# Patient Record
Sex: Female | Born: 1965 | Race: Black or African American | Hispanic: No | Marital: Single | Smoking: Current every day smoker
Health system: Southern US, Community
[De-identification: ages and names within clinical notes are randomized; demographics above are authoritative.]

## PROBLEM LIST (undated history)

## (undated) DIAGNOSIS — R32 Unspecified urinary incontinence: Secondary | ICD-10-CM

## (undated) DIAGNOSIS — N3000 Acute cystitis without hematuria: Secondary | ICD-10-CM

## (undated) DIAGNOSIS — F32A Depression, unspecified: Secondary | ICD-10-CM

## (undated) DIAGNOSIS — M1990 Unspecified osteoarthritis, unspecified site: Secondary | ICD-10-CM

## (undated) DIAGNOSIS — F329 Major depressive disorder, single episode, unspecified: Secondary | ICD-10-CM

## (undated) DIAGNOSIS — I639 Cerebral infarction, unspecified: Secondary | ICD-10-CM

## (undated) DIAGNOSIS — R159 Full incontinence of feces: Secondary | ICD-10-CM

## (undated) DIAGNOSIS — E119 Type 2 diabetes mellitus without complications: Secondary | ICD-10-CM

## (undated) DIAGNOSIS — R0600 Dyspnea, unspecified: Secondary | ICD-10-CM

## (undated) DIAGNOSIS — I10 Essential (primary) hypertension: Secondary | ICD-10-CM

## (undated) DIAGNOSIS — F419 Anxiety disorder, unspecified: Secondary | ICD-10-CM

## (undated) HISTORY — PX: NO PAST SURGERIES: SHX2092

---

## 2002-02-20 DIAGNOSIS — I639 Cerebral infarction, unspecified: Secondary | ICD-10-CM

## 2002-02-20 HISTORY — DX: Cerebral infarction, unspecified: I63.9

## 2005-12-05 ENCOUNTER — Observation Stay: Payer: Self-pay

## 2006-01-12 ENCOUNTER — Observation Stay: Payer: Self-pay | Admitting: Obstetrics and Gynecology

## 2006-02-02 ENCOUNTER — Inpatient Hospital Stay: Payer: Self-pay | Admitting: Obstetrics and Gynecology

## 2010-12-12 ENCOUNTER — Emergency Department: Payer: Self-pay | Admitting: Emergency Medicine

## 2012-01-12 ENCOUNTER — Emergency Department: Payer: Self-pay | Admitting: Emergency Medicine

## 2012-01-16 ENCOUNTER — Emergency Department: Payer: Self-pay | Admitting: Emergency Medicine

## 2013-11-16 ENCOUNTER — Emergency Department: Payer: Self-pay | Admitting: Emergency Medicine

## 2015-03-11 ENCOUNTER — Emergency Department
Admission: EM | Admit: 2015-03-11 | Discharge: 2015-03-11 | Disposition: A | Discharge status: Home / Self Care | Payer: Medicaid Other | Attending: Emergency Medicine | Admitting: Emergency Medicine

## 2015-03-11 ENCOUNTER — Encounter: Payer: Self-pay | Admitting: Emergency Medicine

## 2015-03-11 DIAGNOSIS — I10 Essential (primary) hypertension: Secondary | ICD-10-CM | POA: Insufficient documentation

## 2015-03-11 DIAGNOSIS — E119 Type 2 diabetes mellitus without complications: Secondary | ICD-10-CM | POA: Insufficient documentation

## 2015-03-11 DIAGNOSIS — M170 Bilateral primary osteoarthritis of knee: Secondary | ICD-10-CM

## 2015-03-11 DIAGNOSIS — M25561 Pain in right knee: Secondary | ICD-10-CM | POA: Diagnosis present

## 2015-03-11 DIAGNOSIS — F17200 Nicotine dependence, unspecified, uncomplicated: Secondary | ICD-10-CM | POA: Insufficient documentation

## 2015-03-11 HISTORY — DX: Essential (primary) hypertension: I10

## 2015-03-11 HISTORY — DX: Type 2 diabetes mellitus without complications: E11.9

## 2015-03-11 MED ORDER — MELOXICAM 15 MG PO TABS: 15.0000 mg | ORAL_TABLET | Freq: Every day | ORAL | Status: DC

## 2015-03-11 NOTE — ED Notes (Signed)
States she felt her right knee today .I have been unable to reach this patient by phone.  A letter is being sent. To bear wt

## 2015-03-11 NOTE — ED Notes (Signed)
Pt reports pain in her right knee x 2 weeks. States "it has turned" and pain has been constant. States "I have no cartilage in it." Her pcp sent her to [facility] and she had an injection in the summer that did nothing but make leg, face, knee numb, so she has not been back. Pt has appt with pcp on 1/30 but pain is too much to handle right now.

## 2015-03-11 NOTE — Discharge Instructions (Signed)
Osteoarthritis °Osteoarthritis is a disease that causes soreness and inflammation of a joint. It occurs when the cartilage at the affected joint wears down. Cartilage acts as a cushion, covering the ends of bones where they meet to form a joint. Osteoarthritis is the most common form of arthritis. It often occurs in older people. The joints affected most often by this condition include those in the: °· Ends of the fingers. °· Thumbs. °· Neck. °· Lower back. °· Knees. °· Hips. °CAUSES  °Over time, the cartilage that covers the ends of bones begins to wear away. This causes bone to rub on bone, producing pain and stiffness in the affected joints.  °RISK FACTORS °Certain factors can increase your chances of having osteoarthritis, including: °· Older age. °· Excessive body weight. °· Overuse of joints. °· Previous joint injury. °SIGNS AND SYMPTOMS  °· Pain, swelling, and stiffness in the joint. °· Over time, the joint may lose its normal shape. °· Small deposits of bone (osteophytes) may grow on the edges of the joint. °· Bits of bone or cartilage can break off and float inside the joint space. This may cause more pain and damage. °DIAGNOSIS  °Your health care provider will do a physical exam and ask about your symptoms. Various tests may be ordered, such as: °· X-rays of the affected joint. °· Blood tests to rule out other types of arthritis. °Additional tests may be used to diagnose your condition. °TREATMENT  °Goals of treatment are to control pain and improve joint function. Treatment plans may include: °· A prescribed exercise program that allows for rest and joint relief. °· A weight control plan. °· Pain relief techniques, such as: °· Properly applied heat and cold. °· Electric pulses delivered to nerve endings under the skin (transcutaneous electrical nerve stimulation [TENS]). °· Massage. °· Certain nutritional supplements. °· Medicines to control pain, such as: °· Acetaminophen. °· Nonsteroidal  anti-inflammatory drugs (NSAIDs), such as naproxen. °· Narcotic or central-acting agents, such as tramadol. °· Corticosteroids. These can be given orally or as an injection. °· Surgery to reposition the bones and relieve pain (osteotomy) or to remove loose pieces of bone and cartilage. Joint replacement may be needed in advanced states of osteoarthritis. °HOME CARE INSTRUCTIONS  °· Take medicines only as directed by your health care provider. °· Maintain a healthy weight. Follow your health care provider's instructions for weight control. This may include dietary instructions. °· Exercise as directed. Your health care provider can recommend specific types of exercise. These may include: °¨ Strengthening exercises. These are done to strengthen the muscles that support joints affected by arthritis. They can be performed with weights or with exercise bands to add resistance. °¨ Aerobic activities. These are exercises, such as brisk walking or low-impact aerobics, that get your heart pumping. °¨ Range-of-motion activities. These keep your joints limber. °¨ Balance and agility exercises. These help you maintain daily living skills. °· Rest your affected joints as directed by your health care provider. °· Keep all follow-up visits as directed by your health care provider. °SEEK MEDICAL CARE IF:  °· Your skin turns red. °· You develop a rash in addition to your joint pain. °· You have worsening joint pain. °· You have a fever along with joint or muscle aches. °SEEK IMMEDIATE MEDICAL CARE IF: °· You have a significant loss of weight or appetite. °· You have night sweats. °FOR MORE INFORMATION  °· National Institute of Arthritis and Musculoskeletal and Skin Diseases: www.niams.nih.gov °· National Institute on   Aging: www.nia.nih.gov °· American College of Rheumatology: www.rheumatology.org °  °This information is not intended to replace advice given to you by your health care provider. Make sure you discuss any questions you  have with your health care provider. °  °Document Released: 02/06/2005 Document Revised: 02/27/2014 Document Reviewed: 10/14/2012 °Elsevier Interactive Patient Education ©2016 Elsevier Inc. °Knee Pain °Knee pain is a very common symptom and can have many causes. Knee pain often goes away when you follow your health care provider's instructions for relieving pain and discomfort at home. However, knee pain can develop into a condition that needs treatment. Some conditions may include: °· Arthritis caused by wear and tear (osteoarthritis). °· Arthritis caused by swelling and irritation (rheumatoid arthritis or gout). °· A cyst or growth in your knee. °· An infection in your knee joint. °· An injury that will not heal. °· Damage, swelling, or irritation of the tissues that support your knee (torn ligaments or tendinitis). °If your knee pain continues, additional tests may be ordered to diagnose your condition. Tests may include X-rays or other imaging studies of your knee. You may also need to have fluid removed from your knee. Treatment for ongoing knee pain depends on the cause, but treatment may include: °· Medicines to relieve pain or swelling. °· Steroid injections in your knee. °· Physical therapy. °· Surgery. °HOME CARE INSTRUCTIONS °· Take medicines only as directed by your health care provider. °· Rest your knee and keep it raised (elevated) while you are resting. °· Do not do things that cause or worsen pain. °· Avoid high-impact activities or exercises, such as running, jumping rope, or doing jumping jacks. °· Apply ice to the knee area: °¨ Put ice in a plastic bag. °¨ Place a towel between your skin and the bag. °¨ Leave the ice on for 20 minutes, 2-3 times a day. °· Ask your health care provider if you should wear an elastic knee support. °· Keep a pillow under your knee when you sleep. °· Lose weight if you are overweight. Extra weight can put pressure on your knee. °· Do not use any tobacco products,  including cigarettes, chewing tobacco, or electronic cigarettes. If you need help quitting, ask your health care provider. Smoking may slow the healing of any bone and joint problems that you may have. °SEEK MEDICAL CARE IF: °· Your knee pain continues, changes, or gets worse. °· You have a fever along with knee pain. °· Your knee buckles or locks up. °· Your knee becomes more swollen. °SEEK IMMEDIATE MEDICAL CARE IF:  °· Your knee joint feels hot to the touch. °· You have chest pain or trouble breathing. °  °This information is not intended to replace advice given to you by your health care provider. Make sure you discuss any questions you have with your health care provider. °  °Document Released: 12/04/2006 Document Revised: 02/27/2014 Document Reviewed: 09/22/2013 °Elsevier Interactive Patient Education ©2016 Elsevier Inc. ° °

## 2015-03-11 NOTE — ED Provider Notes (Signed)
[facility] Emergency Department Provider Note  ____________________________________________  Time seen: Approximately 1:35 PM  I have reviewed the triage vital signs and the nursing notes.   HISTORY  Chief Complaint Knee Pain    HPI [name] [name] is a 50 y.o. female who presents emergency Department with right knee pain. 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 She was supposed to be scheduled for a knee replacement surgery but has not followed through with same. She states that she has had multiple imaging studies that reveal that she has little to no cartilage between both of her knees. She states that her right knee is now hurting. She denies any injury precipitating this increase in pain. She states that she takes ibuprofen occasionally for pain but states that it is not helping at this time. She has a primary care provider appointment in 10 days states that she could not wait that long.   Past Medical History  Diagnosis Date  . Hypertension   . Diabetes mellitus without complication (HCC)     There are no active problems to display for this patient.   History reviewed. No pertinent past surgical history.  Current Outpatient Rx  Name  Route  Sig  Dispense  Refill  . meloxicam (MOBIC) 15 MG tablet   Oral   Take 1 tablet (15 mg total) by mouth daily.   30 tablet   0     Allergies Review of patient's allergies indicates no known allergies.  No family history on file.  Social History Social History  Substance Use Topics  . Smoking status: Current Every Day Smoker  . Smokeless tobacco: None  . Alcohol Use: No     Review of Systems  Constitutional: No fever/chills Eyes: No visual changes. No discharge ENT: No sore throat. Cardiovascular: no chest pain. Respiratory: no cough. No SOB. Gastrointestinal: No abdominal pain.  No nausea, no vomiting.  No diarrhea.  No constipation. Genitourinary: Negative for  dysuria. No hematuria Musculoskeletal: Negative for back pain. Positive for right knee pain. Skin: Negative for rash. Neurological: Negative for headaches, focal weakness or numbness. 10-point ROS otherwise negative.  ____________________________________________   PHYSICAL EXAM:  VITAL SIGNS: ED Triage Vitals  Enc Vitals Group     BP 03/11/15 1209 171/97 mmHg     Pulse Rate 03/11/15 1209 84     Resp 03/11/15 1209 16     Temp 03/11/15 1209 98.1 F (36.7 C)     Temp Source 03/11/15 1209 Oral     SpO2 03/11/15 1209 97 %     Weight 03/11/15 1209 240 lb (108.863 kg)     Height 03/11/15 1209  (1.727 m)     Head Cir --      Peak Flow --      Pain Score 03/11/15 1205 8     Pain Loc --      Pain Edu? --      Excl. in GC? --      Constitutional: Alert and oriented. Well appearing and in no acute distress. Eyes: Conjunctivae are normal. PERRL. EOMI. Head: Atraumatic. ENT:      Ears:       Nose: No congestion/rhinnorhea.      Mouth/Throat: Mucous membranes are moist.  Neck: No stridor.   Hematological/Lymphatic/Immunilogical: No cervical lymphadenopathy. Cardiovascular: Normal rate, regular rhythm. Normal S1 and S2.  Good peripheral circulation. Respiratory: Normal respiratory effort without tachypnea or retractions. Lungs CTAB. Gastrointestinal: Soft and nontender.  No distention. No CVA tenderness. Musculoskeletal: No lower extremity tenderness nor edema.  No joint effusions. No visible deformity to inspection bilateral knees. Patient is tender to palpation bilateral joint line. No ballottement. Negative varus, valgus, Lachman's. Neurologic:  Normal speech and language. No gross focal neurologic deficits are appreciated.  Skin:  Skin is warm, dry and intact. No rash noted. Psychiatric: Mood and affect are normal. Speech and behavior are normal. Patient exhibits appropriate insight and judgement.   ____________________________________________   LABS (all labs ordered are  listed, but only abnormal results are displayed)  Labs Reviewed - No data to display ____________________________________________  EKG   ____________________________________________  RADIOLOGY   No results found.  ____________________________________________    PROCEDURES  Procedure(s) performed:       Medications - No data to display   ____________________________________________   INITIAL IMPRESSION / ASSESSMENT AND PLAN / ED COURSE  Pertinent labs & imaging results that were available during my care of the patient were reviewed by me and considered in my medical decision making (see chart for details).  Patient's diagnosis is consistent with osteoarthritis with increased pain. Patient will be discharged home with prescriptions for anti-inflammatories. Patient is to follow up with orthopedics if symptoms persist past this treatment course. Patient is given ED precautions to return to the ED for any worsening or new symptoms.     ____________________________________________  FINAL CLINICAL IMPRESSION(S) / ED DIAGNOSES  Final diagnoses:  Osteoarthritis of both knees, unspecified osteoarthritis type  Knee pain, acute, right      NEW MEDICATIONS STARTED DURING THIS VISIT:  Discharge Medication List as of 03/11/2015  1:37 PM    START taking these medications   Details  meloxicam (MOBIC) 15 MG tablet Take 1 tablet (15 mg total) by mouth daily., Starting 03/11/2015, Until Discontinued, Print            Racheal Patches, PA-C 03/11/15 1500  Rockne Menghini, MD 03/11/15 1527

## 2015-03-17 ENCOUNTER — Other Ambulatory Visit: Payer: Self-pay | Admitting: Orthopedic Surgery

## 2015-03-17 DIAGNOSIS — M1711 Unilateral primary osteoarthritis, right knee: Secondary | ICD-10-CM

## 2015-03-29 ENCOUNTER — Ambulatory Visit
Admission: RE | Admit: 2015-03-29 | Discharge: 2015-03-29 | Disposition: A | Discharge status: Home / Self Care | Payer: Medicaid Other | Source: Ambulatory Visit | Attending: Orthopedic Surgery | Admitting: Orthopedic Surgery

## 2015-03-29 DIAGNOSIS — Z01818 Encounter for other preprocedural examination: Secondary | ICD-10-CM | POA: Insufficient documentation

## 2015-03-29 DIAGNOSIS — M1711 Unilateral primary osteoarthritis, right knee: Secondary | ICD-10-CM | POA: Insufficient documentation

## 2015-04-14 ENCOUNTER — Other Ambulatory Visit: Payer: Medicaid Other

## 2015-04-21 ENCOUNTER — Inpatient Hospital Stay: Admission: RE | Admit: 2015-04-21 | Payer: Medicaid Other | Source: Ambulatory Visit

## 2015-04-28 ENCOUNTER — Encounter
Admission: RE | Admit: 2015-04-28 | Discharge: 2015-04-28 | Disposition: A | Discharge status: Home / Self Care | Payer: Medicaid Other | Source: Ambulatory Visit | Attending: Orthopedic Surgery | Admitting: Orthopedic Surgery

## 2015-04-28 DIAGNOSIS — Z01812 Encounter for preprocedural laboratory examination: Secondary | ICD-10-CM | POA: Diagnosis not present

## 2015-04-28 DIAGNOSIS — Z01810 Encounter for preprocedural cardiovascular examination: Secondary | ICD-10-CM | POA: Insufficient documentation

## 2015-04-28 HISTORY — DX: Anxiety disorder, unspecified: F41.9

## 2015-04-28 HISTORY — DX: Cerebral infarction, unspecified: I63.9

## 2015-04-28 HISTORY — DX: Unspecified osteoarthritis, unspecified site: M19.90

## 2015-04-28 LAB — BASIC METABOLIC PANEL
ANION GAP: 9 (ref 5–15)
BUN: 16 mg/dL (ref 6–20)
CHLORIDE: 105 mmol/L (ref 101–111)
CO2: 25 mmol/L (ref 22–32)
Calcium: 9.1 mg/dL (ref 8.9–10.3)
Creatinine, Ser: 0.84 mg/dL (ref 0.44–1.00)
GFR calc Af Amer: >60 mL/min (ref >60)
GFR calc non Af Amer: >60 mL/min (ref >60)
GLUCOSE: 148 mg/dL — AB (ref 65–99)
POTASSIUM: 3.6 mmol/L (ref 3.5–5.1)
Sodium: 139 mmol/L (ref 135–145)

## 2015-04-28 LAB — TYPE AND SCREEN
ABO/RH(D): A POS
ANTIBODY SCREEN: NEGATIVE

## 2015-04-28 LAB — ABO/RH: ABO/RH(D): A POS

## 2015-04-28 LAB — CBC
HEMATOCRIT: 41.4 % (ref 35.0–47.0)
HEMOGLOBIN: 13.8 g/dL (ref 12.0–16.0)
MCH: 31.1 pg (ref 26.0–34.0)
MCHC: 33.4 g/dL (ref 32.0–36.0)
MCV: 93.2 fL (ref 80.0–100.0)
Platelets: 253 10*3/uL (ref 150–440)
RBC: 4.44 MIL/uL (ref 3.80–5.20)
RDW: 14.1 % (ref 11.5–14.5)
WBC: 6 10*3/uL (ref 3.6–11.0)

## 2015-04-28 LAB — URINALYSIS COMPLETE WITH MICROSCOPIC (ARMC ONLY)
Bilirubin Urine: NEGATIVE
Glucose, UA: NEGATIVE mg/dL
Hgb urine dipstick: NEGATIVE
Ketones, ur: NEGATIVE mg/dL
Leukocytes, UA: NEGATIVE
Nitrite: NEGATIVE
PH: 5 (ref 5.0–8.0)
PROTEIN: 30 mg/dL — AB
SPECIFIC GRAVITY, URINE: 1.024 (ref 1.005–1.030)

## 2015-04-28 LAB — APTT: APTT: 34 s (ref 24–36)

## 2015-04-28 LAB — PROTIME-INR
INR: 1.13
Prothrombin Time: 14.7 seconds (ref 11.4–15.0)

## 2015-04-28 LAB — SURGICAL PCR SCREEN
MRSA, PCR: NEGATIVE
STAPHYLOCOCCUS AUREUS: NEGATIVE

## 2015-04-28 LAB — SEDIMENTATION RATE: Sed Rate: 29 mm/hr — ABNORMAL HIGH (ref 0–20)

## 2015-04-28 NOTE — Pre-Procedure Instructions (Signed)
AS INSTRUCTED BY DR P CARROLL, [facility]QUEST FOR CLEARANCE/EKG CALLED AND FAXEDTO DR OLMEDO. SPOKE WITH ALICE. [facility] SPOKE WITH LINDA

## 2015-04-28 NOTE — Patient Instructions (Addendum)
  Your procedure is scheduled on: Tuesday May 11, 2015. Report to Same Day Surgery. To find out your arrival time please call [phone] between 1PM - 3PM on Monday May 10, 2015.  Remember: Instructions that are not followed completely may result in serious medical risk, up to and including death, or upon the discretion of your surgeon and anesthesiologist your surgery may need to be rescheduled.    _x___ 1. Do not eat food or drink liquids after midnight. No gum chewing or hard candies.     ____ 2. No Alcohol for 24 hours before or after surgery.   ____ 3. Bring all medications with you on the day of surgery if instructed.    __x__ 4. Notify your doctor if there is any change in your medical condition     (cold, fever, infections).     Do not wear jewelry, make-up, hairpins, clips or nail polish.  Do not wear lotions, powders, or perfumes. You may wear deodorant.  Do not shave 48 hours prior to surgery. Men may shave face and neck.  Do not bring valuables to the hospital.    [facility] not [facility] for any belongings or valuables.               Contacts, dentures or bridgework may not be worn into surgery.  Leave your suitcase in the car. After surgery it may be brought to your room.  For patients admitted to the hospital, discharge time is determined by your treatment team.   Patients discharged the day of surgery will not be allowed to drive home.    Please read over the following fact sheets that you were given:   [facility]  __x__ Take these medicines the morning of surgery with A SIP OF WATER:    1. lisinopril (PRINIVIL,ZESTRIL)    ____ Fleet Enema (as directed)   __x__ Use CHG Soap as directed  ____ Use inhalers on the day of surgery  __x_ Stop metformin 2 days prior to surgery    __x_ Take 1/2 of usual insulin dose the night before surgery and none on the morning of surgery.   _x__ Call Dr. Zada Finderslmedo and ask if and when to stop  aspirin prior to knee replacement surgery and prescription for blood sugar machine.   _x__ Stop Anti-inflammatories meloxicam (MOBIC)  on 05/04/2015.  OK to take Tylenol for pain.   ____ Stop supplements until after surgery.    ____ Bring C-Pap to the hospital.

## 2015-04-29 LAB — URINE CULTURE
Culture: NO GROWTH
Special Requests: NORMAL

## 2015-05-02 LAB — LATEX, IGE: Latex: <0.1 kU/L

## 2015-05-06 NOTE — Pre-Procedure Instructions (Addendum)
SPOKE WITH DARLENE RE STATUS OF CLEARANCE BY DR OLMEDO. PATIENT SEEN BY HIM 05/05/15 SEEN BY DR CALLWOOD TODAY 05/06/15

## 2015-05-10 NOTE — Pre-Procedure Instructions (Signed)
CANCELLED FOR 05/11/15. FOR STRESS/ECHO WITH DR Juliann ParesALLWOOD 06/09/15

## 2015-05-11 ENCOUNTER — Inpatient Hospital Stay: Admission: RE | Admit: 2015-05-11 | Payer: Medicaid Other | Source: Ambulatory Visit | Admitting: Orthopedic Surgery

## 2015-05-11 ENCOUNTER — Encounter: Admission: RE | Payer: Self-pay | Source: Ambulatory Visit

## 2015-05-11 SURGERY — ARTHROPLASTY, KNEE, TOTAL
Anesthesia: Choice | Laterality: Right

## 2015-08-04 ENCOUNTER — Emergency Department: Payer: Medicaid Other

## 2015-08-04 ENCOUNTER — Emergency Department
Admission: EM | Admit: 2015-08-04 | Discharge: 2015-08-04 | Disposition: A | Discharge status: Home / Self Care | Payer: Medicaid Other | Attending: Emergency Medicine | Admitting: Emergency Medicine

## 2015-08-04 ENCOUNTER — Encounter: Payer: Self-pay | Admitting: Emergency Medicine

## 2015-08-04 DIAGNOSIS — E119 Type 2 diabetes mellitus without complications: Secondary | ICD-10-CM | POA: Diagnosis not present

## 2015-08-04 DIAGNOSIS — M1990 Unspecified osteoarthritis, unspecified site: Secondary | ICD-10-CM | POA: Diagnosis not present

## 2015-08-04 DIAGNOSIS — Y92810 Car as the place of occurrence of the external cause: Secondary | ICD-10-CM | POA: Diagnosis not present

## 2015-08-04 DIAGNOSIS — S8991XA Unspecified injury of right lower leg, initial encounter: Secondary | ICD-10-CM | POA: Diagnosis present

## 2015-08-04 DIAGNOSIS — S80211A Abrasion, right knee, initial encounter: Secondary | ICD-10-CM

## 2015-08-04 DIAGNOSIS — Y939 Activity, unspecified: Secondary | ICD-10-CM | POA: Diagnosis not present

## 2015-08-04 DIAGNOSIS — Z8673 Personal history of transient ischemic attack (TIA), and cerebral infarction without residual deficits: Secondary | ICD-10-CM | POA: Diagnosis not present

## 2015-08-04 DIAGNOSIS — Z7984 Long term (current) use of oral hypoglycemic drugs: Secondary | ICD-10-CM | POA: Insufficient documentation

## 2015-08-04 DIAGNOSIS — Z7982 Long term (current) use of aspirin: Secondary | ICD-10-CM | POA: Insufficient documentation

## 2015-08-04 DIAGNOSIS — W1839XA Other fall on same level, initial encounter: Secondary | ICD-10-CM | POA: Diagnosis not present

## 2015-08-04 DIAGNOSIS — I10 Essential (primary) hypertension: Secondary | ICD-10-CM | POA: Insufficient documentation

## 2015-08-04 DIAGNOSIS — F17210 Nicotine dependence, cigarettes, uncomplicated: Secondary | ICD-10-CM | POA: Diagnosis not present

## 2015-08-04 DIAGNOSIS — S8001XA Contusion of right knee, initial encounter: Secondary | ICD-10-CM | POA: Insufficient documentation

## 2015-08-04 DIAGNOSIS — Y999 Unspecified external cause status: Secondary | ICD-10-CM | POA: Insufficient documentation

## 2015-08-04 DIAGNOSIS — Z91040 Latex allergy status: Secondary | ICD-10-CM | POA: Insufficient documentation

## 2015-08-04 DIAGNOSIS — Z794 Long term (current) use of insulin: Secondary | ICD-10-CM | POA: Diagnosis not present

## 2015-08-04 MED ORDER — BACITRACIN-NEOMYCIN-POLYMYXIN 400-5-5000 EX OINT: TOPICAL_OINTMENT | Freq: Once | CUTANEOUS | Status: DC

## 2015-08-04 MED ORDER — NAPROXEN 500 MG PO TABS
500.0000 mg | ORAL_TABLET | Freq: Two times a day (BID) | ORAL | Status: DC
Start: 2015-08-04 — End: 2016-02-02

## 2015-08-04 NOTE — Discharge Instructions (Signed)
Abrasion An abrasion is a cut or scrape on the surface of your skin. An abrasion does not go through all of the layers of your skin. It is important to take good care of your abrasion to prevent infection. HOME CARE Medicines  Take or apply medicines only as told by your doctor.  If you were prescribed an antibiotic ointment, finish all of it even if you start to feel better. Wound Care  Clean the wound with mild soap and water 2-3 times per day or as told by your doctor. Pat your wound dry with a clean towel. Do not rub it.  There are many ways to close and cover a wound. Follow instructions from your doctor about:  How to take care of your wound.  When and how you should change your bandage (dressing).  When and how you should take off your dressing.  Check your wound every day for signs of infection. Watch for:  Redness, swelling, or pain.  Fluid, blood, or pus. General Instructions  Keep the dressing dry as told by your doctor. Do not take baths, swim, use a hot tub, or do anything that would put your wound underwater until your doctor says it is okay.  If there is swelling, raise (elevate) the injured area above the level of your heart while you are sitting or lying down.  Keep all follow-up visits as told by your doctor. This is important. GET HELP IF:  You were given a tetanus shot and you have any of these where the needle went in:  Swelling.  Very bad pain.  Redness.  Bleeding.  Medicine does not help your pain.  You have any of these at the site of the wound:  More redness.  More swelling.  More pain. GET HELP RIGHT AWAY IF:  You have a red streak going away from your wound.  You have a fever.  You have fluid, blood, or pus coming from your wound.  There is a bad smell coming from your wound.   This information is not intended to replace advice given to you by your health care provider. Make sure you discuss any questions you have with your  health care provider.   Document Released: 07/26/2007 Document Revised: 06/23/2014 Document Reviewed: 02/04/2014 Elsevier Interactive Patient Education 2016 Elsevier Inc.  Contusion A contusion is a deep bruise. Contusions happen when an injury causes bleeding under the skin. Symptoms of bruising include pain, swelling, and discolored skin. The skin may turn blue, purple, or yellow. HOME CARE   Rest the injured area.  If told, put ice on the injured area.  Put ice in a plastic bag.  Place a towel between your skin and the bag.  Leave the ice on for 20 minutes, 2-3 times per day.  If told, put light pressure (compression) on the injured area using an elastic bandage. Make sure the bandage is not too tight. Remove it and put it back on as told by your doctor.  If possible, raise (elevate) the injured area above the level of your heart while you are sitting or lying down.  Take over-the-counter and prescription medicines only as told by your doctor. GET HELP IF:  Your symptoms do not get better after several days of treatment.  Your symptoms get worse.  You have trouble moving the injured area. GET HELP RIGHT AWAY IF:   You have very bad pain.  You have a loss of feeling (numbness) in a hand or foot.  Your hand or foot turns pale or cold.   This information is not intended to replace advice given to you by your health care provider. Make sure you discuss any questions you have with your health care provider.   Document Released: 07/26/2007 Document Revised: 10/28/2014 Document Reviewed: 06/24/2014 Elsevier Interactive Patient Education 2016 Elsevier Inc.  Cryotherapy Cryotherapy is when you put ice on your injury. Ice helps lessen pain and puffiness (swelling) after an injury. Ice works the best when you start using it in the first 24 to 48 hours after an injury. HOME CARE  Put a dry or damp towel between the ice pack and your skin.  You may press gently on the ice  pack.  Leave the ice on for no more than 10 to 20 minutes at a time.  Check your skin after 5 minutes to make sure your skin is okay.  Rest at least 20 minutes between ice pack uses.  Stop using ice when your skin loses feeling (numbness).  Do not use ice on someone who cannot tell you when it hurts. This includes small children and people with memory problems (dementia). GET HELP RIGHT AWAY IF:  You have white spots on your skin.  Your skin turns blue or pale.  Your skin feels waxy or hard.  Your puffiness gets worse. MAKE SURE YOU:   Understand these instructions.  Will watch your condition.  Will get help right away if you are not doing well or get worse.   This information is not intended to replace advice given to you by your health care provider. Make sure you discuss any questions you have with your health care provider.   Document Released: 07/26/2007 Document Revised: 05/01/2011 Document Reviewed: 09/29/2010 Elsevier Interactive Patient Education [employer] Elsevier Inc.

## 2015-08-04 NOTE — ED Notes (Signed)
Pt presents to ED with right knee pain. Pt states fell getting into the car last night. Pt denies hitting head, denies any LOC. Pt states has not taken blood pressure medication today.

## 2015-08-04 NOTE — ED Notes (Signed)
States she tripped last pm  Landed on right knee. Having some swelling   No deformity noted  Unable to bear wt d/t increased pain

## 2015-08-04 NOTE — ED Provider Notes (Signed)
[facility] Emergency Department Provider Note  ____________________________________________  Time seen: Approximately 2:46 PM  I have reviewed the triage vital signs and the nursing notes.   HISTORY  Chief Complaint Fall and Knee Pain    HPI [name] [name] is a 50 y.o. female , NAD, presents to the emergency department with one-day history of right knee pain and swelling. States she tripped last night and landed on her right knee. Has an abrasion to the area in which she is covered with a bandage. Has had pain and swelling about the knee since the incident last night but is not taking anything for her pain. States she has chronic history of arthritis in which she needs to have a total knee replacement but has not scheduled that surgery at this time. Is seen in orthopedics by Dr. Rosita Kea as well as [place]ighbor for her knee arthritis. Denies any numbness, weakness, tingling. Has not had any chest pain, shortness of breath, visual changes, headache. Does note that she has not taken her antihypertensives today.   Past Medical History  Diagnosis Date  . Hypertension   . Diabetes mellitus without complication (HCC)   . Anxiety   . Arthritis   . Stroke [facility]) 2004    x 2    There are no active problems to display for this patient.   Past Surgical History  Procedure Laterality Date  . No past surgeries      Current Outpatient Rx  Name  Route  Sig  Dispense  Refill  . aspirin 81 MG tablet   Oral   Take 81 mg by mouth every morning.         . glyBURIDE (DIABETA) 5 MG tablet   Oral   Take 10 mg by mouth 2 (two) times daily with a meal.         . insulin glargine (LANTUS) 100 UNIT/ML injection   Subcutaneous   Inject 65 Units into the skin at bedtime.         Marland Kitchen lisinopril (PRINIVIL,ZESTRIL) 20 MG tablet   Oral   Take 20 mg by mouth every morning.         . metFORMIN (GLUCOPHAGE) 500 MG tablet   Oral   Take 1,000 mg by mouth daily after  supper. 2, 000 mg at supper.         . naproxen (NAPROSYN) 500 MG tablet   Oral   Take 1 tablet (500 mg total) by mouth 2 (two) times daily with a meal.   14 tablet   0     Allergies Latex  No family history on file.  Social History Social History  Substance Use Topics  . Smoking status: Current Every Day Smoker -- 1.00 packs/day    Types: Cigarettes  . Smokeless tobacco: None  . Alcohol Use: No     Review of Systems  Constitutional: No fever/chills, Fatigue Eyes: No visual changes.  Cardiovascular: No chest pain. Respiratory: No shortness of breath. No wheezing.  Gastrointestinal: No abdominal pain.  No nausea, vomiting.   Musculoskeletal: Positive right knee pain. Negative for neck, back pain Skin: Positive abrasion right knee. Negative for rash. Neurological: Negative for headaches, focal weakness or numbness. No tingling. No saddle paresthesias. 10-point ROS otherwise negative.  ____________________________________________   PHYSICAL EXAM:  VITAL SIGNS: ED Triage Vitals  Enc Vitals Group     BP 08/04/15 1332 167/102 mmHg     Pulse Rate 08/04/15 1332 87  Resp 08/04/15 1332 18     Temp 08/04/15 1332 98.1 F (36.7 C)     Temp Source 08/04/15 1332 Oral     SpO2 08/04/15 1332 98 %     Weight 08/04/15 1332 245 lb (111.131 kg)     Height 08/04/15 1332 5\' 8"  (1.727 m)     Head Cir --      Peak Flow --      Pain Score 08/04/15 1333 9     Pain Loc --      Pain Edu? --      Excl. in GC? --      Constitutional: Alert and oriented. Well appearing and in no acute distress. Eyes: Conjunctivae are normal. PERRL.  Head: Atraumatic. Cardiovascular: Normal rate, regular rhythm. Normal S1 and S2.  Good peripheral circulationWith 2+ pulses in the right lower extremity. Respiratory: Normal respiratory effort without tachypnea or retractions. Lungs CTABWith breath sounds noted in all lung fields. Musculoskeletal: Mild tenderness to palpation about the lateral,  anterior portion of the knee with mild effusion appreciated. No laxity with anterior posterior drawer of the right knee. No laxity with varus or by the stress of the right knee. No lower extremity tenderness nor edema.   Neurologic:  Normal speech and language. No gross focal neurologic deficits are appreciated.  Skin:  Superficial 0.5cmabrasion noted to the anterior portion of the right knee.  Skin is warm, dry. No rash, redness, warmth noted. Psychiatric: Mood and affect are normal. Speech and behavior are normal. Patient exhibits appropriate insight and judgement.   ____________________________________________   LABS  None ____________________________________________  EKG  None ____________________________________________  RADIOLOGY I have personally viewed and evaluated these images (plain radiographs) as part of my medical decision making, as well as reviewing the written report by the radiologist.  Dg Knee Complete 4 Views Right  08/04/2015  CLINICAL DATA:  Tripped last p.m., landed on right knee, right knee swelling EXAM: RIGHT KNEE - COMPLETE 4+ VIEW COMPARISON:  CT of the right knee 03/29/2015 FINDINGS: Four views of the right knee submitted. No acute fracture or subluxation. Again noted significant narrowing of lateral joint compartment. There is mild sclerosis of lateral tibial plateau. Moderate spurring lateral femoral condyle and lateral tibial plateau. Mild spurring of medial tibial plateau. Significant narrowing of patellofemoral joint space. Moderate joint effusion. Spurring of patella. IMPRESSION: No acute fracture or subluxation. Extensive osteoarthritic changes as described above. Moderate joint effusion. Electronically Signed   By: Natasha Mead M.D.   On: 08/04/2015 14:47    ____________________________________________    PROCEDURES  Procedure(s) performed: None   Medications  neomycin-bacitracin-polymyxin (NEOSPORIN) ointment (not administered)      ____________________________________________   INITIAL IMPRESSION / ASSESSMENT AND PLAN / ED COURSE  Pertinent imaging results that were available during my care of the patient were reviewed by me and considered in my medical decision making (see chart for details).  Patient's diagnosis is consistent withContusion of right knee, abrasion right kneeatient will be discharged home with prescriptions forNaproxen to take as directed. Patient's abrasion was covered with Neosporin and a bandage placed. Ace wrap was applied to the left knee for comfort care. Patient is to apply ice to the affected area 20 minutes 3-4 times daily as needed. Should also keep the knee elevated when not ambulating to decrease any swelling.  Patient is to follow up with Dr. Rosita Kea if symptoms persist past this treatment course. Patient is given ED precautions to return to the ED for any  worsening or new symptoms.    ____________________________________________  FINAL CLINICAL IMPRESSION(S) / ED DIAGNOSES  Final diagnoses:  Contusion of right knee, initial encounter  Abrasion of right knee, initial encounter      NEW MEDICATIONS STARTED DURING THIS VISIT:  Discharge Medication List as of 08/04/2015  3:02 PM    START taking these medications   Details  naproxen (NAPROSYN) 500 MG tablet Take 1 tablet (500 mg total) by mouth 2 (two) times daily with a meal., Starting 08/04/2015, Until Discontinued, Print             Hope PigeonJami L Quetzaly Ebner, PA-C 08/04/15 1710  Governor Rooksebecca Lord, MD 08/05/15 1109

## 2015-12-18 ENCOUNTER — Emergency Department
Admission: EM | Admit: 2015-12-18 | Discharge: 2015-12-18 | Disposition: A | Discharge status: Home / Self Care | Payer: Medicaid Other | Attending: Emergency Medicine | Admitting: Emergency Medicine

## 2015-12-18 DIAGNOSIS — I10 Essential (primary) hypertension: Secondary | ICD-10-CM | POA: Diagnosis not present

## 2015-12-18 DIAGNOSIS — Z91040 Latex allergy status: Secondary | ICD-10-CM | POA: Diagnosis not present

## 2015-12-18 DIAGNOSIS — Z794 Long term (current) use of insulin: Secondary | ICD-10-CM | POA: Insufficient documentation

## 2015-12-18 DIAGNOSIS — F17210 Nicotine dependence, cigarettes, uncomplicated: Secondary | ICD-10-CM | POA: Diagnosis not present

## 2015-12-18 DIAGNOSIS — Z79899 Other long term (current) drug therapy: Secondary | ICD-10-CM | POA: Diagnosis not present

## 2015-12-18 DIAGNOSIS — Z7982 Long term (current) use of aspirin: Secondary | ICD-10-CM | POA: Insufficient documentation

## 2015-12-18 DIAGNOSIS — R350 Frequency of micturition: Secondary | ICD-10-CM

## 2015-12-18 DIAGNOSIS — E1165 Type 2 diabetes mellitus with hyperglycemia: Secondary | ICD-10-CM | POA: Diagnosis not present

## 2015-12-18 DIAGNOSIS — Z7984 Long term (current) use of oral hypoglycemic drugs: Secondary | ICD-10-CM | POA: Diagnosis not present

## 2015-12-18 DIAGNOSIS — R32 Unspecified urinary incontinence: Secondary | ICD-10-CM | POA: Diagnosis present

## 2015-12-18 DIAGNOSIS — R739 Hyperglycemia, unspecified: Secondary | ICD-10-CM

## 2015-12-18 LAB — URINALYSIS COMPLETE WITH MICROSCOPIC (ARMC ONLY)
BILIRUBIN URINE: NEGATIVE
Ketones, ur: NEGATIVE mg/dL
LEUKOCYTES UA: NEGATIVE
NITRITE: NEGATIVE
Protein, ur: 30 mg/dL — AB
Specific Gravity, Urine: 1.03 (ref 1.005–1.030)
pH: 6 (ref 5.0–8.0)

## 2015-12-18 LAB — COMPREHENSIVE METABOLIC PANEL
ALBUMIN: 3.4 g/dL — AB (ref 3.5–5.0)
ALK PHOS: 79 U/L (ref 38–126)
ALT: 16 U/L (ref 14–54)
AST: 19 U/L (ref 15–41)
Anion gap: 8 (ref 5–15)
BILIRUBIN TOTAL: 0.3 mg/dL (ref 0.3–1.2)
BUN: 14 mg/dL (ref 6–20)
CALCIUM: 8.7 mg/dL — AB (ref 8.9–10.3)
CO2: 26 mmol/L (ref 22–32)
Chloride: 102 mmol/L (ref 101–111)
Creatinine, Ser: 0.59 mg/dL (ref 0.44–1.00)
GFR calc Af Amer: >60 mL/min (ref >60)
GFR calc non Af Amer: >60 mL/min (ref >60)
GLUCOSE: 311 mg/dL — AB (ref 65–99)
Potassium: 3.6 mmol/L (ref 3.5–5.1)
SODIUM: 136 mmol/L (ref 135–145)
TOTAL PROTEIN: 7.6 g/dL (ref 6.5–8.1)

## 2015-12-18 LAB — CBC WITH DIFFERENTIAL/PLATELET
BASOS ABS: 0 10*3/uL (ref 0–0.1)
BASOS PCT: 1 %
EOS PCT: 3 %
Eosinophils Absolute: 0.2 10*3/uL (ref 0–0.7)
HEMATOCRIT: 42.4 % (ref 35.0–47.0)
Hemoglobin: 14.7 g/dL (ref 12.0–16.0)
Lymphocytes Relative: 36 %
Lymphs Abs: 2.3 10*3/uL (ref 1.0–3.6)
MCH: 31.9 pg (ref 26.0–34.0)
MCHC: 34.6 g/dL (ref 32.0–36.0)
MCV: 92.2 fL (ref 80.0–100.0)
MONO ABS: 0.6 10*3/uL (ref 0.2–0.9)
MONOS PCT: 9 %
NEUTROS ABS: 3.3 10*3/uL (ref 1.4–6.5)
Neutrophils Relative %: 51 %
PLATELETS: 265 10*3/uL (ref 150–440)
RBC: 4.6 MIL/uL (ref 3.80–5.20)
RDW: 13.5 % (ref 11.5–14.5)
WBC: 6.4 10*3/uL (ref 3.6–11.0)

## 2015-12-18 MED ORDER — METFORMIN HCL 500 MG PO TABS: 1000.0000 mg | ORAL_TABLET | Freq: Two times a day (BID) | ORAL | 0 refills | Status: DC

## 2015-12-18 MED ORDER — INSULIN GLARGINE 100 UNIT/ML ~~LOC~~ SOLN: 65.0000 [IU] | Freq: Every day | SUBCUTANEOUS | 0 refills | Status: DC

## 2015-12-18 MED ORDER — GLYBURIDE 5 MG PO TABS: 10.0000 mg | ORAL_TABLET | Freq: Two times a day (BID) | ORAL | 0 refills | Status: DC

## 2015-12-18 NOTE — ED Triage Notes (Signed)
Pt came to ED via EMS c/o uncontrolled bladder for the past month. Pt not compliant with meds. CBG 340. BP 180/110. Pt reports ran out of her meds.

## 2015-12-18 NOTE — ED Notes (Signed)
Patient states that the only reason she came to the ED was because "ive been using the bathroom on myself for 3 months and im living with my cousin and hes tired of it"  No other complaints or concerns.

## 2015-12-18 NOTE — ED Provider Notes (Signed)
[facility] Emergency Department Provider Note  ____________________________________________  Time seen: Approximately 4:33 PM  I have reviewed the triage vital signs and the nursing notes.   HISTORY  Chief Complaint uncontrolled bladder    HPI [name] [name] is a 50 y.o. female complains of urinary incontinence. She states that she's been urinating on herself for the last 3 months because she doesn't have enough time to get to the bathroom when she feels like she needs to P. She is an insulin-dependent diabetic and states that she's run out of her medicine and her blood sugars up and running high as well. He reports it is been in the 300s lately were normal limits about 150. Denies any abdominal pain nausea vomiting diarrhea chest pain or shortness breath. No fevers or chills.     Past Medical History:  Diagnosis Date  . Anxiety   . Arthritis   . Diabetes mellitus without complication (HCC)   . Hypertension   . Stroke [facility](HCC) 2004   x 2     There are no active problems to display for this patient.    Past Surgical History:  Procedure Laterality Date  . NO PAST SURGERIES       Prior to Admission medications   Medication Sig Start Date End Date Taking? Authorizing Provider  aspirin 81 MG tablet Take 81 mg by mouth every morning.    Historical Provider, MD  glyBURIDE (DIABETA) 5 MG tablet Take 2 tablets (10 mg total) by mouth 2 (two) times daily with a meal. 12/18/15   Sharman CheekPhillip Gavyn Zoss, MD  insulin glargine (LANTUS) 100 UNIT/ML injection Inject 0.65 mLs (65 Units total) into the skin at bedtime. 12/18/15   Sharman CheekPhillip Leya Paige, MD  lisinopril (PRINIVIL,ZESTRIL) 20 MG tablet Take 20 mg by mouth every morning.    Historical Provider, MD  metFORMIN (GLUCOPHAGE) 500 MG tablet Take 2 tablets (1,000 mg total) by mouth 2 (two) times daily with a meal. 2, 000 mg at supper. 12/18/15   Sharman CheekPhillip Vesna Kable, MD  naproxen (NAPROSYN) 500 MG tablet Take 1 tablet (500  mg total) by mouth 2 (two) times daily with a meal. 08/04/15   Jami L Hagler, PA-C     Allergies Latex   No family history on file.  Social History Social History  Substance Use Topics  . Smoking status: Current Every Day Smoker    Packs/day: 1.00    Types: Cigarettes  . Smokeless tobacco: Not on file  . Alcohol use No    Review of Systems  Constitutional:   No fever or chills.  Cardiovascular:   No chest pain. Respiratory:   No dyspnea or cough. Gastrointestinal:   Negative for abdominal pain, vomiting and diarrhea.  Genitourinary:   Positive urinary incontinence..  10-point ROS otherwise negative.  ____________________________________________   PHYSICAL EXAM:  VITAL SIGNS: ED Triage Vitals [12/18/15 1612]  Enc Vitals Group     BP (!) 174/92     Pulse Rate 68     Resp      Temp 98.6 F (37 C)     Temp Source Oral     SpO2 98 %     Weight      Height      Head Circumference      Peak Flow      Pain Score      Pain Loc      Pain Edu?      Excl. in GC?     Vital signs reviewed,  nursing assessments reviewed.   Constitutional:   Alert and oriented. Well appearing and in no distress. Eyes:   No scleral icterus. No conjunctival pallor. PERRL. EOMI.  No nystagmus. ENT   Head:   Normocephalic and atraumatic.   Nose:   No congestion/rhinnorhea. No septal hematoma   Mouth/Throat:   Dry mucous membranes, no pharyngeal erythema. No peritonsillar mass.    Neck:   No stridor. No SubQ emphysema. No meningismus. Hematological/Lymphatic/Immunilogical:   No cervical lymphadenopathy. Cardiovascular:   RRR. Symmetric bilateral radial and DP pulses.  No murmurs.  Respiratory:   Normal respiratory effort without tachypnea nor retractions. Breath sounds are clear and equal bilaterally. No wheezes/rales/rhonchi. Gastrointestinal:   Soft and nontender. Non distended. There is no CVA tenderness.  No rebound, rigidity, or guarding. Genitourinary:    deferred Musculoskeletal:   Nontender with normal range of motion in all extremities. No joint effusions.  No lower extremity tenderness.  No edema. Neurologic:   Normal speech and language.  CN 2-10 normal. Motor grossly intact. No gross focal neurologic deficits are appreciated.  Skin:    Skin is warm, dry and intact. No rash noted.  No petechiae, purpura, or bullae.  ____________________________________________    LABS (pertinent positives/negatives) (all labs ordered are listed, but only abnormal results are displayed) Labs Reviewed  COMPREHENSIVE METABOLIC PANEL - Abnormal; Notable for the following:       Result Value   Glucose, Bld 311 (*)    Calcium 8.7 (*)    Albumin 3.4 (*)    All other components within normal limits  URINALYSIS COMPLETEWITH MICROSCOPIC ([facility] ONLY) - Abnormal; Notable for the following:    Color, Urine YELLOW (*)    APPearance HAZY (*)    Glucose, UA >500 (*)    Hgb urine dipstick 3+ (*)    Protein, ur 30 (*)    Bacteria, UA RARE (*)    Squamous Epithelial / LPF 0-5 (*)    All other components within normal limits  URINE CULTURE  CBC WITH DIFFERENTIAL/PLATELET   ____________________________________________   EKG    ____________________________________________    RADIOLOGY    ____________________________________________   PROCEDURES Procedures  ____________________________________________   INITIAL IMPRESSION / ASSESSMENT AND PLAN / ED COURSE  Pertinent labs & imaging results that were available during my care of the patient were reviewed by me and considered in my medical decision making (see chart for details).  Patient well appearing not in distress, vital signs unremarkable. Hyperglycemia with uncontrolled diabetes. We'll check urinalysis and labs. Patient wants to drink and feels thirsty so we'll let her hydrate herself orally.     Clinical Course    ----------------------------------------- 5:39 PM on  12/18/2015 -----------------------------------------  Labs all unremarkable. Patient ambulatory, uncomfortable. Vital signs unremarkable. I'll refill the patient's medications for her diabetes which is glyburide and metformin and Lantus. Follow-up with primary care. She confirms that she sees Dr. Zada Finderslmedo can call for an appointment on Monday. No evidence of acute inflammatory reaction, infection or sepsis, acidosis, or severe dehydration. ____________________________________________   FINAL CLINICAL IMPRESSION(S) / ED DIAGNOSES  Final diagnoses:  Hyperglycemia  Urinary frequency       Portions of this note were generated with dragon dictation software. Dictation errors may occur despite best attempts at proofreading.    Sharman CheekPhillip Savian Mazon, MD 12/18/15 1740

## 2015-12-20 LAB — URINE CULTURE

## 2016-01-18 ENCOUNTER — Encounter: Payer: Self-pay | Admitting: Emergency Medicine

## 2016-01-18 ENCOUNTER — Emergency Department: Payer: Medicaid Other

## 2016-01-18 ENCOUNTER — Emergency Department
Admission: EM | Admit: 2016-01-18 | Discharge: 2016-01-18 | Disposition: A | Discharge status: Home / Self Care | Payer: Medicaid Other | Attending: Emergency Medicine | Admitting: Emergency Medicine

## 2016-01-18 DIAGNOSIS — R32 Unspecified urinary incontinence: Secondary | ICD-10-CM | POA: Diagnosis not present

## 2016-01-18 DIAGNOSIS — R159 Full incontinence of feces: Secondary | ICD-10-CM | POA: Insufficient documentation

## 2016-01-18 DIAGNOSIS — Z91040 Latex allergy status: Secondary | ICD-10-CM | POA: Insufficient documentation

## 2016-01-18 DIAGNOSIS — Z7982 Long term (current) use of aspirin: Secondary | ICD-10-CM | POA: Diagnosis not present

## 2016-01-18 DIAGNOSIS — R739 Hyperglycemia, unspecified: Secondary | ICD-10-CM

## 2016-01-18 DIAGNOSIS — I10 Essential (primary) hypertension: Secondary | ICD-10-CM | POA: Insufficient documentation

## 2016-01-18 DIAGNOSIS — Z794 Long term (current) use of insulin: Secondary | ICD-10-CM | POA: Insufficient documentation

## 2016-01-18 DIAGNOSIS — E1165 Type 2 diabetes mellitus with hyperglycemia: Secondary | ICD-10-CM | POA: Insufficient documentation

## 2016-01-18 DIAGNOSIS — R809 Proteinuria, unspecified: Secondary | ICD-10-CM | POA: Diagnosis not present

## 2016-01-18 DIAGNOSIS — F17210 Nicotine dependence, cigarettes, uncomplicated: Secondary | ICD-10-CM | POA: Insufficient documentation

## 2016-01-18 LAB — COMPREHENSIVE METABOLIC PANEL
ALBUMIN: 3.3 g/dL — AB (ref 3.5–5.0)
ALK PHOS: 67 U/L (ref 38–126)
ALT: 10 U/L — AB (ref 14–54)
ANION GAP: 9 (ref 5–15)
AST: 15 U/L (ref 15–41)
BUN: 14 mg/dL (ref 6–20)
CALCIUM: 9 mg/dL (ref 8.9–10.3)
CO2: 28 mmol/L (ref 22–32)
Chloride: 101 mmol/L (ref 101–111)
Creatinine, Ser: 0.71 mg/dL (ref 0.44–1.00)
GFR calc Af Amer: >60 mL/min (ref >60)
GFR calc non Af Amer: >60 mL/min (ref >60)
GLUCOSE: 203 mg/dL — AB (ref 65–99)
Potassium: 3.4 mmol/L — ABNORMAL LOW (ref 3.5–5.1)
SODIUM: 138 mmol/L (ref 135–145)
Total Bilirubin: 0.4 mg/dL (ref 0.3–1.2)
Total Protein: 7.8 g/dL (ref 6.5–8.1)

## 2016-01-18 LAB — URINALYSIS COMPLETE WITH MICROSCOPIC (ARMC ONLY)
BACTERIA UA: NONE SEEN
Bilirubin Urine: NEGATIVE
HGB URINE DIPSTICK: NEGATIVE
Ketones, ur: NEGATIVE mg/dL
LEUKOCYTES UA: NEGATIVE
Nitrite: NEGATIVE
PH: 6 (ref 5.0–8.0)
Protein, ur: 30 mg/dL — AB
Specific Gravity, Urine: 1.023 (ref 1.005–1.030)

## 2016-01-18 LAB — CBC
HEMATOCRIT: 41.5 % (ref 35.0–47.0)
HEMOGLOBIN: 14.2 g/dL (ref 12.0–16.0)
MCH: 31.6 pg (ref 26.0–34.0)
MCHC: 34.3 g/dL (ref 32.0–36.0)
MCV: 92.3 fL (ref 80.0–100.0)
Platelets: 369 10*3/uL (ref 150–440)
RBC: 4.5 MIL/uL (ref 3.80–5.20)
RDW: 12.7 % (ref 11.5–14.5)
WBC: 7.4 10*3/uL (ref 3.6–11.0)

## 2016-01-18 MED ORDER — NYSTATIN 100000 UNIT/GM EX POWD
Freq: Once | CUTANEOUS | Status: AC
  Administered 2016-01-18: 17:00:00 via TOPICAL
  Filled 2016-01-18: qty 15

## 2016-01-18 MED ORDER — SODIUM CHLORIDE 0.9 % IV BOLUS (SEPSIS)
1000.0000 mL | Freq: Once | INTRAVENOUS | Status: AC
  Administered 2016-01-18: 1000 mL via INTRAVENOUS

## 2016-01-18 MED ORDER — LISINOPRIL 10 MG PO TABS
10.0000 mg | ORAL_TABLET | Freq: Once | ORAL | Status: AC
  Administered 2016-01-18: 10 mg via ORAL
  Filled 2016-01-18: qty 1

## 2016-01-18 NOTE — ED Triage Notes (Signed)
Pt to ed with c/o incontinence of urine, elevated blood sugar and HTN.  Pt states she does not take her meds everyday.

## 2016-01-18 NOTE — ED Provider Notes (Signed)
[facility] Emergency Department Provider Note  ____________________________________________  Time seen: Approximately 4:52 PM  I have reviewed the triage vital signs and the nursing notes.   HISTORY  Chief Complaint Hyperglycemia    HPI [name] [name] is a 50 y.o. female with a history of HTN, DM, CVA, and education noncompliance presenting for urinary fecal incontinence. Patient reports that for the past 3 months, she has been having both urinary and fecal incontinence. "I don't feel it coming in and make a mess." "My cousin made me come because I was making messes all over the house." The patient denies any back pain, intravenous drug abuse, fever, saddle anesthesia, or numbness or tingling. She reports difficulty walking with a cane use that is due to knee pain and is chronic and unchanged. She has not recently been on any antibiotics, and has no known sick contacts. She works as a Biomedical engineerhome aide but does not have known ED if exposure.The patient was seen in the ED 10/28 for urinary incontinence in the setting of hyperglycemia from medication noncompliance.   Past Medical History:  Diagnosis Date  . Anxiety   . Arthritis   . Diabetes mellitus without complication (HCC)   . Hypertension   . Stroke [facility](HCC) 2004   x 2    There are no active problems to display for this patient.   Past Surgical History:  Procedure Laterality Date  . NO PAST SURGERIES      Current Outpatient Rx  . Order #: [id number] Class: Historical Med  . Order #: [id number] Class: Print  . Order #: [id number] Class: Print  . Order #: [id number] Class: Historical Med  . Order #: [id number] Class: Print  . Order #: [id number] Class: Print    Allergies Latex  History reviewed. No pertinent family history.  Social History Social History  Substance Use Topics  . Smoking status: Current Every Day Smoker    Packs/day: 1.00    Types: Cigarettes  . Smokeless tobacco: Never Used  . Alcohol  use No    Review of Systems Constitutional: No fever/chills.No generalized malaise. No trauma. Eyes: No visual changes. ENT: No sore throat. No congestion or rhinorrhea. Cardiovascular: Denies chest pain. Denies palpitations. Respiratory: Denies shortness of breath.  No cough. Gastrointestinal: No abdominal pain.  No nausea, no vomiting.  No diarrhea.  No constipation. Genitourinary: Negative for dysuria. Positive urinary and fecal incontinence. Positive urinary frequency. Musculoskeletal: Negative for back pain. Skin: Negative for rash. Neurological: Negative for headaches. No focal numbness, tingling or weakness. Mild difficulty walking due to knee pain which is unchanged.  10-point ROS otherwise negative.  ____________________________________________   PHYSICAL EXAM:  VITAL SIGNS: ED Triage Vitals  Enc Vitals Group     BP 01/18/16 1557 (!) 150/108     Pulse Rate 01/18/16 1557 90     Resp 01/18/16 1557 20     Temp 01/18/16 1557 98.4 F (36.9 C)     Temp Source 01/18/16 1557 Oral     SpO2 01/18/16 1557 100 %     Weight 01/18/16 1557 260 lb (117.9 kg)     Height --      Head Circumference --      Peak Flow --      Pain Score 01/18/16 1558 5     Pain Loc --      Pain Edu? --      Excl. in GC? --     Constitutional: Alert and oriented. Chronically ill appearing but in no acute distress. Answers  questions appropriately. Eyes: Right conjunctival erythema. Disconjugate gaze EOMI. No scleral icterus. Head: Atraumatic. Nose: No congestion/rhinnorhea. Mouth/Throat: Mucous membranes are moist.  Neck: No stridor.  Supple.  Full range of motion without pain. No midline C-spine tenderness to palpation, step-offs or deformities. Cardiovascular: Normal rate, regular rhythm. No murmurs, rubs or gallops.  Respiratory: Normal respiratory effort.  No accessory muscle use or retractions. Lungs CTAB.  No wheezes, rales or ronchi. Gastrointestinal: Obese. Soft, nontender and  nondistended.  No guarding or rebound.  No peritoneal signs. Genitourinary: No external or palpable internal hemorrhoids. Good rectal tone. No saddle anesthesia. Musculoskeletal: No LE edema.  Neurologic:  A&Ox3.  Speech is clear.  Face and smile are symmetric.  EOMI.  5 out of 5 hip flexion, dorsiflexion and plantarflexion. Normal sensation to light touch throughout the lower extremities bilaterally. Skin:  Skin is warm. Large erythematous rash with white patches on the folds of the right pannus. Psychiatric: The patient answers some questions as if she has cognitive impairment. She is alert and oriented, however, but does not appear to have good insight.  ____________________________________________   LABS (all labs ordered are listed, but only abnormal results are displayed)  Labs Reviewed  COMPREHENSIVE METABOLIC PANEL - Abnormal; Notable for the following:       Result Value   Potassium 3.4 (*)    Glucose, Bld 203 (*)    Albumin 3.3 (*)    ALT 10 (*)    All other components within normal limits  URINALYSIS COMPLETEWITH MICROSCOPIC ([facility] ONLY) - Abnormal; Notable for the following:    Color, Urine YELLOW (*)    APPearance HAZY (*)    Glucose, UA >500 (*)    Protein, ur 30 (*)    Squamous Epithelial / LPF 0-5 (*)    All other components within normal limits  GASTROINTESTINAL PANEL BY PCR, STOOL (REPLACES STOOL CULTURE)  C DIFFICILE QUICK SCREEN W PCR REFLEX  CBC   ____________________________________________  EKG  Not indicated ____________________________________________  RADIOLOGY  Mr Lumbar Spine Wo Contrast  Result Date: 01/18/2016 CLINICAL DATA:  50 y/o F; urinary incontinence, elevated blood sugar, and hypertension. EXAM: MRI LUMBAR SPINE WITHOUT CONTRAST TECHNIQUE: Multiplanar, multisequence MR imaging of the lumbar spine was performed. No intravenous contrast was administered. COMPARISON:  None. FINDINGS: Segmentation:  Standard. Alignment: Normal lumbar  lordosis. Grade 1 L4-5 degenerative anterolisthesis. Vertebrae: There is mild degenerative endplate edema at the superior L5 and opposing L5-S1 endplates. There is articulation of spinous processes with pseudoarthrosis and edema at multiple levels greatest at L5-S1 compatible with Baastrup's disease (series 5, image 9). There are degenerative facet effusions from the L3 through S1 levels. Suspected laterally directed synovial cyst of the right L4-5 facet measuring 8 mm (series 5 image 4) into paraspinal muscles. No evidence for fracture, diskitis, or suspicious osseous lesion. Conus medullaris: Extends to the L1-2 level and appears normal. Paraspinal and other soft tissues: Negative. Disc levels: L1-2: No significant disc displacement, foraminal narrowing, or canal stenosis. L2-3: Small disc bulge eccentric to the right foraminal zone and right subarticular annular fissure. Mild right foraminal and lateral recess narrowing. No significant canal stenosis. L3-4: Moderate disc bulge eccentric to the right and ligamentum flavum/ facet hypertrophy. Moderate right and mild left foraminal narrowing, bilateral recess narrowing with contact upon descending L4 nerve roots, and moderate canal stenosis. L4-5: Anterolisthesis with uncovered disc, small disc bulge, and left-greater-than-right moderate facet and ligamentum flavum hypertrophy. There is moderate left and mild-to-moderate right foraminal and lateral recess  narrowing. Contact upon left descending L5 nerve root in the lateral recess. Mild canal stenosis. L5-S1: Moderate disc bulge and posterior marginal osteophytes with bilateral facet and ligamentum flavum hypertrophy greater on the left. Moderate right and moderate to severe left foraminal narrowing. No significant canal stenosis. IMPRESSION: 1. Multilevel lumbar spondylosis with prominent discogenic and facet degenerative changes as well as lower lumbar Baastrup's disease. 2. Canal stenosis is greatest at L3-4 where  it is moderate and L4-5 where it is mild. 3. Multiple levels of foraminal narrowing moderate at the right L3-4, left L4-5, right L5-S1 levels and moderate to severe at the left L5-S1 level. Electronically Signed   By: Mitzi HansenLance  Furusawa-Stratton M.D.   On: 01/18/2016 19:31    ____________________________________________   PROCEDURES  Procedure(s) performed: None  Procedures  Critical Care performed: No ____________________________________________   INITIAL IMPRESSION / ASSESSMENT AND PLAN / ED COURSE  Pertinent labs & imaging results that were available during my care of the patient were reviewed by me and considered in my medical decision making (see chart for details).  50 y.o. female with a history of DM, HTN, CVA, and medication noncompliance presenting with urinary and fecal incontinence for 3 months. The most obvious cause of urinary incontinence that is new over the last 3 months, and not associated with a postpartum state, would be overflow from polyuria from hyperglycemia or UTI so we will check both of those. However, this would not explain her fecal incontinence. She does not have any abnormalities on her back exam and nor has she had any other neurologic symptoms concerning for cauda equina, epidural abscess, or spinal cord compression, but I will get an MRI because her symptoms are worrisome. Clinically, there are no findings and her rectal tone is normal. In addition, I will check her for C. difficile and other diarrheal illnesses. Plan reevaluation for final disposition.  The patient's MRI did show some mild to moderate stenosis. I spoke with Dr. Samuella CotaPrice, the neurosurgeon on call for [facility], who reviewed the patient's scans it did not feel there were any evidence of cauda equina syndrome or spinal cord stenosis. He did not feel that her incontinence was related to the spinal cord. The patient is otherwise stable for discharge and was given instructions to follow up with her  primary care physician for her incontinence. She was not able to give stool sample while in the emergency department. She understands return precautions as well as follow-up instructions.  ____________________________________________  FINAL CLINICAL IMPRESSION(S) / ED DIAGNOSES  Final diagnoses:  Incontinence  Urinary incontinence, unspecified type  Incontinence of feces, unspecified fecal incontinence type  Hyperglycemia  Proteinuria, unspecified type    Clinical Course       NEW MEDICATIONS STARTED DURING THIS VISIT:  Discharge Medication List as of 01/18/2016  8:40 PM        Rockne MenghiniAnne-Caroline Suhana Wilner, MD 01/18/16 2247

## 2016-01-18 NOTE — Discharge Instructions (Signed)
Please make sure that you take all your medications as prescribed. Please make a follow-up appointment with your primary care physician.  Return to the emergency department if you develop fever, vomiting, or any other symptoms concerning to you.

## 2016-01-18 NOTE — ED Notes (Signed)
Patient transported to MRI 

## 2016-01-19 NOTE — ED Notes (Signed)
01/19/2016 [id number] [name] [name] is located in the ED Waiting Room, where she has reportedly been since discharge last PM.  Unable to find a place to go.  Taxi voucher acquired but have been unable to find a place for her to go.  ED Social Worker notified at [phone] - reports she will come to talk with [name] [name].   [name] [name] has been given a [place] sandwich box and a soda.  Given a depends, paper pants, and a packet of wipes to clean self in bathroom.

## 2016-01-25 ENCOUNTER — Emergency Department
Admission: EM | Admit: 2016-01-25 | Discharge: 2016-02-02 | Disposition: A | Discharge status: Home / Self Care | Payer: Medicaid Other | Attending: Emergency Medicine | Admitting: Emergency Medicine

## 2016-01-25 ENCOUNTER — Encounter: Payer: Self-pay | Admitting: *Deleted

## 2016-01-25 DIAGNOSIS — E119 Type 2 diabetes mellitus without complications: Secondary | ICD-10-CM | POA: Insufficient documentation

## 2016-01-25 DIAGNOSIS — F17210 Nicotine dependence, cigarettes, uncomplicated: Secondary | ICD-10-CM | POA: Diagnosis not present

## 2016-01-25 DIAGNOSIS — I10 Essential (primary) hypertension: Secondary | ICD-10-CM | POA: Insufficient documentation

## 2016-01-25 DIAGNOSIS — Z79899 Other long term (current) drug therapy: Secondary | ICD-10-CM | POA: Insufficient documentation

## 2016-01-25 DIAGNOSIS — Z91040 Latex allergy status: Secondary | ICD-10-CM | POA: Diagnosis not present

## 2016-01-25 DIAGNOSIS — N3000 Acute cystitis without hematuria: Secondary | ICD-10-CM | POA: Diagnosis not present

## 2016-01-25 DIAGNOSIS — R627 Adult failure to thrive: Secondary | ICD-10-CM

## 2016-01-25 DIAGNOSIS — Z794 Long term (current) use of insulin: Secondary | ICD-10-CM | POA: Insufficient documentation

## 2016-01-25 DIAGNOSIS — R32 Unspecified urinary incontinence: Secondary | ICD-10-CM

## 2016-01-25 DIAGNOSIS — R159 Full incontinence of feces: Secondary | ICD-10-CM

## 2016-01-25 DIAGNOSIS — F341 Dysthymic disorder: Secondary | ICD-10-CM

## 2016-01-25 LAB — COMPREHENSIVE METABOLIC PANEL
ALK PHOS: 85 U/L (ref 38–126)
ALT: 13 U/L — AB (ref 14–54)
ANION GAP: 8 (ref 5–15)
AST: 19 U/L (ref 15–41)
Albumin: 3.8 g/dL (ref 3.5–5.0)
BILIRUBIN TOTAL: 0.5 mg/dL (ref 0.3–1.2)
BUN: 10 mg/dL (ref 6–20)
CALCIUM: 9.4 mg/dL (ref 8.9–10.3)
CO2: 29 mmol/L (ref 22–32)
CREATININE: 0.72 mg/dL (ref 0.44–1.00)
Chloride: 104 mmol/L (ref 101–111)
Glucose, Bld: 100 mg/dL — ABNORMAL HIGH (ref 65–99)
Potassium: 3.4 mmol/L — ABNORMAL LOW (ref 3.5–5.1)
Sodium: 141 mmol/L (ref 135–145)
TOTAL PROTEIN: 8.7 g/dL — AB (ref 6.5–8.1)

## 2016-01-25 LAB — CBC WITH DIFFERENTIAL/PLATELET
BASOS PCT: 0 %
Basophils Absolute: 0 10*3/uL (ref 0–0.1)
EOS ABS: 0.2 10*3/uL (ref 0–0.7)
EOS PCT: 2 %
HCT: 46.5 % (ref 35.0–47.0)
HEMOGLOBIN: 15.4 g/dL (ref 12.0–16.0)
Lymphocytes Relative: 24 %
Lymphs Abs: 2.7 10*3/uL (ref 1.0–3.6)
MCH: 30.5 pg (ref 26.0–34.0)
MCHC: 33.1 g/dL (ref 32.0–36.0)
MCV: 92.4 fL (ref 80.0–100.0)
MONOS PCT: 7 %
Monocytes Absolute: 0.8 10*3/uL (ref 0.2–0.9)
NEUTROS PCT: 67 %
Neutro Abs: 7.5 10*3/uL — ABNORMAL HIGH (ref 1.4–6.5)
PLATELETS: 367 10*3/uL (ref 150–440)
RBC: 5.04 MIL/uL (ref 3.80–5.20)
RDW: 13 % (ref 11.5–14.5)
WBC: 11.3 10*3/uL — AB (ref 3.6–11.0)

## 2016-01-25 LAB — SALICYLATE LEVEL

## 2016-01-25 LAB — ACETAMINOPHEN LEVEL

## 2016-01-25 LAB — ETHANOL: Alcohol, Ethyl (B): <5 mg/dL (ref <5)

## 2016-01-25 MED ORDER — LISINOPRIL 20 MG PO TABS: 20.0000 mg | ORAL_TABLET | ORAL | Status: DC

## 2016-01-25 MED ORDER — GLYBURIDE 5 MG PO TABS
10.0000 mg | ORAL_TABLET | Freq: Two times a day (BID) | ORAL | Status: DC
  Administered 2016-01-26 – 2016-02-02 (×12): 10 mg via ORAL
  Filled 2016-01-25 (×12): qty 2

## 2016-01-25 MED ORDER — ASPIRIN 81 MG PO TABS: 81.0000 mg | ORAL_TABLET | ORAL | Status: DC

## 2016-01-25 MED ORDER — ASPIRIN EC 81 MG PO TBEC
81.0000 mg | DELAYED_RELEASE_TABLET | ORAL | Status: DC
  Administered 2016-01-26 – 2016-02-02 (×8): 81 mg via ORAL
  Filled 2016-01-25 (×8): qty 1

## 2016-01-25 MED ORDER — LISINOPRIL 20 MG PO TABS
20.0000 mg | ORAL_TABLET | ORAL | Status: DC
  Administered 2016-01-26 – 2016-02-02 (×8): 20 mg via ORAL
  Filled 2016-01-25 (×8): qty 1

## 2016-01-25 NOTE — ED Notes (Signed)
Clothing, purse, cane and gold colored ring with clear stone placed in patient belongings bag

## 2016-01-25 NOTE — ED Triage Notes (Signed)
Pt arrives under IVC with BPD, pt has been staying at cousions house but cousin states pt is unable to care for herself, states she urinates and deficates on herself, when asked why pt states "I dont know", pt denies any medical complaints at present, pt taken to decon shower for bath upon arrival

## 2016-01-25 NOTE — BH Assessment (Signed)
Assessment Note  [name] [name] is an 50 y.o. female. Pt brought to [facility] under IVC taken out by family members.  Dr Toni Amendlapacs completed his evaluation prior to TTS being completed and informed TTS that he was releasing pt from IVC.  Pt denies SI/HI/AV.  Pt denies substance use.  Pt denies psych history.  Pt reports that she has been living with "cousins" and they do not want her to live with them anymore, possibly due to pt having daily incontinance issues.    Diagnosis: deferred  Past Medical History:  Past Medical History:  Diagnosis Date  . Anxiety   . Arthritis   . Diabetes mellitus without complication (HCC)   . Hypertension   . Stroke [facility](HCC) 2004   x 2    Past Surgical History:  Procedure Laterality Date  . NO PAST SURGERIES      Family History: History reviewed. No pertinent family history.  Social History:  reports that she has been smoking Cigarettes.  She has been smoking about 1.00 pack per day. She has never used smokeless tobacco. She reports that she does not drink alcohol or use drugs.  Additional Social History:  Alcohol / Drug Use Pain Medications: pt denies any alcohol or drug use.  No UDS obtained.  BAC <5. History of alcohol / drug use?: No history of alcohol / drug abuse  CIWA: CIWA-Ar BP: (!) 166/83 Pulse Rate: 77 COWS:    Allergies:  Allergies  Allergen Reactions  . Latex Rash    Severe itching.    Home Medications:  (Not in a hospital admission)  OB/GYN Status:  No LMP recorded. Patient is not currently having periods (Reason: Perimenopausal).  General Assessment Data [facility] ED TTS Assessment: In system Is this a Tele or Face-to-Face Assessment?: Face-to-Face Is this an Initial Assessment or a Re-assessment for this encounter?: Initial Assessment Is patient pregnant?: No Pregnancy Status: No Living Arrangements: Other (Comment) (Pt reports she is homeless) Can pt return to current living arrangement?: No Admission  Status: Involuntary Is patient capable of signing voluntary admission?: Yes Referral Source: Self/Family/Friend Insurance type: medicaid     Crisis Care Plan Living Arrangements: Other (Comment) (Pt reports she is homeless) Name of Psychiatrist: none reported Name of Therapist: none reported     Risk to self with the past 6 months Suicidal Ideation: No Has patient been a risk to self within the past 6 months prior to admission? : No Suicidal Intent: No Has patient had any suicidal intent within the past 6 months prior to admission? : No Is patient at risk for suicide?: No Suicidal Plan?: No Has patient had any suicidal plan within the past 6 months prior to admission? : No Access to Means: No What has been your use of drugs/alcohol within the last 12 months?: pt denies use Previous Attempts/Gestures: No Intentional Self Injurious Behavior: None Family Suicide History: No Recent stressful life event(s): Other (Comment) (homelessness) Persecutory voices/beliefs?: No Depression: Yes Depression Symptoms:  (pt reported no symptoms) Substance abuse history and/or treatment for substance abuse?: No  Risk to Others within the past 6 months Homicidal Ideation: No Does patient have any lifetime risk of violence toward others beyond the six months prior to admission? : No Thoughts of Harm to Others: No Current Homicidal Intent: No Current Homicidal Plan: No Access to Homicidal Means: No History of harm to others?: No Assessment of Violence: None Noted Does patient have access to weapons?: No Criminal Charges Pending?: Yes Describe  Pending Criminal Charges: traffic related Does patient have a court date:  (pt does not know date) Is patient on probation?: No  Psychosis Hallucinations: None noted Delusions: None noted  Mental Status Report Appearance/Hygiene: Disheveled Eye Contact: Fair Motor Activity: Unremarkable Speech: Logical/coherent Level of Consciousness:  Alert Mood: Pleasant Affect: Appropriate to circumstance Anxiety Level: Minimal Thought Processes: Coherent, Relevant Judgement: Unimpaired Orientation: Person, Place, Time, Situation Obsessive Compulsive Thoughts/Behaviors: None  Cognitive Functioning Concentration: Unable to Assess Memory: Recent Intact, Remote Intact IQ: Average Insight: Fair Impulse Control: Good Appetite: Good Weight Loss: 0 Weight Gain: 0 Sleep: No Change Total Hours of Sleep: 10 Vegetative Symptoms: None  ADLScreening [facility] Assessment Services) Patient's cognitive ability adequate to safely complete daily activities?: Yes Patient able to express need for assistance with ADLs?: Yes Independently performs ADLs?: Yes (appropriate for developmental age)  Prior Inpatient Therapy Prior Inpatient Therapy: No  Prior Outpatient Therapy Prior Outpatient Therapy: No Does patient have an ACCT team?: No Does patient have Intensive In-House Services?  : No Does patient have [facility] services? : No Does patient have P4CC services?: No  ADL Screening (condition at time of admission) Patient's cognitive ability adequate to safely complete daily activities?: Yes Patient able to express need for assistance with ADLs?: Yes Independently performs ADLs?: Yes (appropriate for developmental age)       Abuse/Neglect Assessment (Assessment to be complete while patient is alone) Physical Abuse: Denies Verbal Abuse: Denies Sexual Abuse: Denies Exploitation of patient/patient's resources: Denies Self-Neglect: Denies          Additional Information 1:1 In Past 12 Months?: No CIRT Risk: No Elopement Risk: No Does patient have medical clearance?: Yes     Disposition: [facility] Disposition Initial Assessment Completed for this Encounter: Yes Disposition of Patient: Other dispositions Other disposition(s): Other (Comment) (No psych recommendations by Dr Toni Amendlapacs)  On Site  Evaluation by:   Reviewed with Physician:    Lorri FrederickWierda, Tahjir Silveria Jon 01/25/2016 6:10 PM

## 2016-01-25 NOTE — ED Notes (Signed)
Pt woke to get vitals.  Very sleepy.  Pt is dry at this time

## 2016-01-25 NOTE — ED Notes (Signed)
CSW is familiar with pt from a prior encounter. CSW previously provided pt with resources to local shelters and free meals in the area from the ED lobby. Pt presents to the ED again today with concerns of incontinence of bladder and stool. CSW called DSS at [phone]. CSW made a report with APS intake social worker regarding lack of self care. CSW also made an additional report with CPS intake social worker to report pt's lack of care to her minor child.  [name] [name], MSW, Theresia MajorsLCSWA [phone]

## 2016-01-25 NOTE — Consult Note (Signed)
[facility]ry Consult   Reason for Consult:  Consult for 50 year old woman who is brought to the hospital under involuntary petition filed by her family Referring Physician:  Jimmye Norman Patient Identification: [name] [name] MRN:  [id number] Principal Diagnosis: Dysthymia Diagnosis:   Patient Active Problem List   Diagnosis Date Noted  . Urinary incontinence [R32] 01/25/2016  . Fecal incontinence [R15.9] 01/25/2016  . Dysthymia [F34.1] 01/25/2016    Total Time spent with patient: 1 hour  Subjective:   [name] [name] is a 50 y.o. female patient admitted with "I'm peeing and I don't know why".  HPI:  Patient interviewed. Chart reviewed. I have reviewed multiple notes from her chart at our hospital as well as notes from her primary care doctor going back a few years. Labs reviewed. Commitment paperwork reviewed. This patient was brought to the hospital under papers the claims that she had schizophrenia and noted that she has been having fecal and urinary incontinence. On interview today the patient states that her mood is chronically anxious and depressed in a vague way. It's been a little bit worse the last several months. She still sleeps adequately and eats adequately. Patient denies feeling hopeless. Denies any suicidal thoughts at all. Denies any suicidal or dangerous acts. Denies homicidal ideation. She denies having auditory or visual hallucinations and does not show signs of paranoia or delusional thinking. She is not currently receiving any outpatient mental health treatment. Which she is complaining of his several months of urinary and fecal incontinence that of been happening on a pretty much daily basis. There is evidence in the chart that she has gone to see doctors both at our emergency room and through her primary care doctor with this complaint more than once over the last few months. At no time is there any indication that this was thought of his being a psychiatric  symptom. Patient denies that she is doing any of this behavior intentionally. She states that she spends most of her time sitting around doing nothing. She is living with some cousins of hers but says that she has been aware that they were not satisfied with her being there because of her incontinence. Not currently working.  Social history: Not married. Has 3 children but all are adolescent 2 young adult and do not live with her. Patient had been living with a partner until a couple months ago but now has been living with some "cousins". Doesn't get disability. Struggles financially. 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  Medical history: Multiple medical problems including diabetes. Evidence that is not under very good control. Last couple time she's been in our emergency room sugars of been very high and the sugar in her urine has been very high. Patient has had a little bit of work up for this incontinence problem but no specific cause has been determined.  Substance abuse history: Denies alcohol or drug abuse. Denies any past substance abuse. No evidence in the old chart of it being a concern previously  Past Psychiatric History: Patient denies that she has ever been diagnosed with schizophrenia. I don't see anything in any of the old chart to suggest the presence of psychotic symptoms or a schizophrenia diagnosis. There is mention of anxiety and depression but not as a primary focus of treatment. She denies ever being admitted to a psychiatric hospital and denies any history of suicide attempts or self injury. Denies any history of  violence. Says she is not currently seeing anyone for therapy or treatment. I saw one old notes that mentioned bipolar disorder as a diagnosis but there was not any detail to support it that I could identify.  Risk to Self:  none identified Risk to Others:  none identified Prior Inpatient Therapy:  denies any prior  inpatient Prior Outpatient Therapy:  distant past history of therapeutic and treatment of anxiety  Past Medical History:  Past Medical History:  Diagnosis Date  . Anxiety   . Arthritis   . Diabetes mellitus without complication ([place])   . Hypertension   . Stroke [facility]) 2004   x 2    Past Surgical History:  Procedure Laterality Date  . NO PAST SURGERIES     Family History: History reviewed. No pertinent family history. Family Psychiatric  History: Patient denies knowledge of any family history of mental illness Social History:  History  Alcohol Use No     History  Drug Use No    Social History   Social History  . Marital status: Single    Spouse name: N/A  . Number of children: N/A  . Years of education: N/A   Social History Main Topics  . Smoking status: Current Every Day Smoker    Packs/day: 1.00    Types: Cigarettes  . Smokeless tobacco: Never Used  . Alcohol use No  . Drug use: No  . Sexual activity: Not Asked   Other Topics Concern  . None   Social History Narrative  . None   Additional Social History:    Allergies:   Allergies  Allergen Reactions  . Latex Rash    Severe itching.    Labs:  Results for orders placed or performed during the hospital encounter of 01/25/16 (from the past 48 hour(s))  Comprehensive metabolic panel     Status: Abnormal   Collection Time: 01/25/16  3:29 PM  Result Value Ref Range   Sodium 141 135 - 145 mmol/L   Potassium 3.4 (L) 3.5 - 5.1 mmol/L   Chloride 104 101 - 111 mmol/L   CO2 29 22 - 32 mmol/L   Glucose, Bld 100 (H) 65 - 99 mg/dL   BUN 10 6 - 20 mg/dL   Creatinine, Ser 0.72 0.44 - 1.00 mg/dL   Calcium 9.4 8.9 - 10.3 mg/dL   Total Protein 8.7 (H) 6.5 - 8.1 g/dL   Albumin 3.8 3.5 - 5.0 g/dL   AST 19 15 - 41 U/L   ALT 13 (L) 14 - 54 U/L   Alkaline Phosphatase 85 38 - 126 U/L   Total Bilirubin 0.5 0.3 - 1.2 mg/dL   GFR calc non Af Amer >60 >60 mL/min   GFR calc Af Amer >60 >60 mL/min    Comment:  (NOTE) The eGFR has been calculated using the CKD EPI equation. This calculation has not been validated in all clinical situations. eGFR's persistently <60 mL/min signify possible Chronic Kidney Disease.    Anion gap 8 5 - 15  Ethanol     Status: None   Collection Time: 01/25/16  3:29 PM  Result Value Ref Range   Alcohol, Ethyl (B) <5 <5 mg/dL    Comment:        LOWEST DETECTABLE LIMIT FOR SERUM ALCOHOL IS 5 mg/dL FOR MEDICAL PURPOSES ONLY   CBC WITH DIFFERENTIAL     Status: Abnormal   Collection Time: 01/25/16  3:29 PM  Result Value Ref Range   WBC 11.3 (H) 3.6 -  11.0 K/uL   RBC 5.04 3.80 - 5.20 MIL/uL   Hemoglobin 15.4 12.0 - 16.0 g/dL   HCT 46.5 35.0 - 47.0 %   MCV 92.4 80.0 - 100.0 fL   MCH 30.5 26.0 - 34.0 pg   MCHC 33.1 32.0 - 36.0 g/dL   RDW 13.0 11.5 - 14.5 %   Platelets 367 150 - 440 K/uL   Neutrophils Relative % 67 %   Neutro Abs 7.5 (H) 1.4 - 6.5 K/uL   Lymphocytes Relative 24 %   Lymphs Abs 2.7 1.0 - 3.6 K/uL   Monocytes Relative 7 %   Monocytes Absolute 0.8 0.2 - 0.9 K/uL   Eosinophils Relative 2 %   Eosinophils Absolute 0.2 0 - 0.7 K/uL   Basophils Relative 0 %   Basophils Absolute 0.0 0 - 0.1 K/uL  Salicylate level     Status: None   Collection Time: 01/25/16  3:29 PM  Result Value Ref Range   Salicylate Lvl <6.4 2.8 - 30.0 mg/dL  Acetaminophen level     Status: Abnormal   Collection Time: 01/25/16  3:29 PM  Result Value Ref Range   Acetaminophen (Tylenol), Serum <10 (L) 10 - 30 ug/mL    Comment:        THERAPEUTIC CONCENTRATIONS VARY SIGNIFICANTLY. A RANGE OF 10-30 ug/mL MAY BE AN EFFECTIVE CONCENTRATION FOR MANY PATIENTS. HOWEVER, SOME ARE BEST TREATED AT CONCENTRATIONS OUTSIDE THIS RANGE. ACETAMINOPHEN CONCENTRATIONS >150 ug/mL AT 4 HOURS AFTER INGESTION AND >50 ug/mL AT 12 HOURS AFTER INGESTION ARE OFTEN ASSOCIATED WITH TOXIC REACTIONS.     No current facility-administered medications for this encounter.    Current Outpatient  Prescriptions  Medication Sig Dispense Refill  . aspirin 81 MG tablet Take 81 mg by mouth every morning.    . glyBURIDE (DIABETA) 5 MG tablet Take 2 tablets (10 mg total) by mouth 2 (two) times daily with a meal. 60 tablet 0  . insulin glargine (LANTUS) 100 UNIT/ML injection Inject 0.65 mLs (65 Units total) into the skin at bedtime. 20 mL 0  . lisinopril (PRINIVIL,ZESTRIL) 20 MG tablet Take 20 mg by mouth every morning.    . metFORMIN (GLUCOPHAGE) 500 MG tablet Take 2 tablets (1,000 mg total) by mouth 2 (two) times daily with a meal. 2, 000 mg at supper. 120 tablet 0  . naproxen (NAPROSYN) 500 MG tablet Take 1 tablet (500 mg total) by mouth 2 (two) times daily with a meal. 14 tablet 0    Musculoskeletal: Strength & Muscle Tone: decreased Gait & Station: normal Patient leans: N/A  Psychiatric Specialty Exam: Physical Exam  Nursing note and vitals reviewed. Constitutional: She appears well-developed and well-nourished.  HENT:  Head: Normocephalic and atraumatic.  Eyes: Conjunctivae are normal. Pupils are equal, round, and reactive to light.  Neck: Normal range of motion.  Cardiovascular: Normal heart sounds.   Respiratory: Effort normal. No respiratory distress.  GI: Soft.  Musculoskeletal: Normal range of motion.  Neurological: She is alert.  Skin: Skin is warm and dry.  Psychiatric: Judgment normal. Her affect is blunt. Her speech is delayed. She is slowed. Thought content is not paranoid. Cognition and memory are normal. She expresses no homicidal and no suicidal ideation.    Review of Systems  Constitutional: Negative.   HENT: Negative.   Eyes: Negative.   Respiratory: Negative.   Cardiovascular: Negative.   Gastrointestinal: Negative.   Genitourinary: Positive for frequency.       Patient complains of chronic urinary and fecal incontinence  which has been present for several months  Musculoskeletal: Negative.   Skin: Negative.   Neurological: Negative.    Psychiatric/Behavioral: Positive for depression. Negative for hallucinations, memory loss, substance abuse and suicidal ideas. The patient is nervous/anxious. The patient does not have insomnia.     Blood pressure (!) 166/83, pulse 77, temperature 98.4 F (36.9 C), temperature source Oral, resp. rate 15, height _0  (1.727 m), weight 117.9 kg (260 lb), SpO2 100 %.Body mass index is 39.53 kg/m.  General Appearance: Casual  Eye Contact:  Fair  Speech:  Slow  Volume:  Decreased  Mood:  Depressed  Affect:  Congruent  Thought Process:  Goal Directed  Orientation:  Full (Time, Place, and Person)  Thought Content:  Logical  Suicidal Thoughts:  No  Homicidal Thoughts:  No  Memory:  Immediate;   Good Recent;   Fair Remote;   Fair  Judgement:  Fair  Insight:  Fair  Psychomotor Activity:  Normal  Concentration:  Concentration: Fair  Recall:  [employer] of Knowledge:  Fair  Language:  Fair  Akathisia:  No  Handed:  Right  AIMS (if indicated):     Assets:  Communication Skills Desire for Improvement Resilience  ADL's:  Intact  Cognition:  WNL  Sleep:        Treatment Plan Summary: Plan This is a 50 year old woman who was petitioned to the hospital with an allegation of a history of schizophrenia. I can't find any evidence either in the record or the clinical exam of schizophrenia or anything like it. Patient has a mild degree of anxiety and depression but no psychosis. No evidence of dangerousness. Primary problem is medical and social especially if the people she was staying with will not let her come back home. I have discontinue the involuntary commitment. Case reviewed with TTS. I will speak with the emergency room doctor. Psychiatrically she does not need any kind of specific care at this point. She can be discharged from the emergency room from a psychiatry standpoint and does not need specific follow-up as she ordered he has a primary care doctor she gets her treatment  from.  Disposition: Patient does not meet criteria for psychiatric inpatient admission. Supportive therapy provided about ongoing stressors.  Alethia Berthold, MD 01/25/2016 4:57 PM

## 2016-01-25 NOTE — ED Notes (Addendum)
Pt refused snack 

## 2016-01-25 NOTE — ED Provider Notes (Signed)
[facility] Emergency Department Provider Note  ____________________________________________   First MD Initiated Contact with Patient 01/25/16 1433     (approximate)  I have reviewed the triage vital signs and the nursing notes.   HISTORY  Chief Complaint failure to care for self    HPI [name] [name] is a 50 y.o. female who may or may not have a history of schizophrenia presents with [place] [employer] under IVC due to inability to care for herself.  Her cousin called the police because the patient has not been working for at least a week and has reportedly been urinating and defecating on herself for at least a month.  She was seen in this ED about a week ago and had an MRI which showed some stenosis, but neurosurgery was consulted and did not feel that her imaging explained her symptoms.    She currently seems to be unable to care for herself, but denies any acute medical complaints or concerns at this time as documented in the ROS below.  Symptoms sound severe based on the description and the fact that she had to be taken to the decon room and thoroughly cleaned prior to coming to the exam room, but they also are apparently gradual in onset.   Past Medical History:  Diagnosis Date  . Anxiety   . Arthritis   . Diabetes mellitus without complication ([facility])   . Hypertension   . Stroke [facility]) 2004   x 2    There are no active problems to display for this patient.   Past Surgical History:  Procedure Laterality Date  . NO PAST SURGERIES      Prior to Admission medications   Medication Sig Start Date End Date Taking? Authorizing Provider  aspirin 81 MG tablet Take 81 mg by mouth every morning.    Historical Provider, MD  glyBURIDE (DIABETA) 5 MG tablet Take 2 tablets (10 mg total) by mouth 2 (two) times daily with a meal. 12/18/15   Sharman CheekPhillip Stafford, MD  insulin glargine (LANTUS) 100 UNIT/ML injection Inject 0.65 mLs (65 Units total) into the skin at  bedtime. 12/18/15   Sharman CheekPhillip Stafford, MD  lisinopril (PRINIVIL,ZESTRIL) 20 MG tablet Take 20 mg by mouth every morning.    Historical Provider, MD  metFORMIN (GLUCOPHAGE) 500 MG tablet Take 2 tablets (1,000 mg total) by mouth 2 (two) times daily with a meal. 2, 000 mg at supper. 12/18/15   Sharman CheekPhillip Stafford, MD  naproxen (NAPROSYN) 500 MG tablet Take 1 tablet (500 mg total) by mouth 2 (two) times daily with a meal. 08/04/15   Jami L Hagler, PA-C    Allergies Latex  History reviewed. No pertinent family history.  Social History Social History  Substance Use Topics  . Smoking status: Current Every Day Smoker    Packs/day: 1.00    Types: Cigarettes  . Smokeless tobacco: Never Used  . Alcohol use No    Review of Systems Constitutional: No fever/chills Eyes: No visual changes. ENT: No sore throat. Cardiovascular: Denies chest pain. Respiratory: Denies shortness of breath. Gastrointestinal: No abdominal pain.  No nausea, no vomiting.  No diarrhea.  No constipation. Genitourinary: Negative for dysuria.  Reports incontinence of urine and bowel. Musculoskeletal: Negative for back pain. Skin: Negative for rash. Neurological: Negative for headaches, focal weakness or numbness.  10-point ROS otherwise negative.  ____________________________________________   PHYSICAL EXAM:  VITAL SIGNS: ED Triage Vitals  Enc Vitals Group     BP --  Pulse Rate 01/25/16 1351 91     Resp 01/25/16 1351 18     Temp 01/25/16 1351 98.4 F (36.9 C)     Temp Source 01/25/16 1351 Oral     SpO2 01/25/16 1351 99 %     Weight 01/25/16 1337 260 lb (117.9 kg)     Height 01/25/16 1337 5\' 8"  (1.727 m)     Head Circumference --      Peak Flow --      Pain Score 01/25/16 1337 0     Pain Loc --      Pain Edu? --      Excl. in GC? --     Constitutional: Alert and oriented. Well appearing and in no acute distress. Eyes: Conjunctivae are normal. PERRL. EOMI. Head: Atraumatic. Nose: No  congestion/rhinnorhea. Mouth/Throat: Mucous membranes are moist.  Oropharynx non-erythematous. Neck: No stridor.  No meningeal signs.   Cardiovascular: Normal rate, regular rhythm. Good peripheral circulation. Grossly normal heart sounds. Respiratory: Normal respiratory effort.  No retractions. Lungs CTAB. Gastrointestinal: Soft and nontender. No distention.  Musculoskeletal: No lower extremity tenderness nor edema. No gross deformities of extremities. Neurologic:  Normal speech and language. No gross focal neurologic deficits are appreciated.  Skin:  Skin is warm, dry and intact. No rash noted. Psychiatric: Mood and affect are normal. Speech and behavior are normal.  Calm and cooperative.  Seems to have a lack of insight and judgment into her condition.  Uncertain capacity. ____________________________________________   LABS (all labs ordered are listed, but only abnormal results are displayed)  Labs Reviewed  COMPREHENSIVE METABOLIC PANEL  ETHANOL  CBC WITH DIFFERENTIAL/PLATELET  URINE DRUG SCREEN, QUALITATIVE ([facility] ONLY)  URINALYSIS, ROUTINE W REFLEX MICROSCOPIC  SALICYLATE LEVEL  ACETAMINOPHEN LEVEL   ____________________________________________  EKG  None - EKG not ordered by ED physician ____________________________________________  RADIOLOGY   No results found.  ____________________________________________   PROCEDURES  Procedure(s) performed:   Procedures   Critical Care performed: No ____________________________________________   INITIAL IMPRESSION / ASSESSMENT AND PLAN / ED COURSE  Pertinent labs & imaging results that were available during my care of the patient were reviewed by me and considered in my medical decision making (see chart for details).  The patient does not appear to have an acute or emergent medical condition at this time, but I am uncertain as to what role mental illness may play into her condition.  She was recently evaluated with an  MRI and neurosurgery felt that it did not explain her symptoms.  I have ordered social work, psychiatry, and TTS consults as well as the usual psychiatric/"med clearance" lab work.  ____________________________________________  FINAL CLINICAL IMPRESSION(S) / ED DIAGNOSES  Final diagnoses:  None     MEDICATIONS GIVEN DURING THIS VISIT:  Medications - No data to display   NEW OUTPATIENT MEDICATIONS STARTED DURING THIS VISIT:  New Prescriptions   No medications on file    Modified Medications   No medications on file    Discontinued Medications   No medications on file     Note:  This document was prepared using Dragon voice recognition software and may include unintentional dictation errors.    Loleta Roseory Elian Gloster, MD 01/25/16 1450

## 2016-01-25 NOTE — ED Notes (Signed)
Pt. Noted in room sleeping;. No complaints or concerns voiced. No distress or abnormal behavior noted. Will continue to monitor with security cameras. Q 15 minute rounds continue. 

## 2016-01-25 NOTE — Care Management Note (Signed)
Case Management Note  Patient Details  Name: [name] [name] MRN: [id number] Date of Birth: 02/11/1966  Subjective/Objective:        Attempted to see if there are resources for this lady since she does have Medicaid, and ended up calling [facility] at [phone] [facility]. Her vm is full but I will continue to try to reach her.            Action/Plan:   Expected Discharge Date:                  Expected Discharge Plan:     In-House Referral:     Discharge planning Services     Post Acute Care Choice:    Choice offered to:     DME Arranged:    DME Agency:     HH Arranged:    HH Agency:     Status of Service:     If discussed at [employer] Length of Stay Meetings, dates discussed:    Additional Comments:  Berna BueCheryl Daelyn Pettaway, RN 01/25/2016, 2:20 PM

## 2016-01-26 LAB — URINALYSIS, ROUTINE W REFLEX MICROSCOPIC
Bilirubin Urine: NEGATIVE
GLUCOSE, UA: 150 mg/dL — AB
KETONES UR: NEGATIVE mg/dL
NITRITE: POSITIVE — AB
PH: 5 (ref 5.0–8.0)
Protein, ur: 30 mg/dL — AB
Specific Gravity, Urine: 1.023 (ref 1.005–1.030)

## 2016-01-26 LAB — URINE DRUG SCREEN, QUALITATIVE (ARMC ONLY)
AMPHETAMINES, UR SCREEN: NOT DETECTED
Barbiturates, Ur Screen: NOT DETECTED
Benzodiazepine, Ur Scrn: NOT DETECTED
CANNABINOID 50 NG, UR ~~LOC~~: POSITIVE — AB
Cocaine Metabolite,Ur ~~LOC~~: NOT DETECTED
MDMA (ECSTASY) UR SCREEN: NOT DETECTED
METHADONE SCREEN, URINE: NOT DETECTED
Opiate, Ur Screen: NOT DETECTED
Phencyclidine (PCP) Ur S: NOT DETECTED
TRICYCLIC, UR SCREEN: NOT DETECTED

## 2016-01-26 LAB — GLUCOSE, CAPILLARY: GLUCOSE-CAPILLARY: 405 mg/dL — AB (ref 65–99)

## 2016-01-26 NOTE — ED Provider Notes (Signed)
-----------------------------------------   8:18 AM on 01/26/2016 -----------------------------------------   Blood pressure (!) 160/100, pulse 89, temperature 97.8 F (36.6 C), temperature source Oral, resp. rate 18, height 5\' 8"  (1.727 m), weight 260 lb (117.9 kg), SpO2 100 %.  The patient had no acute events since last update.  Calm and cooperative at this time.  Disposition is pending Psychiatry/Behavioral Medicine team recommendations.     [name] PlantJames A McShane, MD 01/26/16 [phone]

## 2016-01-26 NOTE — ED Notes (Signed)
Pt eating dinner

## 2016-01-26 NOTE — NC FL2 (Signed)
  [place] MEDICAID FL2 LEVEL OF CARE SCREENING TOOL     IDENTIFICATION  Patient Name: [name] [name] Birthdate: 08-01-1965 Sex: female Admission Date (Current Location): 01/25/2016  [place]  [place] [id number] [facility] Facility and Address:  [facility], [address], [place], [place] [address]      Provider Number: [id number]  Attending Physician Name and Address:  No att. providers found  Relative Name and Phone Number:       Current Level of Care: Hospital Recommended Level of Care: Assisted Living Facility Prior Approval Number:    Date Approved/Denied:   PASRR Number:    Discharge Plan: Other (Comment) (ALF)    Current Diagnoses: Patient Active Problem List   Diagnosis Date Noted  . Urinary incontinence 01/25/2016  . Fecal incontinence 01/25/2016  . Dysthymia 01/25/2016    Orientation RESPIRATION BLADDER Height & Weight     Self, Time, Situation, Place  Normal Incontinent Weight: 260 lb (117.9 kg) Height:  5\' 8"  (172.7 cm)  BEHAVIORAL SYMPTOMS/MOOD NEUROLOGICAL BOWEL NUTRITION STATUS     (None) Incontinent Diet (Regular)  AMBULATORY STATUS COMMUNICATION OF NEEDS Skin   Independent Non-Verbally Normal                       Personal Care Assistance Level of Assistance  Bathing, Feeding, Dressing Bathing Assistance: Limited assistance Feeding assistance: Independent Dressing Assistance: Independent     Functional Limitations Info  Sight, Hearing, Speech Sight Info: Adequate Hearing Info: Adequate Speech Info: Adequate    SPECIAL CARE FACTORS FREQUENCY                       Contractures Contractures Info: Not present    Additional Factors Info  Code Status, Allergies Code Status Info: Not on file Allergies Info: Latex           Current Medications (01/26/2016):  This is the current hospital active medication list Current Facility-Administered Medications  Medication Dose Route  Frequency Provider Last Rate Last Dose  . aspirin EC tablet 81 mg  81 mg Oral BH-q7a Emily FilbertJonathan E Williams, MD   81 mg at 01/26/16 [id number]  . glyBURIDE (DIABETA) tablet 10 mg  10 mg Oral BID WC Emily FilbertJonathan E Williams, MD      . lisinopril (PRINIVIL,ZESTRIL) tablet 20 mg  20 mg Oral BH-q7a Emily FilbertJonathan E Williams, MD   20 mg at 01/26/16 [id number]   Current Outpatient Prescriptions  Medication Sig Dispense Refill  . aspirin 81 MG tablet Take 81 mg by mouth every morning.    . glyBURIDE (DIABETA) 5 MG tablet Take 2 tablets (10 mg total) by mouth 2 (two) times daily with a meal. 60 tablet 0  . insulin glargine (LANTUS) 100 UNIT/ML injection Inject 0.65 mLs (65 Units total) into the skin at bedtime. (Patient taking differently: Inject 50 Units into the skin at bedtime. ) 20 mL 0  . lisinopril (PRINIVIL,ZESTRIL) 20 MG tablet Take 20 mg by mouth every morning.    . naproxen (NAPROSYN) 500 MG tablet Take 1 tablet (500 mg total) by mouth 2 (two) times daily with a meal. (Patient not taking: Reported on 01/25/2016) 14 tablet 0     Discharge Medications: Please see discharge summary for a list of discharge medications.  Relevant Imaging Results:  Relevant Lab Results:   Additional Information SSN: [id number]  [name] [name], LCSWA

## 2016-01-26 NOTE — ED Notes (Signed)
Pts BP elevated last couple of times. All Meds reconciled to start to day except lantus per Dr. Mayford KnifeWilliams Last night.

## 2016-01-26 NOTE — ED Notes (Signed)
Pt. Noted in room awake. Asked if dry pt states yes. Attempted to get urine sample. Pt couldn't go. Checked brief wet. Changed brief. Pt will need tolieting scheduled during the day. No complaints or concerns voiced. No distress or abnormal behavior noted. Will continue to monitor with security cameras. Q 15 minute rounds continue.

## 2016-01-26 NOTE — Consult Note (Signed)
[facility] Consult   Reason for Consult:  Consult for 50 year old woman who is brought to the hospital under involuntary petition filed by her family Referring Physician:  Jimmye Norman Patient Identification: [name] [name] MRN:  [id number] Principal Diagnosis: Dysthymia Diagnosis:   Patient Active Problem List   Diagnosis Date Noted  . Urinary incontinence [R32] 01/25/2016  . Fecal incontinence [R15.9] 01/25/2016  . Dysthymia [F34.1] 01/25/2016    Total Time spent with patient: 20 minutes  Subjective:   [name] [name] is a 50 y.o. female patient admitted with "I'm peeing and I don't know why".  Follow-up note for Wednesday the sixth. Patient interviewed. Spoke with Dr. Burlene Arnt in the emergency room as well. Patient continues to deny any symptoms of depression or psychosis. At the same time I think her behavior and interaction does remain rather odd. She is not very forthcoming with information and has a somewhat immature tone about how she behaves and is also very passive. She says that she just wants social work to do whatever they can do to get her a place to stay and doesn't express any preference about it.  Patient tells me she has continued to have some urinary incontinence today but has not had any fecal incontinence.  HPI:  Patient interviewed. Chart reviewed. I have reviewed multiple notes from her chart at our hospital as well as notes from her primary care doctor going back a few years. Labs reviewed. Commitment paperwork reviewed. This patient was brought to the hospital under papers the claims that she had schizophrenia and noted that she has been having fecal and urinary incontinence. On interview today the patient states that her mood is chronically anxious and depressed in a vague way. It's been a little bit worse the last several months. She still sleeps adequately and eats adequately. Patient denies feeling hopeless. Denies any suicidal thoughts at all.  Denies any suicidal or dangerous acts. Denies homicidal ideation. She denies having auditory or visual hallucinations and does not show signs of paranoia or delusional thinking. She is not currently receiving any outpatient mental health treatment. Which she is complaining of his several months of urinary and fecal incontinence that of been happening on a pretty much daily basis. There is evidence in the chart that she has gone to see doctors both at our emergency room and through her primary care doctor with this complaint more than once over the last few months. At no time is there any indication that this was thought of his being a psychiatric symptom. Patient denies that she is doing any of this behavior intentionally. She states that she spends most of her time sitting around doing nothing. She is living with some cousins of hers but says that she has been aware that they were not satisfied with her being there because of her incontinence. Not currently working.  Social history: Not married. Has 3 children but all are adolescent 2 young adult and do not live with her. Patient had been living with a partner until a couple months ago but now has been living with some "cousins". Doesn't get disability. Struggles financially. 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  Medical history: Multiple medical problems including diabetes. Evidence that is not under very good control. Last couple time she's been in our emergency room sugars of been very high and the sugar in her urine has been very high. Patient has had a little bit  of work up for this incontinence problem but no specific cause has been determined.  Substance abuse history: Denies alcohol or drug abuse. Denies any past substance abuse. No evidence in the old chart of it being a concern previously  Past Psychiatric History: Patient denies that she has ever been diagnosed with schizophrenia. I don't see  anything in any of the old chart to suggest the presence of psychotic symptoms or a schizophrenia diagnosis. There is mention of anxiety and depression but not as a primary focus of treatment. She denies ever being admitted to a psychiatric hospital and denies any history of suicide attempts or self injury. Denies any history of violence. Says she is not currently seeing anyone for therapy or treatment. I saw one old notes that mentioned bipolar disorder as a diagnosis but there was not any detail to support it that I could identify.  Risk to Self: Suicidal Ideation: No Suicidal Intent: No Is patient at risk for suicide?: No Suicidal Plan?: No Access to Means: No What has been your use of drugs/alcohol within the last 12 months?: pt denies use Intentional Self Injurious Behavior: Nonenone identified Risk to Others: Homicidal Ideation: No Thoughts of Harm to Others: No Current Homicidal Intent: No Current Homicidal Plan: No Access to Homicidal Means: No History of harm to others?: No Assessment of Violence: None Noted Does patient have access to weapons?: No Criminal Charges Pending?: Yes Describe Pending Criminal Charges: traffic related Does patient have a court date:  (pt does not know date)none identified Prior Inpatient Therapy: Prior Inpatient Therapy: Nodenies any prior inpatient Prior Outpatient Therapy: Prior Outpatient Therapy: No Does patient have an ACCT team?: No Does patient have Intensive In-House Services?  : No Does patient have [facility] services? : No Does patient have P4CC services?: Nodistant past history of therapeutic and treatment of anxiety  Past Medical History:  Past Medical History:  Diagnosis Date  . Anxiety   . Arthritis   . Diabetes mellitus without complication ([place])   . Hypertension   . Stroke [facility]) 2004   x 2    Past Surgical History:  Procedure Laterality Date  . NO PAST SURGERIES     Family History: History reviewed. No pertinent family  history. Family Psychiatric  History: Patient denies knowledge of any family history of mental illness Social History:  History  Alcohol Use No     History  Drug Use No    Social History   Social History  . Marital status: Single    Spouse name: N/A  . Number of children: N/A  . Years of education: N/A   Social History Main Topics  . Smoking status: Current Every Day Smoker    Packs/day: 1.00    Types: Cigarettes  . Smokeless tobacco: Never Used  . Alcohol use No  . Drug use: No  . Sexual activity: Not Asked   Other Topics Concern  . None   Social History Narrative  . None   Additional Social History:    Allergies:   Allergies  Allergen Reactions  . Latex Rash    Severe itching.    Labs:  Results for orders placed or performed during the hospital encounter of 01/25/16 (from the past 48 hour(s))  Comprehensive metabolic panel     Status: Abnormal   Collection Time: 01/25/16  3:29 PM  Result Value Ref Range   Sodium 141 135 - 145 mmol/L   Potassium 3.4 (L) 3.5 - 5.1 mmol/L   Chloride 104 101 -  111 mmol/L   CO2 29 22 - 32 mmol/L   Glucose, Bld 100 (H) 65 - 99 mg/dL   BUN 10 6 - 20 mg/dL   Creatinine, Ser 0.72 0.44 - 1.00 mg/dL   Calcium 9.4 8.9 - 10.3 mg/dL   Total Protein 8.7 (H) 6.5 - 8.1 g/dL   Albumin 3.8 3.5 - 5.0 g/dL   AST 19 15 - 41 U/L   ALT 13 (L) 14 - 54 U/L   Alkaline Phosphatase 85 38 - 126 U/L   Total Bilirubin 0.5 0.3 - 1.2 mg/dL   GFR calc non Af Amer >60 >60 mL/min   GFR calc Af Amer >60 >60 mL/min    Comment: (NOTE) The eGFR has been calculated using the CKD EPI equation. This calculation has not been validated in all clinical situations. eGFR's persistently <60 mL/min signify possible Chronic Kidney Disease.    Anion gap 8 5 - 15  Ethanol     Status: None   Collection Time: 01/25/16  3:29 PM  Result Value Ref Range   Alcohol, Ethyl (B) <5 <5 mg/dL    Comment:        LOWEST DETECTABLE LIMIT FOR SERUM ALCOHOL IS 5 mg/dL FOR  MEDICAL PURPOSES ONLY   CBC WITH DIFFERENTIAL     Status: Abnormal   Collection Time: 01/25/16  3:29 PM  Result Value Ref Range   WBC 11.3 (H) 3.6 - 11.0 K/uL   RBC 5.04 3.80 - 5.20 MIL/uL   Hemoglobin 15.4 12.0 - 16.0 g/dL   HCT 46.5 35.0 - 47.0 %   MCV 92.4 80.0 - 100.0 fL   MCH 30.5 26.0 - 34.0 pg   MCHC 33.1 32.0 - 36.0 g/dL   RDW 13.0 11.5 - 14.5 %   Platelets 367 150 - 440 K/uL   Neutrophils Relative % 67 %   Neutro Abs 7.5 (H) 1.4 - 6.5 K/uL   Lymphocytes Relative 24 %   Lymphs Abs 2.7 1.0 - 3.6 K/uL   Monocytes Relative 7 %   Monocytes Absolute 0.8 0.2 - 0.9 K/uL   Eosinophils Relative 2 %   Eosinophils Absolute 0.2 0 - 0.7 K/uL   Basophils Relative 0 %   Basophils Absolute 0.0 0 - 0.1 K/uL  Salicylate level     Status: None   Collection Time: 01/25/16  3:29 PM  Result Value Ref Range   Salicylate Lvl <1.6 2.8 - 30.0 mg/dL  Acetaminophen level     Status: Abnormal   Collection Time: 01/25/16  3:29 PM  Result Value Ref Range   Acetaminophen (Tylenol), Serum <10 (L) 10 - 30 ug/mL    Comment:        THERAPEUTIC CONCENTRATIONS VARY SIGNIFICANTLY. A RANGE OF 10-30 ug/mL MAY BE AN EFFECTIVE CONCENTRATION FOR MANY PATIENTS. HOWEVER, SOME ARE BEST TREATED AT CONCENTRATIONS OUTSIDE THIS RANGE. ACETAMINOPHEN CONCENTRATIONS >150 ug/mL AT 4 HOURS AFTER INGESTION AND >50 ug/mL AT 12 HOURS AFTER INGESTION ARE OFTEN ASSOCIATED WITH TOXIC REACTIONS.     Current Facility-Administered Medications  Medication Dose Route Frequency Provider Last Rate Last Dose  . aspirin EC tablet 81 mg  81 mg Oral BH-q7a Earleen Newport, MD   81 mg at 01/26/16 0109  . glyBURIDE (DIABETA) tablet 10 mg  10 mg Oral BID WC Earleen Newport, MD      . lisinopril (PRINIVIL,ZESTRIL) tablet 20 mg  20 mg Oral BH-q7a Earleen Newport, MD   20 mg at 01/26/16 3235   Current  Outpatient Prescriptions  Medication Sig Dispense Refill  . aspirin 81 MG tablet Take 81 mg by mouth every morning.     . glyBURIDE (DIABETA) 5 MG tablet Take 2 tablets (10 mg total) by mouth 2 (two) times daily with a meal. 60 tablet 0  . insulin glargine (LANTUS) 100 UNIT/ML injection Inject 0.65 mLs (65 Units total) into the skin at bedtime. (Patient taking differently: Inject 50 Units into the skin at bedtime. ) 20 mL 0  . lisinopril (PRINIVIL,ZESTRIL) 20 MG tablet Take 20 mg by mouth every morning.    . naproxen (NAPROSYN) 500 MG tablet Take 1 tablet (500 mg total) by mouth 2 (two) times daily with a meal. (Patient not taking: Reported on 01/25/2016) 14 tablet 0    Musculoskeletal: Strength & Muscle Tone: decreased Gait & Station: normal Patient leans: N/A  Psychiatric Specialty Exam: Physical Exam  Nursing note and vitals reviewed. Constitutional: She appears well-developed and well-nourished.  HENT:  Head: Normocephalic and atraumatic.  Eyes: Conjunctivae are normal. Pupils are equal, round, and reactive to light.  Neck: Normal range of motion.  Cardiovascular: Normal heart sounds.   Respiratory: Effort normal. No respiratory distress.  GI: Soft.  Musculoskeletal: Normal range of motion.  Neurological: She is alert.  Skin: Skin is warm and dry.  Psychiatric: Judgment normal. Her affect is blunt. Her speech is delayed. She is slowed. Thought content is not paranoid. Cognition and memory are normal. She expresses no homicidal and no suicidal ideation.    Review of Systems  Constitutional: Negative.   HENT: Negative.   Eyes: Negative.   Respiratory: Negative.   Cardiovascular: Negative.   Gastrointestinal: Negative.   Genitourinary: Positive for frequency.       Patient complains of chronic urinary and fecal incontinence which has been present for several months  Musculoskeletal: Negative.   Skin: Negative.   Neurological: Negative.   Psychiatric/Behavioral: Positive for depression. Negative for hallucinations, memory loss, substance abuse and suicidal ideas. The patient is nervous/anxious.  The patient does not have insomnia.     Blood pressure (!) 160/100, pulse 89, temperature 97.8 F (36.6 C), temperature source Oral, resp. rate 18, height 5' 8"  (1.727 m), weight 117.9 kg (260 lb), SpO2 100 %.Body mass index is 39.53 kg/m.  General Appearance: Casual  Eye Contact:  Fair  Speech:  Slow  Volume:  Decreased  Mood:  Depressed  Affect:  Congruent  Thought Process:  Goal Directed  Orientation:  Full (Time, Place, and Person)  Thought Content:  Logical  Suicidal Thoughts:  No  Homicidal Thoughts:  No  Memory:  Immediate;   Good Recent;   Fair Remote;   Fair  Judgement:  Fair  Insight:  Fair  Psychomotor Activity:  Normal  Concentration:  Concentration: Fair  Recall:  [employer] of Knowledge:  Fair  Language:  Fair  Akathisia:  No  Handed:  Right  AIMS (if indicated):     Assets:  Communication Skills Desire for Improvement Resilience  ADL's:  Intact  Cognition:  WNL  Sleep:        Treatment Plan Summary: Plan Spoke as I said with Dr. Burlene Arnt and also with the patient and with TTS and social work. I do agree that there is probably at least some psychosomatic component to her fecal incontinence especially. As the hospital emergency room doctor points out she has had a pretty clear workup without there being any identified medical reason why she should be suffering from fecal incontinence.  It's possible that her personality and behavior may have contributed to some of this. Sometimes this is an expression of extreme passive hostility. The fact that she is not having any of it since being here in the emergency room would support this somewhat. Nevertheless patient is not having any acute treatable psychiatric symptoms. Doesn't require psychiatric hospitalization or meet commitment criteria. She still mainly needs some kind of discharge placement. Spoke with the patient for a while. She did not seem to be very interested in having a deeper conversation. No indication for  psychiatric medicine. I will continue to follow if needed.  Disposition: Patient does not meet criteria for psychiatric inpatient admission. Supportive therapy provided about ongoing stressors.  Alethia Berthold, MD 01/26/2016 2:14 PM

## 2016-01-26 NOTE — Care Management Note (Signed)
Case Management Note  Patient Details  Name: [name] [name] MRN: [id number] Date of Birth: 1965/08/13  Subjective/Objective:   Follow up call made to Burnell BlanksKimberly Clark at [phone]. No answer still.     Left new VM.            Action/Plan:   Expected Discharge Date:                  Expected Discharge Plan:     In-House Referral:     Discharge planning Services     Post Acute Care Choice:    Choice offered to:     DME Arranged:    DME Agency:     HH Arranged:    HH Agency:     Status of Service:     If discussed at [employer] Length of Stay Meetings, dates discussed:    Additional Comments:  Berna BueCheryl Dalesha Stanback, RN 01/26/2016, 2:07 PM

## 2016-01-26 NOTE — ED Notes (Signed)
Patient in shower 

## 2016-01-26 NOTE — ED Notes (Signed)
Resumed care from Verizonjennifer rn.  Pt resting with eyes  Closed.  Easily aroused   Pt calm.

## 2016-01-26 NOTE — Care Management Note (Signed)
Case Management Note  Patient Details  Name: [name] [name] MRN: [id number] Date of Birth: 1965/06/23  Subjective/Objective:     Was able o leave a vm this am for [name] [name], at [phone].               Action/Plan:   Expected Discharge Date:                  Expected Discharge Plan:     In-House Referral:     Discharge planning Services     Post Acute Care Choice:    Choice offered to:     DME Arranged:    DME Agency:     HH Arranged:    HH Agency:     Status of Service:     If discussed at [employer] Length of Stay Meetings, dates discussed:    Additional Comments:  [name] BueCheryl Jacari Kirsten, RN 01/26/2016, 9:02 AM

## 2016-01-26 NOTE — ED Notes (Signed)
CSW sent the following referrals to Assisted Living facilities for pt;  [facility]; [place]: [phone] F: [phone]  [facility]; [phone] F: [phone]  [facility]; [phone] F: [phone]  [facility]; [phone] F:[phone]  [facility]; [place]:[phone] F: [phone]  [facility]; [phone] F: [phone]  [facility]; P: [phone] F: [phone]  [facility]; [phone] F: [phone]  [facility]; P: [phone] F: [phone]  [facility]; P: [phone] F: [phone]  Jonathon JordanLynn B Braeleigh Pyper, MSW, LCSWA [phone]

## 2016-01-26 NOTE — ED Notes (Signed)
Breakfast was given to patient. 

## 2016-01-27 NOTE — ED Notes (Signed)
CSW received call from [place]isa from [facility] [phone]). [name] DanaFaciltiy is currently reviewing pt and would like additional information. CSW agreed and sent additional information via fax number [phone].   [name] JordanLynn B Matheson [name], MSW, Theresia MajorsLCSWA [phone]

## 2016-01-27 NOTE — ED Notes (Signed)
Pt's friend Fayrene Fearing(James) stopped by to visit pt. CSW was present during the visit. Fayrene FearingJames expressed concern about pt's living situation and discharge plan. Pt also became tearful when discussing discharge plans. CSW explained that pt has been referred out to local facilities and being considered. James asked to be kept updated about pt's status. CSW agreed.   Juliann PulseJames Mitchell phone number: [phone]  Jonathon JordanLynn B Shonya Sumida, MSW, Theresia MajorsLCSWA [phone]

## 2016-01-27 NOTE — Progress Notes (Addendum)
Inpatient Diabetes Program Recommendations  AACE/ADA: New Consensus Statement on Inpatient Glycemic Control (2015)  Target Ranges:  Prepandial:   less than 140 mg/dL      Peak postprandial:   less than 180 mg/dL (1-2 hours)      Critically ill patients:  140 - 180 mg/dL   Lab Results  Component Value Date   GLUCAP 405 (H) 01/26/2016    Review of Glycemic Control  Diabetes history: Type 2 Outpatient Diabetes medications: Glyburide 10mg  bid Current orders for Inpatient glycemic control: Glyburide 10mg  bid  Inpatient Diabetes Program Recommendations:  Patient has a history of diabetes.  Finger stick blood sugar 405mg /dl last night although lab glucose this am 100mg /dl.  Elevated CBG in October and November, 2017.  Please consider ordering finger stick CBG tid and hs.   Please change diet to carb modified/heart healthy.   Check A1C if appropriate-  Per ADA recommendations "consider performing an A1C on all patients with diabetes or hyperglycemia admitted to the hospital if not performed in the prior 3 months".   [name] RacerJulie Alex Leahy, RN, BA, MHA, CDE Diabetes Coordinator Inpatient Diabetes Program  [phone] (Team Pager) [phone] [facility] Office) 01/27/2016 2:07 PM

## 2016-01-27 NOTE — Care Management Note (Signed)
Case Management Note  Patient Details  Name: Jerilee HohJennifer R Creasman MRN: [id number] Date of Birth: June 17, 1965  Subjective/Objective:      Spoke to Burnell Bl[facility]Kimberly Clark at [facility] about the [facility] and her situation. She is going to try to reach out to [employer] and see if there is anything they could do, and she will touch base with us tomorrow. We have agreed to keep in touch until there is a resolution for the patient.              Action/Plan:   Expected Discharge Date:                  Expected Discharge Plan:     In-House Referral:     Discharge planning Services     Post Acute Care Choice:    Choice offered to:     DME Arranged:    DME Agency:     HH Arranged:    HH Agency:     Status of Service:     If discussed at [employer] Length of Stay Meetings, dates discussed:    Additional Comments:  Berna BueCheryl Maddon Horton, RN 01/27/2016, 2:27 PM

## 2016-01-27 NOTE — ED Notes (Signed)
Pt assisted back to room by tech. Pt repositioned in bed.

## 2016-01-27 NOTE — ED Notes (Signed)
IVC/Pending placement 

## 2016-01-27 NOTE — ED Notes (Signed)
Dinner given

## 2016-01-27 NOTE — ED Notes (Signed)
Pt currently lying in bed watching tv/ I introduced self to patient, pt was reminded that we would do a tolieting schedule every other hour. Pt agreed to this. Pt calm and cooperative at this time. Environment is safe and nothing is needed from staff at this time

## 2016-01-27 NOTE — ED Notes (Signed)
Pt ambulated to bathroom with assistance from [place], [place].

## 2016-01-27 NOTE — ED Notes (Signed)
Pt assisted to the bathroom. Pt's brief and linen changed. Pt given warm blanket.

## 2016-01-27 NOTE — ED Notes (Signed)
Lunch provided.

## 2016-01-27 NOTE — ED Notes (Signed)
Pt clothing and bedding changed at this time. 

## 2016-01-27 NOTE — ED Notes (Signed)
Patient in shower 

## 2016-01-27 NOTE — ED Notes (Signed)
Patient took morning medications without difficulty.

## 2016-01-27 NOTE — ED Notes (Signed)
Pt was given a new pair of pants and brief to change with. Pt able to change her own clothing. Bed was soaked with urine. This tech cleaned the bed and re-applied new linens for pt. Nothing needed from patient at this time. Pt showing no signs of distress.

## 2016-01-27 NOTE — ED Provider Notes (Signed)
-----------------------------------------   7:03 AM on 01/27/2016 -----------------------------------------   Blood pressure (!) 163/108, pulse 83, temperature 98.6 F (37 C), temperature source Oral, resp. rate 18, height 5\' 8"  (1.727 m), weight 260 lb (117.9 kg), SpO2 100 %.  The patient had no acute events since last update.  Calm and cooperative at this time.  Disposition is pending Psychiatry/Behavioral Medicine team recommendations.     Willy EddyPatrick Jaqwan Wieber, MD 01/27/16 [phone]

## 2016-01-28 LAB — GLUCOSE, CAPILLARY: GLUCOSE-CAPILLARY: 215 mg/dL — AB (ref 65–99)

## 2016-01-28 NOTE — ED Notes (Signed)
Attempted to call Diabetes Counselor back [phone]  but got no answer   FSBS this am 219  MD informed of note placed in chart  Orders to follow

## 2016-01-28 NOTE — ED Notes (Signed)
Pt with a phone call from her son at this time   BEHAVIORAL HEALTH ROUNDING Patient sleeping: No. Patient alert and oriented: yes Behavior appropriate: Yes.  ; If no, describe:  Nutrition and fluids offered: yes Toileting and hygiene offered: Yes  Sitter present: q15 minute observations and security camera monitoring Law enforcement present: Yes  ODS

## 2016-01-28 NOTE — Progress Notes (Signed)
Inpatient Diabetes Program Recommendations  AACE/ADA: New Consensus Statement on Inpatient Glycemic Control (2015)  Target Ranges:  Prepandial:   less than 140 mg/dL      Peak postprandial:   less than 180 mg/dL (1-2 hours)      Critically ill patients:  140 - 180 mg/dL   Results for [name] [name], [name] [name] (MRN [id number]) as of 01/28/2016 08:03  Ref. Range 01/26/2016 20:22  Glucose-Capillary Latest Ref Range: 65 - 99 mg/dL 147405 (H)  Results for [name] [name], [name] [name] (MRN [id number]) as of 01/28/2016 08:03  Ref. Range 01/25/2016 15:29  Glucose Latest Ref Range: 65 - 99 mg/dL 865100 (H)   Review of Glycemic Control  Diabetes history: DM2 Outpatient Diabetes medications: Lantus 50 units QHS, Glyburide 10 mg BID Current orders for Inpatient glycemic control: Glyburide 10 mg BID  Inpatient Diabetes Program Recommendations: Insulin-Basal: If glucose is >200 mg/dl when checked this morning, please order at least Lantus 25 units Q24H starting now. Correction (SSI): Please use Glycemic Control order set to order CBGs with Novolog 0-20 units ACHS correction scale. HgbA1C: Please order an A1C to evaluate glycemic control over the past 2-3 months.  Thanks, Orlando PennerMarie Nathanyl Andujo, RN, MSN, CDE Diabetes Coordinator Inpatient Diabetes Program [phone] (Team Pager from 8am to 5pm)

## 2016-01-28 NOTE — ED Notes (Signed)
Patient woke up soaked in urine. After sanitizing her bed and changing all bed linens, assisted patient in using CHG bath wipes and changing her scrubs.

## 2016-01-28 NOTE — ED Notes (Signed)
BEHAVIORAL HEALTH ROUNDING Patient sleeping: No. Patient alert and oriented: yes Behavior appropriate: Yes.  ; If no, describe:  Nutrition and fluids offered: yes Toileting and hygiene offered: Yes  Sitter present: q15 minute observations and security  monitoring Law enforcement present: Yes  ODS  

## 2016-01-28 NOTE — ED Notes (Signed)
Pt clothing and bedding changed at this time.

## 2016-01-28 NOTE — Progress Notes (Signed)
LCSW called [facility] and left message for admissions. No one returned call

## 2016-01-28 NOTE — ED Notes (Signed)
Pt given meal tray and juice. 

## 2016-01-28 NOTE — ED Notes (Signed)
Patient is voluntary and is pending placement. 

## 2016-01-28 NOTE — ED Notes (Signed)
ED  Is the patient under IVC or is there intent for IVC:   NO Is the patient medically cleared: Yes.   Is there vacancy in the ED BHU:  Is the population mix appropriate for patient:  Is the patient awaiting placement in inpatient or outpatient setting: Yes.   Has the patient had a psychiatric consult: Yes.  Pt cleared by psychiatrist - attempts are being made to place pt in assisted living   Survey of unit performed for contraband, proper placement and condition of furniture, tampering with fixtures in bathroom, shower, and each patient room: Yes.  ; Findings:  APPEARANCE/BEHAVIOR Calm and cooperative NEURO ASSESSMENT Orientation: oriented x3  Denies pain Hallucinations: No.None noted (Hallucinations) Speech: Normal Gait: normal RESPIRATORY ASSESSMENT Even  Unlabored respirations  CARDIOVASCULAR ASSESSMENT Pulses equal   regular rate  Skin warm and dry   GASTROINTESTINAL ASSESSMENT no GI complaint EXTREMITIES Full ROM  PLAN OF CARE Provide calm/safe environment. Vital signs assessed twice daily. ED BHU Assessment once each 12-hour shift. Collaborate with daily or as condition indicates. Assure the ED provider has rounded once each shift. Provide and encourage hygiene. Provide redirection as needed. Assess for escalating behavior; address immediately and inform ED provider.  Assess family dynamic and appropriateness for visitation as needed: Yes.  ; If necessary, describe findings:  Educate the patient/family about BHU procedures/visitation: Yes.  ; If necessary, describe findings:

## 2016-01-28 NOTE — ED Notes (Signed)

## 2016-01-28 NOTE — ED Notes (Signed)
Danelle EarthlyNoel RN made aware of pt bp.

## 2016-01-28 NOTE — ED Notes (Signed)
Spoke with MD again about diabetes note in chart - EDP states to monitor CBG daily due to pt now being back on her PO meds  - Lantus 50 daily was not ordered at this time nor was a sliding scale  Continue to monitor

## 2016-01-29 LAB — GLUCOSE, CAPILLARY: GLUCOSE-CAPILLARY: 295 mg/dL — AB (ref 65–99)

## 2016-01-29 MED ORDER — CEPHALEXIN 500 MG PO CAPS
500.0000 mg | ORAL_CAPSULE | Freq: Two times a day (BID) | ORAL | Status: AC
  Administered 2016-01-29 – 2016-01-31 (×6): 500 mg via ORAL
  Filled 2016-01-29 (×6): qty 1

## 2016-01-29 NOTE — ED Provider Notes (Signed)
-----------------------------------------   7:16 AM on 01/29/2016 -----------------------------------------   Blood pressure (!) 183/110, pulse 94, temperature 98.1 F (36.7 C), temperature source Oral, resp. rate 20, height 5\' 8"  (1.727 m), weight 260 lb (117.9 kg), SpO2 100 %.  The patient had no acute events since last update.  Calm and cooperative at this time.    Urinalysis consistent with UTI. Urine culture sent. Patient started on 3 day course of Keflex.  Disposition is pending Psychiatry/Behavioral Medicine team recommendations.     Sharman CheekPhillip Alexus Galka, MD 01/29/16 [phone]

## 2016-01-29 NOTE — ED Notes (Signed)
Pt. Requested to use bathroom and change of scrubs.  Pt. Given new scrubs.  Pt. Was able to ambulate to bathroom and change herself with no issues.

## 2016-01-29 NOTE — Progress Notes (Signed)
LCSW called [facility] and patient was turned down as patient does not meet the age criteria of [age].  LCSW and EDP consulted and it was explained she was turned away from shelter due to incontinence and they felt she needed higher level of care.  LCSW called [facility] to get further clarification if they have assigned a worker. Called weekend  [facility] [phone]  and awaiting a call back.   Delta Air LinesClaudine Aeden Matranga LCSW [phone]

## 2016-01-30 LAB — GLUCOSE, CAPILLARY
GLUCOSE-CAPILLARY: 106 mg/dL — AB (ref 65–99)
Glucose-Capillary: 281 mg/dL — ABNORMAL HIGH (ref 65–99)

## 2016-01-30 NOTE — Progress Notes (Signed)
LCSW met with patient and she is under no terms allowed to return to her cousin [facility].   LCSW will call [facility], [facility] and [facility] to see if they have beds.   [employer] LCSW [phone]

## 2016-01-30 NOTE — ED Notes (Signed)
Pt eating dinner at this time

## 2016-01-30 NOTE — Progress Notes (Signed)
[facility]- has no beds ( people may come in from the cold and will have access to a chair and table top.  [facility]  [address] [place] ( NO beds unless pt has addiction issues)  [facility] [phone] ext [phone] and left detailed message  Delta Air LinesClaudine Allex Madia LCSW [phone]

## 2016-01-30 NOTE — ED Notes (Signed)
Patient is voluntary and is pending placement. 

## 2016-01-30 NOTE — ED Provider Notes (Signed)
-----------------------------------------   6:57 AM on 01/30/2016 -----------------------------------------   Blood pressure 124/81, pulse 98, temperature 98.2 F (36.8 C), temperature source Oral, resp. rate 16, height 5\' 8"  (1.727 m), weight 260 lb (117.9 kg), SpO2 97 %.  The patient had no acute events since last update.  Calm and cooperative at this time.  Disposition is pending Psychiatry/Behavioral Medicine team recommendations.     Rebecka ApleyAllison P Shannell Mikkelsen, MD 01/30/16 [phone]

## 2016-01-30 NOTE — Progress Notes (Signed)
LCSW called several [facility] to see if she could be placed with them and there were no  Bed offers. LCSW called her friend Fayrene FearingJames [phone] and he reported that she might be able to return to her [facility] that was were she was staying before. He is not willing to take her in ( she is his ex girlfriend).  LCSW will call women shelters in [place]   [name] Deyna Carbon LCSW [phone]

## 2016-01-30 NOTE — ED Notes (Signed)
Pt sitting up eating breakfast at this time.

## 2016-01-30 NOTE — ED Notes (Signed)
Pt given beverage per request 

## 2016-01-31 LAB — GLUCOSE, CAPILLARY
GLUCOSE-CAPILLARY: 292 mg/dL — AB (ref 65–99)
GLUCOSE-CAPILLARY: 233 mg/dL — AB (ref 65–99)
Glucose-Capillary: 201 mg/dL — ABNORMAL HIGH (ref 65–99)
Glucose-Capillary: 314 mg/dL — ABNORMAL HIGH (ref 65–99)

## 2016-01-31 MED ORDER — LISINOPRIL 10 MG PO TABS
10.0000 mg | ORAL_TABLET | Freq: Once | ORAL | Status: AC
  Administered 2016-01-31: 10 mg via ORAL

## 2016-01-31 MED ORDER — LISINOPRIL 10 MG PO TABS
ORAL_TABLET | ORAL | Status: AC
  Filled 2016-01-31: qty 1

## 2016-01-31 NOTE — ED Provider Notes (Signed)
-----------------------------------------   6:57 AM on 01/31/2016 -----------------------------------------   Blood pressure (!) 144/93, pulse 85, temperature 98.7 F (37.1 C), temperature source Oral, resp. rate 18, height 5\' 8"  (1.727 m), weight 260 lb (117.9 kg), SpO2 99 %.  The patient had no acute events since last update.  Calm and cooperative at this time.  Disposition is pending Psychiatry/Behavioral Medicine team recommendations.     Rebecka ApleyAllison P Webster, MD 01/31/16 [phone]

## 2016-01-31 NOTE — ED Notes (Signed)
MD notified of Pt's BP

## 2016-01-31 NOTE — ED Notes (Signed)
BEHAVIORAL HEALTH ROUNDING Patient sleeping: No. Patient alert and oriented: yes Behavior appropriate: Yes.  ; If no, describe:  Nutrition and fluids offered: Yes  Toileting and hygiene offered: Yes  Sitter present: q 15 min checks Law enforcement present: Yes  

## 2016-01-31 NOTE — ED Notes (Signed)

## 2016-02-01 LAB — GLUCOSE, CAPILLARY
Glucose-Capillary: 152 mg/dL — ABNORMAL HIGH (ref 65–99)
Glucose-Capillary: 253 mg/dL — ABNORMAL HIGH (ref 65–99)

## 2016-02-01 MED ORDER — CLONIDINE HCL 0.1 MG PO TABS
0.1000 mg | ORAL_TABLET | Freq: Once | ORAL | Status: AC
Start: 2016-02-01 — End: 2016-02-01
  Administered 2016-02-01: 0.1 mg via ORAL

## 2016-02-01 MED ORDER — CLONIDINE HCL 0.1 MG PO TABS
0.2000 mg | ORAL_TABLET | Freq: Once | ORAL | Status: DC
  Filled 2016-02-01: qty 2

## 2016-02-01 MED ORDER — AMLODIPINE BESYLATE 5 MG PO TABS
5.0000 mg | ORAL_TABLET | Freq: Every day | ORAL | Status: DC
  Administered 2016-02-02 (×2): 5 mg via ORAL
  Filled 2016-02-01 (×2): qty 1

## 2016-02-01 NOTE — ED Notes (Signed)
BEHAVIORAL HEALTH ROUNDING Patient sleeping: No. Patient alert and oriented: yes Behavior appropriate: Yes.  ; If no, describe:  Nutrition and fluids offered: yes Toileting and hygiene offered: Yes  Sitter present: q15 minute observations and security  monitoring Law enforcement present: Yes  ODS  

## 2016-02-01 NOTE — ED Provider Notes (Signed)
-----------------------------------------   3:26 AM on 02/01/2016 -----------------------------------------   Blood pressure (!) 202/115, pulse 77, temperature 98.9 F (37.2 C), temperature source Oral, resp. rate 16, height 5\' 8"  (1.727 m), weight 260 lb (117.9 kg), SpO2 100 %.  The patient had no acute events since last update.  Calm and cooperative at this time.  Clinical social workers currently assisting with patient placement. No definitive placement identified as of yet.     Minna AntisKevin Lydiah Pong, MD 02/01/16 [phone]

## 2016-02-01 NOTE — ED Notes (Signed)
Pt clothing changed as well as linen by this tech

## 2016-02-01 NOTE — ED Notes (Signed)
Spoke with Dr Scotty CourtStafford in regards to patient's BP being chronically high during visit, changed order to 0.1mg  PO clonidine for now and recheck BP in 1 hour.

## 2016-02-01 NOTE — ED Notes (Signed)
Pt repositioned in bed with assistance of this tech

## 2016-02-01 NOTE — ED Notes (Signed)

## 2016-02-01 NOTE — ED Notes (Signed)
MD made aware of diabetes centers referral note in the computer - this is my second attempt of informing the doctors of notes in the computer concerning her diabetes management

## 2016-02-01 NOTE — Progress Notes (Signed)
Inpatient Diabetes Program Recommendations  AACE/ADA: New Consensus Statement on Inpatient Glycemic Control (2015)  Target Ranges:  Prepandial:   less than 140 mg/dL      Peak postprandial:   less than 180 mg/dL (1-2 hours)      Critically ill patients:  140 - 180 mg/dL   Results for [name] [name], Jalise R (MRN [id number]) as of 02/01/2016 11:41  Ref. Range 01/31/2016 08:17 01/31/2016 12:51 01/31/2016 17:57 01/31/2016 21:57  Glucose-Capillary Latest Ref Range: 65 - 99 mg/dL 119201 (H) 147314 (H) 829292 (H) 233 (H)    Review of Glycemic Control  Diabetes history: DM2 Outpatient Diabetes medications: Lantus 50 units QHS, Glyburide 10 mg BID Current orders for Inpatient glycemic control: Glyburide 10 mg BID  Inpatient Diabetes Program Recommendations: Insulin-Basal: Please consider ordering at least Lantus 10 units Q24H starting now. Correction (SSI): Please use Glycemic Control order set to order CBGs with Novolog 0-20 units TID with meals and Novolog 0-5 units QHS for correction scale. HgbA1C: Please order an A1C to evaluate glycemic control over the past 2-3 months.  Called ER and spoke with Amy, RN and asked that she discuss recommendations with MD.  Thanks, Orlando PennerMarie Micah Barnier, RN, MSN, CDE Diabetes Coordinator Inpatient Diabetes Program [phone] (Team Pager from 8am to 5pm)

## 2016-02-01 NOTE — ED Notes (Signed)
EDP notified of pt's blood pressure, VO received for clonidine 0.2mg  PO.

## 2016-02-01 NOTE — ED Notes (Signed)
Pt observed with no unusual behavior  Appropriate to stimulation  No verbalized needs or concerns at this time  NAD assessed  Continue to monitor 

## 2016-02-01 NOTE — [place] FL2 (Signed)
  [place] MEDICAID FL2 LEVEL OF CARE SCREENING TOOL     IDENTIFICATION  Patient Name: [name] HohJennifer R Licausi Birthdate: 10-Jul-1965 Sex: female Admission Date (Current Location): 01/25/2016  [place]  [place] [id number] [facility]  [facility], [address], [place], [place] [address]      Provider Number: [id number]  Attending Physician Name and Address:  No att. providers found  Relative Name and Phone Number:       Current Level of Care: Hospital Recommended Level of Care: Family Care Home Prior Approval Number:    Date Approved/Denied:   PASRR Number:    Discharge Plan: Domiciliary (Rest home)    Current Diagnoses: Patient Active Problem List   Diagnosis Date Noted  . Urinary incontinence 01/25/2016  . Fecal incontinence 01/25/2016  . Dysthymia 01/25/2016    Orientation RESPIRATION BLADDER Height & Weight     Self, Time, Situation, Place  Normal Incontinent Weight: 260 lb (117.9 kg) Height:  5\' 8"  (172.7 cm)  BEHAVIORAL SYMPTOMS/MOOD NEUROLOGICAL BOWEL NUTRITION STATUS     (None) Continent Diet (Regular)  AMBULATORY STATUS COMMUNICATION OF NEEDS Skin   Independent Verbally Normal                       Personal Care Assistance Level of Assistance  Bathing, Feeding, Dressing Bathing Assistance: Independent Feeding assistance: Independent Dressing Assistance: Independent     Functional Limitations Info  Sight, Hearing, Speech Sight Info: Adequate Hearing Info: Adequate Speech Info: Adequate    SPECIAL CARE FACTORS FREQUENCY                       Contractures Contractures Info: Not present    Additional Factors Info  Code Status, Allergies Code Status Info: Not on file Allergies Info: Latex           Current Medications (02/01/2016):  This is the current hospital active medication list Current Facility-Administered Medications  Medication Dose Route Frequency Provider Last  Rate Last Dose  . aspirin EC tablet 81 mg  81 mg Oral BH-q7a Emily FilbertJonathan E Williams, MD   81 mg at 02/01/16 1058  . glyBURIDE (DIABETA) tablet 10 mg  10 mg Oral BID WC Emily FilbertJonathan E Williams, MD   10 mg at 02/01/16 1059  . lisinopril (PRINIVIL,ZESTRIL) tablet 20 mg  20 mg Oral BH-q7a Emily FilbertJonathan E Williams, MD   20 mg at 02/01/16 1059   Current Outpatient Prescriptions  Medication Sig Dispense Refill  . aspirin 81 MG tablet Take 81 mg by mouth every morning.    . glyBURIDE (DIABETA) 5 MG tablet Take 2 tablets (10 mg total) by mouth 2 (two) times daily with a meal. 60 tablet 0  . insulin glargine (LANTUS) 100 UNIT/ML injection Inject 0.65 mLs (65 Units total) into the skin at bedtime. (Patient taking differently: Inject 50 Units into the skin at bedtime. ) 20 mL 0  . lisinopril (PRINIVIL,ZESTRIL) 20 MG tablet Take 20 mg by mouth every morning.    . naproxen (NAPROSYN) 500 MG tablet Take 1 tablet (500 mg total) by mouth 2 (two) times daily with a meal. (Patient not taking: Reported on 01/25/2016) 14 tablet 0     Discharge Medications: Please see discharge summary for a list of discharge medications.  Relevant Imaging Results:  Relevant Lab Results:   Additional Information SSN: [id number]  Jonathon JordanLynn B Rikki Trosper, LCSWA

## 2016-02-01 NOTE — ED Notes (Signed)
Helped pt to bathroom. Changed linens on bed and helped change pt clothes.

## 2016-02-01 NOTE — ED Notes (Signed)
Pt has not received any bed offers from Assisted Living facilities due to lack of Medicaid bed availability and due to age requirements at some facilities.   CSW has completed a new FL2 and [facility];   [facility] [place] [phone] Rowan BlaseLawanda is reviewing pt's case and may be interested in coming to visit pt in the ED today. CSW will call back later to confirm.  [facility], P: [phone] Per Misty StanleyLisa no beds available.   [facility]; P: [phone] No answer, left a message for [facility]. CSW will try again later.  [facility], P: [phone] Jasmine DecemberSharon is currently reviewing pt's case.  Jonathon JordanLynn B Ary Lavine, MSW, Theresia MajorsLCSWA [phone]

## 2016-02-01 NOTE — ED Notes (Signed)

## 2016-02-01 NOTE — ED Notes (Signed)
Pt ate breakfast and is showering

## 2016-02-01 NOTE — ED Notes (Signed)
ED BHU PLACEMENT JUSTIFICATION Is the patient under IVC or is there intent for IVC: Yes.   Is the patient medically cleared: Yes.   Is there vacancy in the ED BHU: Yes.   Is the population mix appropriate for patient: Yes.   Is the patient awaiting placement in inpatient or outpatient setting: Yes.  Assisted living placement  Has the patient had a psychiatric consult: Yes.   Survey of unit performed for contraband, proper placement and condition of furniture, tampering with fixtures in bathroom, shower, and each patient room: Yes.  ; Findings:  APPEARANCE/BEHAVIOR Calm and cooperative NEURO ASSESSMENT Orientation: oriented x4  Denies pain Hallucinations: No.None noted (Hallucinations) Speech: Normal Gait: normal RESPIRATORY ASSESSMENT Even  Unlabored respirations  CARDIOVASCULAR ASSESSMENT Pulses equal   regular rate  Skin warm and dry   GASTROINTESTINAL ASSESSMENT no GI complaint EXTREMITIES Full ROM  PLAN OF CARE Provide calm/safe environment. Vital signs assessed twice daily. ED BHU Assessment once each 12-hour shift. Collaborate with TTS daily or as condition indicates. Assure the ED provider has rounded once each shift. Provide and encourage hygiene. Provide redirection as needed. Assess for escalating behavior; address immediately and inform ED provider.  Assess family dynamic and appropriateness for visitation as needed: Yes.  ; If necessary, describe findings:  Educate the patient/family about BHU procedures/visitation: Yes.  ; If necessary, describe findings:

## 2016-02-01 NOTE — ED Notes (Signed)
Pt refused this am

## 2016-02-01 NOTE — ED Notes (Signed)
Pt refused to allow her cbg to be taken and then she refused her glyburide  Continue to monitor and encourage her to care for herself and take her meds

## 2016-02-02 LAB — GLUCOSE, CAPILLARY
Glucose-Capillary: 220 mg/dL — ABNORMAL HIGH (ref 65–99)
Glucose-Capillary: 235 mg/dL — ABNORMAL HIGH (ref 65–99)

## 2016-02-02 MED ORDER — LISINOPRIL 20 MG PO TABS: 20.0000 mg | ORAL_TABLET | ORAL | 1 refills | Status: DC

## 2016-02-02 MED ORDER — NAPROXEN 500 MG PO TABS: 500.0000 mg | ORAL_TABLET | Freq: Two times a day (BID) | ORAL | 0 refills | Status: AC | PRN

## 2016-02-02 MED ORDER — AMLODIPINE BESYLATE 5 MG PO TABS: 5.0000 mg | ORAL_TABLET | Freq: Every day | ORAL | 1 refills | Status: DC

## 2016-02-02 MED ORDER — GLYBURIDE 5 MG PO TABS: 10.0000 mg | ORAL_TABLET | Freq: Every day | ORAL | 1 refills | Status: DC

## 2016-02-02 MED ORDER — INSULIN ASPART 100 UNIT/ML ~~LOC~~ SOLN: SUBCUTANEOUS | 3 refills | Status: DC

## 2016-02-02 MED ORDER — ASPIRIN EC 81 MG PO TBEC: 81.0000 mg | DELAYED_RELEASE_TABLET | Freq: Every day | ORAL | 1 refills | Status: AC

## 2016-02-02 MED ORDER — BLOOD GLUCOSE MONITOR KIT: PACK | 0 refills | Status: DC

## 2016-02-02 MED ORDER — TUBERCULIN PPD 5 UNIT/0.1ML ID SOLN
5.0000 [IU] | Freq: Once | INTRADERMAL | Status: DC
  Administered 2016-02-02: 5 [IU] via INTRADERMAL
  Filled 2016-02-02: qty 0.1

## 2016-02-02 NOTE — ED Provider Notes (Signed)
-----------------------------------------   3:43 PM on 02/02/2016 -----------------------------------------  Group home is here speaking with the patient, and they will be accepting the patient to their facility. I have written for the patient's home medications including NovoLog until the patient was seen by her primary care doctor and started on a long-acting insulin. Group home provider is comfortable with this plan of care as well as checking blood sugars and administering insulin.   Minna AntisKevin Tyniya Kuyper, MD 02/02/16 [phone]

## 2016-02-02 NOTE — ED Notes (Signed)
Pt has been [facility] [place], [place]. [name] Ray's coworker Maren ReamerWanda Phillips [phone]) came to visit the pt and have found pt to be more fitting for [facility] property instead of their [facility] property.   Signed FL2 has been provided to [place] along with other discharge paperwork. RN has ordered PPD. Pt will discharge today. EDP and psychiatrist are aware and are in support of the plan. Pt has also stated that she is agreeable to the discharge plan.  CSW will continue to follow pt and assist with d/c.  Jonathon JordanLynn B Tabrina Esty, MSW, Theresia MajorsLCSWA [phone]

## 2016-02-02 NOTE — Progress Notes (Signed)
Inpatient Diabetes Program Recommendations  AACE/ADA: New Consensus Statement on Inpatient Glycemic Control (2015)  Target Ranges:  Prepandial:   less than 140 mg/dL      Peak postprandial:   less than 180 mg/dL (1-2 hours)      Critically ill patients:  140 - 180 mg/dL   Results for [name] [name], [name] [name] (MRN [id number]) as of 02/02/2016 12:29  Ref. Range 01/31/2016 17:57 01/31/2016 21:57 02/01/2016 12:47 02/01/2016 21:39 02/02/2016 08:12  Glucose-Capillary Latest Ref Range: 65 - 99 mg/dL 409292 (H) 811233 (H) 914152 (H) 253 (H) 220 (H)    Review of Glycemic Control  Diabetes history:DM2 Outpatient Diabetes medications: Lantus 50 units QHS, Glyburide 10 mg BID Current orders for Inpatient glycemic control: Glyburide 10 mg BID  Inpatient Diabetes Program Recommendations: Insulin-Basal: Please consider ordering at least Lantus 10 units Q24H starting now. Correction (SSI):Please use Glycemic Control order set to order CBGs with Novolog 0-20 units TID with meals and Novolog 0-5 units QHS for correction scale. HgbA1C:Please order an A1C to evaluate glycemic control over the past 2-3 months.  Thanks, Orlando PennerMarie Shaniya Tashiro, RN, MSN, CDE Diabetes Coordinator Inpatient Diabetes Program [phone] (Team Pager from 8am to 5pm)

## 2016-02-02 NOTE — ED Notes (Signed)
CSW met with pt's cousin, Chrissie Noa, and addressed concerns he has about the pt. Chrissie Noa states that he is not willing to take pt into his care at this time. However, he is willing to help in any other way that he can. CSW was then present for the meeting with Chrissie Noa and the pt. Pt became tearful during the interaction and thanked her cousin for coming.  After pt's cousin left, CSW met with pt privately. Pt states that she would like to be discharged because she plans to apply for an apartment complex near the hospital that she can afford. Pt states she receives money each month that would be enough to cover the rent at the apartment complex.   CSW called [name] ([facility] [phone]) and [name] is interested in visiting pt in the ED today around 2pm. CSW agreed to this.  Pt is also still under review with Ivin Booty ([place] [phone]). CSW called for an update on pt's referral. There was no answer and Sharon's voicemail is currently full. CSW will try again later.  If pt is declined by [facility], CSW will discuss with Dr. Weber Cooks the possibility of pt discharging to pursue independent living at the apartment complex.  CSW will continue to follow pt and assist as needed.  Georga Kaufmann, MSW, [place]

## 2016-02-02 NOTE — ED Notes (Signed)
BEHAVIORAL HEALTH ROUNDING Patient sleeping: Yes.   Patient alert and oriented: eyes closed  Appears to be asleep Behavior appropriate: Yes.  ; If no, describe:  Nutrition and fluids offered:  sleeping Toileting and hygiene offered: sleeping Sitter present: q 15 minute observations and security camera monitoring Law enforcement present: yes  ODS

## 2016-02-02 NOTE — ED Notes (Signed)
BEHAVIORAL HEALTH ROUNDING Patient sleeping: No. Patient alert and oriented: yes Behavior appropriate: Yes.  ; If no, describe:  Nutrition and fluids offered: yes Toileting and hygiene offered: Yes  Sitter present: q15 minute observations and security  monitoring Law enforcement present: Yes  ODS  

## 2016-02-02 NOTE — ED Provider Notes (Signed)
-----------------------------------------   7:00 AM on 02/02/2016 -----------------------------------------   Blood pressure (!) 158/94, pulse 88, temperature 98.4 F (36.9 C), temperature source Oral, resp. rate 18, height 5\' 8"  (1.727 m), weight 260 lb (117.9 kg), SpO2 100 %.  The patient had no acute events since last update.  Calm and cooperative at this time.  Disposition is pending Psychiatry/Behavioral Medicine team recommendations.     Jennye MoccasinBrian S Quigley, MD 02/02/16 0700

## 2016-02-02 NOTE — ED Notes (Signed)
Pt transported safely to the lobby and has been discharged to the care of Maren ReamerWanda Phillips from [facility] [phone] PPD was placed and will be read at the care home after 48 hrs. Pt had no complaints upon discharge.   Jonathon JordanLynn B Delmas Faucett, MSW, Theresia MajorsLCSWA [phone]

## 2016-02-02 NOTE — ED Notes (Signed)
Pt observed sitting up on the side of the bed  I introduced myself to her again this am   NAD observed  She denies pain toileting offered No verbalized needs or concerns at this time

## 2016-02-02 NOTE — ED Notes (Signed)
ED BHU PLACEMENT JUSTIFICATION Is the patient under IVC or is there intent for IVC: Yes.   Is the patient medically cleared: Yes.   Is there vacancy in the ED BHU: Yes.   Is the population mix appropriate for patient: Yes.   Is the patient awaiting placement in inpatient or outpatient setting: Yes.  Assisted living placement  Has the patient had a psychiatric consult: Yes.   Survey of unit performed for contraband, proper placement and condition of furniture, tampering with fixtures in bathroom, shower, and each patient room: Yes.  ; Findings:  APPEARANCE/BEHAVIOR Calm and cooperative NEURO ASSESSMENT Orientation: oriented x3  Not time  Denies pain Hallucinations: No.None noted (Hallucinations) Speech: Normal Gait: normal RESPIRATORY ASSESSMENT Even  Unlabored respirations  CARDIOVASCULAR ASSESSMENT Pulses equal   regular rate  Skin warm and dry   GASTROINTESTINAL ASSESSMENT no GI complaint EXTREMITIES Full ROM  PLAN OF CARE Provide calm/safe environment. Vital signs assessed twice daily. ED BHU Assessment once each 12-hour shift. Collaborate with TTS daily or as condition indicates. Assure the ED provider has rounded once each shift. Provide and encourage hygiene. Provide redirection as needed. Assess for escalating behavior; address immediately and inform ED provider.  Assess family dynamic and appropriateness for visitation as needed: Yes.  ; If necessary, describe findings:  Educate the patient/family about BHU procedures/visitation: Yes.  ; If necessary, describe findings:

## 2016-02-02 NOTE — ED Notes (Signed)

## 2016-02-23 NOTE — ED Provider Notes (Signed)
Delayed EKG read; I read this EKG on the day it was performed.  Here is the interpretation:  ED ECG REPORT I, Rockne MenghiniNorman, Anne-Caroline, the attending physician, personally viewed and interpreted this ECG.   Date: 02/23/2016  EKG Time: 1706  Rate: 77  Rhythm: normal sinus rhythm  Axis: normal  Intervals:none  ST&T Change: No STEMI; poor baseline tracing.    Rockne MenghiniAnne-Caroline Trevan Messman, MD 02/23/16 2330

## 2017-01-21 IMAGING — MR MR LUMBAR SPINE W/O CM
4 of 6 series · 31 of 48 positions shown · non-contrast
Comparison: None.

CLINICAL DATA: 49 y/o F; urinary incontinence, elevated blood
sugar, and hypertension.

EXAM:
MRI LUMBAR SPINE WITHOUT CONTRAST
TECHNIQUE: Multiplanar, multisequence MR imaging of the lumbar spine was
performed. No intravenous contrast was administered.

[Series 3: T2 · sagittal · 4.0mm · 0.81mm/px · 5 of 17 slices shown (1 of 2)]
[im 1/17]
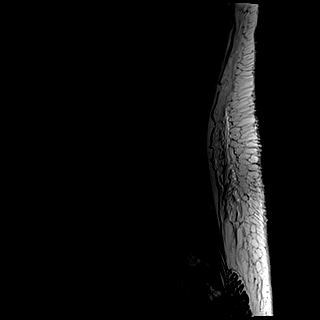
[im 5/17]
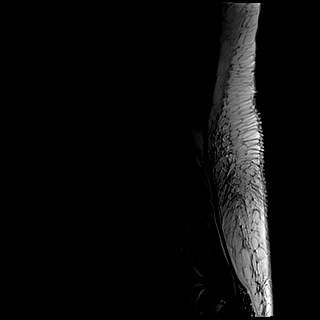
[im 9/17]
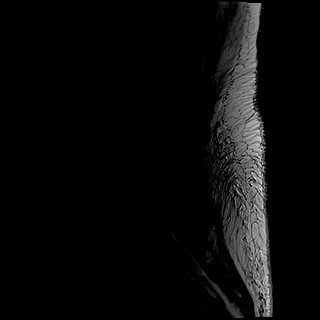
[im 13/17]
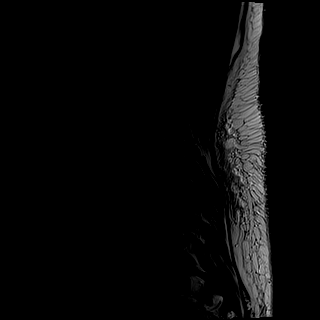
[im 17/17]
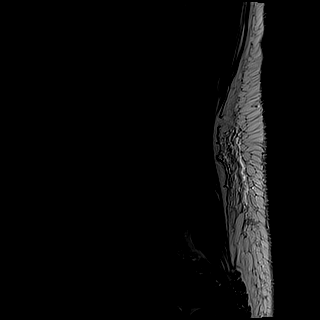

[Series 4: T1 · sagittal · 4.0mm · 0.81mm/px · 5 of 17 slices shown (1 of 2)]
[im 1/17]
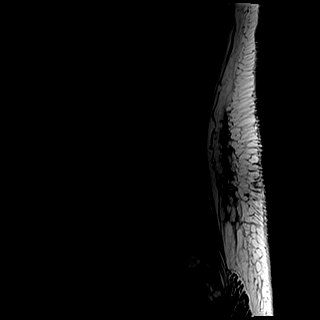
[im 5/17]
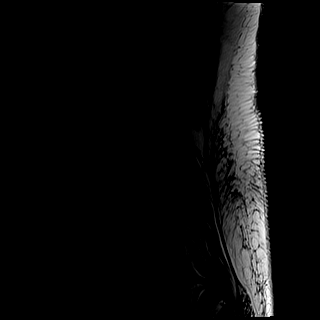
[im 9/17]
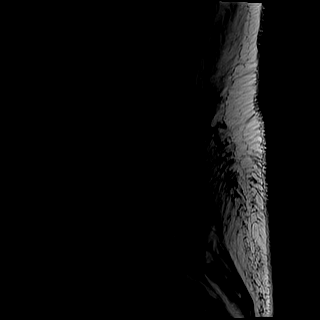
[im 13/17]
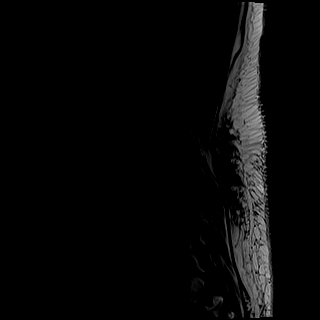
[im 17/17]
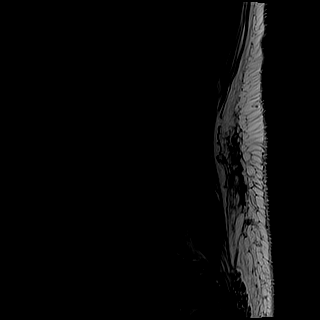

[Series 6: T2 · axial · 4.0mm · 0.78mm/px · z∈[-40,+187]mm · 11 of 41 slices shown (2 of 2)]
[im 1/41]
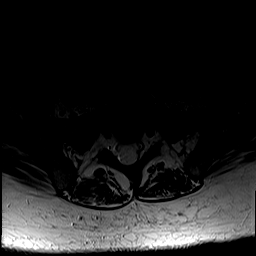
[im 5/41]
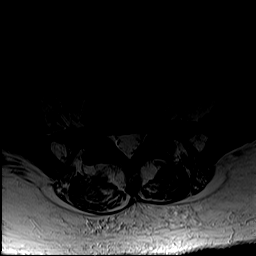
[im 9/41]
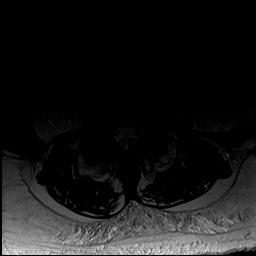
[im 13/41]
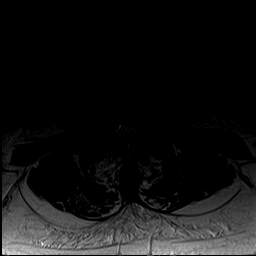
[im 17/41]
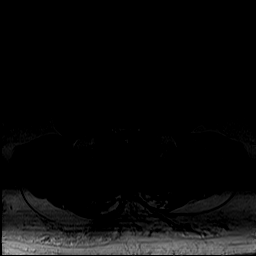
[im 21/41]
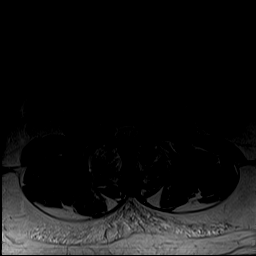
[im 25/41]
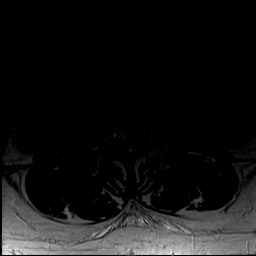
[im 29/41]
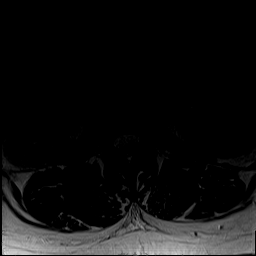
[im 33/41]
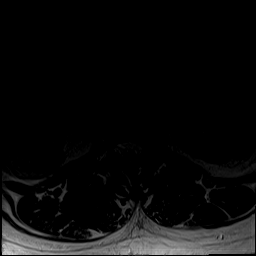
[im 37/41]
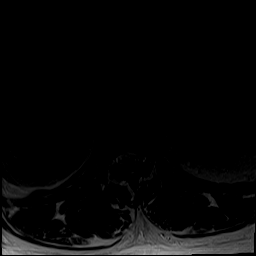
[im 41/41]
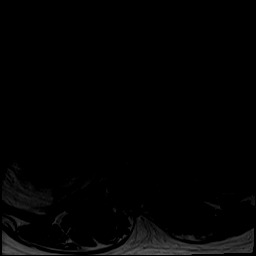

[Series 7: T1 · axial · 4.0mm · 0.39mm/px · z∈[-40,+167]mm · 10 of 41 slices shown (2 of 2)]
[im 1/41]
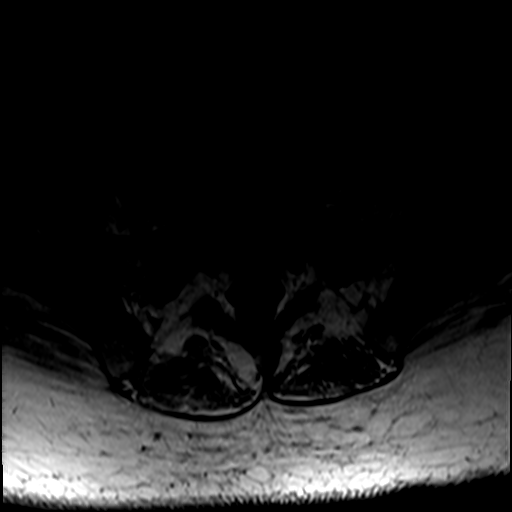
[im 5/41]
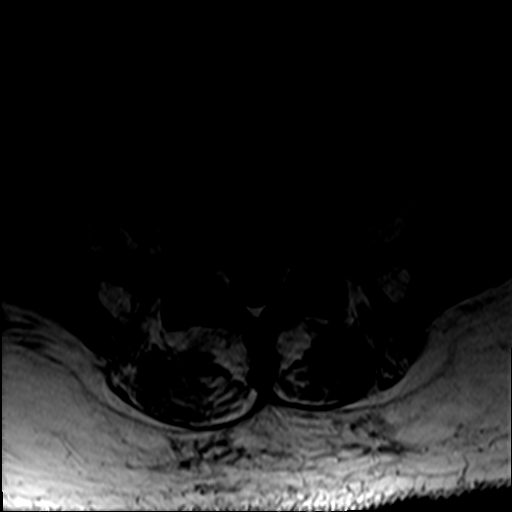
[im 9/41]
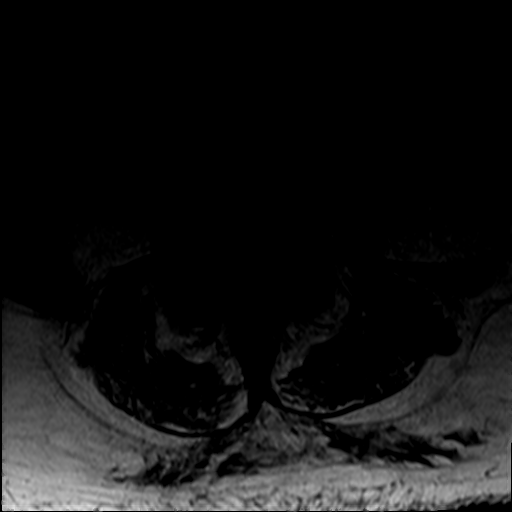
[im 13/41]
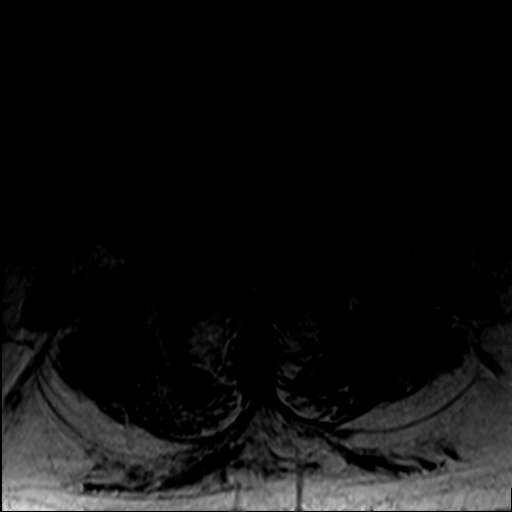
[im 17/41]
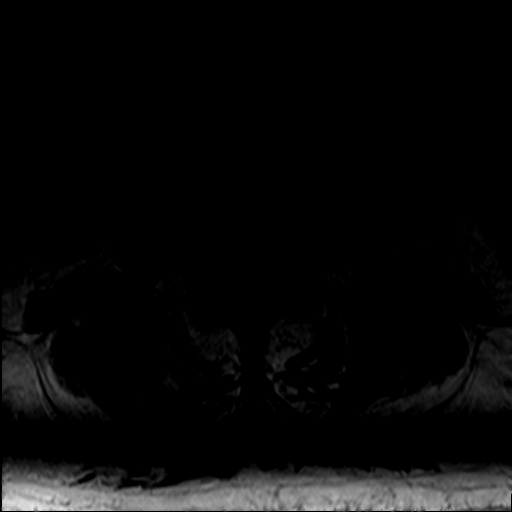
[im 21/41]
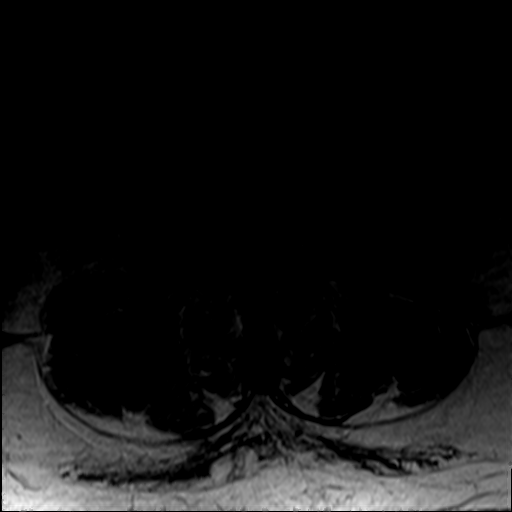
[im 25/41]
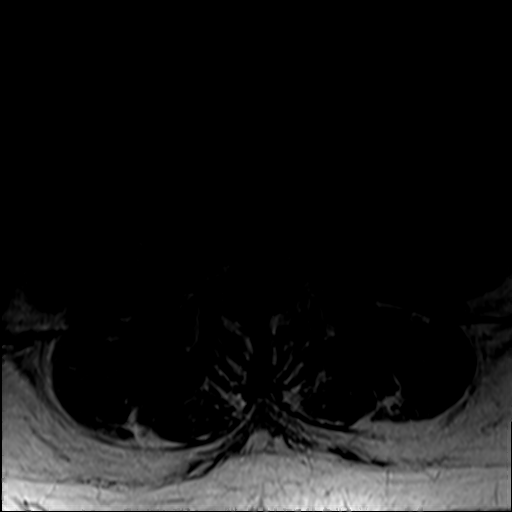
[im 29/41]
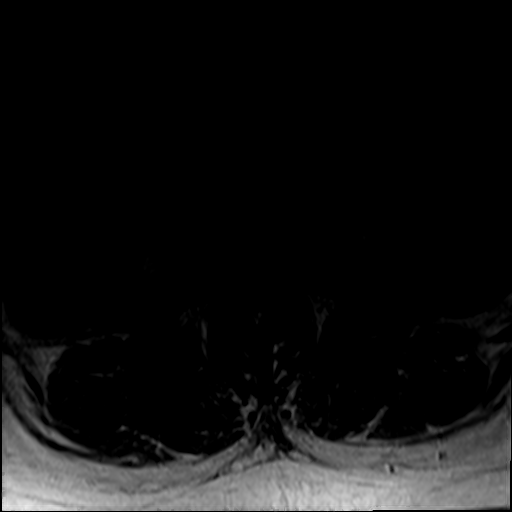
[im 33/41]
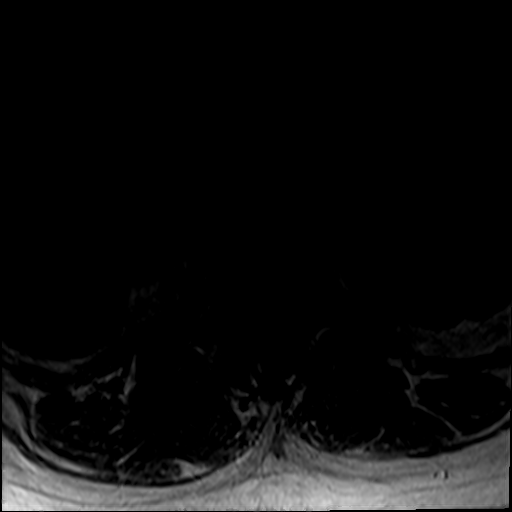
[im 37/41]
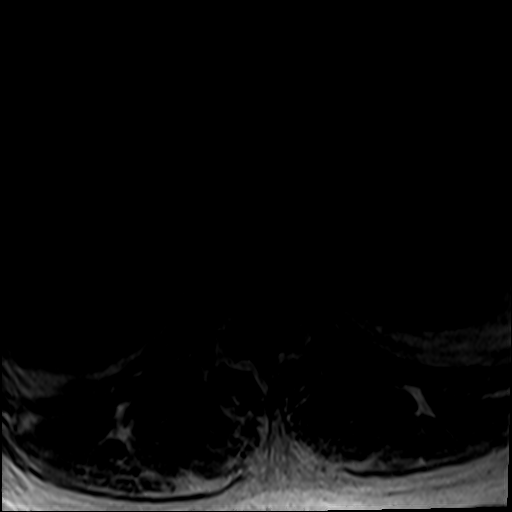

[31 of 48 positions shown; findings below may reference images not displayed]

FINDINGS: Segmentation:  Standard.

Alignment: Normal lumbar lordosis. Grade 1 L4-5 degenerative
anterolisthesis.

Vertebrae: There is mild degenerative endplate edema at the superior
L5 and opposing L5-S1 endplates. There is articulation of spinous
processes with pseudoarthrosis and edema at multiple levels greatest
at L5-S1 compatible with Baastrup's disease (series 5, image 9).
There are degenerative facet effusions from the L3 through S1
levels. Suspected laterally directed synovial cyst of the right L4-5
facet measuring 8 mm (series 5 image 4) into paraspinal muscles.

No evidence for fracture, diskitis, or suspicious osseous lesion.

Conus medullaris: Extends to the L1-2 level and appears normal.

Paraspinal and other soft tissues: Negative.

Disc levels:

L1-2: No significant disc displacement, foraminal narrowing, or
canal stenosis.

L2-3: Small disc bulge eccentric to the right foraminal zone and
right subarticular annular fissure. Mild right foraminal and lateral
recess narrowing. No significant canal stenosis.

L3-4: Moderate disc bulge eccentric to the right and ligamentum
flavum/ facet hypertrophy. Moderate right and mild left foraminal
narrowing, bilateral recess narrowing with contact upon descending
L4 nerve roots, and moderate canal stenosis.

L4-5: Anterolisthesis with uncovered disc, small disc bulge, and
left-greater-than-right moderate facet and ligamentum flavum
hypertrophy. There is moderate left and mild-to-moderate right
foraminal and lateral recess narrowing. Contact upon left descending
L5 nerve root in the lateral recess. Mild canal stenosis.

L5-S1: Moderate disc bulge and posterior marginal osteophytes with
bilateral facet and ligamentum flavum hypertrophy greater on the
left. Moderate right and moderate to severe left foraminal
narrowing. No significant canal stenosis.
IMPRESSION: 1. Multilevel lumbar spondylosis with prominent discogenic and facet
degenerative changes as well as lower lumbar Baastrup's disease.
2. Canal stenosis is greatest at L3-4 where it is moderate and L4-5
where it is mild.
3. Multiple levels of foraminal narrowing moderate at the right
L3-4, left L4-5, right L5-S1 levels and moderate to severe at the
left L5-S1 level.

By: Surekha Bakker M.D.

## 2017-02-17 ENCOUNTER — Emergency Department
Admission: EM | Admit: 2017-02-17 | Discharge: 2017-02-20 | Disposition: A | Discharge status: Skilled Nursing Facility | Payer: Medicaid Other | Attending: Student in an Organized Health Care Education/Training Program | Admitting: Student in an Organized Health Care Education/Training Program

## 2017-02-17 ENCOUNTER — Other Ambulatory Visit: Payer: Self-pay

## 2017-02-17 ENCOUNTER — Encounter: Payer: Self-pay | Admitting: Emergency Medicine

## 2017-02-17 DIAGNOSIS — Z91040 Latex allergy status: Secondary | ICD-10-CM | POA: Insufficient documentation

## 2017-02-17 DIAGNOSIS — Z593 Problems related to living in residential institution: Secondary | ICD-10-CM | POA: Diagnosis not present

## 2017-02-17 DIAGNOSIS — Z590 Homelessness: Secondary | ICD-10-CM | POA: Diagnosis not present

## 2017-02-17 DIAGNOSIS — Z8673 Personal history of transient ischemic attack (TIA), and cerebral infarction without residual deficits: Secondary | ICD-10-CM | POA: Insufficient documentation

## 2017-02-17 DIAGNOSIS — Z794 Long term (current) use of insulin: Secondary | ICD-10-CM | POA: Insufficient documentation

## 2017-02-17 DIAGNOSIS — F17210 Nicotine dependence, cigarettes, uncomplicated: Secondary | ICD-10-CM | POA: Diagnosis not present

## 2017-02-17 DIAGNOSIS — Z79899 Other long term (current) drug therapy: Secondary | ICD-10-CM | POA: Insufficient documentation

## 2017-02-17 DIAGNOSIS — Z046 Encounter for general psychiatric examination, requested by authority: Secondary | ICD-10-CM | POA: Diagnosis present

## 2017-02-17 DIAGNOSIS — F209 Schizophrenia, unspecified: Secondary | ICD-10-CM | POA: Diagnosis not present

## 2017-02-17 DIAGNOSIS — Z9114 Patient's other noncompliance with medication regimen: Secondary | ICD-10-CM | POA: Insufficient documentation

## 2017-02-17 DIAGNOSIS — Z602 Problems related to living alone: Secondary | ICD-10-CM | POA: Insufficient documentation

## 2017-02-17 DIAGNOSIS — R45851 Suicidal ideations: Secondary | ICD-10-CM | POA: Insufficient documentation

## 2017-02-17 DIAGNOSIS — I10 Essential (primary) hypertension: Secondary | ICD-10-CM | POA: Insufficient documentation

## 2017-02-17 DIAGNOSIS — E119 Type 2 diabetes mellitus without complications: Secondary | ICD-10-CM | POA: Insufficient documentation

## 2017-02-17 LAB — COMPREHENSIVE METABOLIC PANEL
ALK PHOS: 76 U/L (ref 38–126)
ALT: 13 U/L — AB (ref 14–54)
AST: 17 U/L (ref 15–41)
Albumin: 3.7 g/dL (ref 3.5–5.0)
Anion gap: 6 (ref 5–15)
BUN: 23 mg/dL — AB (ref 6–20)
CALCIUM: 8.9 mg/dL (ref 8.9–10.3)
CHLORIDE: 108 mmol/L (ref 101–111)
CO2: 27 mmol/L (ref 22–32)
CREATININE: 1.02 mg/dL — AB (ref 0.44–1.00)
Glucose, Bld: 133 mg/dL — ABNORMAL HIGH (ref 65–99)
Potassium: 3.5 mmol/L (ref 3.5–5.1)
Sodium: 141 mmol/L (ref 135–145)
Total Bilirubin: 0.8 mg/dL (ref 0.3–1.2)
Total Protein: 7.8 g/dL (ref 6.5–8.1)

## 2017-02-17 LAB — ACETAMINOPHEN LEVEL: Acetaminophen (Tylenol), Serum: <10 ug/mL — ABNORMAL LOW (ref 10–30)

## 2017-02-17 LAB — CBC
HCT: 37.6 % (ref 35.0–47.0)
HEMOGLOBIN: 12.7 g/dL (ref 12.0–16.0)
MCH: 31.9 pg (ref 26.0–34.0)
MCHC: 33.8 g/dL (ref 32.0–36.0)
MCV: 94.4 fL (ref 80.0–100.0)
PLATELETS: 314 10*3/uL (ref 150–440)
RBC: 3.98 MIL/uL (ref 3.80–5.20)
RDW: 12.7 % (ref 11.5–14.5)
WBC: 6.3 10*3/uL (ref 3.6–11.0)

## 2017-02-17 LAB — ETHANOL

## 2017-02-17 LAB — SALICYLATE LEVEL: Salicylate Lvl: <7 mg/dL (ref 2.8–30.0)

## 2017-02-17 NOTE — ED Notes (Signed)
Pt provided 2 packs of graham crackers and 2 packs of peanut butter as well as water by this tech. Pt has no needs at this time.
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
## 2017-02-17 NOTE — ED Notes (Addendum)
SOC called in Dr Jonelle SidleBreuer briefed  Dwana MelenaMoses Long, cousin that IVC'd, called for heads up regarding SOC call, no answer - start ti voicemail

## 2017-02-17 NOTE — ED Notes (Signed)
Per SOC resind IVC and dc pt to group home, POC discussed with Dr Manson PasseyBrown, consult to social work placed

## 2017-02-17 NOTE — ED Notes (Signed)
TTS with pt att 
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
## 2017-02-17 NOTE — ED Notes (Signed)
This EDT bathed pt and re-dressed. RN notified. Pt was given a warm blanket and is now lying in bed in no distress watching TV. Nothing needed from staff at this time

## 2017-02-18 ENCOUNTER — Other Ambulatory Visit: Payer: Self-pay

## 2017-02-18 LAB — GLUCOSE, CAPILLARY
GLUCOSE-CAPILLARY: 139 mg/dL — AB (ref 65–99)
GLUCOSE-CAPILLARY: 148 mg/dL — AB (ref 65–99)
GLUCOSE-CAPILLARY: 145 mg/dL — AB (ref 65–99)
Glucose-Capillary: 89 mg/dL (ref 65–99)
Glucose-Capillary: 57 mg/dL — ABNORMAL LOW (ref 65–99)
Glucose-Capillary: 78 mg/dL (ref 65–99)
Glucose-Capillary: 88 mg/dL (ref 65–99)
Glucose-Capillary: 131 mg/dL — ABNORMAL HIGH (ref 65–99)

## 2017-02-18 LAB — URINALYSIS, COMPLETE (UACMP) WITH MICROSCOPIC
Bilirubin Urine: NEGATIVE
Glucose, UA: NEGATIVE mg/dL
HGB URINE DIPSTICK: NEGATIVE
KETONES UR: NEGATIVE mg/dL
NITRITE: POSITIVE — AB
PROTEIN: NEGATIVE mg/dL
Specific Gravity, Urine: 1.018 (ref 1.005–1.030)
pH: 6 (ref 5.0–8.0)

## 2017-02-18 LAB — URINE DRUG SCREEN, QUALITATIVE (ARMC ONLY)
Amphetamines, Ur Screen: NOT DETECTED
Barbiturates, Ur Screen: NOT DETECTED
Benzodiazepine, Ur Scrn: NOT DETECTED
CANNABINOID 50 NG, UR ~~LOC~~: NOT DETECTED
COCAINE METABOLITE, UR ~~LOC~~: NOT DETECTED
MDMA (ECSTASY) UR SCREEN: NOT DETECTED
Methadone Scn, Ur: NOT DETECTED
Opiate, Ur Screen: NOT DETECTED
PHENCYCLIDINE (PCP) UR S: NOT DETECTED
Tricyclic, Ur Screen: NOT DETECTED

## 2017-02-18 MED ORDER — INSULIN ASPART 100 UNIT/ML ~~LOC~~ SOLN
0.0000 [IU] | Freq: Three times a day (TID) | SUBCUTANEOUS | Status: DC
  Administered 2017-02-18 – 2017-02-19 (×2): 2 [IU] via SUBCUTANEOUS
  Filled 2017-02-18 (×2): qty 1

## 2017-02-18 MED ORDER — GLYBURIDE 5 MG PO TABS
10.0000 mg | ORAL_TABLET | Freq: Every day | ORAL | Status: DC
Start: 2017-02-19 — End: 2017-02-20
  Administered 2017-02-18 – 2017-02-20 (×3): 10 mg via ORAL
  Filled 2017-02-18 (×3): qty 2

## 2017-02-18 MED ORDER — FOSFOMYCIN TROMETHAMINE 3 G PO PACK
3.0000 g | PACK | Freq: Once | ORAL | Status: AC
  Administered 2017-02-18: 3 g via ORAL
  Filled 2017-02-18: qty 3

## 2017-02-18 MED ORDER — LISINOPRIL 10 MG PO TABS
20.0000 mg | ORAL_TABLET | ORAL | Status: DC
  Administered 2017-02-18 – 2017-02-20 (×3): 20 mg via ORAL
  Filled 2017-02-18 (×3): qty 2

## 2017-02-18 MED ORDER — AMLODIPINE BESYLATE 5 MG PO TABS
5.0000 mg | ORAL_TABLET | Freq: Every day | ORAL | Status: DC
  Administered 2017-02-18 – 2017-02-20 (×3): 5 mg via ORAL
  Filled 2017-02-18 (×3): qty 1

## 2017-02-18 NOTE — ED Provider Notes (Signed)
-----------------------------------------   8:32 AM on 02/18/2017 -----------------------------------------   Blood pressure (!) 151/99, pulse 71, temperature 98.1 F (36.7 C), temperature source Oral, resp. rate 18, height 5\' 8"  (1.727 m), weight 81.6 kg (180 lb), SpO2 100 %.  The patient had no acute events since last update.  Calm and cooperative at this time.  Psychiatry has seen and cleared the patient from a psychiatric standpoint.  Social worker is currently working with the patient for placement.  IVC has been rescinded.   Minna AntisPaduchowski, Rusell Meneely, MD 02/18/17 [phone]

## 2017-02-18 NOTE — ED Notes (Signed)
Pt awake, assisted up to commode by staff for bowel movement. Pt was incontinent of bowel movement in brief as well. Clothing changed, brief changed after pt cleansed of incontinence. Pt assisted back to bed with walker by staff.

## 2017-02-18 NOTE — ED Notes (Signed)
Talked to social worker Caludine about concerns about pts ability to take care of herself and possibility of placement at assisted living. Md also notified and will order pt consult.

## 2017-02-18 NOTE — ED Notes (Signed)
cbg 88 

## 2017-02-18 NOTE — ED Notes (Signed)
Pt incontinent of urine. Cleaned pt up, new brief applied. Sheets changed. Warm blankets given to pt.

## 2017-02-18 NOTE — ED Notes (Signed)
Pt cbg is 57. Pt given juice and has meal tray. Will recheck and continue to monitor pts sugar. MD notified.

## 2017-02-18 NOTE — ED Notes (Signed)
Report received from felicia, rn.  

## 2017-02-18 NOTE — ED Notes (Signed)
Pt cried out d/t having leg stuck between "rail" and mattress, TV and lights off, pt tucked in with blankets  Reports uses a walker to get around
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
## 2017-02-18 NOTE — ED Notes (Signed)
[name] and this RN ambulated pt to toilet with walker, pt cleaned and pants and diaper changed  Linen changed

## 2017-02-18 NOTE — ED Notes (Signed)
Pt eating meal tray 

## 2017-02-18 NOTE — ED Notes (Signed)
Hs snack provided with additional po fluids. Pt denies further needs.

## 2017-02-18 NOTE — ED Notes (Signed)
Pt reports needs to use bathroom. Bed side commode brought to pts bedside. Assisted pt to commode. Pts brief was wet. Changed brief, cleaned pt up and new clothes.

## 2017-02-18 NOTE — NC FL2 (Signed)
  [place] LEVEL OF CARE SCREENING TOOL     IDENTIFICATION  Patient Name: [name] [name] Birthdate: 12-Dec-1965 Sex: female Admission Date (Current Location): 02/17/2017  [facility] and [place] Number:  Engineering geologist and Address:  [facility], [address] [place], [place] [address]      Provider Number: [id number]  Attending Physician Name and Address:  No att. providers found  Relative Name and Phone Number:       Current Level of Care: Hospital Recommended Level of Care: [place] Prior Approval Number:    Date Approved/Denied:   PASRR Number:    Discharge Plan: Domiciliary (Rest home)    Current Diagnoses: Patient Active Problem List   Diagnosis Date Noted  . Urinary incontinence 01/25/2016  . Fecal incontinence 01/25/2016  . Dysthymia 01/25/2016    Orientation RESPIRATION BLADDER Height & Weight     Self, Time, Place  Normal Incontinent Weight: 180 lb (81.6 kg) Height:  _0  (172.7 cm)  BEHAVIORAL SYMPTOMS/MOOD NEUROLOGICAL BOWEL NUTRITION STATUS      Continent Diet(Diabetic)  AMBULATORY STATUS COMMUNICATION OF NEEDS Skin   Supervision Verbally Normal                       Personal Care Assistance Level of Assistance  Bathing, Feeding, Dressing, Total care Bathing Assistance: Limited assistance Feeding assistance: Limited assistance Dressing Assistance: Limited assistance Total Care Assistance: Limited assistance   Functional Limitations Info  Sight, Hearing, Speech Sight Info: Adequate Hearing Info: Adequate Speech Info: Adequate(Marbled)    SPECIAL CARE FACTORS FREQUENCY                       Contractures Contractures Info: Not present    Additional Factors Info  Allergies   Allergies Info: Latex           Current Medications (02/18/2017):  This is the current hospital active medication list No current facility-administered medications for this encounter.     Current Outpatient Medications  Medication Sig Dispense Refill  . amLODipine (NORVASC) 5 MG tablet Take 1 tablet (5 mg total) by mouth daily. 30 tablet 1  . blood glucose meter kit and supplies KIT Dispense based on patient and insurance preference. Use up to four times daily as directed. (FOR ICD-9 250.00, 250.01). 1 each 0  . glyBURIDE (DIABETA) 5 MG tablet Take 2 tablets (10 mg total) by mouth daily with breakfast. 60 tablet 1  . insulin aspart (NOVOLOG) 100 UNIT/ML injection Please check blood sugar twice daily (morning and afternoon) Please administer insulin subcutaneously as follows for blood glucose reading: BG 200-250: 2 units SQ BG 251-300: 4 units SQ BG 301-350: 6 units SQ BG >350: 8 units SQ 10 mL 3  . insulin glargine (LANTUS) 100 UNIT/ML injection Inject 0.65 mLs (65 Units total) into the skin at bedtime. (Patient taking differently: Inject 50 Units into the skin at bedtime. ) 20 mL 0  . lisinopril (PRINIVIL,ZESTRIL) 20 MG tablet Take 1 tablet (20 mg total) by mouth every morning. 30 tablet 1     Discharge Medications: Please see discharge summary for a list of discharge medications.  Relevant Imaging Results:  Relevant Lab Results:   Additional Information SSN: [id number]  [name], [place]

## 2017-02-18 NOTE — Clinical Social Work Note (Addendum)
Clinical Social Work Assessment  Patient Details  Name: [name] HohJennifer R [name] MRN: [id number] Date of Birth: 1965-05-20  Date of referral:  02/18/17               Reason for consult:  Housing Concerns/Homelessness                Permission sought to share information with:  Family Supports Permission granted to share information::  Yes, Verbal Permission Granted  Name::      Agency::     Relationship::     Contact Information:     Housing/Transportation Living arrangements for the past 2 months:  Single Family Home Source of Information:  Patient Patient Interpreter Needed:  None Criminal Activity/Legal Involvement Pertinent to Current Situation/Hospitalization:  No - Comment as needed Significant Relationships:  Other(Comment)(Aunt) Lives with:   no one Do you feel safe going back to the place where you live?   no Need for family participation in patient care:   no  Care giving concerns: Unable to reach family   Social Worker assessment / plan: LCSW introduced myself to patient and she was not able to respond in appropriate sentences.[name] [name] is an 51 y.o. female. Pt presents to the ED under IVC no rescinded taken out by pt's cousin. ICV states that pt is currently noncompliant with her medication for schizophrenia and is expressing SI. Pt is currently living in the home with the cousin but LCSW unable to reach any family members Pt denies having any SI and/or HI. When asked pt denied any A/V hallucinations or delusions.   She reports she was with her cousin and now wants to live in a group home. Called her previous group home and they reported she is diabetic,  Uses a walker, she is incontinent and needs bathing and dressing. She is able to feed herself. Patient was taken from last group home and went with family and dropped off at ED LCSW unable to reach any family members. Called  Earl LitesGregory "BJ" Ermalinda MemosBradshaw (son [phone]) and left a detailed message. Called [phone]  Maryjean KaDavid Long patients cousin unable to leave message voice mail box full.       Employment status:  Disabled (Comment on whether or not currently receiving Disability)(SMI) Insurance information:  [place] PT Recommendations:  Not assessed at this time Information / Referral to community resources:     Patient/Family's Response to care:  TBD  Patient/Family's Understanding of and Emotional Response to Diagnosis, Current Treatment, and Prognosis:  TBD  Emotional Assessment Appearance:   looks her age Attitude/Demeanor/Rapport:  Calm Affect (typically observed): Polite Orientation:  Oriented to Self, Oriented to Place Alcohol / Substance use: None   Psych involvement (Current and /or in the community):  Yes (Comment)(TBD)  Discharge Needs  Concerns to be addressed:   Homeless Readmission within the last 30 days:  No Current discharge risk:  Cognitively Impaired, Psychiatric Illness Barriers to Discharge:      Cheron SchaumannBandi, Adell Panek M, LCSW 02/18/2017, 9:04 AM

## 2017-02-18 NOTE — ED Notes (Addendum)
Cbg recheck 78. Will continue to monitor.

## 2017-02-18 NOTE — ED Notes (Signed)
Pt sitting up in bed, denies need for urination or other toileting. Pt has po fluids at bedside. Pt is alert to self, place, situation, but is unsure of month, but is aware of day of week, year and recent holiday. Pt with unlabored resps, skin normal color warm and dry. Vss. Pt denies pain. Moist oral mucus membranes present.

## 2017-02-19 LAB — GLUCOSE, CAPILLARY
GLUCOSE-CAPILLARY: 122 mg/dL — AB (ref 65–99)
Glucose-Capillary: 105 mg/dL — ABNORMAL HIGH (ref 65–99)
Glucose-Capillary: 83 mg/dL (ref 65–99)

## 2017-02-19 LAB — PREGNANCY, URINE: Preg Test, Ur: NEGATIVE

## 2017-02-19 MED ORDER — LOPERAMIDE HCL 2 MG PO CAPS
2.0000 mg | ORAL_CAPSULE | Freq: Once | ORAL | Status: AC
  Administered 2017-02-19: 2 mg via ORAL
  Filled 2017-02-19: qty 1

## 2017-02-19 NOTE — Progress Notes (Signed)
Clinical Child psychotherapistocial Worker (CSW) received a call from Darl PikesSusan [phone] group home owner for [facility] stating that she has reviewed group home referral and will come assess patient today.   [employer]Bailey Daiel Strohecker, LCSW [phone]

## 2017-02-19 NOTE — ED Notes (Signed)
Pt sitting on the side of the bed eating breakfast.

## 2017-02-19 NOTE — ED Notes (Signed)
md notified of edema. Pt has not had any resp distress during shift nor complaints of shob.

## 2017-02-19 NOTE — ED Notes (Signed)
Pt awoken for vital signs. Pt denies need to toilet, brief is currently dry. Pt readjusted in bed for comfort. Pt has 2+ edema noted to bilateral feet.

## 2017-02-19 NOTE — ED Provider Notes (Signed)
-----------------------------------------   7:20 AM on 02/19/2017 -----------------------------------------   Blood pressure (!) 155/94, pulse 71, temperature (!) 97.4 F (36.3 C), temperature source Oral, resp. rate 16, height 5\' 8"  (1.727 m), weight 81.6 kg (180 lb), SpO2 100 %.  The patient had no acute events since last update.  Calm and cooperative at this time.  Disposition is pending Psychiatry/Behavioral Medicine team recommendations.     Willy Eddyobinson, Parks Czajkowski, MD 02/19/17 [phone]

## 2017-02-19 NOTE — ED Notes (Signed)
2 assist using wheelchair to bathroom for shower.  Pt seated on bedside commode to shower. Pt had incontinent stool and urine in diaper.

## 2017-02-19 NOTE — Evaluation (Signed)
Physical Therapy Evaluation Patient Details Name: [name] [name] MRN: [id number] DOB: 1966-01-28 Today's Date: 02/19/2017   History of Present Illness  Pt admitted for expressing SI and being noncomplaint with meds. IVC now rescended and pt reports she hasn't taken meds because her family hasn't filled Rx. Pt currently living with cousin, however wishes to return to group home.  Clinical Impression  Pt is a pleasant 51 year old female who was admitted for IVC for SI. Pt performs bed mobility independently, transfers with min assist, and ambulation with cga and SW. Pt demonstrates deficits with endurance/pain/strength. Does need assist to stand from low surfaces. Pt is mainly limited by B knee pain, however does not need physical assist to ambulate. Endurance is limited and only short distances able to navigate with SW. As pain improves, expect mobility to improve. Pt reports she has most pain in mornings and has been having pain x 2 years. She is unable to fully stand upright and has slight B knee contractures. Would benefit from skilled PT to address above deficits and promote optimal return to PLOF. Recommend transition to HHPT upon discharge from acute hospitalization. Recommend going to group home vs ALF with continued therapy in home setting.       Follow Up Recommendations Home health PT    Equipment Recommendations  None recommended by PT    Recommendations for Other Services       Precautions / Restrictions Precautions Precautions: Fall Restrictions Weight Bearing Restrictions: No      Mobility  Bed Mobility Overal bed mobility: Independent             General bed mobility comments: safe technique performed  Transfers Overall transfer level: Needs assistance Equipment used: Standard walker Transfers: Sit to/from Stand Sit to Stand: Min assist         General transfer comment: from very low surface. Bed controls unable to be located to raise up bed. Once  standing, flexed posture observed. Pt unable to fully extend trunk or B knees.  Ambulation/Gait Ambulation/Gait assistance: Min guard Ambulation Distance (Feet): 20 Feet Assistive device: Standard walker Gait Pattern/deviations: Step-to pattern;Antalgic     General Gait Details: ambulated around room, however due to B knee pain, distance limited. Safe technique performed and correct use of RW noted.  Stairs            Wheelchair Mobility    Modified Rankin (Stroke Patients Only)       Balance Overall balance assessment: Needs assistance Sitting-balance support: Feet supported Sitting balance-Leahy Scale: Good     Standing balance support: Bilateral upper extremity supported Standing balance-Leahy Scale: Fair                               Pertinent Vitals/Pain Pain Assessment: Faces Faces Pain Scale: Hurts little more Pain Location: B knees; R more than L Pain Descriptors / Indicators: Aching Pain Intervention(s): Limited activity within patient's tolerance    Home Living Family/patient expects to be discharged to:: Group home Living Arrangements: (cousin)               Additional Comments: Pt reports she ambulates with RW at baseline    Prior Function Level of Independence: Independent with assistive device(s)         Comments: ambulated with RW at baseline; short distances due to B knee pain.     Hand Dominance        Extremity/Trunk Assessment  Upper Extremity Assessment Upper Extremity Assessment: Overall WFL for tasks assessed    Lower Extremity Assessment Lower Extremity Assessment: Generalized weakness(B LE grossly 3+/5)       Communication   Communication: No difficulties  Cognition Arousal/Alertness: Awake/alert Behavior During Therapy: WFL for tasks assessed/performed Overall Cognitive Status: Within Functional Limits for tasks assessed                                        General Comments       Exercises Other Exercises Other Exercises: Seated ther-ex performed on B LE x 10 reps including ankle pumps, alt. marching, and hip abd/add. ALl ther-ex performed with supervision and cues for frequency and duration   Assessment/Plan    PT Assessment Patient needs continued PT services  PT Problem List Decreased strength;Decreased activity tolerance;Decreased balance;Pain       PT Treatment Interventions Gait training;Therapeutic exercise    PT Goals (Current goals can be found in the Care Plan section)  Acute Rehab PT Goals Patient Stated Goal: to have less pain PT Goal Formulation: With patient Time For Goal Achievement: 03/05/17 Potential to Achieve Goals: Good    Frequency Min 2X/week   Barriers to discharge        Co-evaluation               AM-PAC PT "6 Clicks" Daily Activity  Outcome Measure Difficulty turning over in bed (including adjusting bedclothes, sheets and blankets)?: None Difficulty moving from lying on back to sitting on the side of the bed? : None Difficulty sitting down on and standing up from a chair with arms (e.g., wheelchair, bedside commode, etc,.)?: Unable Help needed moving to and from a bed to chair (including a wheelchair)?: A Little Help needed walking in hospital room?: A Little Help needed climbing 3-5 steps with a railing? : A Little 6 Click Score: 18    End of Session Equipment Utilized During Treatment: Gait belt Activity Tolerance: Patient tolerated treatment well Patient left: in bed Nurse Communication: Mobility status PT Visit Diagnosis: Muscle weakness (generalized) (M62.81);Pain Pain - Right/Left: Right Pain - part of body: Knee    Time: 0925-0942 PT Time Calculation (min) (ACUTE ONLY): 17 min   Charges:   PT Evaluation $PT Eval Low Complexity: 1 Low PT Treatments $Therapeutic Exercise: 8-22 mins   PT G Codes:   PT G-Codes **NOT FOR INPATIENT CLASS** Functional Assessment Tool Used: AM-PAC 6 Clicks Basic  Mobility Functional Limitation: Mobility: Walking and moving around Mobility: Walking and Moving Around Current Status ([id number](G8978): At least 40 percent but less than 60 percent impaired, limited or restricted Mobility: Walking and Moving Around Goal Status [phone]([id number]): At least 20 percent but less than 40 percent impaired, limited or restricted    Elizabeth PalauStephanie Naijah Lacek, PT, DPT [phone]   Charbel Los 02/19/2017, 11:26 AM

## 2017-02-19 NOTE — ED Notes (Signed)
Physical therapy in room to evaluate patient mobility

## 2017-02-19 NOTE — ED Notes (Addendum)
Brooks SailorsSusan Stewrt [phone]) in ED to evaluate pt. She verbalized to this RN that [facility] will accept pt contingent on ED providing :       FL2       (-) TB test - either a new test or the old results from previous Group Home       ([facility] [place]       [place]  Also requesting notes on pt which this RN does not have.  No social work on call at this time. RN updated Darl PikesSusan that a note would be filled and social work would follow up as soon as possible.

## 2017-02-19 NOTE — ED Notes (Signed)
Pt had 3 bouts of diarrhea since the start of the 7am shift. Informed Dr. Roxan Hockeyobinson, new order for imodium given.

## 2017-02-19 NOTE — ED Notes (Signed)
Pt has phone call from family and is currently talking to family. Pt in NAD and does not appear to be upset when talking to family.

## 2017-02-19 NOTE — ED Notes (Signed)
PT  VOL/  PENDING  PLACEMENT 

## 2017-02-19 NOTE — ED Notes (Signed)
Report to ashley, rn

## 2017-02-20 ENCOUNTER — Emergency Department: Payer: Medicaid Other

## 2017-02-20 LAB — GLUCOSE, CAPILLARY: GLUCOSE-CAPILLARY: 148 mg/dL — AB (ref 65–99)

## 2017-02-20 MED ORDER — ACETAMINOPHEN 325 MG PO TABS
650.0000 mg | ORAL_TABLET | Freq: Once | ORAL | Status: AC
  Administered 2017-02-20: 650 mg via ORAL
  Filled 2017-02-20: qty 2

## 2017-02-20 NOTE — Progress Notes (Signed)
Per MD patient's chest X-ray is negative so [facility]. Per Suezanne JacquetSusan [facility] she will come pick patient up today at 4:30 pm. Clinical Social Worker (CSW) prepared D/C packet and put in on chart. RN and MD aware of above. CSW attempted to contact patient's aunt Orma FlamingRuth Long however it was not a working number.   [employer] Hughes IncorporatedBailey Brita Jurgensen, LCSW [phone]

## 2017-02-20 NOTE — ED Notes (Signed)
Patient observed lying in bed with eyes closed  Even, unlabored respirations observed   NAD pt appears to be sleeping  I will continue to monitor along with every 15 minute visual observations and ongoing security monitoring    

## 2017-02-20 NOTE — ED Notes (Signed)
Pt spilled her drink on bed, [name] [name], ED Tech, changed pt's linen, scrubs, pt denies any other need at present, provided pt with some warm blankets

## 2017-02-20 NOTE — ED Notes (Signed)
Pt reports she is having arthritis pain reports she usually takes Tylenol at home, Dr. Manson PasseyBrown made aware received orders to administer 650 mg of Tylenol PO Rn acknowledged orders

## 2017-02-20 NOTE — Progress Notes (Signed)
Per Suezanne JacquetSusan [facility] she can accept patient today if chest x-ray is negative for TB. MD and RN aware of above and chest x-ray has been ordered. CSW submitted for family care home PASARR in the RSVP system and got reference number [id number]. FL2 complete, [facility] and [facility].   [employer]orporatedBailey Jeryl Umholtz, LCSW [phone]

## 2017-02-20 NOTE — ED Notes (Signed)
Pt placed on bedside commode, given bed bath and linen changed.

## 2017-02-20 NOTE — ED Notes (Signed)
BEHAVIORAL HEALTH ROUNDING Patient sleeping: Yes.   Patient alert and oriented: eyes closed  Appears to be asleep Behavior appropriate: Yes.  ; If no, describe:  Nutrition and fluids offered: Yes  Toileting and hygiene offered: sleeping Sitter present: q 15 minute observations and security monitoring Law enforcement present: yes  ODS 

## 2017-02-20 NOTE — ED Notes (Signed)
ED  Is the patient under IVC or is there intent for IVC: NO   Is the patient medically cleared: Yes.   Is there vacancy in the ED BHU: Yes.   Is the population mix appropriate for patient: Yes.   Is the patient awaiting placement in inpatient or outpatient setting:  Placement  Has the patient had a psychiatric consult: Yes.   Survey of unit performed for contraband, proper placement and condition of furniture, tampering with fixtures in bathroom, shower, and each patient room: Yes.  ; Findings:  APPEARANCE/BEHAVIOR Calm and cooperative NEURO ASSESSMENT Orientation: oriented to self and place  Denies pain Hallucinations: No.None noted (Hallucinations) denies  Speech: Normal Gait:  Walker at bedside  unsteady gait - one person assistance  RESPIRATORY ASSESSMENT Even  Unlabored respirations  CARDIOVASCULAR ASSESSMENT Pulses equal   regular rate  Skin warm and dry   GASTROINTESTINAL ASSESSMENT no GI complaint EXTREMITIES Full ROM  PLAN OF CARE Provide calm/safe environment. Vital signs assessed twice daily. ED BHU Assessment once each 12-hour shift. Collaborate with TTS daily or as condition indicates. Assure the ED provider has rounded once each shift. Provide and encourage hygiene. Provide redirection as needed. Assess for escalating behavior; address immediately and inform ED provider.  Assess family dynamic and appropriateness for visitation as needed: Yes.  ; If necessary, describe findings:  Educate the patient/family about BHU procedures/visitation: Yes.  ; If necessary, describe findings:

## 2017-02-20 NOTE — ED Notes (Signed)
Pt is sleeping, pt has feet on floor and upper body on bed no distress noted

## 2017-02-20 NOTE — NC FL2 (Signed)
[place] LEVEL OF CARE SCREENING TOOL     IDENTIFICATION  Patient Name: [name] [name] Birthdate: 05/28/1965 Sex: female Admission Date (Current Location): 02/17/2017  [facility] and [place] Number:  Engineering geologist and Address:  [facility], [address], [place], [place] [address]      Provider Number: [id number]  Attending Physician Name and Address:  No att. providers found  Relative Name and Phone Number:       Current Level of Care: Hospital Recommended Level of Care: [place] Prior Approval Number:    Date Approved/Denied:   PASRR Number:  [id number]   Discharge Plan: Domiciliary (Rest home)    Current Diagnoses: Patient Active Problem List   Diagnosis Date Noted  . Urinary incontinence 01/25/2016  . Fecal incontinence 01/25/2016  . Dysthymia 01/25/2016   schizophrenia     Orientation RESPIRATION BLADDER Height & Weight     Self, Time, Place  Normal Incontinent Weight: 180 lb (81.6 kg) Height:  _0  (172.7 cm)  BEHAVIORAL SYMPTOMS/MOOD NEUROLOGICAL BOWEL NUTRITION STATUS      Continent Diet(Diabetic)  AMBULATORY STATUS COMMUNICATION OF NEEDS Skin   Supervision Verbally Normal                       Personal Care Assistance Level of Assistance  Bathing, Feeding, Dressing, Total care Bathing Assistance: Limited assistance Feeding assistance: Limited assistance Dressing Assistance: Limited assistance Total Care Assistance: Limited assistance   Functional Limitations Info  Sight, Hearing, Speech Sight Info: Adequate Hearing Info: Adequate Speech Info: Adequate(Marbled)    SPECIAL CARE FACTORS FREQUENCY                       Contractures Contractures Info: Not present    Additional Factors Info  Allergies   Allergies Info: Latex           Current Medications (02/20/2017):  This is the current hospital active medication list Current Facility-Administered Medications   Medication Dose Route Frequency Provider Last Rate Last Dose  . amLODipine (NORVASC) tablet 5 mg  5 mg Oral Daily Harvest Dark, MD   5 mg at 02/19/17 1037  . glyBURIDE (DIABETA) tablet 10 mg  10 mg Oral Q breakfast Harvest Dark, MD   10 mg at 02/19/17 1037  . insulin aspart (novoLOG) injection 0-15 Units  0-15 Units Subcutaneous TID WC Harvest Dark, MD   2 Units at 02/19/17 1850  . lisinopril (PRINIVIL,ZESTRIL) tablet 20 mg  20 mg Oral Merrily Pew, MD   20 mg at 02/19/17 1037   Current Outpatient Medications  Medication Sig Dispense Refill  . amLODipine (NORVASC) 5 MG tablet Take 1 tablet (5 mg total) by mouth daily. (Patient not taking: Reported on 02/19/2017) 30 tablet 1  . blood glucose meter kit and supplies KIT Dispense based on patient and insurance preference. Use up to four times daily as directed. (FOR ICD-9 250.00, 250.01). 1 each 0  . glyBURIDE (DIABETA) 5 MG tablet Take 2 tablets (10 mg total) by mouth daily with breakfast. (Patient not taking: Reported on 02/19/2017) 60 tablet 1  . insulin aspart (NOVOLOG) 100 UNIT/ML injection Please check blood sugar twice daily (morning and afternoon) Please administer insulin subcutaneously as follows for blood glucose reading: BG 200-250: 2 units SQ BG 251-300: 4 units SQ BG 301-350: 6 units SQ BG >350: 8 units SQ (Patient not taking: Reported on 02/19/2017) 10 mL 3  . insulin  glargine (LANTUS) 100 UNIT/ML injection Inject 0.65 mLs (65 Units total) into the skin at bedtime. (Patient not taking: Reported on 02/19/2017) 20 mL 0  . lisinopril (PRINIVIL,ZESTRIL) 20 MG tablet Take 1 tablet (20 mg total) by mouth every morning. (Patient not taking: Reported on 02/19/2017) 30 tablet 1     Discharge Medications: Please see discharge summary for a list of discharge medications.  Relevant Imaging Results:  Relevant Lab Results:   Additional Information SSN: [id number]  Dennard Vezina, Veronia Beets, LCSW

## 2017-02-20 NOTE — ED Notes (Addendum)
Identification verified from Tamala BariSusan Stewart from [facility].

## 2017-02-20 NOTE — ED Notes (Signed)
Pt to xray with radiology staff and the [employer] [facility]acey  [facility]

## 2017-02-20 NOTE — ED Notes (Signed)
BEHAVIORAL HEALTH ROUNDING Patient sleeping: No. Patient alert and oriented: yes Behavior appropriate: Yes.  ; If no, describe:  Nutrition and fluids offered: yes Toileting and hygiene offered: Yes  Sitter present: q15 minute observations and security  monitoring Law enforcement present: Yes  ODS  

## 2017-02-20 NOTE — ED Provider Notes (Addendum)
-----------------------------------------   8:28 AM on 02/20/2017 -----------------------------------------   Blood pressure (!) 156/108, pulse 80, temperature 98.4 F (36.9 C), temperature source Oral, resp. rate 18, height 5\' 8"  (1.727 m), weight 81.6 kg (180 lb), SpO2 100 %.  The patient had no acute events since last update.  Calm and cooperative at this time.  Disposition is pending Psychiatry/Behavioral Medicine team recommendations.  ----------------------------------------- 2:59 PM on 02/20/2017 -----------------------------------------  No evidence of tb on x ray and she is not complaining of tb   Jeanmarie PlantMcShane, James A, MD 02/20/17 [id number]    Jeanmarie PlantMcShane, James A, MD 02/20/17 1459

## 2017-02-20 NOTE — ED Notes (Signed)
Pt given meal tray.

## 2017-02-20 NOTE — ED Notes (Signed)
She is sleeping soundly - CBG not performed   BEHAVIORAL HEALTH ROUNDING Patient sleeping: Yes.   Patient alert and oriented: eyes closed  Appears to be asleep Behavior appropriate: Yes.  ; If no, describe:  Nutrition and fluids offered: Yes  Toileting and hygiene offered: sleeping Sitter present: q 15 minute observations and security monitoring Law enforcement present: yes  ODS  ENVIRONMENTAL ASSESSMENT Potentially harmful objects out of patient reach: Yes.   Personal belongings secured: Yes.   Patient dressed in hospital provided attire only: Yes.   Plastic bags out of patient reach: Yes.   Patient care equipment (cords, cables, call bells, lines, and drains) shortened, removed, or accounted for: Yes.   Equipment and supplies removed from bottom of stretcher: Yes.   Potentially toxic materials out of patient reach: Yes.   Sharps container removed or out of patient reach: Yes.

## 2017-03-05 ENCOUNTER — Other Ambulatory Visit: Payer: Self-pay

## 2017-03-05 ENCOUNTER — Emergency Department
Admission: EM | Admit: 2017-03-05 | Discharge: 2017-03-05 | Disposition: A | Discharge status: Home / Self Care | Payer: Medicaid Other | Attending: Emergency Medicine | Admitting: Emergency Medicine

## 2017-03-05 DIAGNOSIS — Z79899 Other long term (current) drug therapy: Secondary | ICD-10-CM | POA: Diagnosis not present

## 2017-03-05 DIAGNOSIS — I10 Essential (primary) hypertension: Secondary | ICD-10-CM | POA: Diagnosis not present

## 2017-03-05 DIAGNOSIS — Z794 Long term (current) use of insulin: Secondary | ICD-10-CM | POA: Diagnosis not present

## 2017-03-05 DIAGNOSIS — N390 Urinary tract infection, site not specified: Secondary | ICD-10-CM | POA: Insufficient documentation

## 2017-03-05 DIAGNOSIS — E119 Type 2 diabetes mellitus without complications: Secondary | ICD-10-CM | POA: Diagnosis not present

## 2017-03-05 DIAGNOSIS — R82998 Other abnormal findings in urine: Secondary | ICD-10-CM | POA: Diagnosis present

## 2017-03-05 DIAGNOSIS — F17210 Nicotine dependence, cigarettes, uncomplicated: Secondary | ICD-10-CM | POA: Diagnosis not present

## 2017-03-05 LAB — URINALYSIS, COMPLETE (UACMP) WITH MICROSCOPIC
BILIRUBIN URINE: NEGATIVE
Glucose, UA: NEGATIVE mg/dL
Hgb urine dipstick: NEGATIVE
KETONES UR: NEGATIVE mg/dL
LEUKOCYTES UA: NEGATIVE
Nitrite: POSITIVE — AB
PH: 6 (ref 5.0–8.0)
PROTEIN: NEGATIVE mg/dL
Specific Gravity, Urine: 1.005 (ref 1.005–1.030)

## 2017-03-05 MED ORDER — CEPHALEXIN 500 MG PO CAPS
500.0000 mg | ORAL_CAPSULE | Freq: Once | ORAL | Status: AC
  Administered 2017-03-05: 500 mg via ORAL
  Filled 2017-03-05: qty 1

## 2017-03-05 MED ORDER — CEPHALEXIN 500 MG PO CAPS: 500.0000 mg | ORAL_CAPSULE | Freq: Two times a day (BID) | ORAL | 0 refills | Status: AC

## 2017-03-05 NOTE — ED Notes (Signed)
Pt noted in hall sitting on rolling stool, pt very unsteady and rolling down hall stating she was going home - pt, with 2 person assist, placed back in bed - pt given diet sprite and graham crackers and peanut butter - pt alarm placed on pt

## 2017-03-05 NOTE — ED Provider Notes (Signed)
 [facility] Emergency Department Provider Note  ____________________________________________   First MD Initiated Contact with Patient 03/05/17 1204     (approximate)  I have reviewed the triage vital signs and the nursing notes.   HISTORY  Chief Complaint Urinary Tract Infection   HPI [name] [name] is a 52 y.o. female a history of diabetes, hypertension and stroke from an assisted living who is presenting to the emergency department today with foul-smelling urine.  The patient has no complaints.  She is denying any burning with urination, abdominal pain, nausea or vomiting.  She denies any vaginal discharge or bleeding.  Her assisted living sent her here to the emergency department for evaluation of possible UTI but did not report any altered mental status or fever.  EMS reported a glucose level of 156.  Past Medical History:  Diagnosis Date  . Anxiety   . Arthritis   . Diabetes mellitus without complication (HCC)   . Hypertension   . Stroke (HCC) 2004   x 2    Patient Active Problem List   Diagnosis Date Noted  . Urinary incontinence 01/25/2016  . Fecal incontinence 01/25/2016  . Dysthymia 01/25/2016    Past Surgical History:  Procedure Laterality Date  . NO PAST SURGERIES      Prior to Admission medications   Medication Sig Start Date End Date Taking? Authorizing Provider  amLODipine (NORVASC) 5 MG tablet Take 1 tablet (5 mg total) by mouth daily. Patient not taking: Reported on 02/19/2017 02/02/16 02/01/17  Paduchowski, Kevin, MD  blood glucose meter kit and supplies KIT Dispense based on patient and insurance preference. Use up to four times daily as directed. (FOR ICD-9 250.00, 250.01). 02/02/16   Paduchowski, Kevin, MD  glyBURIDE (DIABETA) 5 MG tablet Take 2 tablets (10 mg total) by mouth daily with breakfast. Patient not taking: Reported on 02/19/2017 02/02/16   Paduchowski, Kevin, MD  insulin aspart (NOVOLOG) 100 UNIT/ML injection  Please check blood sugar twice daily (morning and afternoon) Please administer insulin subcutaneously as follows for blood glucose reading: BG 200-250: 2 units SQ BG 251-300: 4 units SQ BG 301-350: 6 units SQ BG >350: 8 units SQ Patient not taking: Reported on 02/19/2017 02/02/16 02/01/17  Paduchowski, Kevin, MD  insulin glargine (LANTUS) 100 UNIT/ML injection Inject 0.65 mLs (65 Units total) into the skin at bedtime. Patient not taking: Reported on 02/19/2017 12/18/15   Stafford, Phillip, MD  lisinopril (PRINIVIL,ZESTRIL) 20 MG tablet Take 1 tablet (20 mg total) by mouth every morning. Patient not taking: Reported on 02/19/2017 02/02/16   Paduchowski, Kevin, MD    Allergies Latex  No family history on file.  Social History Social History   Tobacco Use  . Smoking status: Current Every Day Smoker    Packs/day: 1.00    Types: Cigarettes  . Smokeless tobacco: Never Used  Substance Use Topics  . Alcohol use: No  . Drug use: No    Review of Systems  Constitutional: No fever/chills Eyes: No visual changes. ENT: No sore throat. Cardiovascular: Denies chest pain. Respiratory: Denies shortness of breath. Gastrointestinal: No abdominal pain.  No nausea, no vomiting.  No diarrhea.  No constipation. Genitourinary: Negative for dysuria. Musculoskeletal: Negative for back pain. Skin: Negative for rash. Neurological: Negative for headaches, focal weakness or numbness.   ____________________________________________   PHYSICAL EXAM:  VITAL SIGNS: ED Triage Vitals  Enc Vitals Group     BP 03/05/17 1208 (!) 147/86     Pulse Rate 03/05/17 1208 88       Resp 03/05/17 1208 15     Temp 03/05/17 1208 98.7 F (37.1 C)     Temp Source 03/05/17 1208 Oral     SpO2 03/05/17 1207 99 %     Weight 03/05/17 1208 180 lb (81.6 kg)     Height 03/05/17 1208 5' 7" (1.702 m)     Head Circumference --      Peak Flow --      Pain Score 03/05/17 1207 0     Pain Loc --      Pain Edu? --       Excl. in [place]? --     Constitutional: Alert and oriented. Well appearing and in no acute distress. Eyes: Conjunctivae are normal.  Head: Atraumatic. Nose: No congestion/rhinnorhea. Mouth/Throat: Mucous membranes are moist.  Neck: No stridor.   Cardiovascular: Normal rate, regular rhythm. Grossly normal heart sounds.   Respiratory: Normal respiratory effort.  No retractions. Lungs CTAB. Gastrointestinal: Soft and nontender. No distention.  Musculoskeletal: No lower extremity tenderness nor edema.  No joint effusions. Neurologic:  Normal speech and language. No gross focal neurologic deficits are appreciated. Skin:  Skin is warm, dry and intact. No rash noted. Psychiatric: Mood and affect are normal. Speech and behavior are normal.  ____________________________________________   LABS (all labs ordered are listed, but only abnormal results are displayed)  Labs Reviewed  URINALYSIS, COMPLETE (UACMP) WITH MICROSCOPIC - Abnormal; Notable for the following components:      Result Value   Color, Urine YELLOW (*)    APPearance CLEAR (*)    Nitrite POSITIVE (*)    Bacteria, UA MANY (*)    Squamous Epithelial / LPF 0-5 (*)    All other components within normal limits  URINE CULTURE   ____________________________________________  EKG   ____________________________________________  RADIOLOGY   ____________________________________________   PROCEDURES  Procedure(s) performed:   Procedures  Critical Care performed:   ____________________________________________   INITIAL IMPRESSION / ASSESSMENT AND PLAN / ED COURSE  Pertinent labs & imaging results that were available during my care of the patient were reviewed by me and considered in my medical decision making (see chart for details).  Ddx: UTI, dehydration, diet induced foul-smelling urine, hyperbilirubinemia, hematuria As part of my medical decision making, I reviewed the following data within the electronic medical  record:  Notes from prior ED visits  ----------------------------------------- 1:14 PM on 03/05/2017 -----------------------------------------  Patient with nitrite positive urine with many bacteria.  She will be treated with Keflex.  No further complaints at this time. The diagnosis as well as the treatment plan to the patient and she is understanding and willing to comply.     ____________________________________________   FINAL CLINICAL IMPRESSION(S) / ED DIAGNOSES  UTI.    NEW MEDICATIONS STARTED DURING THIS VISIT:  New Prescriptions   No medications on file     Note:  This document was prepared using Dragon voice recognition software and may include unintentional dictation errors.     Schaevitz, David Matthew, MD 03/05/17 1315  

## 2017-03-05 NOTE — ED Triage Notes (Signed)
Pt arrived via ems from group home - they reported that pt has recurrent UTI's and they want her evaluated for another one - group home and pt both report no sxs of UTI and that urine appears normal

## 2017-03-05 NOTE — ED Notes (Addendum)
Pt climbed out the foot of bed and was sitting in chair in room - pivoted back to bed - pt reminded not to get up without assistance

## 2017-03-05 NOTE — ED Notes (Signed)
[facility] and [facility] states she will come and get the pt

## 2017-03-05 NOTE — ED Notes (Signed)
Pt was moved to hall for closer observation because she keeps taking call bell off and letting the side rail down and attempting to stand without assistance - pt is unsteady on feet and has shoes that slid on the floor

## 2017-03-05 NOTE — ED Notes (Signed)
Actual name of care home is [facility]

## 2017-03-05 NOTE — ED Notes (Signed)
Pt released to GarySusan with group care home

## 2017-03-05 NOTE — ED Notes (Signed)
Called group home and had to leave a message for Darl PikesSusan to return call for a timely way to return pt to home

## 2017-03-05 NOTE — ED Notes (Addendum)
[name] and she states she is still going to come and get the pt just to release her to the waiting room and give her the paperwork - d/t the fact that pt has hx of falls and is a pivot to and from wheelchair/toilet this nurse is not comfortable releasing this pt to the waiting room d/t falls risk - charge nurse is aware

## 2017-03-05 NOTE — ED Notes (Signed)
Pt noted to be sitting in chair in room - reminded pt that she needed to call for assistance when getting up - pt refuses to get back in bed stating that she is ready to go home - will contact group home again about transportation home

## 2017-03-07 LAB — URINE CULTURE

## 2017-03-30 ENCOUNTER — Emergency Department
Admission: EM | Admit: 2017-03-30 | Discharge: 2017-03-30 | Disposition: A | Discharge status: Home / Self Care | Payer: Medicaid Other | Attending: Emergency Medicine | Admitting: Emergency Medicine

## 2017-03-30 ENCOUNTER — Other Ambulatory Visit: Payer: Self-pay

## 2017-03-30 DIAGNOSIS — F17210 Nicotine dependence, cigarettes, uncomplicated: Secondary | ICD-10-CM | POA: Insufficient documentation

## 2017-03-30 DIAGNOSIS — Z79899 Other long term (current) drug therapy: Secondary | ICD-10-CM | POA: Insufficient documentation

## 2017-03-30 DIAGNOSIS — Z7984 Long term (current) use of oral hypoglycemic drugs: Secondary | ICD-10-CM | POA: Insufficient documentation

## 2017-03-30 DIAGNOSIS — E119 Type 2 diabetes mellitus without complications: Secondary | ICD-10-CM | POA: Insufficient documentation

## 2017-03-30 DIAGNOSIS — M1711 Unilateral primary osteoarthritis, right knee: Secondary | ICD-10-CM | POA: Insufficient documentation

## 2017-03-30 DIAGNOSIS — M25561 Pain in right knee: Secondary | ICD-10-CM | POA: Insufficient documentation

## 2017-03-30 DIAGNOSIS — G8929 Other chronic pain: Secondary | ICD-10-CM | POA: Insufficient documentation

## 2017-03-30 DIAGNOSIS — I10 Essential (primary) hypertension: Secondary | ICD-10-CM | POA: Insufficient documentation

## 2017-03-30 MED ORDER — TRAMADOL HCL 50 MG PO TABS: 50.0000 mg | ORAL_TABLET | Freq: Two times a day (BID) | ORAL | 0 refills | Status: AC

## 2017-03-30 NOTE — ED Notes (Signed)
See triage note  Presents she is having pain to right leg  Denies any recent  trauma  States her leg has been hurting for more then 1-2 weeks

## 2017-03-30 NOTE — ED Triage Notes (Signed)
Pt came to ED via EMS, c/o right leg pain. History of chronic leg pain. Pt reports fell a year ago and has been having problems with it ever since. Does have surgery scheduled.

## 2017-03-30 NOTE — Discharge Instructions (Signed)
You should take the pain medicine as needed. Follow-up with your primary provider or ortho provider for further management.

## 2017-03-30 NOTE — ED Provider Notes (Signed)
[facility] Emergency Department Provider Note ____________________________________________  Time seen: 1033  I have reviewed the triage vital signs and the nursing notes.  HISTORY  Chief Complaint  Leg Pain  HPI [name] [name] is a 52 y.o. female presents to the ED via EMS from her assisted living facility, for evaluation of chronic right knee pain.  Patient by report is scheduled to see Dr. Rudene Christians for total knee replacement at the end of the month.  She was seen on the 23rd of this month by her cardiologist for medical clearance.  It appears that her previous medication for tramadol was not refilled at that time.  Patient describes ongoing pain to the right knee without any new injury, accident, or trauma.  Past Medical History:  Diagnosis Date  . Anxiety   . Arthritis   . Diabetes mellitus without complication ([place])   . Hypertension   . Stroke [facility]) 2004   x 2    Patient Active Problem List   Diagnosis Date Noted  . Urinary incontinence 01/25/2016  . Fecal incontinence 01/25/2016  . Dysthymia 01/25/2016    Past Surgical History:  Procedure Laterality Date  . NO PAST SURGERIES      Prior to Admission medications   Medication Sig Start Date End Date Taking? Authorizing Provider  atorvastatin (LIPITOR) 40 MG tablet Take 40 mg by mouth daily.   Yes [provider]  metFORMIN (GLUMETZA) 500 MG (MOD) 24 hr tablet Take 500 mg by mouth daily with breakfast.   Yes [provider]  amLODipine (NORVASC) 5 MG tablet Take 1 tablet (5 mg total) by mouth daily. Patient not taking: Reported on 02/19/2017 02/02/16 02/01/17  Harvest Dark, MD  blood glucose meter kit and supplies KIT Dispense based on patient and insurance preference. Use up to four times daily as directed. (FOR ICD-9 250.00, 250.01). 02/02/16   Harvest Dark, MD  glyBURIDE (DIABETA) 5 MG tablet Take 2 tablets (10 mg total) by mouth daily with breakfast. Patient not  taking: Reported on 02/19/2017 02/02/16   Harvest Dark, MD  insulin aspart (NOVOLOG) 100 UNIT/ML injection Please check blood sugar twice daily (morning and afternoon) Please administer insulin subcutaneously as follows for blood glucose reading: BG 200-250: 2 units SQ BG 251-300: 4 units SQ BG 301-350: 6 units SQ BG >350: 8 units SQ Patient not taking: Reported on 02/19/2017 02/02/16 02/01/17  Harvest Dark, MD  insulin glargine (LANTUS) 100 UNIT/ML injection Inject 0.65 mLs (65 Units total) into the skin at bedtime. Patient not taking: Reported on 02/19/2017 12/18/15   Carrie Mew, MD  lisinopril (PRINIVIL,ZESTRIL) 20 MG tablet Take 1 tablet (20 mg total) by mouth every morning. Patient not taking: Reported on 02/19/2017 02/02/16   Harvest Dark, MD  traMADol (ULTRAM) 50 MG tablet Take 1 tablet (50 mg total) by mouth 2 (two) times daily for 10 days. 03/30/17 04/09/17  Candelario Steppe, Dannielle Karvonen, PA-C    Allergies Latex  No family history on file.  Social History Social History   Tobacco Use  . Smoking status: Current Every Day Smoker    Packs/day: 1.00    Types: Cigarettes  . Smokeless tobacco: Never Used  Substance Use Topics  . Alcohol use: No  . Drug use: No    Review of Systems  Constitutional: Negative for fever. Cardiovascular: Negative for chest pain. Respiratory: Negative for shortness of breath.  Musculoskeletal: Negative for back pain. Right knee pain Skin: Negative for rash. Neurological: Negative for headaches, focal weakness  or numbness. ____________________________________________  PHYSICAL EXAM:  VITAL SIGNS: ED Triage Vitals  Enc Vitals Group     BP 03/30/17 0924 (!) 139/94     Pulse Rate 03/30/17 0924 76     Resp 03/30/17 0924 20     Temp 03/30/17 0924 98.5 F (36.9 C)     Temp Source 03/30/17 0924 Oral     SpO2 03/30/17 0924 100 %     Weight 03/30/17 0924 180 lb (81.6 kg)     Height 03/30/17 0924 5' 8"  (1.727 m)     Head  Circumference --      Peak Flow --      Pain Score 03/30/17 0927 9     Pain Loc --      Pain Edu? --      Excl. in [place]? --     Constitutional: Alert and oriented. Well appearing and in no distress. Head: Normocephalic and atraumatic. Cardiovascular: Normal rate, regular rhythm. Normal distal pulses. Respiratory: Normal respiratory effort. No wheezes/rales/rhonchi. Musculoskeletal: Right knee with chronic changes consistent with DJD. Normal ROM. Nontender with normal range of motion in all extremities.  Neurologic:  Normal speech and language. No gross focal neurologic deficits are appreciated. Skin:  Skin is warm, dry and intact. No rash noted. ____________________________________________  INITIAL IMPRESSION / ASSESSMENT AND PLAN / ED COURSE  Patient with chronic right knee pain due to underlying severe DJD. She is apparently scheduled for a TKR on 2/26 with Dr. Rudene Christians. She is discharged with a prescription for Tramadol #20 to dose for moderate pain. She will continue to dose her other meds, including Meloxicam. She will follow-up with ortho or her PCP as needed.   I reviewed the patient's prescription history over the last 12 months in the multi-state controlled substances database(s) that includes [place], [place], [place], [place], [place], [place], [place], [place], [place], [place], [place], [place], [place], and [place].  Results were notable for no recent prescriptions. ____________________________________________  FINAL CLINICAL IMPRESSION(S) / ED DIAGNOSES  Final diagnoses:  Chronic pain of right knee  Primary osteoarthritis of right knee      Melvenia Needles, PA-C 03/30/17 1900    Nena Polio, MD 04/01/17 [phone]

## 2017-04-02 ENCOUNTER — Other Ambulatory Visit: Payer: Self-pay

## 2017-04-02 ENCOUNTER — Encounter: Payer: Self-pay | Admitting: Emergency Medicine

## 2017-04-02 ENCOUNTER — Emergency Department
Admission: EM | Admit: 2017-04-02 | Discharge: 2017-04-18 | Disposition: A | Discharge status: Home / Self Care | Payer: No Typology Code available for payment source | Attending: Emergency Medicine | Admitting: Emergency Medicine

## 2017-04-02 DIAGNOSIS — Z789 Other specified health status: Secondary | ICD-10-CM | POA: Insufficient documentation

## 2017-04-02 DIAGNOSIS — Z794 Long term (current) use of insulin: Secondary | ICD-10-CM | POA: Insufficient documentation

## 2017-04-02 DIAGNOSIS — I10 Essential (primary) hypertension: Secondary | ICD-10-CM | POA: Insufficient documentation

## 2017-04-02 DIAGNOSIS — Z79899 Other long term (current) drug therapy: Secondary | ICD-10-CM | POA: Insufficient documentation

## 2017-04-02 DIAGNOSIS — F341 Dysthymic disorder: Secondary | ICD-10-CM | POA: Diagnosis present

## 2017-04-02 DIAGNOSIS — F329 Major depressive disorder, single episode, unspecified: Secondary | ICD-10-CM | POA: Insufficient documentation

## 2017-04-02 DIAGNOSIS — Z8673 Personal history of transient ischemic attack (TIA), and cerebral infarction without residual deficits: Secondary | ICD-10-CM | POA: Insufficient documentation

## 2017-04-02 DIAGNOSIS — Z91040 Latex allergy status: Secondary | ICD-10-CM | POA: Insufficient documentation

## 2017-04-02 DIAGNOSIS — F419 Anxiety disorder, unspecified: Secondary | ICD-10-CM

## 2017-04-02 DIAGNOSIS — F17210 Nicotine dependence, cigarettes, uncomplicated: Secondary | ICD-10-CM | POA: Insufficient documentation

## 2017-04-02 DIAGNOSIS — E119 Type 2 diabetes mellitus without complications: Secondary | ICD-10-CM | POA: Insufficient documentation

## 2017-04-02 DIAGNOSIS — N3000 Acute cystitis without hematuria: Secondary | ICD-10-CM | POA: Insufficient documentation

## 2017-04-02 LAB — URINE DRUG SCREEN, QUALITATIVE (ARMC ONLY)
Amphetamines, Ur Screen: NOT DETECTED
Barbiturates, Ur Screen: NOT DETECTED
Benzodiazepine, Ur Scrn: NOT DETECTED
CANNABINOID 50 NG, UR ~~LOC~~: NOT DETECTED
COCAINE METABOLITE, UR ~~LOC~~: NOT DETECTED
MDMA (Ecstasy)Ur Screen: NOT DETECTED
Methadone Scn, Ur: NOT DETECTED
OPIATE, UR SCREEN: NOT DETECTED
PHENCYCLIDINE (PCP) UR S: NOT DETECTED
Tricyclic, Ur Screen: NOT DETECTED

## 2017-04-02 LAB — CBC WITH DIFFERENTIAL/PLATELET
BASOS ABS: 0 10*3/uL (ref 0–0.1)
BASOS PCT: 1 %
EOS ABS: 0.2 10*3/uL (ref 0–0.7)
Eosinophils Relative: 3 %
HEMATOCRIT: 36.5 % (ref 35.0–47.0)
HEMOGLOBIN: 12.1 g/dL (ref 12.0–16.0)
Lymphocytes Relative: 43 %
Lymphs Abs: 2.6 10*3/uL (ref 1.0–3.6)
MCH: 30.7 pg (ref 26.0–34.0)
MCHC: 33.2 g/dL (ref 32.0–36.0)
MCV: 92.3 fL (ref 80.0–100.0)
MONO ABS: 0.6 10*3/uL (ref 0.2–0.9)
MONOS PCT: 10 %
NEUTROS ABS: 2.6 10*3/uL (ref 1.4–6.5)
NEUTROS PCT: 43 %
Platelets: 323 10*3/uL (ref 150–440)
RBC: 3.96 MIL/uL (ref 3.80–5.20)
RDW: 12.9 % (ref 11.5–14.5)
WBC: 6 10*3/uL (ref 3.6–11.0)

## 2017-04-02 LAB — URINALYSIS, COMPLETE (UACMP) WITH MICROSCOPIC
BILIRUBIN URINE: NEGATIVE
GLUCOSE, UA: 50 mg/dL — AB
HGB URINE DIPSTICK: NEGATIVE
Ketones, ur: NEGATIVE mg/dL
LEUKOCYTES UA: NEGATIVE
NITRITE: NEGATIVE
Protein, ur: NEGATIVE mg/dL
SPECIFIC GRAVITY, URINE: 1.025 (ref 1.005–1.030)
pH: 6 (ref 5.0–8.0)

## 2017-04-02 LAB — COMPREHENSIVE METABOLIC PANEL
ALT: 25 U/L (ref 14–54)
ANION GAP: 10 (ref 5–15)
AST: 19 U/L (ref 15–41)
Albumin: 3.5 g/dL (ref 3.5–5.0)
Alkaline Phosphatase: 84 U/L (ref 38–126)
BUN: 23 mg/dL — ABNORMAL HIGH (ref 6–20)
CHLORIDE: 108 mmol/L (ref 101–111)
CO2: 25 mmol/L (ref 22–32)
Calcium: 9.3 mg/dL (ref 8.9–10.3)
Creatinine, Ser: 0.89 mg/dL (ref 0.44–1.00)
Glucose, Bld: 119 mg/dL — ABNORMAL HIGH (ref 65–99)
POTASSIUM: 3.6 mmol/L (ref 3.5–5.1)
SODIUM: 143 mmol/L (ref 135–145)
Total Bilirubin: 0.4 mg/dL (ref 0.3–1.2)
Total Protein: 7.7 g/dL (ref 6.5–8.1)

## 2017-04-02 LAB — ACETAMINOPHEN LEVEL

## 2017-04-02 LAB — SALICYLATE LEVEL

## 2017-04-02 LAB — ETHANOL

## 2017-04-02 MED ORDER — CEPHALEXIN 500 MG PO CAPS
500.0000 mg | ORAL_CAPSULE | Freq: Once | ORAL | Status: AC
  Administered 2017-04-02: 500 mg via ORAL
  Filled 2017-04-02: qty 1

## 2017-04-02 MED ORDER — CEPHALEXIN 500 MG PO CAPS: 500.0000 mg | ORAL_CAPSULE | Freq: Four times a day (QID) | ORAL | 0 refills | Status: AC

## 2017-04-02 NOTE — ED Triage Notes (Signed)
Patient from home via [facility] in custody of [employer]. Patient's daughter reports patient has not been taking medication as prescribed and has been having some behavioral issues including urinating in inappropriate places. Patient alert and oriented upon arrival. Patient reports she feels fine except for knee pain which is chronic. Patient states "I don't want to be here" but is calm and cooperative at this time.

## 2017-04-02 NOTE — ED Provider Notes (Signed)
[facility] Emergency Department Provider Note  ____________________________________________   First MD Initiated Contact with Patient 04/02/17 1259     (approximate)  I have reviewed the triage vital signs and the nursing notes.   HISTORY  Chief Complaint IVC  Level 5 exemption history limited by the patient's clinical condition  HPI [name] [name] is a 52 y.o. female who was brought to the emergency department via EMS on an involuntary commitment.  Apparently the patient's family called 34 today because the patient has not been behaving normally.  The IVC reads as below:  The patient's involuntary commitment from police reads: "Respond and has been committed to times in the past.  Respond and is not taking her medication.  She respond and is overeating and not bathing.  Respond and seems unaware of her surroundings.  Respond and is using the bathroom on the floors of the residents.  Respondent needs to be evaluated for her own well-being."  The patient herself is unable to provide any meaningful history.  She says she has pain in both of her knees.  She is unable to provide any further history.  Past Medical History:  Diagnosis Date  . Anxiety   . Arthritis   . Diabetes mellitus without complication ([place])   . Hypertension   . Stroke [facility]) 2004   x 2    Patient Active Problem List   Diagnosis Date Noted  . Urinary incontinence 01/25/2016  . Fecal incontinence 01/25/2016  . Dysthymia 01/25/2016    Past Surgical History:  Procedure Laterality Date  . NO PAST SURGERIES      Prior to Admission medications   Medication Sig Start Date End Date Taking? Authorizing Provider  amLODipine (NORVASC) 5 MG tablet Take 1 tablet (5 mg total) by mouth daily. Patient not taking: Reported on 02/19/2017 02/02/16 02/01/17  Harvest Dark, MD  atorvastatin (LIPITOR) 40 MG tablet Take 40 mg by mouth daily.    [provider]  blood glucose meter  kit and supplies KIT Dispense based on patient and insurance preference. Use up to four times daily as directed. (FOR ICD-9 250.00, 250.01). 02/02/16   Harvest Dark, MD  cephALEXin (KEFLEX) 500 MG capsule Take 1 capsule (500 mg total) by mouth 4 (four) times daily for 5 days. 04/02/17 04/07/17  Darel Hong, MD  glyBURIDE (DIABETA) 5 MG tablet Take 2 tablets (10 mg total) by mouth daily with breakfast. Patient not taking: Reported on 02/19/2017 02/02/16   Harvest Dark, MD  insulin aspart (NOVOLOG) 100 UNIT/ML injection Please check blood sugar twice daily (morning and afternoon) Please administer insulin subcutaneously as follows for blood glucose reading: BG 200-250: 2 units SQ BG 251-300: 4 units SQ BG 301-350: 6 units SQ BG >350: 8 units SQ Patient not taking: Reported on 02/19/2017 02/02/16 02/01/17  Harvest Dark, MD  insulin glargine (LANTUS) 100 UNIT/ML injection Inject 0.65 mLs (65 Units total) into the skin at bedtime. Patient not taking: Reported on 02/19/2017 12/18/15   Carrie Mew, MD  lisinopril (PRINIVIL,ZESTRIL) 20 MG tablet Take 1 tablet (20 mg total) by mouth every morning. Patient not taking: Reported on 02/19/2017 02/02/16   Harvest Dark, MD  metFORMIN (GLUMETZA) 500 MG (MOD) 24 hr tablet Take 500 mg by mouth daily with breakfast.    [provider]  traMADol (ULTRAM) 50 MG tablet Take 1 tablet (50 mg total) by mouth 2 (two) times daily for 10 days. 03/30/17 04/09/17  Menshew, Dannielle Karvonen, PA-C  Allergies Latex  No family history on file.  Social History Social History   Tobacco Use  . Smoking status: Current Every Day Smoker    Packs/day: 1.00    Types: Cigarettes  . Smokeless tobacco: Never Used  Substance Use Topics  . Alcohol use: No  . Drug use: No    Review of Systems Level 5 exemption history limited by the patient's clinical condition ____________________________________________   PHYSICAL EXAM:  VITAL  SIGNS: ED Triage Vitals  Enc Vitals Group     BP      Pulse      Resp      Temp      Temp src      SpO2      Weight      Height      Head Circumference      Peak Flow      Pain Score      Pain Loc      Pain Edu?      Excl. in [place]?     Constitutional: Disheveled malodorous.  No acute distress.  Heavy smell of urine Eyes: PERRL EOMI. mid range and brisk Head: Atraumatic. Nose: No congestion/rhinnorhea. Mouth/Throat: No trismus Neck: No stridor.   Cardiovascular: Normal rate, regular rhythm. Grossly normal heart sounds.  Good peripheral circulation. Respiratory: Normal respiratory effort.  No retractions. Lungs CTAB and moving good air Gastrointestinal: Soft nontender Musculoskeletal: No lower extremity edema   Neurologic:  No gross focal neurologic deficits are appreciated. Skin:  Skin is warm, dry and intact. No rash noted. Psychiatric: Bizarre affect    ____________________________________________   DIFFERENTIAL includes but not limited to  Psychiatric disease, urinary tract infection, dehydration, metabolic derangement ____________________________________________   LABS (all labs ordered are listed, but only abnormal results are displayed)  Labs Reviewed  COMPREHENSIVE METABOLIC PANEL - Abnormal; Notable for the following components:      Result Value   Glucose, Bld 119 (*)    BUN 23 (*)    All other components within normal limits  ACETAMINOPHEN LEVEL - Abnormal; Notable for the following components:   Acetaminophen (Tylenol), Serum <10 (*)    All other components within normal limits  URINE CULTURE  ETHANOL  SALICYLATE LEVEL  CBC WITH DIFFERENTIAL/PLATELET  URINALYSIS, COMPLETE (UACMP) WITH MICROSCOPIC  URINE DRUG SCREEN, QUALITATIVE ([facility] ONLY)    Lab work reviewed by me consistent with possible urinary tract  infection __________________________________________  EKG   ____________________________________________  RADIOLOGY   ____________________________________________   PROCEDURES  Procedure(s) performed: no  Procedures  Critical Care performed: no  Observation: no ____________________________________________   INITIAL IMPRESSION / ASSESSMENT AND PLAN / ED COURSE  Pertinent labs & imaging results that were available during my care of the patient were reviewed by me and considered in my medical decision making (see chart for details).  The patient is disorganized and confused.  I will up hold her involuntary commitment.  Urinalysis is consistent with possible infection I will treat her with a dose of Keflex now and Keflex as an outpatient.  Disposition pending psychiatry recommendations.      ____________________________________________   FINAL CLINICAL IMPRESSION(S) / ED DIAGNOSES  Final diagnoses:  Acute cystitis without hematuria      NEW MEDICATIONS STARTED DURING THIS VISIT:  New Prescriptions   CEPHALEXIN (KEFLEX) 500 MG CAPSULE    Take 1 capsule (500 mg total) by mouth 4 (four) times daily for 5 days.     Note:  This document was prepared  using Systems analyst and may include unintentional dictation errors.     Darel Hong, MD 04/02/17 1551

## 2017-04-02 NOTE — Discharge Instructions (Addendum)
Call your regular doctor to schedule the next available appointment to follow up on today?s ED visit, or return immediately to the ED if your pain worsens, you have decreased urine production, develop fever, persistent vomiting, or other symptoms that concern you. ° °

## 2017-04-02 NOTE — BH Assessment (Signed)
Assessment Note  [name] [name] is an 52 y.o. female who presents to the ER via law enforcement due to her family having concerns about her behaviors and non-compliance with medications. According to the patient she does not know why she was brought to the ER. She states she lives with her daughter and she wanted her to "come up here, so that's what I did."   Per the report of the patient's cousin Patrcia Dolly D. [phone]), for the last several weeks the patient have been using the bathroom on herself and "aint actting right." She's not taking her medications and family are unable to safely manage her.  The cousin further states, the patient recently moved back in with her daughter Patsey Berthold), approximately two weeks ago. Prior to that, she was living in a group home and at one point she was living with the cousin. She have lived in multiple group homes and each have asked her to leave due to not taking her medications and start to decompensate.  During the interview, the patient was calm, cooperative and pleasant. She was able to answer majority of the questions. She denies the SI/HI and AV/H.   Diagnosis: Depression  Past Medical History:  Past Medical History:  Diagnosis Date  . Anxiety   . Arthritis   . Diabetes mellitus without complication (HCC)   . Hypertension   . Stroke [facility]) 2004   x 2    Past Surgical History:  Procedure Laterality Date  . NO PAST SURGERIES      Family History: No family history on file.  Social History:  reports that she has been smoking cigarettes.  She has been smoking about 1.00 pack per day. she has never used smokeless tobacco. She reports that she does not drink alcohol or use drugs.  Additional Social History:  Alcohol / Drug Use Pain Medications: See PTA Prescriptions: See PTA Over the Counter: See PTA History of alcohol / drug use?: Yes Longest period of sobriety (when/how long): 3 Years Negative Consequences of Use: (n/a) Withdrawal  Symptoms: (n/a) Substance #1 1 - Age of First Use: 13 1 - Last Use / Amount: 3 years ago Substance #2 Name of Substance 2: Marijuana 2 - Age of First Use: 13 2 - Last Use / Amount: 3 years ago  CIWA: CIWA-Ar BP: (!) 150/90 Pulse Rate: 79 COWS:    Allergies:  Allergies  Allergen Reactions  . Latex Rash    Severe itching.    Home Medications:  (Not in a hospital admission)  OB/GYN Status:  No LMP recorded. Patient is not currently having periods (Reason: Perimenopausal).  General Assessment Data Location of Assessment: [facility] ED TTS Assessment: In system Is this a Tele or Face-to-Face Assessment?: Face-to-Face Is this an Initial Assessment or a Re-assessment for this encounter?: Initial Assessment Marital status: Single Maiden name: n/a Is patient pregnant?: No Pregnancy Status: No Living Arrangements: Children, Other relatives Can pt return to current living arrangement?: Yes Admission Status: Involuntary Is patient capable of signing voluntary admission?: No(Under IVC) Referral Source: Self/Family/Friend Insurance type: None  Medical Screening Exam [facility] Walk-in ONLY) Medical Exam completed: Yes  Crisis Care Plan Living Arrangements: Children, Other relatives Legal Guardian: Other:(Self) Name of Psychiatrist: Reports of none Name of Therapist: Reports of none  Education Status Is patient currently in school?: No Current Grade: n/a Highest grade of school patient has completed: Some college Name of school: [employer] person: n/a  Risk to self with the past 6 months  Suicidal Ideation: No Has patient been a risk to self within the past 6 months prior to admission? : No Suicidal Intent: No Has patient had any suicidal intent within the past 6 months prior to admission? : No Is patient at risk for suicide?: No Suicidal Plan?: No Has patient had any suicidal plan within the past 6 months prior to admission? : No Access to Means: No What has  been your use of drugs/alcohol within the last 12 months?: Reports of no current use Previous Attempts/Gestures: No How many times?: 0 Other Self Harm Risks: Reports of none Triggers for Past Attempts: None known Intentional Self Injurious Behavior: None Family Suicide History: No Recent stressful life event(s): Other (Comment)(No longer taking medications) Persecutory voices/beliefs?: No Depression: No Depression Symptoms: Isolating, Despondent Substance abuse history and/or treatment for substance abuse?: No Suicide prevention information given to non-admitted patients: Not applicable  Risk to Others within the past 6 months Homicidal Ideation: No Does patient have any lifetime risk of violence toward others beyond the six months prior to admission? : No Thoughts of Harm to Others: No Current Homicidal Intent: No Current Homicidal Plan: No Access to Homicidal Means: No Identified Victim: Reports of none History of harm to others?: No Violent Behavior Description: Reports of none Does patient have access to weapons?: No Criminal Charges Pending?: No Does patient have a court date: No Is patient on probation?: No  Psychosis Hallucinations: None noted Delusions: Unspecified  Mental Status Report Appearance/Hygiene: Unremarkable, In scrubs Eye Contact: Fair Motor Activity: Unable to assess(Patient laying in the bed) Speech: Soft Level of Consciousness: Alert Mood: Anxious, Sad, Pleasant Affect: Appropriate to circumstance, Blunted, Sad Anxiety Level: Minimal Thought Processes: Coherent Judgement: Partial Orientation: Person, Appropriate for developmental age Obsessive Compulsive Thoughts/Behaviors: Minimal  Cognitive Functioning Concentration: Normal Memory: Remote Intact, Recent Impaired IQ: Average Insight: Fair Impulse Control: Fair Appetite: Fair Weight Loss: 0 Weight Gain: 0 Sleep: No Change Total Hours of Sleep: 7 Vegetative Symptoms: None  ADLScreening  [facility] Assessment Services) Patient's cognitive ability adequate to safely complete daily activities?: Yes Patient able to express need for assistance with ADLs?: Yes Independently performs ADLs?: Yes (appropriate for developmental age)  Prior Inpatient Therapy Prior Inpatient Therapy: No Prior Therapy Dates: n/a Prior Therapy Facilty/Provider(s): n/a Reason for Treatment: n/a  Prior Outpatient Therapy Prior Outpatient Therapy: Yes Prior Therapy Dates: 2017 Prior Therapy Facilty/Provider(s): [facility] Reason for Treatment: Psychosis Does patient have an ACCT team?: No Does patient have Intensive In-House Services?  : No Does patient have [facility] services? : No Does patient have P4CC services?: No  ADL Screening (condition at time of admission) Patient's cognitive ability adequate to safely complete daily activities?: Yes Is the patient deaf or have difficulty hearing?: No Does the patient have difficulty seeing, even when wearing glasses/contacts?: No Does the patient have difficulty concentrating, remembering, or making decisions?: No Patient able to express need for assistance with ADLs?: Yes Does the patient have difficulty dressing or bathing?: No Independently performs ADLs?: Yes (appropriate for developmental age) Does the patient have difficulty walking or climbing stairs?: No Weakness of Legs: None Weakness of Arms/Hands: None  Home Assistive Devices/Equipment Home Assistive Devices/Equipment: None  Therapy Consults (therapy consults require a physician order) PT Evaluation Needed: No OT Evalulation Needed: No SLP Evaluation Needed: No Abuse/Neglect Assessment (Assessment to be complete while patient is alone) Abuse/Neglect Assessment Can Be Completed: Yes Physical Abuse: Denies Verbal Abuse: Denies Sexual Abuse: Denies Exploitation of patient/patient's resources: Denies Self-Neglect: Denies Values /  Beliefs Cultural Requests During  Hospitalization: None Spiritual Requests During Hospitalization: None Consults Spiritual Care Consult Needed: No Social Work Consult Needed: No Merchant navy officerAdvance Directives (For Healthcare) Does Patient Have a Medical Advance Directive?: No Would patient like information on creating a medical advance directive?: No - Patient declined    Additional Information 1:1 In Past 12 Months?: No CIRT Risk: No Elopement Risk: No Does patient have medical clearance?: Yes  Child/Adolescent Assessment Running Away Risk: Denies(Patient is an adult)  Disposition:  Disposition Initial Assessment Completed for this Encounter: Yes Disposition of Patient: Pending Review with psychiatrist  On Site Evaluation by:   Reviewed with Physician:    Lilyan Gilfordalvin J. Elery Cadenhead MS, LCAS, LPC, NCC, CCSI Therapeutic Triage Specialist 04/02/2017 3:59 PM

## 2017-04-02 NOTE — ED Notes (Signed)
Sherilyn CooterHenry RN called the patient's cousin Patrcia Dolly([address] [phone]) whom the Pt lived with to come and pick up the Pt. He stated that the Pt no longer stays with him and that she now stays with her daughter but her daughter has no phone and that he knows that the Pt's daughter can not come to pick up the Pt today.    Charge nurse and ED MD notified.

## 2017-04-02 NOTE — ED Notes (Signed)
Patient attempted to obtain urine sample. States that she is not able to urinate at this time. Patient verbalized understanding to let staff know when she needs to urinate.

## 2017-04-02 NOTE — ED Notes (Signed)
Dr Clapacs rescinded IVC papers. 

## 2017-04-02 NOTE — ED Notes (Signed)
Pt to bathroom by wheelchair and this tech. Pt states she is unable to walk. Pt moved from bed to wheelchair and from chair to toilet and vice versa. Pt did not use the bathroom and stated she couldn't right now. Pt is aware that urine is needed.

## 2017-04-02 NOTE — ED Notes (Signed)
Sherilyn CooterHenry RN called the Pt's son Darrelyn HillockGregory Routt. [phone] and left a message to call back.

## 2017-04-02 NOTE — Consult Note (Signed)
[facility] Consult   Reason for Consult: Consult for 52 year old woman with an unclear past psychiatric history brought in under commitment filed by her family Referring Physician: Rip Harbour Patient Identification: [name] [name] MRN:  [id number] Principal Diagnosis: Dysthymia Diagnosis:   Patient Active Problem List   Diagnosis Date Noted  . Anxiety [F41.9] 04/02/2017  . Urinary incontinence [R32] 01/25/2016  . Fecal incontinence [R15.9] 01/25/2016  . Dysthymia [F34.1] 01/25/2016    Total Time spent with patient: 1 hour  Subjective:   [name] [name] is a 52 y.o. female patient admitted with "they are lying".  HPI: Patient interviewed.  Chart reviewed 52 year old woman with uncertain past psychiatric history brought in under commitment.  Commitment papers filed by her cousin report that she is not taking care of herself and that she has been using the toilet on the floor.  Nothing mentioned about suicidality.  Patient says that she does not know why they brought her here.  She says that she lives with her daughter and her daughter's extended family and that no one had said anything to her about coming to the hospital.  Patient denies being depressed.  Denies feeling sad.  She does admit that she does not sleep well at night.  She says that she thinks she eats fine and she watches out for her blood sugars although she admits that she has no idea what a good blood sugar really is.  Patient denies any thoughts of self-harm or suicide.  Denies any thoughts of hurting anyone else.  Denies auditory or visual hallucinations.  She says that she sees Dr. Collie Siad for outpatient treatment and takes Xanax.  Social history: Patient lives with her daughter and her daughter's family.  She does not work outside the home.  Sounds like she has very little activity during the day.  Medical history: History of diabetes currently diagnosed with a urinary tract infection as well.  Chronic knee  pain and allegedly scheduled for a knee replacement later this month.  Substance abuse history: Denies alcohol or drug abuse nothing in the old chart about it.  Past Psychiatric History: Uncertain past psychiatric history.  Patient admits she has been in psychiatric hospitals a couple times before but cannot articulate a reason why.  Denies ever trying to kill herself.  Does not know of any diagnosis.  The only old records we have of her are not encounter I had with her a couple years ago at which time she also was essentially asymptomatic and was discharged home.  Patient appears to be somewhat slow and perhaps cognitively impaired.  Risk to Self: Suicidal Ideation: No Suicidal Intent: No Is patient at risk for suicide?: No Suicidal Plan?: No Access to Means: No What has been your use of drugs/alcohol within the last 12 months?: Reports of no current use How many times?: 0 Other Self Harm Risks: Reports of none Triggers for Past Attempts: None known Intentional Self Injurious Behavior: None Risk to Others: Homicidal Ideation: No Thoughts of Harm to Others: No Current Homicidal Intent: No Current Homicidal Plan: No Access to Homicidal Means: No Identified Victim: Reports of none History of harm to others?: No Violent Behavior Description: Reports of none Does patient have access to weapons?: No Criminal Charges Pending?: No Does patient have a court date: No Prior Inpatient Therapy: Prior Inpatient Therapy: No Prior Therapy Dates: n/a Prior Therapy Facilty/Provider(s): n/a Reason for Treatment: n/a Prior Outpatient Therapy: Prior Outpatient Therapy: Yes Prior Therapy Dates: 2017 Prior Therapy  Facilty/Provider(s): [facility] Reason for Treatment: Psychosis Does patient have an ACCT team?: No Does patient have Intensive In-House Services?  : No Does patient have [facility] services? : No Does patient have P4CC services?: No  Past Medical History:  Past Medical  History:  Diagnosis Date  . Anxiety   . Arthritis   . Diabetes mellitus without complication ([place])   . Hypertension   . Stroke [facility]) 2004   x 2    Past Surgical History:  Procedure Laterality Date  . NO PAST SURGERIES     Family History: No family history on file. Family Psychiatric  History: Denies any Social History:  Social History   Substance and Sexual Activity  Alcohol Use No     Social History   Substance and Sexual Activity  Drug Use No    Social History   Socioeconomic History  . Marital status: Single    Spouse name: None  . Number of children: None  . Years of education: None  . Highest education level: None  Social Needs  . Financial resource strain: None  . Food insecurity - worry: None  . Food insecurity - inability: None  . Transportation needs - medical: None  . Transportation needs - non-medical: None  Occupational History  . None  Tobacco Use  . Smoking status: Current Every Day Smoker    Packs/day: 1.00    Types: Cigarettes  . Smokeless tobacco: Never Used  Substance and Sexual Activity  . Alcohol use: No  . Drug use: No  . Sexual activity: None  Other Topics Concern  . None  Social History Narrative  . None   Additional Social History:    Allergies:   Allergies  Allergen Reactions  . Latex Rash    Severe itching.    Labs:  Results for orders placed or performed during the hospital encounter of 04/02/17 (from the past 48 hour(s))  Comprehensive metabolic panel     Status: Abnormal   Collection Time: 04/02/17  1:15 PM  Result Value Ref Range   Sodium 143 135 - 145 mmol/L   Potassium 3.6 3.5 - 5.1 mmol/L   Chloride 108 101 - 111 mmol/L   CO2 25 22 - 32 mmol/L   Glucose, Bld 119 (H) 65 - 99 mg/dL   BUN 23 (H) 6 - 20 mg/dL   Creatinine, Ser 0.89 0.44 - 1.00 mg/dL   Calcium 9.3 8.9 - 10.3 mg/dL   Total Protein 7.7 6.5 - 8.1 g/dL   Albumin 3.5 3.5 - 5.0 g/dL   AST 19 15 - 41 U/L   ALT 25 14 - 54 U/L   Alkaline Phosphatase  84 38 - 126 U/L   Total Bilirubin 0.4 0.3 - 1.2 mg/dL   GFR calc non Af Amer >60 >60 mL/min   GFR calc Af Amer >60 >60 mL/min    Comment: (NOTE) The eGFR has been calculated using the CKD EPI equation. This calculation has not been validated in all clinical situations. eGFR's persistently <60 mL/min signify possible Chronic Kidney Disease.    Anion gap 10 5 - 15    Comment: Performed at [facility], [address] [place], [place] [address]  Ethanol     Status: None   Collection Time: 04/02/17  1:15 PM  Result Value Ref Range   Alcohol, Ethyl (B) <10 <10 mg/dL    Comment:        LOWEST DETECTABLE LIMIT FOR SERUM ALCOHOL IS 10 mg/dL FOR  MEDICAL PURPOSES ONLY Performed at [facility], [address] [place], [place] [address]   Salicylate level     Status: None   Collection Time: 04/02/17  1:15 PM  Result Value Ref Range   Salicylate Lvl <1.6 2.8 - 30.0 mg/dL    Comment: Performed at [facility], [address] [place], [place] [address]  Acetaminophen level     Status: Abnormal   Collection Time: 04/02/17  1:15 PM  Result Value Ref Range   Acetaminophen (Tylenol), Serum <10 (L) 10 - 30 ug/mL    Comment:        THERAPEUTIC CONCENTRATIONS VARY SIGNIFICANTLY. A RANGE OF 10-30 ug/mL MAY BE AN EFFECTIVE CONCENTRATION FOR MANY PATIENTS. HOWEVER, SOME ARE BEST TREATED AT CONCENTRATIONS OUTSIDE THIS RANGE. ACETAMINOPHEN CONCENTRATIONS >150 ug/mL AT 4 HOURS AFTER INGESTION AND >50 ug/mL AT 12 HOURS AFTER INGESTION ARE OFTEN ASSOCIATED WITH TOXIC REACTIONS. Performed at [facility], [address] [place], [place] [address]   CBC with Differential     Status: None   Collection Time: 04/02/17  1:15 PM  Result Value Ref Range   WBC 6.0 3.6 - 11.0 K/uL   RBC 3.96 3.80 - 5.20 MIL/uL   Hemoglobin 12.1 12.0 - 16.0 g/dL   HCT 36.5 35.0 - 47.0 %   MCV 92.3 80.0 - 100.0 fL   MCH 30.7 26.0 - 34.0 pg   MCHC 33.2 32.0 - 36.0 g/dL   RDW  12.9 11.5 - 14.5 %   Platelets 323 150 - 440 K/uL   Neutrophils Relative % 43 %   Neutro Abs 2.6 1.4 - 6.5 K/uL   Lymphocytes Relative 43 %   Lymphs Abs 2.6 1.0 - 3.6 K/uL   Monocytes Relative 10 %   Monocytes Absolute 0.6 0.2 - 0.9 K/uL   Eosinophils Relative 3 %   Eosinophils Absolute 0.2 0 - 0.7 K/uL   Basophils Relative 1 %   Basophils Absolute 0.0 0 - 0.1 K/uL    Comment: Performed at [facility], [address] [place], [place] [address]    Current Facility-Administered Medications  Medication Dose Route Frequency Provider Last Rate Last Dose  . cephALEXin (KEFLEX) capsule 500 mg  500 mg Oral Once Darel Hong, MD       Current Outpatient Medications  Medication Sig Dispense Refill  . amLODipine (NORVASC) 5 MG tablet Take 1 tablet (5 mg total) by mouth daily. (Patient not taking: Reported on 02/19/2017) 30 tablet 1  . atorvastatin (LIPITOR) 40 MG tablet Take 40 mg by mouth daily.    . blood glucose meter kit and supplies KIT Dispense based on patient and insurance preference. Use up to four times daily as directed. (FOR ICD-9 250.00, 250.01). 1 each 0  . cephALEXin (KEFLEX) 500 MG capsule Take 1 capsule (500 mg total) by mouth 4 (four) times daily for 5 days. 20 capsule 0  . glyBURIDE (DIABETA) 5 MG tablet Take 2 tablets (10 mg total) by mouth daily with breakfast. (Patient not taking: Reported on 02/19/2017) 60 tablet 1  . insulin aspart (NOVOLOG) 100 UNIT/ML injection Please check blood sugar twice daily (morning and afternoon) Please administer insulin subcutaneously as follows for blood glucose reading: BG 200-250: 2 units SQ BG 251-300: 4 units SQ BG 301-350: 6 units SQ BG >350: 8 units SQ (Patient not taking: Reported on 02/19/2017) 10 mL 3  . insulin glargine (LANTUS) 100 UNIT/ML injection Inject 0.65 mLs (65 Units total) into the skin at  bedtime. (Patient not taking: Reported on 02/19/2017) 20 mL 0  . lisinopril (PRINIVIL,ZESTRIL) 20 MG tablet Take 1  tablet (20 mg total) by mouth every morning. (Patient not taking: Reported on 02/19/2017) 30 tablet 1  . metFORMIN (GLUMETZA) 500 MG (MOD) 24 hr tablet Take 500 mg by mouth daily with breakfast.    . traMADol (ULTRAM) 50 MG tablet Take 1 tablet (50 mg total) by mouth 2 (two) times daily for 10 days. 20 tablet 0    Musculoskeletal: Strength & Muscle Tone: within normal limits Gait & Station: normal Patient leans: N/A  Psychiatric Specialty Exam: Physical Exam  Nursing note and vitals reviewed. Constitutional: She appears well-developed and well-nourished.  HENT:  Head: Normocephalic and atraumatic.  Eyes: Conjunctivae are normal. Pupils are equal, round, and reactive to light.  Neck: Normal range of motion.  Cardiovascular: Regular rhythm and normal heart sounds.  Respiratory: Effort normal. No respiratory distress.  GI: Soft.  Musculoskeletal: Normal range of motion.  Neurological: She is alert.  Skin: Skin is warm and dry.  Psychiatric: Judgment normal. Her affect is blunt. Her speech is delayed. She is slowed. Thought content is not paranoid. Cognition and memory are impaired. She expresses no homicidal and no suicidal ideation.    Review of Systems  Constitutional: Negative.   HENT: Negative.   Eyes: Negative.   Respiratory: Negative.   Cardiovascular: Negative.   Gastrointestinal: Negative.   Musculoskeletal: Negative.   Skin: Negative.   Neurological: Negative.   Psychiatric/Behavioral: Negative for depression, hallucinations, memory loss, substance abuse and suicidal ideas. The patient is not nervous/anxious and does not have insomnia.     Blood pressure (!) 150/90, pulse 79, temperature 98.2 F (36.8 C), temperature source Oral, resp. rate 18, height 5' 8"  (1.727 m), weight 84.4 kg (186 lb), SpO2 100 %.Body mass index is 28.28 kg/m.  General Appearance: Disheveled  Eye Contact:  Minimal  Speech:  Slow  Volume:  Decreased  Mood:  Dysphoric  Affect:  Constricted   Thought Process:  Goal Directed  Orientation:  Full (Time, Place, and Person)  Thought Content:  Logical  Suicidal Thoughts:  No  Homicidal Thoughts:  No  Memory:  Immediate;   Fair Recent;   Fair Remote;   Fair  Judgement:  Fair  Insight:  Lacking  Psychomotor Activity:  Decreased  Concentration:  Concentration: Fair  Recall:  [employer] of Knowledge:  Fair  Language:  Fair  Akathisia:  No  Handed:  Right  AIMS (if indicated):     Assets:  Housing Resilience Social Support  ADL's:  Impaired  Cognition:  Impaired,  Mild  Sleep:        Treatment Plan Summary: Plan 52 year old woman with uncertain past psychiatric history who presents with commitment papers that are rather vague and mostly detail some poor self-care.  Patient is denying any suicidal or homicidal thoughts.  Denies any mood symptoms of any significance.  Denies psychotic symptoms.  Does not appear to meet commitment criteria nor to require inpatient treatment.  Patient will be taken off of IVC.  Case reviewed with ER physician.  She does appear to have a urinary tract infection and prescription has been written for that.  She can be discharged out to her regular outpatient follow-up.  Disposition: No evidence of imminent risk to self or others at present.   Patient does not meet criteria for psychiatric inpatient admission. Discussed crisis plan, support from social network, calling 911, coming to the Emergency Department,  and calling Suicide Hotline.  Alethia Berthold, MD 04/02/2017 4:52 PM

## 2017-04-03 LAB — GLUCOSE, CAPILLARY: Glucose-Capillary: 266 mg/dL — ABNORMAL HIGH (ref 65–99)

## 2017-04-03 MED ORDER — TRAMADOL HCL 50 MG PO TABS
50.0000 mg | ORAL_TABLET | Freq: Two times a day (BID) | ORAL | Status: DC
  Administered 2017-04-04 – 2017-04-18 (×30): 50 mg via ORAL
  Filled 2017-04-03 (×30): qty 1

## 2017-04-03 MED ORDER — METFORMIN HCL ER 500 MG PO TB24
500.0000 mg | ORAL_TABLET | Freq: Every day | ORAL | Status: DC
  Administered 2017-04-04 – 2017-04-18 (×15): 500 mg via ORAL
  Filled 2017-04-03 (×15): qty 1

## 2017-04-03 MED ORDER — AMLODIPINE BESYLATE 5 MG PO TABS
5.0000 mg | ORAL_TABLET | Freq: Every day | ORAL | Status: DC
  Administered 2017-04-04 – 2017-04-18 (×15): 5 mg via ORAL
  Filled 2017-04-03 (×15): qty 1

## 2017-04-03 MED ORDER — TRAMADOL HCL 50 MG PO TABS
50.0000 mg | ORAL_TABLET | Freq: Once | ORAL | Status: AC
  Administered 2017-04-03: 50 mg via ORAL

## 2017-04-03 MED ORDER — LISINOPRIL 10 MG PO TABS
20.0000 mg | ORAL_TABLET | ORAL | Status: DC
  Administered 2017-04-04 – 2017-04-18 (×15): 20 mg via ORAL
  Filled 2017-04-03 (×15): qty 2

## 2017-04-03 MED ORDER — TRAMADOL HCL 50 MG PO TABS
ORAL_TABLET | ORAL | Status: AC
  Administered 2017-04-03: 50 mg via ORAL
  Filled 2017-04-03: qty 1

## 2017-04-03 NOTE — ED Notes (Signed)
BEHAVIORAL HEALTH ROUNDING Patient sleeping: No. Patient alert and oriented: yes Behavior appropriate: Yes.  ; If no, describe:  Nutrition and fluids offered: yes Toileting and hygiene offered: Yes  Sitter present: q15 minute observations and security  monitoring Law enforcement present: Yes  ODS  

## 2017-04-03 NOTE — Clinical Social Work Note (Signed)
CSW received call back from patient's young adult son, Earl LitesGregory, from [place] stating he has tried calling the family and no one is answering their phones. Earl LitesGregory stated he will continue to try. He is aware that CSW will make a DSS APS report. [place] SpanielMonica Gwen Sarvis MSW,LCSW [phone]

## 2017-04-03 NOTE — Clinical Social Work Note (Signed)
CSW spoke with patient's son, Earl LitesGregory, [phone] who lives in [place], [place] and goes to school there. CSW explained the situation that local family has deserted patient and that the police went out to the daughter's home and no one came to the door but the tv was on. CSW spoke with Earl LitesGregory about the need for him to be involved with his mother's care and that he was the only one left at this time. Earl LitesGregory stated that he did not realize how bad his mother was getting and that a while ago, she put his little brother on a train to come live with him and he didn't even know he was coming. Earl LitesGregory stated that he would make a few phone calls to see what is going on and why no family is answering phones or taking care of his mother. He stated he would call me back. CSW informed him that if no one is able to come get patient that CSW would have to make a DSS APS report for abandonment. Earl LitesGregory verbalized understanding. York SpanielMonica Aadyn Buchheit MSW,LCSW [phone]
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
## 2017-04-03 NOTE — ED Notes (Signed)
Pt escorted to bathroom by wheelchair with this tech. Pt bed linens were wet when pt stood. Pt ask to start letting us know when she has to use the bathroom and to not wait until her bed is wet. Linens changed and pt returned to bed.

## 2017-04-03 NOTE — ED Notes (Signed)
Patient observed lying in bed with eyes closed  She is in a hallway bed   Even, unlabored respirations observed   NAD  pt appears to be sleeping  I will continue to monitor along with every 15 minute visual observations and ongoing security monitoring
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
## 2017-04-03 NOTE — ED Notes (Signed)
Rexene AgentShirley Wilson [phone]

## 2017-04-03 NOTE — ED Notes (Signed)
Pt given lunch tray.

## 2017-04-03 NOTE — ED Notes (Signed)
Pt  Vol  PENDING  PLACEMENT

## 2017-04-03 NOTE — ED Notes (Signed)
BEHAVIORAL HEALTH ROUNDING Patient sleeping: Yes.   Patient alert and oriented: eyes closed  Appears to be asleep Behavior appropriate: Yes.  ; If no, describe:  Nutrition and fluids offered: Yes  Toileting and hygiene offered: sleeping Sitter present: q 15 minute observations and security monitoring Law enforcement present: yes  ODS 

## 2017-04-03 NOTE — ED Notes (Addendum)
Pt's IVC was rescinded on 04/02/2017 at 1653 by Clapacs MD  She is pending a safe way to get home and/or a safe place to discharge   CSW consult is ongoing   Pt requires stand by assistance with ambulation and her cane - cane stored at desk

## 2017-04-03 NOTE — ED Notes (Signed)
This tech transported pt to bathroom by wheelchair. Pt scrubs and linens wet. Pt was changed into clean scrubs and diaper and wheeled back to bed where linens were changed and pads placed on bed. Pt laid down and this tech covered pt with two warm blankets. Pt is resting at this time.

## 2017-04-03 NOTE — ED Notes (Signed)

## 2017-04-03 NOTE — Clinical Social Work Note (Signed)
CSW spoke with patient this morning who is still IVC'd even though psychiatry stated patient's IVC needed to be discontinued late yesterday. CSW brought this to the patient's nurse's attention, Amy T this morning. CSW was consulted due to patient's family not being able to be reached.   CSW met with patient who was very childlike in her behavior and responses but was very pleasant and attempted to answer questions as best as she could. Patient informed CSW that she has been living with her daughter, [name] [name], at [address] in [place], Patient states her daughter does not have a phone. CSW informed patient (who has been medically cleared) that the police would be called to go to the address she gave to attempt to locate her daughter.   CSW reviewed previous ED visit information and she was placed from the ED during one visit at [facility], owner is [name] [name]: [phone]. CSW contacted [name] [name] to find out what had happened and why patient left. [name] [name] stated there were no behavioral issues, but that patient's medicaid was terminated and she no longer had a payor source and so she had to discharge patient. She stated she called patient's son: [name] [name]: [phone] (a college student) and he had his mother picked up by other family members.[name] [name] also stated patient was to have a court hearing for medicaid but stated she probably never went.   CSW has left a message for patient's son. CSW has also called the police and are having them go by patient's daughter's home.    [name] [name] MSW,LcSW [phone]

## 2017-04-03 NOTE — ED Notes (Signed)
Pt given a sandwich tray and sprite.  Pt sitting on side of bed eating.

## 2017-04-03 NOTE — ED Notes (Signed)
ED  Is the patient under IVC or is there intent for IVC: Yes.   Is the patient medically cleared: Yes.   Is there vacancy in the ED BHU: Yes.   Is the population mix appropriate for patient: she ambulates with a cane  Is the patient awaiting placement in inpatient or outpatient setting: awaiting social work consult Has the patient had a psychiatric consult:   Awaiting social work consult  Survey of unit performed for contraband, proper placement and condition of furniture, tampering with fixtures in bathroom, shower, and each patient room: Yes.  ; Findings:  APPEARANCE/BEHAVIOR Calm and cooperative NEURO ASSESSMENT Orientation: oriented x3  Hallucinations: No.None noted (Hallucinations) denies  Speech: Normal Gait: normal - unsteady - weakness  RA  She uses a cane RESPIRATORY ASSESSMENT Even  Unlabored respirations  CARDIOVASCULAR ASSESSMENT Pulses equal   regular rate  Skin warm and dry   GASTROINTESTINAL ASSESSMENT no GI complaint EXTREMITIES Full ROM  PLAN OF CARE Provide calm/safe environment. Vital signs assessed twice daily. ED BHU Assessment once each 12-hour shift. Collaborate with TTS when available or as condition indicates. Assure the ED provider has rounded once each shift. Provide and encourage hygiene. Provide redirection as needed. Assess for escalating behavior; address immediately and inform ED provider.  Assess family dynamic and appropriateness for visitation as needed: Yes.  ; If necessary, describe findings:  Educate the patient/family about BHU procedures/visitation: Yes.  ; If necessary, describe findings:
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
## 2017-04-04 LAB — GLUCOSE, CAPILLARY
GLUCOSE-CAPILLARY: 236 mg/dL — AB (ref 65–99)
Glucose-Capillary: 173 mg/dL — ABNORMAL HIGH (ref 65–99)

## 2017-04-04 LAB — URINE CULTURE

## 2017-04-04 MED ORDER — INSULIN ASPART 100 UNIT/ML ~~LOC~~ SOLN
0.0000 [IU] | Freq: Every day | SUBCUTANEOUS | Status: DC
  Administered 2017-04-04: 2 [IU] via SUBCUTANEOUS
  Administered 2017-04-06: 3 [IU] via SUBCUTANEOUS
  Administered 2017-04-09: 2 [IU] via SUBCUTANEOUS
  Administered 2017-04-10: 4 [IU] via SUBCUTANEOUS
  Administered 2017-04-13: 3 [IU] via SUBCUTANEOUS
  Administered 2017-04-14 – 2017-04-16 (×3): 2 [IU] via SUBCUTANEOUS
  Administered 2017-04-17: 4 [IU] via SUBCUTANEOUS
  Filled 2017-04-04 (×8): qty 1

## 2017-04-04 MED ORDER — INSULIN ASPART 100 UNIT/ML ~~LOC~~ SOLN
0.0000 [IU] | Freq: Three times a day (TID) | SUBCUTANEOUS | Status: DC
  Administered 2017-04-05 (×2): 3 [IU] via SUBCUTANEOUS
  Administered 2017-04-05: 4 [IU] via SUBCUTANEOUS
  Administered 2017-04-06: 3 [IU] via SUBCUTANEOUS
  Administered 2017-04-06: 7 [IU] via SUBCUTANEOUS
  Administered 2017-04-07: 3 [IU] via SUBCUTANEOUS
  Administered 2017-04-07: 4 [IU] via SUBCUTANEOUS
  Administered 2017-04-07 – 2017-04-08 (×2): 3 [IU] via SUBCUTANEOUS
  Administered 2017-04-08: 7 [IU] via SUBCUTANEOUS
  Administered 2017-04-08 – 2017-04-09 (×2): 3 [IU] via SUBCUTANEOUS
  Administered 2017-04-09: 4 [IU] via SUBCUTANEOUS
  Administered 2017-04-09: 3 [IU] via SUBCUTANEOUS
  Administered 2017-04-10 – 2017-04-11 (×2): 4 [IU] via SUBCUTANEOUS
  Administered 2017-04-11: 7 [IU] via SUBCUTANEOUS
  Administered 2017-04-12: 11 [IU] via SUBCUTANEOUS
  Administered 2017-04-12: 4 [IU] via SUBCUTANEOUS
  Administered 2017-04-13: 3 [IU] via SUBCUTANEOUS
  Administered 2017-04-13 – 2017-04-14 (×3): 4 [IU] via SUBCUTANEOUS
  Administered 2017-04-14: 3 [IU] via SUBCUTANEOUS
  Administered 2017-04-15 – 2017-04-16 (×4): 4 [IU] via SUBCUTANEOUS
  Administered 2017-04-17 (×2): 3 [IU] via SUBCUTANEOUS
  Administered 2017-04-17 – 2017-04-18 (×2): 4 [IU] via SUBCUTANEOUS
  Filled 2017-04-04 (×32): qty 1

## 2017-04-04 MED ORDER — ACETAMINOPHEN 500 MG PO TABS
ORAL_TABLET | ORAL | Status: AC
  Administered 2017-04-05: 1000 mg via ORAL
  Filled 2017-04-04: qty 2

## 2017-04-04 NOTE — ED Notes (Addendum)
Pt pivots to WC to go to the BR - toleting to be offered every two hours  Clothing changed - she took a good hot shower  Pt ambulated back to bed with her cane and minimal assistance  Continue to encourage

## 2017-04-04 NOTE — ED Notes (Signed)
Patient observed lying in bed with eyes closed  Even, unlabored respirations observed   NAD pt appears to be sleeping  I will continue to monitor along with every 15 minute visual observations and ongoing security monitoring    

## 2017-04-04 NOTE — ED Notes (Signed)
BEHAVIORAL HEALTH ROUNDING Patient sleeping: No. Patient alert and oriented: yes Behavior appropriate: Yes.  ; If no, describe:  Nutrition and fluids offered: yes Toileting and hygiene offered: Yes  Sitter present: q15 minute observations and security  monitoring Law enforcement present: Yes  ODS  

## 2017-04-04 NOTE — ED Notes (Signed)
Patient is resting comfortably at this time with no signs of distress present. Equal and unlabored respirations noted. Will continue to monitor.   

## 2017-04-04 NOTE — ED Notes (Signed)
Pt assisted to bathroom to urinate using a cane on the R side and assistance from me on the L side. Pt was unable to urinate.

## 2017-04-04 NOTE — ED Notes (Signed)
She ambulated to and from the BR with her can and one person assistance  Continue to monitor

## 2017-04-04 NOTE — ED Notes (Addendum)
She did not allow her fingerstick this am or at lunch  - states  "I hurt too bad and that will hurt more"

## 2017-04-04 NOTE — ED Notes (Addendum)
Offered to assistance pt to bathroom to urinate, pt declined at this time.

## 2017-04-04 NOTE — ED Notes (Signed)
Pt given lunch tray and a juice.  

## 2017-04-04 NOTE — ED Notes (Signed)
ED  Is the patient under IVC or is there intent for IVC:  no Is the patient medically cleared: Yes.   Is there vacancy in the ED BHU: Yes.   Is the population mix appropriate for patient: pt requires assistance with ambulation  Is the patient awaiting placement in inpatient or outpatient setting: Yes.  Placement?  Social work consult is ongoing Has the patient had a psychiatric consult: Yes.   Survey of unit performed for contraband, proper placement and condition of furniture, tampering with fixtures in bathroom, shower, and each patient room: Yes.  ; Findings:  APPEARANCE/BEHAVIOR Calm and cooperative NEURO ASSESSMENT Orientation: oriented x3  Bilateral leg pain RA Hallucinations: No.None noted (Hallucinations) Speech: Normal Gait: unsteady / stiff cane provided with ambulation  RESPIRATORY ASSESSMENT Even  Unlabored respirations  CARDIOVASCULAR ASSESSMENT Pulses equal   regular rate  Skin warm and dry   GASTROINTESTINAL ASSESSMENT no GI complaint EXTREMITIES Full ROM  PLAN OF CARE Provide calm/safe environment. Vital signs assessed twice daily. ED BHU Assessment once each 12-hour shift. Collaborate with TTS when available or as condition indicates. Assure the ED provider has rounded once each shift. Provide and encourage hygiene. Provide redirection as needed. Assess for escalating behavior; address immediately and inform ED provider.  Assess family dynamic and appropriateness for visitation as needed: Yes.  ; If necessary, describe findings:  Educate the patient/family about BHU procedures/visitation: Yes.  ; If necessary, describe findings:

## 2017-04-04 NOTE — ED Provider Notes (Signed)
-----------------------------------------   7:09 AM on 04/04/2017 -----------------------------------------   Blood pressure (!) 162/100, pulse 79, temperature 98.2 F (36.8 C), temperature source Oral, resp. rate 16, height 5\' 8"  (1.727 m), weight 84.4 kg (186 lb), SpO2 100 %.  The patient had no acute events since last update.  The patient had no acute events overnight.  Dr. Toni Amendlapacs does not feel that the patient meets inpatient psychiatric criteria.  The patient will need social work evaluation.     Rebecka ApleyWebster, Hajira Verhagen P, MD 04/04/17 [phone]

## 2017-04-04 NOTE — ED Notes (Signed)
Patient cleaned up and changed into new purple hospital scrubs. Bed scrubbed with alcohol wipes, linen changed, and patient given new warm blankets by this EDT and Mayra EDT.

## 2017-04-04 NOTE — ED Notes (Signed)

## 2017-04-04 NOTE — ED Notes (Signed)
Offered to assist pt to bathroom, pt declined having to urinate at this time. Will offer again within an hour.

## 2017-04-04 NOTE — Progress Notes (Signed)
Inpatient Diabetes Program Recommendations  AACE/ADA: New Consensus Statement on Inpatient Glycemic Control (2015)  Target Ranges:  Prepandial:   less than 140 mg/dL      Peak postprandial:   less than 180 mg/dL (1-2 hours)      Critically ill patients:  140 - 180 mg/dL   Results for Jerilee HohMCNATT, Yamil R (MRN [id number]) as of 04/04/2017 11:37  Ref. Range 04/03/2017 22:40  Glucose-Capillary Latest Ref Range: 65 - 99 mg/dL 409266 (H)  Results for Jerilee HohMCNATT, Delano R (MRN [id number]) as of 04/04/2017 11:37  Ref. Range 04/02/2017 13:15  Glucose Latest Ref Range: 65 - 99 mg/dL 956119 (H)   Review of Glycemic Control  Diabetes history: DM2 Outpatient Diabetes medications: Per home med list, has Lantus 65 units QHS (not taking), Novolog 0-8 units (not taking), Glyburdie 10 mg QAM (not taking), Glumetza 500 mg QAM Current orders for Inpatient glycemic control: Metformin XR 500 mg QAM  Inpatient Diabetes Program Recommendations: Correction (SSI): While in the Emergency Room, please consider using Glycemic Control order set to order CBGs with Novolog 0-9 units TID with meals and Novolog 0-5 units QHS.  Thanks, Orlando PennerMarie Nero Sawatzky, RN, MSN, CDE Diabetes Coordinator Inpatient Diabetes Program [phone] (Team Pager from 8am to 5pm)
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
## 2017-04-04 NOTE — ED Notes (Signed)
VOL/Pending placement 
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
## 2017-04-05 LAB — GLUCOSE, CAPILLARY
GLUCOSE-CAPILLARY: 144 mg/dL — AB (ref 65–99)
GLUCOSE-CAPILLARY: 144 mg/dL — AB (ref 65–99)
GLUCOSE-CAPILLARY: 151 mg/dL — AB (ref 65–99)
Glucose-Capillary: 159 mg/dL — ABNORMAL HIGH (ref 65–99)

## 2017-04-05 MED ORDER — DIAZEPAM 5 MG PO TABS
ORAL_TABLET | ORAL | Status: AC
  Filled 2017-04-05: qty 1

## 2017-04-05 MED ORDER — DIAZEPAM 5 MG PO TABS
5.0000 mg | ORAL_TABLET | Freq: Once | ORAL | Status: AC
  Administered 2017-04-05: 5 mg via ORAL

## 2017-04-05 MED ORDER — ACETAMINOPHEN 500 MG PO TABS
1000.0000 mg | ORAL_TABLET | Freq: Once | ORAL | Status: AC
  Administered 2017-04-05: 1000 mg via ORAL

## 2017-04-05 NOTE — ED Notes (Signed)
Breakfast tray given to pt 

## 2017-04-05 NOTE — ED Notes (Signed)
Pt taken to shower in wheelchair and took shower in wheelchair. Linens changed and floor cleaned while pt in shower. Bedside toilet placed in pt room and pt aware. Urine was all under the pt bed. Recliner cleaned and sheet and chux placed on recliner. Pt is sitting in bed at this time.

## 2017-04-05 NOTE — ED Notes (Signed)
Pt given supper tray.

## 2017-04-05 NOTE — ED Provider Notes (Signed)
-----------------------------------------   6:21 AM on 04/05/2017 -----------------------------------------   Blood pressure (!) 155/105, pulse 82, temperature 98 F (36.7 C), resp. rate 18, height 5\' 8"  (1.727 m), weight 84.4 kg (186 lb), SpO2 98 %.  The patient had no acute events since last update.  Analgesics ordered for her chronic knee pain.  Calm and cooperative at this time.  Disposition is pending Psychiatry/Behavioral Medicine team recommendations.     Irean HongSung, Jade J, MD 04/05/17 [phone]

## 2017-04-05 NOTE — ED Notes (Signed)
Pt provided graham crackers, peanut butter and sprite

## 2017-04-05 NOTE — ED Notes (Signed)
Pt given lunch tray. CBG performed

## 2017-04-05 NOTE — ED Notes (Addendum)
Pt still moaning loudly d/t leg pain. Dr Dolores FrameSung to be made aware.

## 2017-04-05 NOTE — ED Notes (Signed)
This tech ask patient if she was wet. Pt stated "no, I promise". I told pt she has not been to the bathroom and I needed to check her. Pt was getting upset when ask to stand so I could check and stated that she promise she was not wet. I informed Bill, RN of what was said and pt was left alone.

## 2017-04-05 NOTE — Clinical Social Work Note (Addendum)
DSS APS caseworker: [name] EssexResha [name] called CSW this morning to get the specifics. She will be coming by to see patient today.   5:24 p.m: CSW had left [name] with DSS APS a message for an update but she did not call CSW back at this time. CSW also received a call from patient's [employer] coordinator: [name] [name] at: [phone] called and left voicemail regarding patient and seeing if she was still in the ED. CSW returned call but also had to leave a message with an update of DSS APS involvement. York SpanielMonica Hildreth [name] MSW,LCSW [phone]

## 2017-04-05 NOTE — ED Notes (Signed)
CBG performed. 159

## 2017-04-05 NOTE — ED Notes (Signed)
Pt sitting in recliner. CBG performed and RN notified.

## 2017-04-06 LAB — GLUCOSE, CAPILLARY
GLUCOSE-CAPILLARY: 202 mg/dL — AB (ref 65–99)
Glucose-Capillary: 133 mg/dL — ABNORMAL HIGH (ref 65–99)
Glucose-Capillary: 257 mg/dL — ABNORMAL HIGH (ref 65–99)

## 2017-04-06 MED ORDER — DICLOFENAC SODIUM 1 % TD GEL
2.0000 g | Freq: Four times a day (QID) | TRANSDERMAL | Status: DC
  Administered 2017-04-06 – 2017-04-18 (×47): 2 g via TOPICAL
  Filled 2017-04-06 (×3): qty 100

## 2017-04-06 NOTE — ED Notes (Signed)
Pt was placed in shower by EDT and this RN. Bed linens were changed.

## 2017-04-06 NOTE — ED Notes (Signed)
Pt placed in bed with feet elevated. Warm blankets given to pt. Lights out. TV on per request.

## 2017-04-06 NOTE — ED Notes (Addendum)
Pt moved from stretcher to recliner pt brief is dry at time; pt given 3 blankets and legs put up in recliner

## 2017-04-06 NOTE — Clinical Social Work Note (Signed)
CSW received a phone call from APS worker Caleen EssexResha Carr [phone] asking for an update on patient, CSW gave APS worker contact information for CSW covering patient today.  Ervin KnackEric R. Abia Monaco, MSW, Theresia MajorsLCSWA [phone]  04/06/2017 10:51 AM

## 2017-04-06 NOTE — ED Notes (Signed)
POC/CBG completed for EMR clean up

## 2017-04-06 NOTE — ED Notes (Signed)
Pt Voluntary pending placement. 

## 2017-04-06 NOTE — ED Notes (Signed)
Pt continues to cry out intermittently with pain.

## 2017-04-06 NOTE — ED Provider Notes (Signed)
-----------------------------------------   5:30 AM on 04/06/2017 -----------------------------------------   BP (!) 136/100 (BP Location: Right Arm)   Pulse 80   Temp 98.4 F (36.9 C) (Oral)   Resp 16   Ht 5\' 8"  (1.727 m)   Wt 84.4 kg (186 lb)   SpO2 100%   BMI 28.28 kg/m   No acute events since last update. Disposition is pending per Psychiatry/Behavioral Medicine team recommendations.     Phineas SemenGoodman, Allanna Bresee, MD 04/06/17 0530

## 2017-04-06 NOTE — ED Notes (Signed)
Gave patient turkey tray with diet sprite.AS 

## 2017-04-07 LAB — GLUCOSE, CAPILLARY
GLUCOSE-CAPILLARY: 123 mg/dL — AB (ref 65–99)
GLUCOSE-CAPILLARY: 129 mg/dL — AB (ref 65–99)
Glucose-Capillary: 154 mg/dL — ABNORMAL HIGH (ref 65–99)
Glucose-Capillary: 177 mg/dL — ABNORMAL HIGH (ref 65–99)

## 2017-04-07 MED ORDER — ACETAMINOPHEN 500 MG PO TABS
1000.0000 mg | ORAL_TABLET | Freq: Once | ORAL | Status: AC
  Administered 2017-04-07: 1000 mg via ORAL
  Filled 2017-04-07: qty 2

## 2017-04-07 NOTE — ED Notes (Signed)
Pt assisted with cleaning herself with warm bath wipes. Pt refused being changed into new scrubs and denies need to be helped to the bathroom at this time.

## 2017-04-07 NOTE — ED Notes (Signed)
voluntary 

## 2017-04-07 NOTE — ED Provider Notes (Signed)
-----------------------------------------   6:37 AM on 04/07/2017 -----------------------------------------   Blood pressure (!) 142/97, pulse 85, temperature 98.3 F (36.8 C), temperature source Oral, resp. rate 16, height 5\' 8"  (1.727 m), weight 84.4 kg (186 lb), SpO2 100 %.  The patient had no acute events since last update.  Calm and cooperative at this time.  Disposition is pending Psychiatry/Behavioral Medicine team recommendations.     Irean HongSung, Jade J, MD 04/07/17 [phone]

## 2017-04-07 NOTE — ED Notes (Signed)
Pt given lunch tray.

## 2017-04-07 NOTE — ED Notes (Signed)
Pt c/o knee pain. MD notified. Tylenol ordered

## 2017-04-08 LAB — GLUCOSE, CAPILLARY
GLUCOSE-CAPILLARY: 145 mg/dL — AB (ref 65–99)
GLUCOSE-CAPILLARY: 121 mg/dL — AB (ref 65–99)
GLUCOSE-CAPILLARY: 199 mg/dL — AB (ref 65–99)
Glucose-Capillary: 203 mg/dL — ABNORMAL HIGH (ref 65–99)

## 2017-04-08 NOTE — ED Notes (Signed)
Assisted patient to toilet using wheelchair.AS

## 2017-04-08 NOTE — ED Notes (Signed)
Patient voluntary and is pending placement. 

## 2017-04-08 NOTE — ED Notes (Signed)
NAD noted at this time. Pt sitting in chair eating dinner. Denies any further needs. Will continue to monitor for further patient needs.

## 2017-04-08 NOTE — ED Notes (Signed)
ENVIRONMENTAL ASSESSMENT  Potentially harmful objects out of patient reach: Yes.  Personal belongings secured: Yes.  Patient dressed in hospital provided attire only: Yes.  Plastic bags out of patient reach: Yes.  Patient care equipment (cords, cables, call bells, lines, and drains) shortened, removed, or accounted for: Yes.  Equipment and supplies removed from bottom of stretcher: Yes.  Potentially toxic materials out of patient reach: Yes.  Sharps container removed or out of patient reach: Yes.   BEHAVIORAL HEALTH ROUNDING  Patient sleeping: No.  Patient alert and oriented: yes  Behavior appropriate: Yes. ; If no, describe:  Nutrition and fluids offered: Yes  Toileting and hygiene offered: Yes  Sitter present: not applicable, Q 15 min safety rounds and observation.  Law enforcement present: Yes ODS  ED BHU PLACEMENT JUSTIFICATION  Is the patient under IVC or is there intent for IVC: no.  Is the patient medically cleared: Yes.  Is there vacancy in the ED BHU: Yes.  Is the population mix appropriate for patient: no.  Is the patient awaiting placement in inpatient or outpatient setting: Yes.  Has the patient had a psychiatric consult: Yes.  Survey of unit performed for contraband, proper placement and condition of furniture, tampering with fixtures in bathroom, shower, and each patient room: Yes. ; Findings: All clear  APPEARANCE/BEHAVIOR  calm, cooperative and adequate rapport can be established  NEURO ASSESSMENT  Orientation: time, place and person  Hallucinations: No.None noted (Hallucinations)  Speech: Normal  Gait: unsteady RESPIRATORY ASSESSMENT  WNL  CARDIOVASCULAR ASSESSMENT  WNL  GASTROINTESTINAL ASSESSMENT  WNL  EXTREMITIES  WNL  PLAN OF CARE  Provide calm/safe environment. Vital signs assessed TID. ED BHU Assessment once each 12-hour shift. Collaborate with TTS daily or as condition indicates. Assure the ED provider has rounded once each shift. Provide and  encourage hygiene. Provide redirection as needed. Assess for escalating behavior; address immediately and inform ED provider.  Assess family dynamic and appropriateness for visitation as needed: Yes. ; If necessary, describe findings:  Educate the patient/family about BHU procedures/visitation: Yes. ; If necessary, describe findings: Pt is calm and cooperative at this time. Pt understanding and accepting of unit procedures/rules. Will continue to monitor with Q 15 min safety rounds and observation.   Pt resting on stretcher at this time. Calm and cooperative. Denies SI/HI. Denies AVH.

## 2017-04-08 NOTE — ED Notes (Signed)
BEHAVIORAL HEALTH ROUNDING  Patient sleeping: No.  Patient alert and oriented: yes  Behavior appropriate: Yes. ; If no, describe:  Nutrition and fluids offered: Yes  Toileting and hygiene offered: Yes  Sitter present: not applicable, Q 15 min safety rounds and observation.  Law enforcement present: Yes ODS  

## 2017-04-08 NOTE — ED Notes (Signed)
Pt sitting up in bed. CBG checked prior to patient receiving breakfast tray. NAD noted. Will continue to monitor for further patient needs.

## 2017-04-08 NOTE — ED Notes (Signed)
Pt. Yelled out from room.  Pt. States knees and hips hurt.  Pt. Repositioned for better comfort.  Pt. Grateful for assistance.

## 2017-04-08 NOTE — ED Notes (Signed)
Patient made comfortable in recliner and given warm blanket by this EDT.

## 2017-04-08 NOTE — ED Notes (Signed)
Pt assisted to the shower by Ontario[place]ita, NT. Pt taken to [place] shower to use the shower wand due to limited mobility.

## 2017-04-08 NOTE — ED Notes (Signed)
Gave patient Mal[place] tray with diet sprite.AS

## 2017-04-08 NOTE — ED Provider Notes (Signed)
-----------------------------------------   8:17 AM on 04/08/2017 -----------------------------------------   Blood pressure (!) 130/103, pulse 86, temperature 98.2 F (36.8 C), temperature source Oral, resp. rate 18, height 5\' 8"  (1.727 m), weight 84.4 kg (186 lb), SpO2 99 %.  The patient had no acute events since last update.  Calm and cooperative at this time.  Disposition is pending Psychiatry/Behavioral Medicine team recommendations.  Patient still does not meet criteria for inpatient but has no place to go.  We will have social work work on placement for the patient.   Rebecka ApleyWebster, Rilley Poulter P, MD 04/08/17 [phone]

## 2017-04-08 NOTE — ED Notes (Signed)
Lunch tray placed at bedside, this RN asked patient to allow CBG to be collected prior to patient eating. Pt states understanding at this time.

## 2017-04-09 LAB — GLUCOSE, CAPILLARY
GLUCOSE-CAPILLARY: 134 mg/dL — AB (ref 65–99)
Glucose-Capillary: 139 mg/dL — ABNORMAL HIGH (ref 65–99)
Glucose-Capillary: 163 mg/dL — ABNORMAL HIGH (ref 65–99)
Glucose-Capillary: 248 mg/dL — ABNORMAL HIGH (ref 65–99)

## 2017-04-09 NOTE — ED Notes (Signed)
BEHAVIORAL HEALTH ROUNDING Patient sleeping: Yes.   Patient alert and oriented: not applicable SLEEPING Behavior appropriate: Yes.  ; If no, describe: SLEEPING Nutrition and fluids offered: No SLEEPING Toileting and hygiene offered: NoSLEEPING Sitter present: not applicable, Q 15 min safety rounds and observation. Law enforcement present: Yes ODS 

## 2017-04-09 NOTE — ED Notes (Signed)
BEHAVIORAL HEALTH ROUNDING  Patient sleeping: No.  Patient alert and oriented: yes  Behavior appropriate: Yes. ; If no, describe:  Nutrition and fluids offered: Yes  Toileting and hygiene offered: Yes  Sitter present: not applicable, Q 15 min safety rounds and observation.  Law enforcement present: Yes ODS  

## 2017-04-09 NOTE — ED Notes (Signed)
Pt was given a meal tray and a beverage. Pt was also changed by this EDT since she was saturated. Pt now lying in bed with clean linens and a clean set of scrubs.

## 2017-04-09 NOTE — ED Provider Notes (Signed)
-----------------------------------------   7:01 AM on 04/09/2017 -----------------------------------------   Blood pressure (!) 152/107, pulse 87, temperature 98.7 F (37.1 C), temperature source Oral, resp. rate 18, height 1.727 m (5\' 8" ), weight 84.4 kg (186 lb), SpO2 100 %.  The patient had no acute events since last update.  Calm and cooperative at this time.  Placement pending.   Loleta RoseForbach, Eyan Hagood, MD 04/09/17 [phone]

## 2017-04-09 NOTE — ED Notes (Signed)
VOL/Pending Placement 

## 2017-04-09 NOTE — ED Notes (Signed)
Pt's CBG checked, CBG 134. Pt given meal tray after CBG checked. Pt sitting in chair at this time. NAD noted. Will continue to monitor for further patient needs.

## 2017-04-09 NOTE — ED Notes (Signed)
ENVIRONMENTAL ASSESSMENT  Potentially harmful objects out of patient reach: Yes.  Personal belongings secured: Yes.  Patient dressed in hospital provided attire only: Yes.  Plastic bags out of patient reach: Yes.  Patient care equipment (cords, cables, call bells, lines, and drains) shortened, removed, or accounted for: Yes.  Equipment and supplies removed from bottom of stretcher: Yes.  Potentially toxic materials out of patient reach: Yes.  Sharps container removed or out of patient reach: Yes.   BEHAVIORAL HEALTH ROUNDING  Patient sleeping: No.  Patient alert and oriented: yes  Behavior appropriate: Yes. ; If no, describe:  Nutrition and fluids offered: Yes  Toileting and hygiene offered: Yes  Sitter present: not applicable, Q 15 min safety rounds and observation.  Law enforcement present: Yes ODS  ED BHU PLACEMENT JUSTIFICATION  Is the patient under IVC or is there intent for IVC: no.  Is the patient medically cleared: Yes.  Is there vacancy in the ED BHU: Yes.  Is the population mix appropriate for patient: no.  Is the patient awaiting placement in inpatient or outpatient setting: Yes.  Has the patient had a psychiatric consult: Yes.  Survey of unit performed for contraband, proper placement and condition of furniture, tampering with fixtures in bathroom, shower, and each patient room: Yes. ; Findings: All clear  APPEARANCE/BEHAVIOR  calm, cooperative and adequate rapport can be established  NEURO ASSESSMENT  Orientation: time, place and person  Hallucinations: No.None noted (Hallucinations)  Speech: Normal  Gait: unsteady  RESPIRATORY ASSESSMENT  WNL  CARDIOVASCULAR ASSESSMENT  WNL  GASTROINTESTINAL ASSESSMENT  WNL  EXTREMITIES  WNL  PLAN OF CARE  Provide calm/safe environment. Vital signs assessed TID. ED BHU Assessment once each 12-hour shift. Collaborate with TTS daily or as condition indicates. Assure the ED provider has rounded once each shift. Provide and  encourage hygiene. Provide redirection as needed. Assess for escalating behavior; address immediately and inform ED provider.  Assess family dynamic and appropriateness for visitation as needed: Yes. ; If necessary, describe findings:  Educate the patient/family about BHU procedures/visitation: Yes. ; If necessary, describe findings: Pt is calm and cooperative at this time. Pt understanding and accepting of unit procedures/rules. Will continue to monitor with Q 15 min safety rounds and observation.

## 2017-04-09 NOTE — Clinical Social Work Note (Signed)
CSW staffed case with Social Work AD Wandra MannanZack Brooks. CSW and Zack called LaPorsha McCullough-DSS APS Supervisor, who stated she will follow up with APS Social Worker Caleen Essexesha Carr and call back Zack for update. CSW continuing to follow for discharge needs.   Corlis HoveJeneya Zaelyn Barbary, Theresia MajorsLCSWA, [facility] Clinical Social Worker-ED [phone]

## 2017-04-09 NOTE — ED Notes (Signed)
Pt received dinner tray at this time.

## 2017-04-09 NOTE — ED Notes (Signed)
CBG checked, pt then given breakfast tray.

## 2017-04-10 LAB — GLUCOSE, CAPILLARY
GLUCOSE-CAPILLARY: 163 mg/dL — AB (ref 65–99)
Glucose-Capillary: 174 mg/dL — ABNORMAL HIGH (ref 65–99)
Glucose-Capillary: 129 mg/dL — ABNORMAL HIGH (ref 65–99)
Glucose-Capillary: 330 mg/dL — ABNORMAL HIGH (ref 65–99)

## 2017-04-10 NOTE — Clinical Social Work Note (Signed)
CSW spoke with [facility] Care Coordinator [phone]), who stated she just received patient's case and knows little about her. Ms.Janee Mornhompson was able to share that patient has a history of polysubstance use and had a recent diagnosis of schizophrenia-unspecified. Per Ms. Janee Mornhompson, patient was connected with [facility] in [place] in 2015 and was assigned peer support. Per Ms. Janee Mornhompson, patient had therapy services with [facility] in [place] in 2016. CSW and Ms. Janee Mornhompson discussed possible discharge options including Rehab, Assisted Living, Skilled Nursing facilities, and possible payor sources. Unsure of patient's level of ability to complete ADLs independently.   CSW spoke with APS Social Worker Caleen EssexResha Carr [phone]), who provided no additional information and suggested CSW look for placement. CSW continuing to follow for discharge needs.   Corlis HoveJeneya Dannisha Eckmann, LCSWA, LCASA [facility] Social Worker-ED [phone]

## 2017-04-10 NOTE — ED Notes (Signed)
BEHAVIORAL HEALTH ROUNDING Patient sleeping: No. Patient alert and oriented: yes Behavior appropriate: Yes.  ; If no, describe:  Nutrition and fluids offered: yes Toileting and hygiene offered: Yes  Sitter present: q15 minute observations and security  monitoring Law enforcement present: Yes  ODS  

## 2017-04-10 NOTE — ED Notes (Addendum)
Pt got up out of chair by her self to get in bed, offered assistance to bathroom and pt declined, Amy,RN aware.

## 2017-04-10 NOTE — ED Notes (Signed)
BEHAVIORAL HEALTH ROUNDING Patient sleeping: Yes.   Patient alert and oriented: not applicable SLEEPING Behavior appropriate: Yes.  ; If no, describe: SLEEPING Nutrition and fluids offered: No SLEEPING Toileting and hygiene offered: NoSLEEPING Sitter present: not applicable, Q 15 min safety rounds and observation. Law enforcement present: Yes ODS 

## 2017-04-10 NOTE — ED Notes (Signed)
BEHAVIORAL HEALTH ROUNDING Patient sleeping: Yes.   Patient alert and oriented: eyes closed  Appears to be asleep Behavior appropriate: Yes.  ; If no, describe:  Nutrition and fluids offered: Yes  Toileting and hygiene offered: sleeping Sitter present: q 15 minute observations and security monitoring Law enforcement present: yes  ODS 

## 2017-04-10 NOTE — ED Notes (Signed)
Lunch tray and juice given to pt 

## 2017-04-10 NOTE — ED Notes (Addendum)
She can be heard - moaning from her room - she is lying in bed - denies a need verbalizes  "My legs hurt - bad this morning"  Assessment completed  Am pain med to be administered  Pt verbalizes she can wait til time to take it  CBG to be completed    Prior to leaving the room  - pt verbalizes  "I want to try to get some more sleep"  Eyes closed  Continue to monitor

## 2017-04-10 NOTE — ED Notes (Signed)
Offered pt assistance to use bathroom, pt refused stating "I do not need to pee."

## 2017-04-10 NOTE — ED Notes (Signed)
BEHAVIORAL HEALTH ROUNDING  Patient sleeping: No.  Patient alert and oriented: yes  Behavior appropriate: Yes. ; If no, describe:  Nutrition and fluids offered: Yes  Toileting and hygiene offered: Yes  Sitter present: not applicable, Q 15 min safety rounds and observation.  Law enforcement present: Yes ODS  

## 2017-04-10 NOTE — ED Notes (Signed)
Patient repositioned on bed for comfort by this EDT. Legs elevated and blankets given. Patient states that she does not have to use the restroom at this time.

## 2017-04-10 NOTE — ED Notes (Signed)
Pt asleep, VS will be taken once pt is awake.

## 2017-04-10 NOTE — ED Notes (Signed)

## 2017-04-10 NOTE — ED Notes (Signed)
CBG blood glucose 174. Amy,RN aware.

## 2017-04-10 NOTE — ED Notes (Signed)
ED Is the patient under IVC or is there intent for IVC:  voluntary Is the patient medically cleared: Yes.   Is there vacancy in the ED BHU: Yes.   Is the population mix appropriate for patient:  Requires assistance with ambulation    Is the patient awaiting placement in inpatient or outpatient setting: Yes.  Placement  Has the patient had a psychiatric consult: Yes.   Survey of unit performed for contraband, proper placement and condition of furniture, tampering with fixtures in bathroom, shower, and each patient room: Yes.  ; Findings:  APPEARANCE/BEHAVIOR Calm and cooperative NEURO ASSESSMENT Orientation: oriented x3   Hallucinations: No.None noted (Hallucinations) denies Speech: Normal Gait: unsteady due to chronic leg/knee pain  Pt requires assistance with walker/cane or WC RESPIRATORY ASSESSMENT Even  Unlabored respirations  CARDIOVASCULAR ASSESSMENT Pulses equal   regular rate  Skin warm and dry   GASTROINTESTINAL ASSESSMENT no GI complaint EXTREMITIES Full ROM  PLAN OF CARE Provide calm/safe environment. Vital signs assessed twice daily. ED BHU Assessment once each 12-hour shift. Collaborate with TTS as condition indicates. Assure the ED provider has rounded once each shift. Provide and encourage hygiene. Provide redirection as needed. Assess for escalating behavior; address immediately and inform ED provider.  Assess family dynamic and appropriateness for visitation as needed: Yes.  ; If necessary, describe findings:  Educate the patient/family about BHU procedures/visitation: Yes.  ; If necessary, describe findings:

## 2017-04-10 NOTE — ED Notes (Signed)
BEHAVIORAL HEALTH ROUNDING Patient sleeping: No. Patient alert and oriented: yes Behavior appropriate: Yes.  ; If no, describe:  Nutrition and fluids offered: yes Toileting and hygiene offered: Yes  Sitter present: q15 minute observations and security monitoring Law enforcement present: Yes  ODS   Am meds administered as ordered  - she is sitting on the side of the bed - pt verbalizes  "Ionly go to the BR with the wheelchair - I am not going to walk - I don't want to try and walk - I must go in the wheelchair"  Verbally encouraged her to stand and pivot to [facility] - she is verbalizing leg discomfort  -

## 2017-04-10 NOTE — ED Notes (Signed)
Patient observed lying in bed with eyes closed  Even, unlabored respirations observed   NAD  continue to monitor  - along with every 15 minute visual observations and ongoing security monitoring    Pt is voluntary

## 2017-04-10 NOTE — ED Notes (Signed)
This EDT assisted pt with a full scrub change since they were wet. Pt able to stand with assistance. Pt now placed in clean dry clothing lying in bed. Pt was given a sandwich tray and a beverage for her snack. Nothing needed from staff at this time

## 2017-04-10 NOTE — ED Notes (Signed)
PT  VOL/  PENDING  PLACEMENT 

## 2017-04-11 LAB — GLUCOSE, CAPILLARY
GLUCOSE-CAPILLARY: 126 mg/dL — AB (ref 65–99)
Glucose-Capillary: 152 mg/dL — ABNORMAL HIGH (ref 65–99)
Glucose-Capillary: 229 mg/dL — ABNORMAL HIGH (ref 65–99)
Glucose-Capillary: 131 mg/dL — ABNORMAL HIGH (ref 65–99)

## 2017-04-11 NOTE — ED Notes (Signed)
BEHAVIORAL HEALTH ROUNDING Patient sleeping: No. Patient alert and oriented: yes Behavior appropriate: Yes.  ; If no, describe:  Nutrition and fluids offered: yes Toileting and hygiene offered: Yes  Sitter present: q15 minute observations and security  monitoring Law enforcement present: Yes  ODS  

## 2017-04-11 NOTE — ED Notes (Signed)
Pt. Alert and oriented.  Pt. Awaiting placement.  Pt. Will contact this nurse for concerns or questions.

## 2017-04-11 NOTE — ED Notes (Signed)
ED  Is the patient under IVC or is there intent for IVC: no voluntary   Is the patient medically cleared: Yes.   Is there vacancy in the ED BHU: Yes.   Is the population mix appropriate for patient: pt requires assistance with ambulation   Is the patient awaiting placement in inpatient or outpatient setting: Yes.   Has the patient had a psychiatric consult: Yes.   Survey of unit performed for contraband, proper placement and condition of furniture, tampering with fixtures in bathroom, shower, and each patient room: Yes.  ; Findings:  APPEARANCE/BEHAVIOR Calm and cooperative NEURO ASSESSMENT Orientation: oriented to self, place and situation  Hallucinations: No.None noted (Hallucinations) denies Speech: Normal Gait: unsteady - shuffled r/t bilateral leg pain  Pt uses the WC often and pivots well  RESPIRATORY ASSESSMENT Even  Unlabored respirations  CARDIOVASCULAR ASSESSMENT Pulses equal   regular rate  Skin warm and dry   GASTROINTESTINAL ASSESSMENT no GI complaint EXTREMITIES Full ROM  PLAN OF CARE Provide calm/safe environment. Vital signs assessed twice daily. ED BHU Assessment once each 12-hour shift. Collaborate with TTS daily or as condition indicates. Assure the ED provider has rounded once each shift. Provide and encourage hygiene. Provide redirection as needed. Assess for escalating behavior; address immediately and inform ED provider.  Assess family dynamic and appropriateness for visitation as needed: Yes.  ; If necessary, describe findings:  Educate the patient/family about BHU procedures/visitation: Yes.  ; If necessary, describe findings:

## 2017-04-11 NOTE — Clinical Social Work Note (Signed)
CSW spoke with APS Social Worker Caleen EssexResha Carr [phone]), who stated patient's old Medicaid worker is Juliette MangleGina Proctor [phone] and she should be able to assist with Medicaid eligibility for patient. [facility], but need payor sources. CSW continuing to follow for discharge needs.   Corlis HoveJeneya Eshal Propps, Theresia MajorsLCSWA, [facility] Clinical Social Worker-ED [phone]

## 2017-04-11 NOTE — ED Notes (Signed)
Pt given supper tray.

## 2017-04-11 NOTE — ED Notes (Signed)
She can be heard - moaning from her room  Pt reports pain in her legs- "right knee especially"  Attempting to rest but moans continue to monitor

## 2017-04-11 NOTE — ED Notes (Signed)
Patient given graham crackers, peanut butter and diet diet ginger ale

## 2017-04-11 NOTE — ED Notes (Signed)
BEHAVIORAL HEALTH ROUNDING Patient sleeping: No. Patient alert and oriented: yes oriented to self, place and situation  Behavior appropriate: Yes.  ; If no, describe:  Nutrition and fluids offered: yes Toileting and hygiene offered: Yes  Sitter present: q15 minute observations and security monitoring Law enforcement present: Yes  ODS  ENVIRONMENTAL ASSESSMENT Potentially harmful objects out of patient reach: Yes.   Personal belongings secured: Yes.   Patient dressed in hospital provided attire only: Yes.   Plastic bags out of patient reach: Yes.   Patient care equipment (cords, cables, call bells, lines, and drains) shortened, removed, or accounted for: Yes.   Equipment and supplies removed from bottom of stretcher: Yes.   Potentially toxic materials out of patient reach: Yes.   Sharps container removed or out of patient reach: Yes.

## 2017-04-11 NOTE — ED Notes (Signed)
Breakfast provided to her room - continues to moan at times r/t her leg pain  Assessment completed  Plan of care discussed  Medication administered as ordered

## 2017-04-11 NOTE — ED Notes (Signed)
Pt up to the bathroom, soiled her clothing. Pt cleaned up and given new scrubs and socks. Pt sitting in her recliner in room.

## 2017-04-11 NOTE — ED Provider Notes (Signed)
Vitals:   04/11/17 0845 04/11/17 0849  BP: (!) 160/109 (!) 160/109  Pulse:  96  Resp:  17  Temp:    SpO2:  98%    I accepted care of this morning at shift change.  No acute events reported to me overnight by nursing staff or physician report.  Patient is voluntary awaiting for disposition at this point.  I reviewed the social work note from yesterday, it looks like there is discussion between rehab, assisted living, and skilled nursing facility.   Governor RooksLord, Marcel Gary, MD 04/11/17 1259

## 2017-04-12 LAB — GLUCOSE, CAPILLARY
GLUCOSE-CAPILLARY: 112 mg/dL — AB (ref 65–99)
GLUCOSE-CAPILLARY: 297 mg/dL — AB (ref 65–99)
Glucose-Capillary: 151 mg/dL — ABNORMAL HIGH (ref 65–99)

## 2017-04-12 NOTE — Progress Notes (Addendum)
LCSW following for disposition/assistance with resources/support  Call placed to [place] who reports that APS currently is only investigating and they have no legal dictation of patient at this time. Investigation can take 30-45 days per report.   medicaid worker Barnett Applebaum has been called and message left. Messages left for both community workers to obtain more information related to guardianship and medicaid status?  LCSW was able to review more notes within [facility] since 2017 and older records dating to early 2000s as well as out of system with [facility] and [facility].  Patient has recently been at [facility] [phone] group home owner for a [place]. Call placed to [facility] to understand timeline between 02/19/17- 04/02/17.  [facility] reports she accepted her to the family care home, however patient lost her medicaid thus she had no way of paying. She reports she lost it because she did not qualify and had no assets.  Patient had minor children in the home thus most likely received medicaid due to that and now children are in college (this is an observation, unclear if this is accurate). Patient dx is Anxiety previously on Wellbutrin XL 151m after reviewing entire chart even though there is documentation of Schizophrenia, however LCSW cannot find this dx within system or out of system records. 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 Patient has history of incontinence of bowel and bladder, however after reviewing notes it is unclear as to why or if this is by choice.  Patient has documented Stroke 2004, but with no deficits.   At this time it is unclear as to why LCSW department is seeking placement outside of the known factor that patient's family will not allow her to return home and she is homeless. She is her own guardian at this time and APS is not pursing  guardianship. Will also discuss with AD of CSW who has been involved as indicated in notes to understand current plans if any.  2:55 PM Discussed with AD barriers to placement including: reason for placement outside of her family not wanting her home and lacking a payor source. AD advises that patient should call family and return home or to a shelter at this time if she is in agreement.  Will discuss with EDP and RN in ED. Met with RN to discuss recommendations and to have patient call her family to come and get her or LCSW has been authorized to send patient home in a taxi. Patient to call family.  LCSW met with patient at bedside. She [place] [address] [place] She reports she lives with her daughter JDelana Meyer LCSW informed that patient will be going back home to this address in which she agrees and voices this is what she wants. RN aware to arrange for Taxi and address has been left for RN. LCSW will call and update APS regarding disposition.   HLane Hacker MSW Clinical Social Work: SPrintmakerCoverage for :  [phone]

## 2017-04-12 NOTE — ED Notes (Signed)
Patient expressed to this EDT that she needed to use the restroom. Patient assisted to bedside commode and changed into cleaned brief. Patient also repositioned for comfort.

## 2017-04-12 NOTE — ED Notes (Signed)
Pt given 1000 meds as she was calling out in pain. Pt advised that we will get her in the shower once the medication has had a chance to be effective. Felicia, NT, will place bedside commode as ambulation is difficult for pt with her painful knees.

## 2017-04-12 NOTE — ED Notes (Addendum)
Pt clean and dry. Pt ate all of dinner tray.

## 2017-04-12 NOTE — ED Notes (Signed)
Patient given dinner tray. Pt urinated on bed and herself. Bed sheets changed, clothing given, pericare provided. Pt able to stand and pivot to bedside commode to urinate again. Encouraged that she must try to urinate before she has episodes of incontinence.

## 2017-04-12 NOTE — ED Notes (Signed)
Pt Voluntary pending placement. 

## 2017-04-12 NOTE — ED Provider Notes (Signed)
-----------------------------------------   3:51 AM on 04/12/2017 -----------------------------------------   Blood pressure 112/84, pulse 78, temperature 98.3 F (36.8 C), temperature source Oral, resp. rate 16, height 5\' 8"  (1.727 m), weight 84.4 kg (186 lb), SpO2 100 %.  The patient had no acute events since last update.  Calm and cooperative at this time.  Disposition is pending Psychiatry/Behavioral Medicine team recommendations.     Merrily Brittleifenbark, Taneeka Curtner, MD 04/12/17 [phone]

## 2017-04-12 NOTE — ED Notes (Signed)
Patient took all medication with no issues.

## 2017-04-12 NOTE — ED Notes (Signed)
Pt given meal tray.

## 2017-04-12 NOTE — ED Notes (Signed)
Dinner tray given to the patient.

## 2017-04-12 NOTE — ED Notes (Signed)
Patient assisted to bedside commode. Patient did not void at this time. Patient also given lemon-lime soda per request by this EDT.

## 2017-04-12 NOTE — ED Notes (Signed)
Report to Zanyla, RN

## 2017-04-13 LAB — GLUCOSE, CAPILLARY
GLUCOSE-CAPILLARY: 166 mg/dL — AB (ref 65–99)
GLUCOSE-CAPILLARY: 133 mg/dL — AB (ref 65–99)
Glucose-Capillary: 177 mg/dL — ABNORMAL HIGH (ref 65–99)
Glucose-Capillary: 283 mg/dL — ABNORMAL HIGH (ref 65–99)

## 2017-04-13 MED ORDER — FOSFOMYCIN TROMETHAMINE 3 G PO PACK
3.0000 g | PACK | ORAL | Status: AC
  Administered 2017-04-13: 3 g via ORAL
  Filled 2017-04-13: qty 3

## 2017-04-13 NOTE — ED Notes (Signed)
Patient up to bathroom with walker. 

## 2017-04-13 NOTE — ED Notes (Signed)
Pt eating breakfast tray at this time. Denies need to urinate.

## 2017-04-13 NOTE — ED Notes (Signed)
This RN called [name] MartyrLawanda [name] per recommendation by APS. States that she will consider coming to assess patient tomorrow. Will call ER in morning to notify. States that she is familiar with patient as she stayed there for approx 9 months.  Officer Jamelle HaringSnow with [employer] asking another [employer] officer to go by house again [address] [place].

## 2017-04-13 NOTE — ED Notes (Signed)
VOL/Pending placement 

## 2017-04-13 NOTE — Progress Notes (Signed)
LCSW following up as plan was for patient to DC home on 04/12/17 however remains within ED. Spoke with AD who continues to support patient going home and this being patient's choice as documented on 04/12/17.  [facility] [place] [facility] Discussed case with worker who reports she is familiar with name and feels they could not locate patient thus that may be the reason her medicaid was shut off. She encourages a family member or APS to reapply for medicaid which can take up to 90 days.  APS contacted and notified of findings and plans for discharge. Will speak with AD of ED to ensure patient can DC today home. Patient can go by [facility] and this has already been authorized.  Will follow up.  [name] EmoryHannah Sharilynn Cassity LCSW, MSW Clinical Social Work: Optician, dispensingystem Wide Float Coverage for :  [phone]

## 2017-04-13 NOTE — ED Notes (Addendum)
Found number listed online for a [name] JubaJasmine [name]; called number listed [phone]-no answer, no voicemail set up.   Pt has made no remarks about wishing to leave at this time. Pt understands that she is voluntary.

## 2017-04-13 NOTE — ED Notes (Signed)
Pt ambulated with walker to bathroom. Pt unsteady on feet, but is able to walk independently to and from with walker.

## 2017-04-13 NOTE — ED Notes (Signed)
Patient refused to use bedside toilet. This Clinical research associatewriter and tech walked patient to bed.

## 2017-04-13 NOTE — ED Notes (Signed)
Pt ate all of lunch tray 

## 2017-04-13 NOTE — ED Notes (Signed)
Called social worker on call for ER, left message to update on daughter's refusal for patient to return to the house.

## 2017-04-13 NOTE — Progress Notes (Addendum)
Inpatient Diabetes Program Recommendations  AACE/ADA: New Consensus Statement on Inpatient Glycemic Control (2015)  Target Ranges:  Prepandial:   less than 140 mg/dL      Peak postprandial:   less than 180 mg/dL (1-2 hours)      Critically ill patients:  140 - 180 mg/dL   Lab Results  Component Value Date   GLUCAP 166 (H) 04/13/2017    Review of Glycemic Control  Results for [name] [name], [name] [name] (MRN [id number]) as of 04/13/2017 11:41  Ref. Range 04/11/2017 21:19 04/12/2017 08:14 04/12/2017 12:38 04/12/2017 17:04 04/13/2017 07:52  Glucose-Capillary Latest Ref Range: 65 - 99 mg/dL 409131 (H) 811151 (H) 914112 (H) 297 (H) 166 (H)    Diabetes history: DM2 Outpatient Diabetes medications: Per home med list, has Lantus 65 units QHS (not taking), Novolog 0-8 units (not taking), Glyburdie 10 mg QAM (not taking), Glumetza 500 mg QAM  Current orders for Inpatient glycemic control: Metformin XR 500 mg QAM, Novolog 0-20 units tid, Novolog 0-5 units qhs  Inpatient Diabetes Program Recommendations: Post prandial blood sugars are elevated when she does not get any correction Novolog insulin.  Consider adding Novolog 2 units tid (hold if the patient eats less than 50%) - Continue Novolog correction as ordered, 0-20 units tid.   Susette RacerJulie Carie Kapuscinski, RN, BA, MHA, CDE Diabetes Coordinator Inpatient Diabetes Program  [phone] (Team Pager) [phone] [facility] Office) 04/13/2017 11:42 AM

## 2017-04-13 NOTE — ED Provider Notes (Signed)
-----------------------------------------   9:15 AM on 04/13/2017 -----------------------------------------   Blood pressure (!) 137/97, pulse 88, temperature 98.2 F (36.8 C), temperature source Oral, resp. rate 18, height 5\' 8"  (1.727 m), weight 84.4 kg (186 lb), SpO2 100 %.  The patient had no acute events since last update.  Calm and cooperative at this time.  Disposition is pending Psychiatry/Behavioral Medicine team recommendations.     Sharyn CreamerQuale, Mark, MD 04/13/17 [phone]

## 2017-04-13 NOTE — ED Notes (Signed)
c-com called by secretary to send police out to address of [address] [place] to see if anyone home. Charge RN informed.

## 2017-04-13 NOTE — ED Notes (Addendum)
Call number listed, Darl PikesSusan answered- states that she knows patient because patient lived at her facility for 24 days. States that she was living with her son, Earl LitesGregory- and gave number. Attempted to call Earl LitesGregory at [phone], no answer-left message.   Pt has made no remarks about wishing to leave at this time. Pt understands that she is voluntary.

## 2017-04-13 NOTE — ED Provider Notes (Signed)
Dr. Toni Amendlapacs, and cleared for discharge.  Stable hemodynamics and no distress.  Social work is seen her multiple times, at this point it appears her disposition will be to contact family and have her return for ongoing care via them and [facility].  Primary care follow-up is advised.  The patient is resting comfortably in no distress, received 1 dose of cephalexin for a possible UTI, I will give her a dose of fosfomycin to assure clearance of her UTI at this time prior to discharging her.  Does not appear to have any ongoing need for inpatient medical or psychiatric admission.  Nurse, Joan FloresAlley, will be contacting family for them to pick her up for discharge.  Sharyn CreamerQuale, Sosie Gato, MD 04/13/17 [phone]

## 2017-04-13 NOTE — ED Notes (Signed)
Pt ate all of breakfast tray. 

## 2017-04-13 NOTE — ED Notes (Addendum)
Charge RN notified of last updated regarding daughter.

## 2017-04-13 NOTE — ED Notes (Signed)
Ate 100% of meal

## 2017-04-13 NOTE — Clinical Social Work Note (Addendum)
CSW contacted by patient's nurse for update. CSW relayed [name] plan as documented by previous CSW, [name] RasmussenHannah Coble LCSW. Patient's nurse documented that [name] plan was not a safe plan. CSW asked by ED staff for contact info for leadership of CSW. CSW contacted  [name] Sherwin-Williamsack Brooks LCSW, and gave him [name] number to call [name] charge nurse as provided by patient's nurse. Informed by [name] [name] after he spoke with charge nurse that plan is to remain [name] same at this time. Zack stated nursing staff was going to send police out to [name] address to see if anyone was there.  [name] [name] MSW,LCSW [phone]

## 2017-04-13 NOTE — ED Notes (Signed)
[employer] officer went to address listed, Jasmine answered door and told officer that she had nothing to do with patient and that she was not to return there. Gave number of cousin, which RN called-no answer and mailbox full [phone]

## 2017-04-13 NOTE — ED Notes (Signed)
Patient laying in bed with eyes closed. Respirations even and unlabored.

## 2017-04-13 NOTE — ED Notes (Signed)
This Clinical research associatewriter and tech assisted patient to bed side toilet.

## 2017-04-13 NOTE — ED Notes (Addendum)
Attempt to call son at [phone], no answer-left message. Pt does not know daughter's number. Darl PikesSusan that is familiar with patient does not know daughter's number either.  Pt has made no remarks about wishing to leave at this time. Pt understands that she is voluntary.

## 2017-04-13 NOTE — ED Notes (Signed)
Called c-com; directed to Social Services line-left message with APS voicemail.

## 2017-04-13 NOTE — ED Notes (Signed)
Patient denies pain and is resting comfortably.  

## 2017-04-13 NOTE — ED Notes (Addendum)
Per [employer] Officer Ashworth who went to address listed [place] ER [place] [address] [place], [place]. Officer reports that no one came to door. [facility], Diplomatic Services operational officerecretary. Will contact APS directly per recommendation of Vernona RiegerLaura, Director of ER.

## 2017-04-13 NOTE — ED Notes (Signed)
[name] [name], from APS called to tell RN that she had gotten in touch with [name] [name] who owns [name], where patient has previously lived. [name] [name] has agreen to come assess patient. [phone]

## 2017-04-13 NOTE — ED Notes (Signed)
Pt given meal tray.

## 2017-04-13 NOTE — ED Notes (Signed)
Pt sitting at bed side and this tech encouraged her to lay down to avoid falling. Pt refused to lay down. This tech offered pt to use commode but refused. Pt offered to sit in recliner and is resting at this time.

## 2017-04-13 NOTE — ED Notes (Addendum)
Spoke with APS supervisor, Bertis RuddyLaporscha McCullough who states that a case has been reported on this patient. She states that she has been in contact with social worker, Maxine GlennMonica and that there are no resources available for this patient at this time. APS reports that they have attempted to call son as well with no success. States that address listed is correct. APS supervisor states that she will call one of her colleagues and return phone call.   Pt has made no remarks about wishing to leave at this time. Pt understands that she is voluntary.

## 2017-04-13 NOTE — ED Notes (Signed)
Patient changed into clean brief and repositioned on the bed for comfort.

## 2017-04-13 NOTE — ED Notes (Signed)
VOL  PENDING  PLACEMENT 

## 2017-04-13 NOTE — ED Notes (Addendum)
Contacted Juliette MangleGina Proctor at [phone], no answer left message. Contacted Caleen Essexesha Carr at [phone], no answer left message.  No further contacts listed. Attempting to contact these individuals to get phone number of daughter Illene LabradorJazmine, whom patient does not know number of.  Attempting to get patient home. Pt reports that she does not know address of anywhere that she might return. This RN feels that this patient cannot be sent to shelter alone, due being unsteady on feet, and needing to be prompted to urinate and defecate on toilet vs being incontinent. This would be an unsafe discharge.  Will continue to attempt to call family.  Pt has made no remarks about wishing to leave at this time. Pt understands that she is voluntary.

## 2017-04-13 NOTE — ED Notes (Signed)
Pt up to bedside commode, was incontinent of urine on standing, small BM done in BS commode, bed and underclothes changed

## 2017-04-13 NOTE — ED Notes (Signed)
Pt brief and linens clean and dry.

## 2017-04-13 NOTE — ED Notes (Signed)
Attempt to call Wandra MannanZack Brooks with Social work-line busy. Will attempt again.

## 2017-04-13 NOTE — ED Notes (Signed)
Discussed with ER doctor about most recent social work note. Orders for discharge received. Will attempt to call son and daugher.

## 2017-04-13 NOTE — ED Notes (Addendum)
[name] [name] called back, states that on call social workers are making phone calls to see if anyone can take this patient tonight. States that they have this RN phone number and will call with updates.  Pt has made no remarks about wishing to leave at this time. Pt understands that she is voluntary.

## 2017-04-13 NOTE — ED Notes (Addendum)
Called social worker to inquire about how to get patient out of ER.   Pt states daughter lives at [address] but daughter does not have phone.    Charge RN aware.   Pt has made no remarks about wishing to leave at this time. Pt understands that she is voluntary.

## 2017-04-14 LAB — GLUCOSE, CAPILLARY
GLUCOSE-CAPILLARY: 136 mg/dL — AB (ref 65–99)
GLUCOSE-CAPILLARY: 159 mg/dL — AB (ref 65–99)
GLUCOSE-CAPILLARY: 247 mg/dL — AB (ref 65–99)
Glucose-Capillary: 117 mg/dL — ABNORMAL HIGH (ref 65–99)

## 2017-04-14 MED ORDER — INSULIN ASPART 100 UNIT/ML ~~LOC~~ SOLN
SUBCUTANEOUS | Status: AC
  Administered 2017-04-13: 3 [IU] via SUBCUTANEOUS
  Filled 2017-04-14: qty 1

## 2017-04-14 NOTE — ED Provider Notes (Signed)
-----------------------------------------   1:48 AM on 04/14/2017 -----------------------------------------   Blood pressure (!) 148/88, pulse 81, temperature 97.9 F (36.6 C), temperature source Oral, resp. rate 18, height 5\' 8"  (1.727 m), weight 84.4 kg (186 lb), SpO2 97 %.  The patient had no acute events since last update.  Calm and cooperative at this time.  The patient has been cleared for discharge by psychiatry, however she is unable to care for herself.  Her daughter is now refusing to have her back home.  Social work is working on placement, however we will contact [facility] now as it appears the patient has been abandoned by her family.   Merrily Brittleifenbark, Corrie Brannen, MD 04/14/17 [phone]

## 2017-04-14 NOTE — ED Notes (Signed)
BEHAVIORAL HEALTH ROUNDING  Patient sleeping: No.  Patient alert and oriented: yes  Behavior appropriate: Yes. ; If no, describe:  Nutrition and fluids offered: Yes  Toileting and hygiene offered: Yes  Sitter present: not applicable, Q 15 min safety rounds and observation.  Law enforcement present: Yes ODS  

## 2017-04-14 NOTE — ED Notes (Signed)
Patient cleaned up and changed into clean brief. Patient changed into new purple scrubs and linen on bed also changed by this EDT. Patient repositioned for comfort.

## 2017-04-14 NOTE — Clinical Social Work Note (Addendum)
CSW spoke with patient's cousin [name] [name], [name] [name]. at [phone] who was adamant that patient could not stay with him. Mr. [name] [name] stated patient was placed in a family care home 2 years ago for a year but was "kicked out" due to defecating on herself, being disrespectful, and smoking in the building. Mr. [name] [name] stated he already told "the state" he can not help, "especially when she is not trying to help herself."   Per RN note family care home to come assess patient. CSW left a voicemail for [name] [name] at [phone]. Awaiting a call back. CSW staffed case with Social Work ChiropodistAssistant Director [employer]PL Groupackary Brooks. CSW continuing to follow for transitions of care/discharge needs.   [name] [name], [name] [name], [facility] Clinical Social Worker-ED [phone]

## 2017-04-14 NOTE — ED Notes (Signed)
SOC called for consult  Just informed of consult pending by charge RN

## 2017-04-14 NOTE — ED Notes (Addendum)
Pt provided graham crackers, peanut butter, and sprite to drink, pt continue to rest quietly in his bed watching tv at this time, this tech will continue to monitor q 15 minutes.

## 2017-04-14 NOTE — ED Notes (Signed)
C Comm called to page on call Adult Protective Services about daughter's apparent abandonment of pt, APS is currently investigating already and is supposed to make contact with pt today

## 2017-04-14 NOTE — ED Notes (Signed)
SOC called for consult  Just informed of consult by charge RN

## 2017-04-14 NOTE — ED Notes (Signed)
ENVIRONMENTAL ASSESSMENT  Potentially harmful objects out of patient reach: Yes.  Personal belongings secured: Yes.  Patient dressed in hospital provided attire only: Yes.  Plastic bags out of patient reach: Yes.  Patient care equipment (cords, cables, call bells, lines, and drains) shortened, removed, or accounted for: Yes.  Equipment and supplies removed from bottom of stretcher: Yes.  Potentially toxic materials out of patient reach: Yes.  Sharps container removed or out of patient reach: Yes.   BEHAVIORAL HEALTH ROUNDING  Patient sleeping: No.  Patient alert and oriented: yes  Behavior appropriate: Yes. ; If no, describe:  Nutrition and fluids offered: Yes  Toileting and hygiene offered: Yes  Sitter present: not applicable, Q 15 min safety rounds and observation.  Law enforcement present: Yes ODS  ED BHU PLACEMENT JUSTIFICATION  Is the patient under IVC or is there intent for IVC: no.  Is the patient medically cleared: Yes.  Is there vacancy in the ED BHU: Yes.  Is the population mix appropriate for patient: no.  Is the patient awaiting placement in inpatient or outpatient setting: Yes.  Has the patient had a psychiatric consult: Yes.  Survey of unit performed for contraband, proper placement and condition of furniture, tampering with fixtures in bathroom, shower, and each patient room: Yes. ; Findings: All clear  APPEARANCE/BEHAVIOR  calm, cooperative and adequate rapport can be established  NEURO ASSESSMENT  Orientation: time, place and person  Hallucinations: No.None noted (Hallucinations)  Speech: Normal  Gait: unsteady  RESPIRATORY ASSESSMENT  WNL  CARDIOVASCULAR ASSESSMENT  WNL  GASTROINTESTINAL ASSESSMENT  WNL  EXTREMITIES  WNL  PLAN OF CARE  Provide calm/safe environment. Vital signs assessed TID. ED BHU Assessment once each 12-hour shift. Collaborate with TTS daily or as condition indicates. Assure the ED provider has rounded once each shift. Provide and  encourage hygiene. Provide redirection as needed. Assess for escalating behavior; address immediately and inform ED provider.  Assess family dynamic and appropriateness for visitation as needed: Yes. ; If necessary, describe findings:  Educate the patient/family about BHU procedures/visitation: Yes. ; If necessary, describe findings: Pt is calm and cooperative at this time. Pt understanding and accepting of unit procedures/rules. Will continue to monitor with Q 15 min safety rounds and observation.      

## 2017-04-14 NOTE — ED Notes (Signed)
BEHAVIORAL HEALTH ROUNDING Patient sleeping: Yes.   Patient alert and oriented: not applicable SLEEPING Behavior appropriate: Yes.  ; If no, describe: SLEEPING Nutrition and fluids offered: No SLEEPING Toileting and hygiene offered: NoSLEEPING Sitter present: not applicable, Q 15 min safety rounds and observation. Law enforcement present: Yes ODS 

## 2017-04-14 NOTE — ED Notes (Signed)
Pt incontinent of urine several times. I saw urine running down her leg, asked her if she needed to go to the bathroom and she said no. Pt has toilet at bedside and does not ask to use it. With prompting,  pt was able to walk to toilet with walker and 2 person assist. Mobility limited due to knee pain. Needs moderate assist to stand. Ate all of meals. C/o chronic knee pain. No other complaints

## 2017-04-15 LAB — GLUCOSE, CAPILLARY
GLUCOSE-CAPILLARY: 175 mg/dL — AB (ref 65–99)
GLUCOSE-CAPILLARY: 116 mg/dL — AB (ref 65–99)
GLUCOSE-CAPILLARY: 190 mg/dL — AB (ref 65–99)
GLUCOSE-CAPILLARY: 214 mg/dL — AB (ref 65–99)

## 2017-04-15 NOTE — ED Notes (Signed)
BEHAVIORAL HEALTH ROUNDING  Patient sleeping: No.  Patient alert and oriented: yes  Behavior appropriate: Yes. ; If no, describe:  Nutrition and fluids offered: Yes  Toileting and hygiene offered: Yes  Sitter present: not applicable, Q 15 min safety rounds and observation.  Law enforcement present: Yes ODS  

## 2017-04-15 NOTE — ED Notes (Signed)
BEHAVIORAL HEALTH ROUNDING Patient sleeping: Yes.   Patient alert and oriented: not applicable SLEEPING Behavior appropriate: Yes.  ; If no, describe: SLEEPING Nutrition and fluids offered: No SLEEPING Toileting and hygiene offered: NoSLEEPING Sitter present: not applicable, Q 15 min safety rounds and observation. Law enforcement present: Yes ODS 

## 2017-04-15 NOTE — ED Notes (Signed)
Vol social work placement  °

## 2017-04-15 NOTE — ED Provider Notes (Signed)
-----------------------------------------   6:06 AM on 04/15/2017 -----------------------------------------   Blood pressure (!) 141/96, pulse 78, temperature 98.2 F (36.8 C), temperature source Oral, resp. rate 18, height 5\' 8"  (1.727 m), weight 84.4 kg (186 lb), SpO2 99 %.  The patient had no acute events since last update.  Calm and cooperative at this time.  Disposition is pending CSW team recommendations.     Irean HongSung, Soriah Leeman J, MD 04/15/17 [phone]

## 2017-04-15 NOTE — ED Notes (Signed)
Patient given meal tray.

## 2017-04-15 NOTE — Clinical Social Work Note (Signed)
CSW spoke with RN Marylene LandAngela about patient's ability to ambulate and her incontinence issues, which per epic notes are documented as far back as 01/2016. CSW staffed case with AD Wandra MannanZack Brooks. Zack to reach out to the medical director on Monday for review of this case. CSW asked Dr. Shaune PollackLord to request a  P/T consult. CSW awaiting P/T evaluation and recommendations. Hopefully this will provide a better picture as to the level of care needed for patient and her abilities. Patient continues to have no payor source, which is definitely a barrier to any kind of placement or alternative living options. CSW continuing to follow for transitions of care/discharge needs.   Corlis HoveJeneya Taylon Coole, Theresia MajorsLCSWA, [facility] Clinical Social Worker-ED [phone]

## 2017-04-15 NOTE — ED Provider Notes (Signed)
As by Child psychotherapistsocial worker to place a PT consult to help with possible placement options.  Patient is generalized weak and has some concern with stability with her gait.  I did place this order.   Governor RooksLord, Luisantonio Adinolfi, MD 04/15/17 [phone]

## 2017-04-16 LAB — GLUCOSE, CAPILLARY
GLUCOSE-CAPILLARY: 193 mg/dL — AB (ref 65–99)
GLUCOSE-CAPILLARY: 117 mg/dL — AB (ref 65–99)
GLUCOSE-CAPILLARY: 222 mg/dL — AB (ref 65–99)
Glucose-Capillary: 157 mg/dL — ABNORMAL HIGH (ref 65–99)

## 2017-04-16 NOTE — ED Notes (Signed)
BEHAVIORAL HEALTH ROUNDING Patient sleeping: No. Patient alert and oriented: yes Behavior appropriate: Yes.  ; If no, describe:  Nutrition and fluids offered: yes Toileting and hygiene offered: Yes  Sitter present: q15 minute observations and security  monitoring Law enforcement present: Yes  ODS  

## 2017-04-16 NOTE — ED Notes (Signed)
Pt observed lying in bed - watching TV   Pt visualized with NAD  No verbalized needs or concerns at this time  Continue to monitor 

## 2017-04-16 NOTE — ED Provider Notes (Signed)
Vitals:   04/15/17 2105 04/16/17 1047  BP: (!) 129/93 (!) 141/100  Pulse: 73 98  Resp: 18 18  Temp: 98.7 F (37.1 C) 98.5 F (36.9 C)  SpO2: 100% 100%       No acute events reported to me overnight from nursing or physician sign out.  Patient is voluntary, awaiting social work Database administratorplacement/disposition plan.    Governor RooksLord, Suda Forbess, MD 04/16/17 [phone]

## 2017-04-16 NOTE — ED Notes (Signed)
BEHAVIORAL HEALTH ROUNDING  Patient sleeping: No.  Patient alert and oriented: yes  Behavior appropriate: Yes. ; If no, describe:  Nutrition and fluids offered: Yes  Toileting and hygiene offered: Yes  Sitter present: not applicable, Q 15 min safety rounds and observation.  Law enforcement present: Yes ODS  

## 2017-04-16 NOTE — ED Notes (Signed)
VOL/Pending placement 

## 2017-04-16 NOTE — ED Notes (Signed)
Pt ambulated to bathroom with walker assistance.

## 2017-04-16 NOTE — ED Notes (Signed)
Hourly rounding reveals patient in room. No complaints, stable, in no acute distress. Q15 minute rounds and monitoring via Security Cameras to continue. 

## 2017-04-16 NOTE — ED Notes (Signed)
BEHAVIORAL HEALTH ROUNDING Patient sleeping: Yes.   Patient alert and oriented: not applicable SLEEPING Behavior appropriate: Yes.  ; If no, describe: SLEEPING Nutrition and fluids offered: No SLEEPING Toileting and hygiene offered: NoSLEEPING Sitter present: not applicable, Q 15 min safety rounds and observation. Law enforcement present: Yes ODS 

## 2017-04-16 NOTE — ED Notes (Addendum)

## 2017-04-16 NOTE — ED Notes (Signed)
Hourly rounding reveals patient sleeping in room. No complaints, stable, in no acute distress. Q15 minute rounds and monitoring via Security Cameras to continue. 

## 2017-04-16 NOTE — ED Notes (Signed)
Report to include Situation, Background, Assessment, and Recommendations received from Allison RN. Patient alert and oriented, warm and dry, in no acute distress. Patient denies SI, HI, AVH and pain. Patient made aware of Q15 minute rounds and security cameras for their safety. Patient instructed to come to me with needs or concerns. 

## 2017-04-16 NOTE — ED Notes (Signed)
Hourly rounding reveals patient in room eating a snack. No complaints, stable, in no acute distress. Q15 minute rounds and monitoring via [employer] Cam[employer] to continue.

## 2017-04-16 NOTE — ED Notes (Signed)
Pt given sandwich tray and diet sprite. °

## 2017-04-16 NOTE — ED Notes (Signed)
ED Is the patient under IVC or is there intent for IVC:  Voluntary  Is the patient medically cleared: Yes.   Is there vacancy in the ED BHU: Yes.   Is the population mix appropriate for patient:  Unsteady gait Is the patient awaiting placement in inpatient or outpatient setting: Yes.   Has the patient had a psychiatric consult: Yes.   Survey of unit performed for contraband, proper placement and condition of furniture, tampering with fixtures in bathroom, shower, and each patient room: Yes.  ; Findings:  APPEARANCE/BEHAVIOR Calm and cooperative NEURO ASSESSMENT Orientation: oriented x3   Hallucinations: No.None noted (Hallucinations) Speech: Normal Gait:  Unsteady  RESPIRATORY ASSESSMENT Even  Unlabored respirations  CARDIOVASCULAR ASSESSMENT Pulses equal   regular rate  Skin warm and dry   GASTROINTESTINAL ASSESSMENT no GI complaint EXTREMITIES Full ROM  PLAN OF CARE Provide calm/safe environment. Vital signs assessed twice daily. ED BHU Assessment once each 12-hour shift. Collaborate with TTS when available or as condition indicates. Assure the ED provider has rounded once each shift. Provide and encourage hygiene. Provide redirection as needed. Assess for escalating behavior; address immediately and inform ED provider.  Assess family dynamic and appropriateness for visitation as needed: Yes.  ; If necessary, describe findings:  Educate the patient/family about BHU procedures/visitation: Yes.  ; If necessary, describe findings:

## 2017-04-16 NOTE — ED Notes (Signed)
ENVIRONMENTAL ASSESSMENT  Potentially harmful objects out of patient reach: Yes.  Personal belongings secured: Yes.  Patient dressed in hospital provided attire only: Yes.  Plastic bags out of patient reach: Yes.  Patient care equipment (cords, cables, call bells, lines, and drains) shortened, removed, or accounted for: Yes.  Equipment and supplies removed from bottom of stretcher: Yes.  Potentially toxic materials out of patient reach: Yes.  Sharps container removed or out of patient reach: Yes.   BEHAVIORAL HEALTH ROUNDING  Patient sleeping: No.  Patient alert and oriented: yes  Behavior appropriate: Yes. ; If no, describe:  Nutrition and fluids offered: Yes  Toileting and hygiene offered: Yes  Sitter present: not applicable, Q 15 min safety rounds and observation.  Law enforcement present: Yes ODS  ED BHU PLACEMENT JUSTIFICATION  Is the patient under IVC or is there intent for IVC: no.  Is the patient medically cleared: Yes.  Is there vacancy in the ED BHU: Yes.  Is the population mix appropriate for patient: no.  Is the patient awaiting placement in inpatient or outpatient setting: Yes.  Has the patient had a psychiatric consult: Yes.  Survey of unit performed for contraband, proper placement and condition of furniture, tampering with fixtures in bathroom, shower, and each patient room: Yes. ; Findings: All clear  APPEARANCE/BEHAVIOR  calm, cooperative and adequate rapport can be established  NEURO ASSESSMENT  Orientation: time, place and person  Hallucinations: No.None noted (Hallucinations)  Speech: Normal  Gait: unsteady RESPIRATORY ASSESSMENT  WNL  CARDIOVASCULAR ASSESSMENT  WNL  GASTROINTESTINAL ASSESSMENT  WNL  EXTREMITIES  WNL  PLAN OF CARE  Provide calm/safe environment. Vital signs assessed TID. ED BHU Assessment once each 12-hour shift. Collaborate with TTS/SW daily or as condition indicates. Assure the ED provider has rounded once each shift. Provide and  encourage hygiene. Provide redirection as needed. Assess for escalating behavior; address immediately and inform ED provider.  Assess family dynamic and appropriateness for visitation as needed: Yes. ; If necessary, describe findings:  Educate the patient/family about BHU procedures/visitation: Yes. ; If necessary, describe findings: Pt is calm and cooperative at this time. Pt understanding and accepting of unit procedures/rules. Will continue to monitor with Q 15 min safety rounds and observation.

## 2017-04-16 NOTE — Evaluation (Signed)
Physical Therapy Evaluation Patient Details Name: VENIE MONTESINOS MRN: [id number] DOB: [birth year] Today's Date: 04/16/2017   History of Present Illness  Pt is a 52 y.o. female presenting to hospital with medication noncompliance and behavioral issues.  PMH includes schizophrenia, anxiety, DM, stroke x2, and htn.  Clinical Impression  Prior to hospital admission, pt reports ambulating shorter distances with RW (limited d/t B knee pain).  Pt appears to most recently have been living with her daughter in 2 level home (level entry; sleeps main level; bathroom 2nd floor).  Initially pt appearing "stiff" in general requiring min assist with standing and walking 20 feet with RW; after short sitting rest break pt then CGA with standing and walking 20 feet with RW (pt noted to move better 2nd mobility trial).  B knee flexed (pt noted with B knee flexion contractures) and significant forward flexed posture noted in standing (using RW) with ambulation; pt reports this is her baseline.  Pt would benefit from skilled PT to address noted impairments and functional limitations (see below for any additional details).  Upon hospital discharge, recommend pt discharge with 24/7 assist and HHPT; pt reports having a RW but would benefit from [facility] (so pt would not have to navigate stairs to upstairs bathroom).    Follow Up Recommendations Home health PT;Supervision/Assistance - 24 hour    Equipment Recommendations  Rolling walker with 5" wheels;3in1 (PT)    Recommendations for Other Services       Precautions / Restrictions Precautions Precautions: Fall Restrictions Weight Bearing Restrictions: No      Mobility  Bed Mobility Overal bed mobility: Needs Assistance Bed Mobility: Sit to Supine       Sit to supine: Min assist   General bed mobility comments: assist for B LE's (limited d/t B knee pain)  Transfers Overall transfer level: Needs assistance Equipment used: Rolling walker (2  wheeled) Transfers: Sit to/from Stand Sit to Stand: Min assist         General transfer comment: 1st trial min assist to stand; 2nd trial CGA (increased effort and time for pt to perform on own)  Ambulation/Gait Ambulation/Gait assistance: Min guard;+2 safety/equipment Ambulation Distance (Feet): (20 feet x2) Assistive device: Rolling walker (2 wheeled) Gait Pattern/deviations: Step-to pattern;Antalgic Gait velocity: decreased   General Gait Details: forward flexed posture; decreased B step length; steady with RW  Stairs            Wheelchair Mobility    Modified Rankin (Stroke Patients Only)       Balance Overall balance assessment: Needs assistance Sitting-balance support: No upper extremity supported;Feet supported Sitting balance-Leahy Scale: Good Sitting balance - Comments: steady sitting reaching within BOS   Standing balance support: Bilateral upper extremity supported Standing balance-Leahy Scale: Poor Standing balance comment: requires B UE support on RW for static standing balance; forward flexed posture                             Pertinent Vitals/Pain Pain Assessment: 0-10 Pain Score: 8  Pain Location: B knees; L side of abdomen Pain Descriptors / Indicators: Aching Pain Intervention(s): Limited activity within patient's tolerance;Monitored during session;Repositioned;Premedicated before session;Relaxation  Vitals (HR and O2 on room air) stable and WFL throughout treatment session.    Home Living Family/patient expects to be discharged to:: Private residence Living Arrangements: Children(most recently living with her daughter)   Type of Home: House(daughter's home) Home Access: Level entry     Home Layout:  Two level;Bed/bath upstairs(Sleeps first floor; bathroom 2nd floor)        Prior Function Level of Independence: Needs assistance         Comments: Ambulates with RW at baseline (shorter distances d/t B knee pain).  Per  chart (information obtained regarding pt): pt incontinent and needs assist for bathing and dressing     Hand Dominance        Extremity/Trunk Assessment   Upper Extremity Assessment Upper Extremity Assessment: Generalized weakness    Lower Extremity Assessment Lower Extremity Assessment: Generalized weakness(B LE at least 3+/5 hip flexion, knee flexion/extension, and DF)    Cervical / Trunk Assessment Cervical / Trunk Assessment: Kyphotic  Communication   Communication: No difficulties  Cognition Arousal/Alertness: Awake/alert Behavior During Therapy: Flat affect Overall Cognitive Status: Within Functional Limits for tasks assessed                                        General Comments General comments (skin integrity, edema, etc.): Pt initially sitting edge of bed upon PT arrival; spilled drink noted on floor (had partially dried) and pt's socks wet; NT notified and came to assist with cleaning pt up.  Nursing cleared pt for participation in physical therapy.  Pt agreeable to PT session.    Exercises     Assessment/Plan    PT Assessment Patient needs continued PT services  PT Problem List Decreased strength;Decreased range of motion;Decreased activity tolerance;Decreased balance;Decreased mobility;Pain       PT Treatment Interventions DME instruction;Stair training;Gait training;Functional mobility training;Therapeutic activities;Therapeutic exercise;Balance training;Patient/family education    PT Goals (Current goals can be found in the Care Plan section)  Acute Rehab PT Goals Patient Stated Goal: to have less pain PT Goal Formulation: With patient Time For Goal Achievement: 04/30/17 Potential to Achieve Goals: Fair    Frequency Min 2X/week   Barriers to discharge Decreased caregiver support      Co-evaluation               AM-PAC PT "6 Clicks" Daily Activity  Outcome Measure Difficulty turning over in bed (including adjusting  bedclothes, sheets and blankets)?: A Little Difficulty moving from lying on back to sitting on the side of the bed? : A Lot Difficulty sitting down on and standing up from a chair with arms (e.g., wheelchair, bedside commode, etc,.)?: Unable Help needed moving to and from a bed to chair (including a wheelchair)?: A Little Help needed walking in hospital room?: A Little Help needed climbing 3-5 steps with a railing? : A Lot 6 Click Score: 14    End of Session Equipment Utilized During Treatment: Gait belt Activity Tolerance: Patient tolerated treatment well Patient left: in bed(bed in lowest position) Nurse Communication: (nursing tech notified of pt's mobility status and pt's pain (level of pain and location of pain)) PT Visit Diagnosis: Other abnormalities of gait and mobility (R26.89);Muscle weakness (generalized) (M62.81);Difficulty in walking, not elsewhere classified (R26.2) Pain - Right/Left: Right Pain - part of body: Knee    Time: 1125-1150 PT Time Calculation (min) (ACUTE ONLY): 25 min   Charges:   PT Evaluation $PT Eval Low Complexity: 1 Low PT Treatments $Therapeutic Activity: 8-22 mins   PT G CodesHendricks Limes:        Kristalynn Coddington, PT 04/16/17, 1:50 PM [phone]

## 2017-04-17 DIAGNOSIS — F341 Dysthymic disorder: Secondary | ICD-10-CM | POA: Diagnosis not present

## 2017-04-17 LAB — GLUCOSE, CAPILLARY
GLUCOSE-CAPILLARY: 147 mg/dL — AB (ref 65–99)
GLUCOSE-CAPILLARY: 143 mg/dL — AB (ref 65–99)
Glucose-Capillary: 180 mg/dL — ABNORMAL HIGH (ref 65–99)
Glucose-Capillary: 343 mg/dL — ABNORMAL HIGH (ref 65–99)

## 2017-04-17 NOTE — ED Notes (Signed)
Patient given meal tray and drink 

## 2017-04-17 NOTE — Consult Note (Signed)
[facility] Consult   Reason for Consult: Follow-up consult 52 year old woman who is in the emergency room awaiting placement Referring Physician: Mariea Clonts Patient Identification: [name] [name] MRN:  [id number] Principal Diagnosis: Dysthymia Diagnosis:   Patient Active Problem List   Diagnosis Date Noted  . Anxiety [F41.9] 04/02/2017  . Acute cystitis [N30.00] 04/02/2017  . Urinary incontinence [R32] 01/25/2016  . Fecal incontinence [R15.9] 01/25/2016  . Dysthymia [F34.1] 01/25/2016    Total Time spent with patient: 20 minutes  Subjective:   [name] [name] is a 52 y.o. female patient admitted with "I am all right".  HPI: Patient seen chart reviewed.  Reviewed with TTS and social work.  52 year old woman with some cognitive impairment who is in the emergency room because of a lack of any other place to live.  Family has left her here and are refusing to take care of her anymore.  Patient has no financial resources to use.  She has no psychiatric complaints.  Continues to deny depression denies psychotic symptoms denies any dangerousness.  Question had arisen about her ambulatory status but the patient does appear to be able to ambulate with a walker.  Past Psychiatric History: Previous history of evaluation and appears to have some degree of cognitive deficit.  Risk to Self: Suicidal Ideation: No Suicidal Intent: No Is patient at risk for suicide?: No Suicidal Plan?: No Access to Means: No What has been your use of drugs/alcohol within the last 12 months?: Reports of no current use How many times?: 0 Other Self Harm Risks: Reports of none Triggers for Past Attempts: None known Intentional Self Injurious Behavior: None Risk to Others: Homicidal Ideation: No Thoughts of Harm to Others: No Current Homicidal Intent: No Current Homicidal Plan: No Access to Homicidal Means: No Identified Victim: Reports of none History of harm to others?: No Violent Behavior  Description: Reports of none Does patient have access to weapons?: No Criminal Charges Pending?: No Does patient have a court date: No Prior Inpatient Therapy: Prior Inpatient Therapy: No Prior Therapy Dates: n/a Prior Therapy Facilty/Provider(s): n/a Reason for Treatment: n/a Prior Outpatient Therapy: Prior Outpatient Therapy: Yes Prior Therapy Dates: 2017 Prior Therapy Facilty/Provider(s): [facility] Reason for Treatment: Psychosis Does patient have an ACCT team?: No Does patient have Intensive In-House Services?  : No Does patient have [facility] services? : No Does patient have P4CC services?: No  Past Medical History:  Past Medical History:  Diagnosis Date  . Anxiety   . Arthritis   . Diabetes mellitus without complication ([place])   . Hypertension   . Stroke [facility]) 2004   x 2    Past Surgical History:  Procedure Laterality Date  . NO PAST SURGERIES     Family History: No family history on file. Family Psychiatric  History: None Social History:  Social History   Substance and Sexual Activity  Alcohol Use No     Social History   Substance and Sexual Activity  Drug Use No    Social History   Socioeconomic History  . Marital status: Single    Spouse name: None  . Number of children: None  . Years of education: None  . Highest education level: None  Social Needs  . Financial resource strain: None  . Food insecurity - worry: None  . Food insecurity - inability: None  . Transportation needs - medical: None  . Transportation needs - non-medical: None  Occupational History  . None  Tobacco Use  . Smoking  status: Current Every Day Smoker    Packs/day: 1.00    Types: Cigarettes  . Smokeless tobacco: Never Used  Substance and Sexual Activity  . Alcohol use: No  . Drug use: No  . Sexual activity: None  Other Topics Concern  . None  Social History Narrative  . None   Additional Social History:    Allergies:   Allergies  Allergen Reactions   . Latex Rash    Severe itching.    Labs:  Results for orders placed or performed during the hospital encounter of 04/02/17 (from the past 48 hour(s))  Glucose, capillary     Status: Abnormal   Collection Time: 04/15/17  4:45 PM  Result Value Ref Range   Glucose-Capillary 190 (H) 65 - 99 mg/dL  Glucose, capillary     Status: Abnormal   Collection Time: 04/15/17  9:14 PM  Result Value Ref Range   Glucose-Capillary 214 (H) 65 - 99 mg/dL   Comment 1 Notify RN   Glucose, capillary     Status: Abnormal   Collection Time: 04/16/17  8:26 AM  Result Value Ref Range   Glucose-Capillary 157 (H) 65 - 99 mg/dL  Glucose, capillary     Status: Abnormal   Collection Time: 04/16/17 12:02 PM  Result Value Ref Range   Glucose-Capillary 193 (H) 65 - 99 mg/dL  Glucose, capillary     Status: Abnormal   Collection Time: 04/16/17  4:29 PM  Result Value Ref Range   Glucose-Capillary 117 (H) 65 - 99 mg/dL  Glucose, capillary     Status: Abnormal   Collection Time: 04/16/17  9:52 PM  Result Value Ref Range   Glucose-Capillary 222 (H) 65 - 99 mg/dL  Glucose, capillary     Status: Abnormal   Collection Time: 04/17/17  7:59 AM  Result Value Ref Range   Glucose-Capillary 147 (H) 65 - 99 mg/dL  Glucose, capillary     Status: Abnormal   Collection Time: 04/17/17 12:02 PM  Result Value Ref Range   Glucose-Capillary 143 (H) 65 - 99 mg/dL    Current Facility-Administered Medications  Medication Dose Route Frequency Provider Last Rate Last Dose  . amLODipine (NORVASC) tablet 5 mg  5 mg Oral Daily Nena Polio, MD   5 mg at 04/17/17 1100  . diclofenac sodium (VOLTAREN) 1 % transdermal gel 2 g  2 g Topical QID Nance Pear, MD   2 g at 04/17/17 1500  . insulin aspart (novoLOG) injection 0-20 Units  0-20 Units Subcutaneous TID WC Harvest Dark, MD   3 Units at 04/17/17 1206  . insulin aspart (novoLOG) injection 0-5 Units  0-5 Units Subcutaneous QHS Harvest Dark, MD   2 Units at 04/16/17  2201  . lisinopril (PRINIVIL,ZESTRIL) tablet 20 mg  20 mg Oral Marthe Patch, MD   20 mg at 04/17/17 0800  . metFORMIN (GLUCOPHAGE-XR) 24 hr tablet 500 mg  500 mg Oral Q breakfast Nena Polio, MD   500 mg at 04/17/17 0817  . traMADol (ULTRAM) tablet 50 mg  50 mg Oral BID Nena Polio, MD   50 mg at 04/17/17 1100   Current Outpatient Medications  Medication Sig Dispense Refill  . atorvastatin (LIPITOR) 40 MG tablet Take 40 mg by mouth daily.    . blood glucose meter kit and supplies KIT Dispense based on patient and insurance preference. Use up to four times daily as directed. (FOR ICD-9 250.00, 250.01). 1 each 0  . metFORMIN (GLUMETZA)  500 MG (MOD) 24 hr tablet Take 500 mg by mouth daily with breakfast.    . amLODipine (NORVASC) 5 MG tablet Take 1 tablet (5 mg total) by mouth daily. (Patient not taking: Reported on 02/19/2017) 30 tablet 1  . glyBURIDE (DIABETA) 5 MG tablet Take 2 tablets (10 mg total) by mouth daily with breakfast. (Patient not taking: Reported on 02/19/2017) 60 tablet 1  . insulin aspart (NOVOLOG) 100 UNIT/ML injection Please check blood sugar twice daily (morning and afternoon) Please administer insulin subcutaneously as follows for blood glucose reading: BG 200-250: 2 units SQ BG 251-300: 4 units SQ BG 301-350: 6 units SQ BG >350: 8 units SQ (Patient not taking: Reported on 02/19/2017) 10 mL 3  . insulin glargine (LANTUS) 100 UNIT/ML injection Inject 0.65 mLs (65 Units total) into the skin at bedtime. (Patient not taking: Reported on 02/19/2017) 20 mL 0  . lisinopril (PRINIVIL,ZESTRIL) 20 MG tablet Take 1 tablet (20 mg total) by mouth every morning. (Patient not taking: Reported on 02/19/2017) 30 tablet 1    Musculoskeletal: Strength & Muscle Tone: decreased Gait & Station: unsteady Patient leans: N/A  Psychiatric Specialty Exam: Physical Exam  Nursing note and vitals reviewed. Constitutional: She appears well-developed and well-nourished.  HENT:   Head: Normocephalic and atraumatic.  Eyes: Conjunctivae are normal. Pupils are equal, round, and reactive to light.  Neck: Normal range of motion.  Cardiovascular: Normal heart sounds.  Respiratory: Effort normal.  GI: Soft.  Musculoskeletal: Normal range of motion.  Neurological: She is alert.  Skin: Skin is warm and dry.  Psychiatric: Her affect is blunt. Her speech is delayed. She is slowed. Thought content is not paranoid. She expresses no homicidal and no suicidal ideation. She exhibits abnormal recent memory.    Review of Systems  Constitutional: Negative.   HENT: Negative.   Eyes: Negative.   Respiratory: Negative.   Cardiovascular: Negative.   Gastrointestinal: Negative.   Musculoskeletal: Negative.   Skin: Negative.   Neurological: Positive for focal weakness.  Psychiatric/Behavioral: Positive for memory loss. Negative for depression and suicidal ideas.    Blood pressure (!) 149/93, pulse (!) 105, temperature 98.4 F (36.9 C), temperature source Oral, resp. rate 16, height _0  (1.727 m), weight 84.4 kg (186 lb), SpO2 99 %.Body mass index is 28.28 kg/m.  General Appearance: Casual  Eye Contact:  Minimal  Speech:  Slow  Volume:  Decreased  Mood:  Dysphoric  Affect:  Constricted  Thought Process:  Goal Directed  Orientation:  Full (Time, Place, and Person)  Thought Content:  Logical  Suicidal Thoughts:  No  Homicidal Thoughts:  No  Memory:  Immediate;   Fair Recent;   Fair Remote;   Fair  Judgement:  Fair  Insight:  Fair  Psychomotor Activity:  Decreased  Concentration:  Concentration: Fair  Recall:  [employer] of Knowledge:  Fair  Language:  Fair  Akathisia:  No  Handed:  Right  AIMS (if indicated):     Assets:  Desire for Improvement Resilience  ADL's:  Impaired  Cognition:  Impaired,  Mild  Sleep:        Treatment Plan Summary: Plan 52 year old woman with probably some degree of cognitive impairment and mild dysphoria but not severe depression.   No indication of suicidality.  Not psychotic.  We have been trying to find some kind of appropriate placement for the patient.  Spoke with emergency room physician today who was concerned about trying to find appropriate placement.  Patient is  not requiring psychiatric hospitalization.  No it did not need for psychiatric medicine.  We will continue to follow up just as needed.  Disposition: No evidence of imminent risk to self or others at present.    Alethia Berthold, MD 04/17/2017 4:33 PM

## 2017-04-17 NOTE — ED Notes (Signed)
VOL  PENDING  PLACEMENT 

## 2017-04-17 NOTE — ED Notes (Signed)
Hourly rounding reveals patient sleeping in room. No complaints, stable, in no acute distress. Q15 minute rounds and monitoring via Security Cameras to continue. 

## 2017-04-17 NOTE — ED Notes (Signed)
Pt ambulated approx. 30 feet to bathroom and back with rolling walker and standby assist. Pt ambulates with short, staggering gait, bent over at the waist almost 90 degrees.

## 2017-04-17 NOTE — ED Notes (Signed)
This tech assisted pt to put legs up in bed if she was going to rest for safety reasons.

## 2017-04-17 NOTE — ED Notes (Addendum)
This Tech and Helmut MusterAlicia, Charity fundraiserN, got pt up with stand by assist pt ambulated with walker; pt then was wiped down with soap and wipes from neck to bottom of feet. Pt was placed in new scrubs, dry brief and clean socks

## 2017-04-17 NOTE — ED Provider Notes (Signed)
-----------------------------------------   7:30 AM on 04/17/2017 -----------------------------------------   Blood pressure (!) 139/96, pulse 96, temperature 98.2 F (36.8 C), temperature source Oral, resp. rate 18, height 5\' 8"  (1.727 m), weight 84.4 kg (186 lb), SpO2 100 %.  The patient had no acute events since last update.  Calm and cooperative at this time.  Disposition is pending Psychiatry/Behavioral Medicine team recommendations.     Rockne MenghiniNorman, Anne-Caroline, MD 04/17/17 0730

## 2017-04-17 NOTE — ED Notes (Signed)
Pt fed dinner tray. 

## 2017-04-17 NOTE — Progress Notes (Signed)
While rounding Nurse and Dr was talking about Pt. Chaplain volunteered to see Pt. Pt ws resting in the fetal position. Ch ask if he could pray for Pt and she said yes. Chaplain prayed a prayer of hope.    04/17/17 1000  Clinical Encounter Type  Visited With Patient  Visit Type Initial  Referral From Nurse  Spiritual Encounters  Spiritual Needs Prayer

## 2017-04-17 NOTE — ED Notes (Signed)
Pt again ambulated approx. 30 feet to bathroom and back to room with rolling walker and standby assist.

## 2017-04-17 NOTE — ED Notes (Signed)
Pt resting in bed and covered up with blanket

## 2017-04-17 NOTE — Clinical Social Work Note (Signed)
P/T recommending HHPT. Patient able to ambulate with walker. CSW staffed with Social Work AD Wandra MannanZack Brooks. CSW continuing to follow for transitions of care/discharge needs.   Corlis HoveJeneya Lylliana Kitamura, Theresia MajorsLCSWA, [facility] Clinical Social Worker-ED [phone]

## 2017-04-17 NOTE — ED Notes (Signed)
Hourly rounding reveals patient in room. No complaints, stable, in no acute distress. Q15 minute rounds and monitoring via Security Cameras to continue. 

## 2017-04-18 LAB — GLUCOSE, CAPILLARY
GLUCOSE-CAPILLARY: 164 mg/dL — AB (ref 65–99)
Glucose-Capillary: 118 mg/dL — ABNORMAL HIGH (ref 65–99)

## 2017-04-18 NOTE — ED Provider Notes (Signed)
-----------------------------------------   6:04 AM on 04/18/2017 -----------------------------------------   Blood pressure (!) 144/82, pulse 78, temperature 98.7 F (37.1 C), temperature source Oral, resp. rate 16, height 1.727 m (5\' 8" ), weight 84.4 kg (186 lb), SpO2 100 %.  The patient had no acute events since last update.  Calm and cooperative at this time.  Patient was apparently discharged, but then required additional social work arrangements for home health. Disposition still pending.     Loleta RoseForbach, Clerence Gubser, MD 04/18/17 [phone]

## 2017-04-18 NOTE — ED Notes (Signed)
Pt given breakfast tray

## 2017-04-18 NOTE — ED Notes (Addendum)
Pt working with PT at this time. Maintained on 15 minute checks.

## 2017-04-18 NOTE — ED Notes (Addendum)
Pt denies SI/HI/AVH. Pt given discharge instructions.  Pt states understanding. Pt states receipt of all belongings.  Patient discharged in Police Custody.  Patient had multiple warrants for her arrest.

## 2017-04-18 NOTE — ED Provider Notes (Signed)
-----------------------------------------   2:06 PM on 04/18/2017 -----------------------------------------   Blood pressure (!) 154/91, pulse (!) 104, temperature 97.6 F (36.4 C), temperature source Oral, resp. rate 18, height 5\' 8"  (1.727 m), weight 84.4 kg (186 lb), SpO2 94 %.  The patient had no acute events since last update.  Calm and cooperative at this time.  Social work has secured a position for the patient had a shelter.  Please will escort the patient to her sister's home and she will collect her belongings and then be escorted to the shelter from there.     Myrna BlazerSchaevitz, Maisy [place] Matthew, MD 04/18/17 [phone]

## 2017-04-18 NOTE — Care Management Note (Signed)
Case Management Note  Patient Details  Name: [name] HohJennifer R Burmaster MRN: [id number] Date of Birth: September 12, 1965  Subjective/Objective:     Spoke to patient at bedside. She does not make eye contact but says she is listening and can hear me fine. I have explained to the patient that I am leaving an application for [facility]/medication meanagement clinic, and [facility] on her chart for her to complete. I asked ehr when she last saw a regular PCP and she states tht [facility]. In light of this I have given her detailed information about [facility], how they function and the importance of completing the application and calling them.  I have asked if her if she would be interested in home health PT services. She states at this time she has nowhere to go, but knows she will not be going to the family care home listed in her chart.       I have and the RN and MD aware of the conversation.      Action/Plan:   Expected Discharge Date:                  Expected Discharge Plan:     In-House Referral:     Discharge planning Services     Post Acute Care Choice:    Choice offered to:     DME Arranged:    DME Agency:     HH Arranged:    HH Agency:     Status of Service:     If discussed at [employer] Length of Stay Meetings, dates discussed:    Additional Comments:  Berna BueCheryl Brandelyn Henne, RN 04/18/2017, 9:21 AM

## 2017-04-18 NOTE — ED Notes (Signed)
Patient refused shower due to pain will take one after pain sets in per patient.

## 2017-04-18 NOTE — ED Notes (Signed)
Pt denies having to use bathroom right now.

## 2017-04-18 NOTE — Progress Notes (Signed)
Physical Therapy Treatment Patient Details Name: [name] [name] Slight MRN: [id number] DOB: [birth year] Today's Date: 04/18/2017    History of Present Illness Pt is a 52 y.o. female presenting to hospital with medication noncompliance and behavioral issues.  PMH includes schizophrenia, anxiety, DM, stroke x2, and htn.    PT Comments    Pt ambulated w/ RW 70', performed toileting, and then ambulated 30' back to bed. Pt is steady with RW, but demonstrated persistent forward flexion with ambulation. Pt steady managing clothes in standing w/o UE support. Pt requires min assist for sit to stand from lower surfaces, but anticipate she would be mod I with a standard height bed and may benefit from a toilet riser. PT updated social worker with Pt's functional progress.  Follow Up Recommendations  Home health PT;Supervision/Assistance - 24 hour     Equipment Recommendations  Rolling walker with 5" wheels;3in1 (PT)    Recommendations for Other Services       Precautions / Restrictions Precautions Precautions: Fall Restrictions Weight Bearing Restrictions: No    Mobility  Bed Mobility Overal bed mobility: Needs Assistance Bed Mobility: Supine to Sit     Supine to sit: Modified independent (Device/Increase time) Sit to supine: Min assist   General bed mobility comments: Pt required assist to lift BLE up onto bed from sit to supine. Pt required extra time and effort to move from supine to sit.  Transfers Overall transfer level: Needs assistance Equipment used: Rolling walker (2 wheeled) Transfers: Sit to/from Stand Sit to Stand: Min assist         General transfer comment: Pt required min assist w/ RW for sit to stand from EOB and during toilet transfer which were lower surfaces. Pt required vc's to scoot to edge of bed and for hand placement to push off of bed.  Ambulation/Gait Ambulation/Gait assistance: Min guard;+2 safety/equipment Ambulation Distance (Feet): (70',  30') Assistive device: Rolling walker (2 wheeled) Gait Pattern/deviations: Step-to pattern;Antalgic Gait velocity: decreased   General Gait Details: forward flexed posture; decreased B step length; steady with RW   Stairs            Wheelchair Mobility    Modified Rankin (Stroke Patients Only)       Balance Overall balance assessment: Needs assistance Sitting-balance support: No upper extremity supported;Feet supported Sitting balance-Leahy Scale: Normal Sitting balance - Comments: steady sitting reaching above head within BOS   Standing balance support: Bilateral upper extremity supported Standing balance-Leahy Scale: Good Standing balance comment: Pt demonstrated donning and doffing of pants during toileting and standing at sink to wash hands without UE support.                            Cognition Arousal/Alertness: Awake/alert Behavior During Therapy: WFL for tasks assessed/performed Overall Cognitive Status: Within Functional Limits for tasks assessed                                 General Comments: Pt was agreeable to session.      Exercises General Exercises - Upper Extremity Shoulder Horizontal ABduction: AROM;Both;5 reps;Strengthening;Seated(cueing for scapular retraction to facilitate decreased kyphosis)    General Comments  Pt was agreeable to session.      Pertinent Vitals/Pain Pain Assessment: Faces Faces Pain Scale: Hurts even more Pain Location: Pt reports that her whole body hurts when she is moving from supine to sit at EOB.  Pain Descriptors / Indicators: Aching Pain Intervention(s): Limited activity within patient's tolerance;Monitored during session;Premedicated before session;Utilized relaxation techniques    Home Living                      Prior Function            PT Goals (current goals can now be found in the care plan section) Acute Rehab PT Goals Patient Stated Goal: to have less pain PT  Goal Formulation: With patient Time For Goal Achievement: 04/30/17 Potential to Achieve Goals: Fair Progress towards PT goals: Progressing toward goals    Frequency    Min 2X/week      PT Plan Current plan remains appropriate    Co-evaluation              AM-PAC PT "6 Clicks" Daily Activity  Outcome Measure  Difficulty turning over in bed (including adjusting bedclothes, sheets and blankets)?: A Little Difficulty moving from lying on back to sitting on the side of the bed? : A Little Difficulty sitting down on and standing up from a chair with arms (e.g., wheelchair, bedside commode, etc,.)?: Unable Help needed moving to and from a bed to chair (including a wheelchair)?: A Little Help needed walking in hospital room?: A Little Help needed climbing 3-5 steps with a railing? : A Lot 6 Click Score: 15    End of Session Equipment Utilized During Treatment: Gait belt Activity Tolerance: Patient tolerated treatment well Patient left: in bed Nurse Communication: Mobility status PT Visit Diagnosis: Other abnormalities of gait and mobility (R26.89);Muscle weakness (generalized) (M62.81);Difficulty in walking, not elsewhere classified (R26.2) Pain - Right/Left: Right Pain - part of body: Knee     Time: 1216-1232 PT Time Calculation (min) (ACUTE ONLY): 16 min  Charges:                       G Codes:        Mackson Botz Mondrian-Pardue, SPT 04/18/2017, 1:17 PM

## 2017-04-18 NOTE — Clinical Social Work Note (Addendum)
CSW received call from Social Work AD [name] [name] after staffing case with Wellsite geologistMedical Director Dr. Judithann [name]. Patient is her own guardian. Patient reassessed by P/T today and is walking 70+ feet with walker. Patient to be discharged with a non-emergency [employer] escort to daughter's home at [address], [place] to collect her personal belongings. Patient has been given a Paediatric nurseshelter listing and local resource listing. RNCM provided and explained in great detail applications for [facility]medication management [facility] and [facility]. Patient to also be provided bus passes. CSW updated patient and patient agreeable. CSW updated Dr. Pershing ProudSchaevitz and RNs [name] DandyMary and [name] [name]. CSW to also update patient's APS Social Worker [name] [name] and [facility] Care Coordinator [name] [name]. CSW signing off as no further Social Work needs identified.   [name] [name], [name] MajorsLCSWA, [facility] Clinical Social Worker-ED [phone]

## 2017-04-18 NOTE — ED Notes (Signed)
Offered pt assistance to get up to go to bathroom, pt refusing at this time. Will try again within an hour.

## 2017-04-18 NOTE — ED Notes (Signed)
Pt given lunch tray and juice. 

## 2017-04-18 NOTE — ED Notes (Addendum)
Pt to bathroom at this time with assistance of PT and this tech at side.

## 2017-04-28 ENCOUNTER — Emergency Department
Admission: EM | Admit: 2017-04-28 | Discharge: 2017-04-30 | Disposition: A | Discharge status: Home / Self Care | Payer: Medicaid Other | Attending: Emergency Medicine | Admitting: Emergency Medicine

## 2017-04-28 ENCOUNTER — Encounter: Payer: Self-pay | Admitting: Emergency Medicine

## 2017-04-28 ENCOUNTER — Other Ambulatory Visit: Payer: Self-pay

## 2017-04-28 DIAGNOSIS — Z590 Homelessness: Secondary | ICD-10-CM | POA: Insufficient documentation

## 2017-04-28 DIAGNOSIS — Z79899 Other long term (current) drug therapy: Secondary | ICD-10-CM | POA: Diagnosis not present

## 2017-04-28 DIAGNOSIS — Z794 Long term (current) use of insulin: Secondary | ICD-10-CM | POA: Insufficient documentation

## 2017-04-28 DIAGNOSIS — E119 Type 2 diabetes mellitus without complications: Secondary | ICD-10-CM | POA: Insufficient documentation

## 2017-04-28 DIAGNOSIS — I10 Essential (primary) hypertension: Secondary | ICD-10-CM | POA: Diagnosis not present

## 2017-04-28 DIAGNOSIS — R4182 Altered mental status, unspecified: Secondary | ICD-10-CM | POA: Diagnosis not present

## 2017-04-28 DIAGNOSIS — Z046 Encounter for general psychiatric examination, requested by authority: Secondary | ICD-10-CM | POA: Diagnosis present

## 2017-04-28 DIAGNOSIS — F341 Dysthymic disorder: Secondary | ICD-10-CM | POA: Insufficient documentation

## 2017-04-28 DIAGNOSIS — F17210 Nicotine dependence, cigarettes, uncomplicated: Secondary | ICD-10-CM | POA: Insufficient documentation

## 2017-04-28 DIAGNOSIS — F419 Anxiety disorder, unspecified: Secondary | ICD-10-CM | POA: Diagnosis not present

## 2017-04-28 LAB — COMPREHENSIVE METABOLIC PANEL
ALBUMIN: 3.7 g/dL (ref 3.5–5.0)
ALK PHOS: 75 U/L (ref 38–126)
ALT: 37 U/L (ref 14–54)
ANION GAP: 6 (ref 5–15)
AST: 36 U/L (ref 15–41)
BUN: 27 mg/dL — ABNORMAL HIGH (ref 6–20)
CHLORIDE: 104 mmol/L (ref 101–111)
CO2: 27 mmol/L (ref 22–32)
Calcium: 9.1 mg/dL (ref 8.9–10.3)
Creatinine, Ser: 0.9 mg/dL (ref 0.44–1.00)
GFR calc Af Amer: >60 mL/min (ref >60)
GFR calc non Af Amer: >60 mL/min (ref >60)
GLUCOSE: 411 mg/dL — AB (ref 65–99)
Potassium: 3.8 mmol/L (ref 3.5–5.1)
SODIUM: 137 mmol/L (ref 135–145)
Total Bilirubin: 0.7 mg/dL (ref 0.3–1.2)
Total Protein: 8 g/dL (ref 6.5–8.1)

## 2017-04-28 LAB — TSH: TSH: 1.256 u[IU]/mL (ref 0.350–4.500)

## 2017-04-28 LAB — GLUCOSE, CAPILLARY
GLUCOSE-CAPILLARY: 349 mg/dL — AB (ref 65–99)
GLUCOSE-CAPILLARY: 353 mg/dL — AB (ref 65–99)
Glucose-Capillary: 132 mg/dL — ABNORMAL HIGH (ref 65–99)
Glucose-Capillary: 162 mg/dL — ABNORMAL HIGH (ref 65–99)
Glucose-Capillary: 271 mg/dL — ABNORMAL HIGH (ref 65–99)

## 2017-04-28 LAB — CBC WITH DIFFERENTIAL/PLATELET
BASOS PCT: 0 %
Basophils Absolute: 0 10*3/uL (ref 0–0.1)
EOS ABS: 0.2 10*3/uL (ref 0–0.7)
Eosinophils Relative: 3 %
HCT: 37.3 % (ref 35.0–47.0)
HEMOGLOBIN: 12.6 g/dL (ref 12.0–16.0)
Lymphocytes Relative: 30 %
Lymphs Abs: 2.3 10*3/uL (ref 1.0–3.6)
MCH: 31.1 pg (ref 26.0–34.0)
MCHC: 33.9 g/dL (ref 32.0–36.0)
MCV: 91.7 fL (ref 80.0–100.0)
MONOS PCT: 10 %
Monocytes Absolute: 0.7 10*3/uL (ref 0.2–0.9)
NEUTROS PCT: 57 %
Neutro Abs: 4.3 10*3/uL (ref 1.4–6.5)
PLATELETS: 276 10*3/uL (ref 150–440)
RBC: 4.06 MIL/uL (ref 3.80–5.20)
RDW: 12.6 % (ref 11.5–14.5)
WBC: 7.5 10*3/uL (ref 3.6–11.0)

## 2017-04-28 LAB — ACETAMINOPHEN LEVEL: Acetaminophen (Tylenol), Serum: <10 ug/mL — ABNORMAL LOW (ref 10–30)

## 2017-04-28 LAB — VITAMIN B12: Vitamin B-12: 455 pg/mL (ref 180–914)

## 2017-04-28 LAB — SALICYLATE LEVEL: Salicylate Lvl: <7 mg/dL (ref 2.8–30.0)

## 2017-04-28 LAB — FOLATE: Folate: 11.4 ng/mL (ref >5.9)

## 2017-04-28 LAB — ETHANOL

## 2017-04-28 MED ORDER — AMLODIPINE BESYLATE 5 MG PO TABS
5.0000 mg | ORAL_TABLET | Freq: Every day | ORAL | Status: DC
  Administered 2017-04-28 – 2017-04-30 (×3): 5 mg via ORAL
  Filled 2017-04-28 (×3): qty 1

## 2017-04-28 MED ORDER — INSULIN ASPART 100 UNIT/ML ~~LOC~~ SOLN: 0.0000 [IU] | Freq: Every evening | SUBCUTANEOUS | Status: DC | PRN

## 2017-04-28 MED ORDER — DICLOFENAC SODIUM 1 % TD GEL
2.0000 g | Freq: Four times a day (QID) | TRANSDERMAL | Status: DC
  Administered 2017-04-28 – 2017-04-30 (×8): 2 g via TOPICAL
  Filled 2017-04-28 (×2): qty 100

## 2017-04-28 MED ORDER — INSULIN ASPART 100 UNIT/ML ~~LOC~~ SOLN
0.0000 [IU] | Freq: Every day | SUBCUTANEOUS | Status: DC
  Administered 2017-04-29: 5 [IU] via SUBCUTANEOUS
  Filled 2017-04-28: qty 1

## 2017-04-28 MED ORDER — TRAMADOL HCL 50 MG PO TABS
50.0000 mg | ORAL_TABLET | Freq: Two times a day (BID) | ORAL | Status: DC
  Administered 2017-04-28 – 2017-04-30 (×5): 50 mg via ORAL
  Filled 2017-04-28 (×5): qty 1

## 2017-04-28 MED ORDER — INSULIN ASPART 100 UNIT/ML ~~LOC~~ SOLN
0.0000 [IU] | Freq: Three times a day (TID) | SUBCUTANEOUS | Status: DC
  Administered 2017-04-28: 15 [IU] via SUBCUTANEOUS
  Administered 2017-04-28: 3 [IU] via SUBCUTANEOUS
  Administered 2017-04-28: 8 [IU] via SUBCUTANEOUS
  Administered 2017-04-29: 11 [IU] via SUBCUTANEOUS
  Administered 2017-04-29: 5 [IU] via SUBCUTANEOUS
  Filled 2017-04-28 (×5): qty 1

## 2017-04-28 MED ORDER — INSULIN ASPART 100 UNIT/ML ~~LOC~~ SOLN: 0.0000 [IU] | Freq: Three times a day (TID) | SUBCUTANEOUS | Status: DC

## 2017-04-28 MED ORDER — METFORMIN HCL ER 500 MG PO TB24
500.0000 mg | ORAL_TABLET | Freq: Every day | ORAL | Status: DC
  Administered 2017-04-28 – 2017-04-30 (×3): 500 mg via ORAL
  Filled 2017-04-28 (×3): qty 1

## 2017-04-28 MED ORDER — LISINOPRIL 10 MG PO TABS
20.0000 mg | ORAL_TABLET | Freq: Once | ORAL | Status: AC
  Administered 2017-04-28: 20 mg via ORAL
  Filled 2017-04-28: qty 2

## 2017-04-28 NOTE — ED Notes (Signed)
This RN spoke with [facility] about the patient and they will not be able to take the patient. This RN will attempt to make other contacts for place for the patient to go. Pt refusing to go back to her daughters home but is agreeable to go to a shelter in [place]. Will attempt to contact one of the shelters.

## 2017-04-28 NOTE — ED Provider Notes (Signed)
[facility] Emergency Department Provider Note   ____________________________________________   First MD Initiated Contact with Patient 04/28/17 0319     (approximate)  I have reviewed the triage vital signs and the nursing notes.   HISTORY  Chief Complaint Altered Mental Status    HPI [name] [name] is a 52 y.o. female brought to the ED under IVC by police for homelessness, confusion and possible delusions. Patient has a history of anxiety and poor social situation who has had several recent ED stays for psychiatric and social work issues.  Patient was discharged on 2/27 to jail where she stayed until yesterday.  They tried to return her to her family care home who refused her back.  Reportedly her daughter also does not wish for patient to stay with her.  Patient has chronic knee issues, ambulates with a cane, and complains of chronic knee pain.  Denies active SI/HI/AH/VH.  Voices no other medical complaints.  Specifically, denies recent fever, chills, chest pain, shortness of breath, abdominal pain, nausea, vomiting, diarrhea.   Past Medical History:  Diagnosis Date  . Anxiety   . Arthritis   . Diabetes mellitus without complication ([place])   . Hypertension   . Stroke [facility]) 2004   x 2    Patient Active Problem List   Diagnosis Date Noted  . Anxiety 04/02/2017  . Acute cystitis 04/02/2017  . Urinary incontinence 01/25/2016  . Fecal incontinence 01/25/2016  . Dysthymia 01/25/2016    Past Surgical History:  Procedure Laterality Date  . NO PAST SURGERIES      Prior to Admission medications   Medication Sig Start Date End Date Taking? Authorizing Provider  amLODipine (NORVASC) 5 MG tablet Take 1 tablet (5 mg total) by mouth daily. Patient not taking: Reported on 02/19/2017 02/02/16 02/01/17  Harvest Dark, MD  atorvastatin (LIPITOR) 40 MG tablet Take 40 mg by mouth daily.    [provider]  blood glucose meter kit and  supplies KIT Dispense based on patient and insurance preference. Use up to four times daily as directed. (FOR ICD-9 250.00, 250.01). 02/02/16   Harvest Dark, MD  glyBURIDE (DIABETA) 5 MG tablet Take 2 tablets (10 mg total) by mouth daily with breakfast. Patient not taking: Reported on 02/19/2017 02/02/16   Harvest Dark, MD  insulin aspart (NOVOLOG) 100 UNIT/ML injection Please check blood sugar twice daily (morning and afternoon) Please administer insulin subcutaneously as follows for blood glucose reading: BG 200-250: 2 units SQ BG 251-300: 4 units SQ BG 301-350: 6 units SQ BG >350: 8 units SQ Patient not taking: Reported on 02/19/2017 02/02/16 02/01/17  Harvest Dark, MD  insulin glargine (LANTUS) 100 UNIT/ML injection Inject 0.65 mLs (65 Units total) into the skin at bedtime. Patient not taking: Reported on 02/19/2017 12/18/15   Carrie Mew, MD  lisinopril (PRINIVIL,ZESTRIL) 20 MG tablet Take 1 tablet (20 mg total) by mouth every morning. Patient not taking: Reported on 02/19/2017 02/02/16   Harvest Dark, MD  metFORMIN (GLUMETZA) 500 MG (MOD) 24 hr tablet Take 500 mg by mouth daily with breakfast.    [provider]    Allergies Latex  History reviewed. No pertinent family history.  Social History Social History   Tobacco Use  . Smoking status: Current Every Day Smoker    Packs/day: 1.00    Types: Cigarettes  . Smokeless tobacco: Never Used  Substance Use Topics  . Alcohol use: No  . Drug use: No    Review of Systems  Constitutional: No fever/chills. Eyes: No visual changes. ENT: No sore throat. Cardiovascular: Denies chest pain. Respiratory: Denies shortness of breath. Gastrointestinal: No abdominal pain.  No nausea, no vomiting.  No diarrhea.  No constipation. Genitourinary: Negative for dysuria. Musculoskeletal: Negative for back pain. Skin: Negative for rash. Neurological: Negative for headaches, focal weakness or  numbness. Psychiatric:Positive for poor social situation.  ____________________________________________   PHYSICAL EXAM:  VITAL SIGNS: ED Triage Vitals [04/28/17 0330]  Enc Vitals Group     BP (!) 165/95     Pulse Rate 95     Resp 18     Temp 98.5 F (36.9 C)     Temp Source Oral     SpO2 100 %     Weight      Height      Head Circumference      Peak Flow      Pain Score      Pain Loc      Pain Edu?      Excl. in [place]?     Constitutional: Alert and oriented.  Disheveled appearing and in no acute distress. Eyes: Conjunctivae are normal. PERRL. EOMI. Head: Atraumatic. Nose: No congestion/rhinnorhea. Mouth/Throat: Mucous membranes are moist.  Oropharynx non-erythematous. Neck: No stridor.   Cardiovascular: Normal rate, regular rhythm. Grossly normal heart sounds.  Good peripheral circulation. Respiratory: Normal respiratory effort.  No retractions. Lungs CTAB. Gastrointestinal: Soft and nontender. No distention. No abdominal bruits. No CVA tenderness. Genitourinary: Strong odor of urine about her. Musculoskeletal: No lower extremity tenderness to palpation nor edema.  Chronic bilateral knee pain.  No joint effusions. Neurologic:  Normal speech and language. No gross focal neurologic deficits are appreciated.  Skin:  Skin is warm, dry and intact. No rash noted. Psychiatric: Mood and affect are normal. Speech and behavior are normal.  ____________________________________________   LABS (all labs ordered are listed, but only abnormal results are displayed)  Labs Reviewed  COMPREHENSIVE METABOLIC PANEL - Abnormal; Notable for the following components:      Result Value   Glucose, Bld 411 (*)    BUN 27 (*)    All other components within normal limits  ACETAMINOPHEN LEVEL - Abnormal; Notable for the following components:   Acetaminophen (Tylenol), Serum <10 (*)    All other components within normal limits  GLUCOSE, CAPILLARY - Abnormal; Notable for the following  components:   Glucose-Capillary 349 (*)    All other components within normal limits  CBC WITH DIFFERENTIAL/PLATELET  SALICYLATE LEVEL  ETHANOL  URINALYSIS, COMPLETE (UACMP) WITH MICROSCOPIC  URINE DRUG SCREEN, QUALITATIVE ([facility] ONLY)  TSH  VITAMIN B12  FOLATE  CBG MONITORING, ED   ____________________________________________  EKG  None ____________________________________________  RADIOLOGY  ED MD interpretation: None  Official radiology report(s): No results found.  ____________________________________________   PROCEDURES  Procedure(s) performed: None  Procedures  Critical Care performed: No  ____________________________________________   INITIAL IMPRESSION / ASSESSMENT AND PLAN / ED COURSE  As part of my medical decision making, I reviewed the following data within the [place] notes reviewed and incorporated, Labs reviewed, A consult was requested and obtained from this/these consultant(s) Psychiatry and Notes from prior ED visits   52 year old female with anxiety and poor social situation brought under IVC by police for homelessness, possible confusion and delusions.  Will obtain screening toxicological lab work and urine, continue IVC pending TTS and [facility] psychiatry evaluations.  Will likely involve clinical social work as well.  Will order patient's home medicines.  Clinical Course as of Apr 29 622  Sat Apr 28, 2017  0624 Patient was seen by [facility] psychiatrist Dr. Olen Cordial who has rescinded her IVC.  Recommendations to check TSH, B12 and folate.  Will consult social work for patient now homeless with no support.  [JS]    Clinical Course User Index [JS] Paulette Blanch, MD     ____________________________________________   FINAL CLINICAL IMPRESSION(S) / ED DIAGNOSES  Final diagnoses:  Anxiety     ED Discharge Orders    None       Note:  This document was prepared using Dragon voice recognition software and  may include unintentional dictation errors.    Paulette Blanch, MD 04/28/17 [id number]

## 2017-04-28 NOTE — Discharge Instructions (Addendum)
Please go today to the Department of Social Services (DSS) office [facility].  You have been seen in the Emergency Department (ED)  today for psychiatric issues.  You have been evaluated by the behavioral medicine specialists and are being referred to:  [facility] [address] [place], [place] [address] Phone:  [phone] or [phone]  Open Access:   Walk-in ASSESSMENT hours, M-W-F, 8:00am - 3:00pm Advanced Acess CRISIS:  M-F, 8:00am - 8:00pm Outpatient Services Office Hours:  M-F, 8:00am - 5:00pm  Please return to the Emergency Department (ED)  immediately if you have ANY thoughts of hurting yourself or anyone else, so that we may help you.  Please avoid alcohol and drug use.  Follow up with your doctor and/or therapist as soon as possible regarding today's ED  visit.   Please follow up any other recommendations and clinic appointments provided by the psychiatry team that saw you in the Emergency Department.

## 2017-04-28 NOTE — Progress Notes (Signed)
LCSW [facility] and spoke to Darl PikesSusan -Patient is not a resident of her group home, she came to her and only remained for a week. Group home provider reported that there is a warrant for her arrest BPD will be notified. LCSW was informed patient has a son Darrelyn HillockGregory Puffenbarger [phone] and he is a support to patient.  LCSW will meet and complete assessment. Patient will not return to Group Home please do not contact Darl PikesSusan.    Kylon Philbrook LCSW [phone]-

## 2017-04-28 NOTE — ED Triage Notes (Signed)
Pt ivc'd from sheriff's office for being homeless.

## 2017-04-28 NOTE — Progress Notes (Signed)
LCSW confirmed by inquirery [employer] that there is NO arrest warrant for this patient. LCSW collected information from patient in order to complete assessment. Once discharged [facility]   LCSW called family resource her son Chaney BornGreg Prichett who resides in [place] called and left message [phone] awaiting call back left HIPPA friendly message. Patient reported she missed her knee surgery it was scheduled for Feb 26th.  Patient is not able to return to [facility]:( pts daughter) Alexander MtLCSW was informed the patient does not have medicaid or money source.    Delta Air LinesClaudine Hien Perreira LCSW [phone]

## 2017-04-28 NOTE — Clinical Social Work Note (Signed)
Clinical Social Work Assessment  Patient Details  Name: [name] [name] MRN: [id number] Date of Birth: 02-18-1966  Date of referral:  04/28/17               Reason for consult:  Emotional/Coping/Adjustment to Illness, Housing Concerns/Homelessness                Permission sought to share information with:  Family Supports Permission granted to share information::  Yes, Verbal Permission Granted  Name::     Son [phone]  Agency::     Relationship::     Contact Information:  [facility]  Housing/Transportation Living arrangements for the past 2 months:  Homeless Source of Information:  Patient Patient Interpreter Needed:  None Criminal Activity/Legal Involvement Pertinent to Current Situation/Hospitalization:  No - Comment as needed Significant Relationships:  Adult Children Lives with:  Self Do you feel safe going back to the place where you live?  No Need for family participation in patient care:  No (Coment)  Care giving concerns:  TBD   Social Worker assessment / plan: LCSW met with patient VERBAL consent given to contact family/facilities and previous [place]. LCSW   discussed possible family support in the community. She reports she was with her daughter [name] [name] and cant return there. Her son was called and this worker has not received call back. This patient states she is not homicidal or suicidal and no AH or VH. [name] [name] is a 52 y.o. female brought to the ED under IVC by police for homelessness, confusion and possible delusions. Patient has a history of anxiety and poor social situation who has had several recent ED stays for psychiatric and social work issues.  Patient was discharged on 2/27 to jail where she stayed until yesterday.    Patient has chronic knee issues, ambulates with a cane, and complains of chronic knee pain. LCSW will provide patient with [employer], [place] way handout, RTSA resource and bus tickets so patient can follow up with  DSS-Medicaid.    Employment status:  Disabled (Comment on whether or not currently receiving Disability) Insurance information:  Self Pay (Medicaid Pending) PT Recommendations:  Not assessed at this time Information / Referral to community resources:  Shelter, Other (Comment Required)([employer], technical sales)  Patient/Family's Response to care: She is fine just has no where to go  Patient/Family's Understanding of and Emotional Response to Diagnosis, Current Treatment, and Prognosis: She understands she has chronic knee pain  Emotional Assessment Appearance:  Appears stated age Attitude/Demeanor/Rapport:  Apprehensive, Avoidant Affect (typically observed):  Apprehensive, Calm Orientation:  Oriented to Self, Oriented to Place, Oriented to  Time, Oriented to Situation Alcohol / Substance use:  Not Applicable Psych involvement (Current and /or in the community):  No (Comment)(Has not accessed past resources for treatment)  Discharge Needs  Concerns to be addressed:  Basic Needs, Homelessness Readmission within the last 30 days:  Yes Current discharge risk:  Homeless Barriers to Discharge:  Homeless with medical needs, Family Issues   [name], LCSW 04/28/2017, 9:06 AM

## 2017-04-28 NOTE — ED Provider Notes (Signed)
Patient seen by social work, at this point patient has been cleared by IVC and they recommend that he may be discharged to the shelter, but it does not open until 5 PM.  Dr. Alphonzo LemmingsMcshane will follow up, plan is for the patient to be able to be discharged to the local shelter once he becomes open at 5.  Social worker has contacted shelter.   Sharyn CreamerQuale, Seniyah Esker, MD 04/28/17 1524

## 2017-04-28 NOTE — ED Notes (Signed)
[facility] Asp[facility]

## 2017-04-28 NOTE — Progress Notes (Signed)
Called [name] at [facility] and he reported there was no one for intake. LCSW explained patients current situation and he stated he would have someone call me back but that she would be a candidate.  Awaiting call back from [facility]. LCSW spoke to ED RN and ED charge and discussed no guarantee patient will be accepted at shelter. They will send patient by cab voucher.   No further needs- Several resources were discussed with patient and bus ticket and direction to DSS office were provided.  Delta Air LinesClaudine Sherrick Araki LCSW [phone]

## 2017-04-28 NOTE — ED Notes (Signed)
Pt provided meal tray

## 2017-04-28 NOTE — BH Assessment (Signed)
This Clinical research associatewriter received a call from [facility] and she stated SW consult has been put in, she will work on placement for patient and TTS assessment isn't needed.  This Clinical research associatewriter called [facility] Luann and was informed SOC rescinded patient's IVC and a SW consult has been entered.

## 2017-04-29 LAB — GLUCOSE, CAPILLARY
GLUCOSE-CAPILLARY: 313 mg/dL — AB (ref 65–99)
GLUCOSE-CAPILLARY: 72 mg/dL (ref 65–99)
Glucose-Capillary: 219 mg/dL — ABNORMAL HIGH (ref 65–99)
Glucose-Capillary: 366 mg/dL — ABNORMAL HIGH (ref 65–99)

## 2017-04-29 NOTE — Progress Notes (Signed)
LCSW consulted with 3rd shift EDRN and she reported that intake from [facility] was contacted and [facility] is [facility]. EDRN was informed by LCSW  [facility]ained [facility]. [facility]DRN [facility] awaiting call back. [facility] has no beds and common area for night support ( shelter out of the cold) at Full capacity.  LCSW will consult with AD Wandra MannanZack Brooks for this difficult to place patient.   Delta Air LinesClaudine Daquawn Seelman LCSW [phone]

## 2017-04-29 NOTE — ED Notes (Signed)
BEHAVIORAL HEALTH ROUNDING Patient sleeping: Yes.   Patient alert and oriented: not applicable SLEEPING Behavior appropriate: Yes.  ; If no, describe: SLEEPING Nutrition and fluids offered: No SLEEPING Toileting and hygiene offered: NoSLEEPING Sitter present: not applicable, Q 15 min safety rounds and observation. Law enforcement present: Yes ODS 

## 2017-04-29 NOTE — ED Notes (Signed)
BEHAVIORAL HEALTH ROUNDING  Patient sleeping: No.  Patient alert and oriented: yes  Behavior appropriate: Yes. ; If no, describe:  Nutrition and fluids offered: Yes  Toileting and hygiene offered: Yes  Sitter present: not applicable, Q 15 min safety rounds and observation.  Law enforcement present: Yes ODS  

## 2017-04-29 NOTE — Progress Notes (Signed)
L[facility] [place] and left Hippa friendly message to see if they have room in [facility] female bed shelter  To schedule an application appointment please contact our Shelter Department at [phone]. [facility] [address] [place], [place] [address]  Awaiting Call back.   Delta Air LinesClaudine Gesenia Bantz LCSW [phone]

## 2017-04-29 NOTE — ED Notes (Signed)
Hourly rounding reveals patient in room. No complaints, stable, in no acute distress. Q15 minute rounds and monitoring via Rover and Officer to continue.   

## 2017-04-29 NOTE — Progress Notes (Addendum)
LCSW met with patient and explained that she will be provided a cab voucher to either her daughter or to [place] office tommorow. Reviewed the several resources available to her and included additional bus ticket to even  Bring her into [place] to [place], should she choose to go there. [employer] ,[place] IT trainer, legal aid and [facility] for medications was provided. This is the second package LCSW has put together for patient and has been placed in her chart on clip board.  Explained to EDP and Charge nurse that patient is banned from [employer] but has many other opportunities for housing or temporary support.  As per patient her daughter lives on [place] and she could go there but would rather not. LCSW explained this was her own choice.  [employer] LCSW [phone]

## 2017-04-29 NOTE — ED Provider Notes (Signed)
Social worker comes in says that  her boss knows Ms. Ahner well she, has multiple family members that she can stay with, she is fully competent and able to care for herself.  he says she does not need to be here in the hospital. He will pay for a cab to get her to [facility] where DSS is. She needs to follow-up with DSS tomorrow.   Arnaldo NatalMalinda, Isis Costanza F, MD 04/29/17 [phone]

## 2017-04-29 NOTE — ED Notes (Signed)
Report to include Situation, Background, Assessment, and Recommendations [facility] from [facility]. Patient alert and oriented, warm and dry, in no acute distress. Patient denies SI, HI, AVH and pain. Patient made aware of Q15 minute rounds and Psychologist, counsellingover and Officer presence for their safety. Patient instructed to come to me with needs or concerns.

## 2017-04-29 NOTE — ED Notes (Signed)
Awaiting discharge tomorrow  3/11 am per [facility] to [facility]

## 2017-04-29 NOTE — ED Provider Notes (Signed)
-----------------------------------------   7:22 AM on 04/29/2017 -----------------------------------------   Blood pressure (!) 143/90, pulse 80, temperature 98.6 F (37 C), temperature source Oral, resp. rate 16, height 5\' 8"  (1.727 m), weight 83.9 kg (185 lb), SpO2 100 %.  The patient had no acute events since last update.  Calm and cooperative at this time.  Disposition is pending Psychiatry/Behavioral Medicine team recommendations.     Arnaldo NatalMalinda, Saadiq Poche F, MD 04/29/17 [phone]

## 2017-04-29 NOTE — Progress Notes (Signed)
LCSW consulted with Wandra MannanZack Brooks AD of Social work.   Patient is to contact her family and follow through with the several community resources available to her. Cab voucher will be provided.   No further needs  Delta Air LinesClaudine Doloras [place] LCSW [phone]

## 2017-04-29 NOTE — ED Notes (Signed)

## 2017-04-30 NOTE — ED Notes (Signed)
Hourly rounding reveals patient sleeping in room. No complaints, stable, in no acute distress. Q15 minute rounds and monitoring via Rover and Officer to continue.  

## 2017-04-30 NOTE — ED Notes (Signed)
Pt placed in lobby to await taxi. Cab voucher left with first nurse.

## 2017-04-30 NOTE — ED Notes (Signed)
Resumed care from [place], [place]. Pt awake, calm.

## 2017-04-30 NOTE — ED Notes (Signed)
Cab voucher changed to send pt to DSS [address] as there is no dss at [facility]

## 2017-04-30 NOTE — ED Notes (Signed)
Per Marcelino DusterMichelle at [employer] [employer], Ms. Golberg has a [employer] coordinator at [employer]. Her contact information is Kerrin MoChevonne Thompson - [phone]

## 2017-04-30 NOTE — ED Notes (Signed)
Hourly rounding reveals patient in room. No complaints, stable, in no acute distress. Q15 minute rounds and monitoring via Rover and Officer to continue.   

## 2017-04-30 NOTE — ED Provider Notes (Signed)
-----------------------------------------   6:34 AM on 04/30/2017 -----------------------------------------   Blood pressure (!) 140/109, pulse 88, temperature 98.5 F (36.9 C), temperature source Oral, resp. rate 17, height 1.727 m (5\' 8" ), weight 83.9 kg (185 lb), SpO2 100 %.  The patient had no acute events since last update.  Calm and cooperative at this time.  Reportedly disposition will be today for a cab  for the patient to go [facility] where [facility] is located.    Loleta RoseForbach, Jaaziah Schulke, MD 04/30/17 [phone]

## 2017-04-30 NOTE — ED Notes (Signed)
Pt discharged in cab [facility] after verbalizing understanding of discharge instructions; nad noted.

## 2017-08-27 ENCOUNTER — Ambulatory Visit: Payer: Self-pay | Admitting: Orthopedic Surgery

## 2017-09-19 ENCOUNTER — Other Ambulatory Visit: Payer: Self-pay

## 2017-09-19 ENCOUNTER — Encounter
Admission: RE | Admit: 2017-09-19 | Discharge: 2017-09-19 | Disposition: A | Discharge status: Home / Self Care | Payer: Medicaid Other | Source: Ambulatory Visit | Attending: Orthopedic Surgery | Admitting: Orthopedic Surgery

## 2017-09-19 DIAGNOSIS — Z01812 Encounter for preprocedural laboratory examination: Secondary | ICD-10-CM | POA: Diagnosis present

## 2017-09-19 HISTORY — DX: Major depressive disorder, single episode, unspecified: F32.9

## 2017-09-19 HISTORY — DX: Dyspnea, unspecified: R06.00

## 2017-09-19 HISTORY — DX: Depression, unspecified: F32.A

## 2017-09-19 HISTORY — DX: Unspecified urinary incontinence: R32

## 2017-09-19 LAB — TYPE AND SCREEN
ABO/RH(D): A POS
ANTIBODY SCREEN: NEGATIVE

## 2017-09-19 LAB — APTT: APTT: 34 s (ref 24–36)

## 2017-09-19 LAB — SURGICAL PCR SCREEN
MRSA, PCR: NEGATIVE
STAPHYLOCOCCUS AUREUS: POSITIVE — AB

## 2017-09-19 LAB — PROTIME-INR
INR: 1.05
Prothrombin Time: 13.6 seconds (ref 11.4–15.2)

## 2017-09-19 NOTE — Patient Instructions (Signed)
Your procedure is scheduled on: Wednesday, October 03, 2017 Report to Day Surgery [facility]nd [facility]. To find out your arrival time, please call [phone] between 1PM - 3PM on: Tuesday, October 02, 2017  REMEMBER: Instructions that are not followed completely may result in serious medical risk, up to and including death; or upon the discretion of your surgeon and anesthesiologist your surgery may need to be rescheduled.  Do not eat food after midnight the night before surgery.  No gum chewing, lozengers or hard candies.  You may however, drink water up to 2 hours before you are scheduled to arrive for your surgery. Do not drink anything within 2 hours of the start of your surgery.  No Alcohol for 24 hours before or after surgery.  No Smoking including e-cigarettes for 24 hours prior to surgery.  No chewable tobacco products for at least 6 hours prior to surgery.  No nicotine patches on the day of surgery.  On the morning of surgery brush your teeth with toothpaste and water, you may rinse your mouth with mouthwash if you wish. Do not swallow any toothpaste or mouthwash.  Notify your doctor if there is any change in your medical condition (cold, fever, infection).  Do not wear jewelry, make-up, hairpins, clips or nail polish.  Do not wear lotions, powders, or perfumes. You may wear deodorant.  Do not shave 48 hours prior to surgery.   Contacts and dentures may not be worn into surgery.  Do not bring valuables to the hospital, including drivers license, insurance or credit cards.  [facility] is not responsible for any belongings or valuables.   TAKE THESE MEDICATIONS THE MORNING OF SURGERY:  1.  Amlodipine 2.  Bupropion 3.  advair inhaler 4.  Albuterol inhaler  Use CHG Soap as directed on instruction sheet.  Use inhalers on the day of surgery and bring to the hospital.  Stop Metformin 2 days prior to surgery. Last day to take is Sunday, August 11.  Resume after surgery.  ON AUGUST 9 - Stop Anti-inflammatories (NSAIDS) such as MELOXICAM, Advil, Aleve, Ibuprofen, Motrin, Naproxen, Naprosyn and Aspirin based products such as Excedrin, Goodys Powder, BC Powder. (May take Tylenol or Acetaminophen if needed.)  ON AUGUST 9 - Stop ANY OVER THE COUNTER supplements until after surgery. (May continue Vitamin D.)  Wear comfortable clothing (specific to your surgery type) to the hospital.  Plan for stool softeners for home use.  If you are being admitted to the hospital overnight, leave your suitcase in the car. After surgery it may be brought to your room.  Please call [phone] if you have any questions about these instructions.

## 2017-09-19 NOTE — Pre-Procedure Instructions (Signed)
Written and verbal instructions given on incentive spirometry use; acknowledged understanding.

## 2017-09-19 NOTE — Pre-Procedure Instructions (Signed)
Was unable to void during PAT visit. Was instructed; along with the care staff that the urine is needed prior to having surgery; acknowledged understanding and will bring in a specimen as soon as possible.

## 2017-09-21 ENCOUNTER — Encounter
Admission: RE | Admit: 2017-09-21 | Discharge: 2017-09-21 | Disposition: A | Discharge status: Home / Self Care | Payer: Medicaid Other | Source: Ambulatory Visit | Attending: Orthopedic Surgery | Admitting: Orthopedic Surgery

## 2017-09-21 DIAGNOSIS — Z01812 Encounter for preprocedural laboratory examination: Secondary | ICD-10-CM | POA: Insufficient documentation

## 2017-09-21 LAB — URINALYSIS, ROUTINE W REFLEX MICROSCOPIC
BILIRUBIN URINE: NEGATIVE
Glucose, UA: NEGATIVE mg/dL
Ketones, ur: NEGATIVE mg/dL
NITRITE: NEGATIVE
PH: 6 (ref 5.0–8.0)
Protein, ur: NEGATIVE mg/dL
SPECIFIC GRAVITY, URINE: 1.009 (ref 1.005–1.030)
Squamous Epithelial / LPF: >50 — ABNORMAL HIGH (ref 0–5)

## 2017-09-24 NOTE — Pre-Procedure Instructions (Signed)
UA FAXED TO DR Odis LusterBOWERS

## 2017-10-02 MED ORDER — SODIUM CHLORIDE 0.9 % IV SOLN
2000.0000 mg | INTRAVENOUS | Status: DC
  Filled 2017-10-02: qty 20

## 2017-10-02 MED ORDER — SODIUM CHLORIDE 0.9 % IV SOLN
1000.0000 mg | INTRAVENOUS | Status: AC
  Administered 2017-10-03: 1000 mg via INTRAVENOUS
  Filled 2017-10-02: qty 1100

## 2017-10-02 MED ORDER — CEFAZOLIN SODIUM-DEXTROSE 2-4 GM/100ML-% IV SOLN
2.0000 g | INTRAVENOUS | Status: AC
  Administered 2017-10-03: 2 g via INTRAVENOUS

## 2017-10-03 ENCOUNTER — Inpatient Hospital Stay: Admission: RE | Admit: 2017-10-03 | Payer: Medicaid Other | Source: Ambulatory Visit | Admitting: Orthopedic Surgery

## 2017-10-03 ENCOUNTER — Encounter
Admission: RE | Disposition: A | Discharge status: Home / Self Care | Payer: Self-pay | Source: Ambulatory Visit | Attending: Orthopedic Surgery

## 2017-10-03 ENCOUNTER — Encounter: Admission: RE | Payer: Self-pay | Source: Ambulatory Visit

## 2017-10-03 ENCOUNTER — Inpatient Hospital Stay: Payer: Medicaid Other | Admitting: Certified Registered Nurse Anesthetist

## 2017-10-03 ENCOUNTER — Inpatient Hospital Stay: Payer: Medicaid Other

## 2017-10-03 ENCOUNTER — Inpatient Hospital Stay
Admission: RE | Admit: 2017-10-03 | Discharge: 2017-10-05 | DRG: 470 | Disposition: A | Discharge status: Home / Self Care | Payer: Medicaid Other | Source: Ambulatory Visit | Attending: Orthopedic Surgery | Admitting: Orthopedic Surgery

## 2017-10-03 ENCOUNTER — Other Ambulatory Visit: Payer: Self-pay

## 2017-10-03 ENCOUNTER — Encounter: Payer: Self-pay | Admitting: Emergency Medicine

## 2017-10-03 DIAGNOSIS — F419 Anxiety disorder, unspecified: Secondary | ICD-10-CM | POA: Diagnosis present

## 2017-10-03 DIAGNOSIS — E119 Type 2 diabetes mellitus without complications: Secondary | ICD-10-CM | POA: Diagnosis present

## 2017-10-03 DIAGNOSIS — F17210 Nicotine dependence, cigarettes, uncomplicated: Secondary | ICD-10-CM | POA: Diagnosis present

## 2017-10-03 DIAGNOSIS — M1711 Unilateral primary osteoarthritis, right knee: Secondary | ICD-10-CM | POA: Diagnosis present

## 2017-10-03 DIAGNOSIS — I10 Essential (primary) hypertension: Secondary | ICD-10-CM | POA: Diagnosis present

## 2017-10-03 DIAGNOSIS — Z7951 Long term (current) use of inhaled steroids: Secondary | ICD-10-CM

## 2017-10-03 DIAGNOSIS — Z8673 Personal history of transient ischemic attack (TIA), and cerebral infarction without residual deficits: Secondary | ICD-10-CM | POA: Diagnosis not present

## 2017-10-03 DIAGNOSIS — F329 Major depressive disorder, single episode, unspecified: Secondary | ICD-10-CM | POA: Diagnosis present

## 2017-10-03 DIAGNOSIS — Z79899 Other long term (current) drug therapy: Secondary | ICD-10-CM | POA: Diagnosis not present

## 2017-10-03 DIAGNOSIS — Z09 Encounter for follow-up examination after completed treatment for conditions other than malignant neoplasm: Secondary | ICD-10-CM

## 2017-10-03 DIAGNOSIS — Z791 Long term (current) use of non-steroidal anti-inflammatories (NSAID): Secondary | ICD-10-CM | POA: Diagnosis not present

## 2017-10-03 DIAGNOSIS — Z96651 Presence of right artificial knee joint: Secondary | ICD-10-CM

## 2017-10-03 HISTORY — PX: TOTAL KNEE ARTHROPLASTY: SHX125

## 2017-10-03 LAB — CBC WITH DIFFERENTIAL/PLATELET
BASOS PCT: 1 %
Basophils Absolute: 0 10*3/uL (ref 0–0.1)
Eosinophils Absolute: 0.2 10*3/uL (ref 0–0.7)
Eosinophils Relative: 3 %
HEMATOCRIT: 38.2 % (ref 35.0–47.0)
HEMOGLOBIN: 12.9 g/dL (ref 12.0–16.0)
LYMPHS ABS: 2.5 10*3/uL (ref 1.0–3.6)
Lymphocytes Relative: 40 %
MCH: 31.9 pg (ref 26.0–34.0)
MCHC: 33.8 g/dL (ref 32.0–36.0)
MCV: 94.4 fL (ref 80.0–100.0)
MONO ABS: 0.6 10*3/uL (ref 0.2–0.9)
MONOS PCT: 10 %
NEUTROS ABS: 2.9 10*3/uL (ref 1.4–6.5)
Neutrophils Relative %: 46 %
Platelets: 271 10*3/uL (ref 150–440)
RBC: 4.04 MIL/uL (ref 3.80–5.20)
RDW: 13 % (ref 11.5–14.5)
WBC: 6.3 10*3/uL (ref 3.6–11.0)

## 2017-10-03 LAB — POCT PREGNANCY, URINE: Preg Test, Ur: NEGATIVE

## 2017-10-03 LAB — GLUCOSE, CAPILLARY
GLUCOSE-CAPILLARY: 106 mg/dL — AB (ref 70–99)
Glucose-Capillary: 80 mg/dL (ref 70–99)

## 2017-10-03 SURGERY — ARTHROPLASTY, KNEE, TOTAL
Anesthesia: Choice | Site: Knee | Laterality: Right | Wound class: Clean

## 2017-10-03 SURGERY — ARTHROPLASTY, KNEE, TOTAL
Anesthesia: Choice | Laterality: Right

## 2017-10-03 MED ORDER — PROPOFOL 500 MG/50ML IV EMUL
INTRAVENOUS | Status: AC
  Filled 2017-10-03: qty 50

## 2017-10-03 MED ORDER — TRAMADOL HCL 50 MG PO TABS
50.0000 mg | ORAL_TABLET | Freq: Four times a day (QID) | ORAL | Status: DC
  Administered 2017-10-04 – 2017-10-05 (×6): 50 mg via ORAL
  Filled 2017-10-03 (×7): qty 1

## 2017-10-03 MED ORDER — GABAPENTIN 300 MG PO CAPS
300.0000 mg | ORAL_CAPSULE | Freq: Once | ORAL | Status: AC
  Administered 2017-10-03: 300 mg via ORAL

## 2017-10-03 MED ORDER — LACTATED RINGERS IV SOLN
INTRAVENOUS | Status: DC
  Administered 2017-10-03: 18:00:00 via INTRAVENOUS

## 2017-10-03 MED ORDER — TRANEXAMIC ACID 1000 MG/10ML IV SOLN
INTRAVENOUS | Status: AC | PRN
  Administered 2017-10-03: 2000 mg via INTRAVENOUS

## 2017-10-03 MED ORDER — BUPROPION HCL ER (XL) 150 MG PO TB24
150.0000 mg | ORAL_TABLET | Freq: Every day | ORAL | Status: DC
  Administered 2017-10-04 – 2017-10-05 (×2): 150 mg via ORAL
  Filled 2017-10-03 (×2): qty 1

## 2017-10-03 MED ORDER — GABAPENTIN 300 MG PO CAPS
300.0000 mg | ORAL_CAPSULE | Freq: Three times a day (TID) | ORAL | Status: DC
  Administered 2017-10-03 – 2017-10-05 (×5): 300 mg via ORAL
  Filled 2017-10-03 (×5): qty 1

## 2017-10-03 MED ORDER — FAMOTIDINE 20 MG PO TABS
20.0000 mg | ORAL_TABLET | Freq: Once | ORAL | Status: AC
  Administered 2017-10-03: 20 mg via ORAL

## 2017-10-03 MED ORDER — BUPIVACAINE HCL (PF) 0.5 % IJ SOLN
INTRAMUSCULAR | Status: AC
  Filled 2017-10-03: qty 10

## 2017-10-03 MED ORDER — SODIUM CHLORIDE 0.9 % IV SOLN
INTRAVENOUS | Status: DC | PRN
  Administered 2017-10-03: 60 mL

## 2017-10-03 MED ORDER — FENTANYL CITRATE (PF) 100 MCG/2ML IJ SOLN
INTRAMUSCULAR | Status: AC
  Filled 2017-10-03: qty 2

## 2017-10-03 MED ORDER — ONDANSETRON HCL 4 MG/2ML IJ SOLN: 4.0000 mg | Freq: Four times a day (QID) | INTRAMUSCULAR | Status: DC | PRN

## 2017-10-03 MED ORDER — DOCUSATE SODIUM 100 MG PO CAPS
100.0000 mg | ORAL_CAPSULE | Freq: Two times a day (BID) | ORAL | Status: DC
  Administered 2017-10-03 – 2017-10-05 (×4): 100 mg via ORAL
  Filled 2017-10-03 (×4): qty 1

## 2017-10-03 MED ORDER — ACETAMINOPHEN 500 MG PO TABS
500.0000 mg | ORAL_TABLET | Freq: Four times a day (QID) | ORAL | Status: AC
  Administered 2017-10-03 – 2017-10-04 (×4): 500 mg via ORAL
  Filled 2017-10-03 (×5): qty 1

## 2017-10-03 MED ORDER — ONDANSETRON HCL 4 MG PO TABS: 4.0000 mg | ORAL_TABLET | Freq: Four times a day (QID) | ORAL | Status: DC | PRN

## 2017-10-03 MED ORDER — LIDOCAINE HCL (PF) 1 % IJ SOLN
INTRAMUSCULAR | Status: AC
  Filled 2017-10-03: qty 5

## 2017-10-03 MED ORDER — MAGNESIUM HYDROXIDE 400 MG/5ML PO SUSP
30.0000 mL | Freq: Every day | ORAL | Status: DC | PRN
  Administered 2017-10-04: 30 mL via ORAL
  Filled 2017-10-03: qty 30

## 2017-10-03 MED ORDER — HYDROCODONE-ACETAMINOPHEN 7.5-325 MG PO TABS
1.0000 | ORAL_TABLET | ORAL | Status: DC | PRN
  Administered 2017-10-04: 1 via ORAL
  Administered 2017-10-05: 2 via ORAL
  Filled 2017-10-03: qty 1
  Filled 2017-10-03: qty 2

## 2017-10-03 MED ORDER — KETOROLAC TROMETHAMINE 15 MG/ML IJ SOLN
15.0000 mg | Freq: Four times a day (QID) | INTRAMUSCULAR | Status: AC
  Administered 2017-10-03 – 2017-10-04 (×4): 15 mg via INTRAVENOUS
  Filled 2017-10-03 (×5): qty 1

## 2017-10-03 MED ORDER — ACETAMINOPHEN 325 MG PO TABS: 325.0000 mg | ORAL_TABLET | Freq: Four times a day (QID) | ORAL | Status: DC | PRN

## 2017-10-03 MED ORDER — CEFAZOLIN SODIUM-DEXTROSE 2-4 GM/100ML-% IV SOLN
INTRAVENOUS | Status: AC
  Filled 2017-10-03: qty 100

## 2017-10-03 MED ORDER — SODIUM CHLORIDE 0.9 % IV SOLN
INTRAVENOUS | Status: DC | PRN
  Administered 2017-10-03: 30 ug/min via INTRAVENOUS

## 2017-10-03 MED ORDER — MORPHINE SULFATE (PF) 2 MG/ML IV SOLN: 0.5000 mg | INTRAVENOUS | Status: DC | PRN

## 2017-10-03 MED ORDER — BUPIVACAINE-EPINEPHRINE (PF) 0.5% -1:200000 IJ SOLN
INTRAMUSCULAR | Status: DC | PRN
  Administered 2017-10-03: 30 mL

## 2017-10-03 MED ORDER — FAMOTIDINE 20 MG PO TABS
ORAL_TABLET | ORAL | Status: AC
  Administered 2017-10-03: 20 mg via ORAL
  Filled 2017-10-03: qty 1

## 2017-10-03 MED ORDER — BISACODYL 10 MG RE SUPP: 10.0000 mg | Freq: Every day | RECTAL | Status: DC | PRN

## 2017-10-03 MED ORDER — FENTANYL CITRATE (PF) 100 MCG/2ML IJ SOLN: 25.0000 ug | INTRAMUSCULAR | Status: DC | PRN

## 2017-10-03 MED ORDER — TETRACAINE HCL 1 % IJ SOLN
INTRAMUSCULAR | Status: DC | PRN
  Administered 2017-10-03: 3 mg via INTRASPINAL

## 2017-10-03 MED ORDER — ACETAMINOPHEN 500 MG PO TABS
ORAL_TABLET | ORAL | Status: AC
  Administered 2017-10-03: 1000 mg via ORAL
  Filled 2017-10-03: qty 2

## 2017-10-03 MED ORDER — ACETAMINOPHEN 500 MG PO TABS
1000.0000 mg | ORAL_TABLET | Freq: Once | ORAL | Status: AC
  Administered 2017-10-03: 1000 mg via ORAL

## 2017-10-03 MED ORDER — ALBUTEROL SULFATE (2.5 MG/3ML) 0.083% IN NEBU
3.0000 mL | INHALATION_SOLUTION | RESPIRATORY_TRACT | Status: DC | PRN
Start: 2017-10-03 — End: 2017-10-05

## 2017-10-03 MED ORDER — PROPOFOL 500 MG/50ML IV EMUL
INTRAVENOUS | Status: DC | PRN
  Administered 2017-10-03: 50 ug/kg/min via INTRAVENOUS

## 2017-10-03 MED ORDER — VITAMIN D 1000 UNITS PO TABS
1000.0000 [IU] | ORAL_TABLET | Freq: Every day | ORAL | Status: DC
  Administered 2017-10-04 – 2017-10-05 (×2): 1000 [IU] via ORAL
  Filled 2017-10-03 (×2): qty 1

## 2017-10-03 MED ORDER — SODIUM CHLORIDE FLUSH 0.9 % IV SOLN
INTRAVENOUS | Status: AC
  Filled 2017-10-03: qty 60

## 2017-10-03 MED ORDER — BUPIVACAINE HCL (PF) 0.5 % IJ SOLN
INTRAMUSCULAR | Status: DC | PRN
  Administered 2017-10-03: 2.7 mL

## 2017-10-03 MED ORDER — MAGNESIUM CITRATE PO SOLN
1.0000 | Freq: Once | ORAL | Status: DC | PRN
  Filled 2017-10-03: qty 296

## 2017-10-03 MED ORDER — ASPIRIN 81 MG PO CHEW
81.0000 mg | CHEWABLE_TABLET | Freq: Two times a day (BID) | ORAL | Status: DC
  Administered 2017-10-04 – 2017-10-05 (×3): 81 mg via ORAL
  Filled 2017-10-03 (×3): qty 1

## 2017-10-03 MED ORDER — ONDANSETRON HCL 4 MG/2ML IJ SOLN
INTRAMUSCULAR | Status: AC
  Filled 2017-10-03: qty 2

## 2017-10-03 MED ORDER — CHLORHEXIDINE GLUCONATE 4 % EX LIQD: 60.0000 mL | Freq: Once | CUTANEOUS | Status: DC

## 2017-10-03 MED ORDER — ATORVASTATIN CALCIUM 20 MG PO TABS
40.0000 mg | ORAL_TABLET | Freq: Every day | ORAL | Status: DC
  Administered 2017-10-03 – 2017-10-04 (×2): 40 mg via ORAL
  Filled 2017-10-03 (×2): qty 2

## 2017-10-03 MED ORDER — ONDANSETRON HCL 4 MG/2ML IJ SOLN: 4.0000 mg | Freq: Once | INTRAMUSCULAR | Status: DC | PRN

## 2017-10-03 MED ORDER — METFORMIN HCL ER 500 MG PO TB24
1000.0000 mg | ORAL_TABLET | Freq: Two times a day (BID) | ORAL | Status: DC
  Administered 2017-10-03 – 2017-10-05 (×4): 1000 mg via ORAL
  Filled 2017-10-03 (×5): qty 2

## 2017-10-03 MED ORDER — MIDAZOLAM HCL 2 MG/2ML IJ SOLN
INTRAMUSCULAR | Status: AC
  Administered 2017-10-03: 1 mg via INTRAVENOUS
  Filled 2017-10-03: qty 2

## 2017-10-03 MED ORDER — INSULIN ASPART 100 UNIT/ML ~~LOC~~ SOLN
0.0000 [IU] | Freq: Three times a day (TID) | SUBCUTANEOUS | Status: DC
  Administered 2017-10-04: 2 [IU] via SUBCUTANEOUS
  Administered 2017-10-04: 3 [IU] via SUBCUTANEOUS
  Administered 2017-10-04: 5 [IU] via SUBCUTANEOUS
  Administered 2017-10-05: 3 [IU] via SUBCUTANEOUS
  Administered 2017-10-05: 5 [IU] via SUBCUTANEOUS
  Filled 2017-10-03 (×5): qty 1

## 2017-10-03 MED ORDER — ROPIVACAINE HCL 5 MG/ML IJ SOLN: INTRAMUSCULAR | Status: DC | PRN

## 2017-10-03 MED ORDER — METOCLOPRAMIDE HCL 10 MG PO TABS: 5.0000 mg | ORAL_TABLET | Freq: Three times a day (TID) | ORAL | Status: DC | PRN

## 2017-10-03 MED ORDER — AMLODIPINE BESYLATE 5 MG PO TABS
5.0000 mg | ORAL_TABLET | Freq: Every day | ORAL | Status: DC
  Administered 2017-10-04 – 2017-10-05 (×2): 5 mg via ORAL
  Filled 2017-10-03 (×2): qty 1

## 2017-10-03 MED ORDER — ARTIFICIAL TEARS OPHTHALMIC OINT
TOPICAL_OINTMENT | OPHTHALMIC | Status: AC
  Filled 2017-10-03: qty 3.5

## 2017-10-03 MED ORDER — BACITRACIN 50000 UNITS IM SOLR
INTRAMUSCULAR | Status: AC
  Filled 2017-10-03: qty 2

## 2017-10-03 MED ORDER — PHENYLEPHRINE HCL 10 MG/ML IJ SOLN
INTRAMUSCULAR | Status: AC
  Filled 2017-10-03: qty 1

## 2017-10-03 MED ORDER — FENTANYL CITRATE (PF) 100 MCG/2ML IJ SOLN
INTRAMUSCULAR | Status: AC
  Administered 2017-10-03: 50 ug via INTRAVENOUS
  Filled 2017-10-03: qty 2

## 2017-10-03 MED ORDER — MENTHOL 3 MG MT LOZG
1.0000 | LOZENGE | OROMUCOSAL | Status: DC | PRN
  Filled 2017-10-03: qty 9

## 2017-10-03 MED ORDER — MOMETASONE FURO-FORMOTEROL FUM 100-5 MCG/ACT IN AERO
2.0000 | INHALATION_SPRAY | Freq: Two times a day (BID) | RESPIRATORY_TRACT | Status: DC
  Administered 2017-10-03 – 2017-10-05 (×4): 2 via RESPIRATORY_TRACT
  Filled 2017-10-03: qty 8.8

## 2017-10-03 MED ORDER — ROPIVACAINE HCL 5 MG/ML IJ SOLN
INTRAMUSCULAR | Status: AC
  Filled 2017-10-03: qty 30

## 2017-10-03 MED ORDER — CEFAZOLIN SODIUM-DEXTROSE 2-4 GM/100ML-% IV SOLN
INTRAVENOUS | Status: AC
  Administered 2017-10-03: 2 g via INTRAVENOUS
  Filled 2017-10-03: qty 100

## 2017-10-03 MED ORDER — FENTANYL CITRATE (PF) 100 MCG/2ML IJ SOLN
INTRAMUSCULAR | Status: DC | PRN
  Administered 2017-10-03: 25 ug via INTRAVENOUS

## 2017-10-03 MED ORDER — BUPIVACAINE-EPINEPHRINE (PF) 0.5% -1:200000 IJ SOLN
INTRAMUSCULAR | Status: AC
  Filled 2017-10-03: qty 30

## 2017-10-03 MED ORDER — ROPIVACAINE HCL 5 MG/ML IJ SOLN
INTRAMUSCULAR | Status: DC | PRN
  Administered 2017-10-03: 30 mL via EPIDURAL

## 2017-10-03 MED ORDER — ONDANSETRON HCL 4 MG/2ML IJ SOLN
INTRAMUSCULAR | Status: DC | PRN
  Administered 2017-10-03: 4 mg via INTRAVENOUS

## 2017-10-03 MED ORDER — EPINEPHRINE PF 1 MG/ML IJ SOLN
INTRAMUSCULAR | Status: AC
  Filled 2017-10-03: qty 1

## 2017-10-03 MED ORDER — METOCLOPRAMIDE HCL 5 MG/ML IJ SOLN: 5.0000 mg | Freq: Three times a day (TID) | INTRAMUSCULAR | Status: DC | PRN

## 2017-10-03 MED ORDER — LISINOPRIL 20 MG PO TABS
20.0000 mg | ORAL_TABLET | Freq: Every day | ORAL | Status: DC
  Administered 2017-10-04: 20 mg via ORAL
  Filled 2017-10-03 (×2): qty 1

## 2017-10-03 MED ORDER — BUPIVACAINE LIPOSOME 1.3 % IJ SUSP
INTRAMUSCULAR | Status: AC
  Filled 2017-10-03: qty 20

## 2017-10-03 MED ORDER — MIDAZOLAM HCL 2 MG/2ML IJ SOLN
1.0000 mg | Freq: Once | INTRAMUSCULAR | Status: AC
  Administered 2017-10-03: 1 mg via INTRAVENOUS

## 2017-10-03 MED ORDER — MIDAZOLAM HCL 2 MG/2ML IJ SOLN
INTRAMUSCULAR | Status: AC
  Filled 2017-10-03: qty 2

## 2017-10-03 MED ORDER — SODIUM CHLORIDE 0.9 % IV SOLN
INTRAVENOUS | Status: DC
  Administered 2017-10-03 (×2): via INTRAVENOUS

## 2017-10-03 MED ORDER — GABAPENTIN 300 MG PO CAPS
ORAL_CAPSULE | ORAL | Status: AC
  Administered 2017-10-03: 300 mg via ORAL
  Filled 2017-10-03: qty 1

## 2017-10-03 MED ORDER — CEFAZOLIN SODIUM-DEXTROSE 2-4 GM/100ML-% IV SOLN
2.0000 g | Freq: Four times a day (QID) | INTRAVENOUS | Status: AC
  Administered 2017-10-03 – 2017-10-04 (×2): 2 g via INTRAVENOUS
  Filled 2017-10-03: qty 100

## 2017-10-03 MED ORDER — FUROSEMIDE 20 MG PO TABS
20.0000 mg | ORAL_TABLET | Freq: Every day | ORAL | Status: DC
  Administered 2017-10-04 – 2017-10-05 (×2): 20 mg via ORAL
  Filled 2017-10-03 (×2): qty 1

## 2017-10-03 MED ORDER — PHENOL 1.4 % MT LIQD
1.0000 | OROMUCOSAL | Status: DC | PRN
  Filled 2017-10-03: qty 177

## 2017-10-03 MED ORDER — FENTANYL CITRATE (PF) 100 MCG/2ML IJ SOLN
50.0000 ug | Freq: Once | INTRAMUSCULAR | Status: AC
  Administered 2017-10-03: 50 ug via INTRAVENOUS

## 2017-10-03 MED ORDER — HYDROCODONE-ACETAMINOPHEN 5-325 MG PO TABS
1.0000 | ORAL_TABLET | ORAL | Status: DC | PRN
  Administered 2017-10-05 (×2): 2 via ORAL
  Filled 2017-10-03 (×2): qty 2

## 2017-10-03 SURGICAL SUPPLY — 56 items
BLADE SAW 1 (BLADE) ×3 IMPLANT
BLADE SAW 18WX90L 1.27 THK (BLADE) ×3 IMPLANT
BLADE SAW SAG 25X90X1.19 (BLADE) ×3 IMPLANT
BOWL CEMENT MIX W/ADAPTER (MISCELLANEOUS) ×3 IMPLANT
BRUSH SCRUB EZ  4% CHG (MISCELLANEOUS) ×4
BRUSH SCRUB EZ 4% CHG (MISCELLANEOUS) ×2 IMPLANT
CANISTER SUCT 1200ML W/VALVE (MISCELLANEOUS) ×3 IMPLANT
CANISTER SUCT 3000ML PPV (MISCELLANEOUS) ×6 IMPLANT
CEMENT BONE 1-PACK (Cement) ×6 IMPLANT
CHLORAPREP W/TINT 26ML (MISCELLANEOUS) ×6 IMPLANT
COMP PATELLA FENESIS 32 OVAL (Stem) ×3 IMPLANT
COMPONENT FEM OXINIUM SZ 6 RT (Knees) ×3 IMPLANT
COMPONENT PTLLA GENS 32 OVAL (Stem) ×1 IMPLANT
COMPONENT TIBIA RIGHT SZ 4 (Knees) ×1 IMPLANT
COOLER POLAR GLACIER W/PUMP (MISCELLANEOUS) ×3 IMPLANT
CUFF TOURN 30 STER DUAL PORT (MISCELLANEOUS) ×3 IMPLANT
DRAPE INCISE IOBAN 66X60 STRL (DRAPES) ×3 IMPLANT
DRAPE SHEET LG 3/4 BI-LAMINATE (DRAPES) ×3 IMPLANT
DRSG AQUACEL AG ADV 3.5X14 (GAUZE/BANDAGES/DRESSINGS) ×3 IMPLANT
ELECT REM PT RETURN 9FT ADLT (ELECTROSURGICAL) ×3
ELECTRODE REM PT RTRN 9FT ADLT (ELECTROSURGICAL) ×1 IMPLANT
GAUZE PETRO XEROFOAM 1X8 (MISCELLANEOUS) ×3 IMPLANT
GLOVE INDICATOR 8.0 STRL GRN (GLOVE) ×3 IMPLANT
GLOVE SURG ORTHO 8.0 STRL STRW (GLOVE) ×6 IMPLANT
GOWN STRL REUS W/ TWL LRG LVL3 (GOWN DISPOSABLE) ×2 IMPLANT
GOWN STRL REUS W/ TWL XL LVL3 (GOWN DISPOSABLE) ×1 IMPLANT
GOWN STRL REUS W/TWL LRG LVL3 (GOWN DISPOSABLE) ×4
GOWN STRL REUS W/TWL XL LVL3 (GOWN DISPOSABLE) ×2
HOOD PEEL AWAY FLYTE STAYCOOL (MISCELLANEOUS) ×9 IMPLANT
INSERT ARTI HI FLEX 9 SZ 3-4 (Insert) ×3 IMPLANT
IV NS 1000ML (IV SOLUTION) ×2
IV NS 1000ML BAXH (IV SOLUTION) ×1 IMPLANT
KIT TURNOVER KIT A (KITS) ×3 IMPLANT
MAT ABSORB  FLUID 56X50 GRAY (MISCELLANEOUS) ×2
MAT ABSORB FLUID 56X50 GRAY (MISCELLANEOUS) ×1 IMPLANT
NDL SAFETY ECLIPSE 18X1.5 (NEEDLE) ×1 IMPLANT
NEEDLE HYPO 18GX1.5 SHARP (NEEDLE) ×2
NEEDLE SPNL 20GX3.5 QUINCKE YW (NEEDLE) ×3 IMPLANT
NS IRRIG 1000ML POUR BTL (IV SOLUTION) ×3 IMPLANT
PACK TOTAL KNEE (MISCELLANEOUS) ×3 IMPLANT
PAD DE MAYO PRESSURE PROTECT (MISCELLANEOUS) ×3 IMPLANT
PAD WRAPON POLAR KNEE (MISCELLANEOUS) ×1 IMPLANT
PULSAVAC PLUS IRRIG FAN TIP (DISPOSABLE) ×3
STAPLER SKIN PROX 35W (STAPLE) ×3 IMPLANT
SUCTION FRAZIER HANDLE 10FR (MISCELLANEOUS) ×2
SUCTION TUBE FRAZIER 10FR DISP (MISCELLANEOUS) ×1 IMPLANT
SUT DVC 2 QUILL PDO  T11 36X36 (SUTURE) ×2
SUT DVC 2 QUILL PDO T11 36X36 (SUTURE) ×1 IMPLANT
SUT VIC AB 2-0 CT1 18 (SUTURE) ×3 IMPLANT
SUT VIC AB 2-0 CT1 27 (SUTURE) ×2
SUT VIC AB 2-0 CT1 TAPERPNT 27 (SUTURE) ×1 IMPLANT
SUT VIC AB PLUS 45CM 1-MO-4 (SUTURE) ×3 IMPLANT
SYR 30ML LL (SYRINGE) ×6 IMPLANT
TIBIA RIGHT SZ 4 (Knees) ×3 IMPLANT
TIP FAN IRRIG PULSAVAC PLUS (DISPOSABLE) ×1 IMPLANT
WRAPON POLAR PAD KNEE (MISCELLANEOUS) ×3

## 2017-10-03 NOTE — Anesthesia Post-op Follow-up Note (Signed)
Anesthesia QCDR form completed.        

## 2017-10-03 NOTE — Anesthesia Procedure Notes (Addendum)
Anesthesia Regional Block: Adductor canal block   Pre-Anesthetic Checklist: ,, timeout performed, Correct Patient, Correct Site, Correct Laterality, Correct Procedure, Correct Position, site marked, Risks and benefits discussed,  Surgical consent,  Pre-op evaluation,  At surgeon's request and post-op pain management  Laterality: Right  Prep: chloraprep, alcohol swabs       Needles:  Injection technique: Single-shot  Needle Type: Stimiplex      Needle Gauge: 21     Additional Needles:   Procedures:,,,, ultrasound used (permanent image in chart),,,,  Narrative:  Start time: 10/03/2017 10:00 AM End time: 10/03/2017 10:10 AM Injection made incrementally with aspirations every 5 mL.  Additional Notes: Time out called.  A roll was placed under the right hip.  The right thigh was prepped and draped in sterile fashion.  A skin wheal was made lateral to the US probe with 1% Lidocaine plain.  A 21G stimuplex needle was advanced lateral to the artery and incremental injection of 30 cc of 0.5% Ropivacaine plain, after negative aspirations.  The patient tolerated the procedure well.

## 2017-10-03 NOTE — Anesthesia Procedure Notes (Addendum)
Spinal  Patient location during procedure: OR Start time: 10/03/2017 10:40 AM End time: 10/03/2017 10:52 AM Staffing Anesthesiologist: Alvin Critchley, MD Performed: anesthesiologist  Preanesthetic Checklist Completed: patient identified, site marked, surgical consent, pre-op evaluation, timeout performed, IV checked, risks and benefits discussed and monitors and equipment checked Spinal Block Patient position: sitting Prep: Betadine Patient monitoring: heart rate, continuous pulse ox, blood pressure and cardiac monitor Approach: midline Location: L4-5 Injection technique: single-shot Needle Needle type: Whitacre and Introducer  Needle gauge: 24 G Needle length: 9 cm Additional Notes Negative paresthesia. Negative blood return. Positive free-flowing CSF. Expiration date of kit checked and confirmed. Patient tolerated procedure well, without complications.

## 2017-10-03 NOTE — Anesthesia Procedure Notes (Signed)
Procedure Name: MAC Date/Time: 10/03/2017 10:35 AM Performed by: Alvin Critchley, MD Patient Re-evaluated:Patient Re-evaluated prior to induction Oxygen Delivery Method: Simple face mask

## 2017-10-03 NOTE — Op Note (Signed)
DATE OF SURGERY:  10/03/2017 TIME: 1:04 PM  PATIENT NAME:  [name] [name]   AGE: 52 y.o.    PRE-OPERATIVE DIAGNOSIS:  OSTEOARTHRITIS OF RIGHT KNEE  POST-OPERATIVE DIAGNOSIS:  Same  PROCEDURE:  Procedure(s): TOTAL KNEE ARTHROPLASTY, Right  SURGEON:  Lyndle HerrlichJames R Delmo Matty, MD   ASSISTANT:  Altamese CabalMaurice Jones, PA-C  OPERATIVE IMPLANTS: Katrinka BlazingSmith & Nephew, Cruciate Retaining Oxinium Femoral component size  6, Fixed Bearing Tray size 4, Patella polyethylene 3-peg oval button size 32 mm, with a 9 mm HighFlex insert.   PREOPERATIVE INDICATIONS:  [name] [name] is an 52 y.o. female who has a diagnosis of OSTEOARTHRITIS OF RIGHT KNEE and elected for a right total knee arthroplasty after failing nonoperative treatment, including activity modification, pain medication, physical therapy and injections who has significant impairment of their activities of daily living.  Radiographs have demonstrated tricompartmental osteoarthritis joint space narrowing, osteophytes, subchondral sclerosis and cyst formation.  The risks, benefits, and alternatives were discussed at length including but not limited to the risks of infection, bleeding, nerve or blood vessel injury, knee stiffness, fracture, dislocation, loosening or failure of the hardware and the need for further surgery. Medical risks include but not limited to DVT and pulmonary embolism, myocardial infarction, stroke, pneumonia, respiratory failure and death. I discussed these risks with the patient in my office prior to the date of surgery. They understood these risks and were willing to proceed.  OPERATIVE FINDINGS AND UNIQUE ASPECTS OF THE CASE:  All three compartments with advanced and severe degenerative changes, large osteophytes and an abundance of synovial fluid. Significant deformity was also noted. A decision was made to proceed with total knee arthroplasty.   OPERATIVE DESCRIPTION:  The patient was brought to the operative room and placed in a  supine position after undergoing placement of a general anesthetic. IV antibiotics were given. Patient received tranexamic acid. The lower extremity was prepped and draped in the usual sterile fashion.  A time out was performed to verify the patient's name, date of birth, medical record number, correct site of surgery and correct procedure to be performed. The timeout was also used to confirm the patient received antibiotics and that appropriate instruments, implants and radiographs studies were available in the room.  The leg was elevated and exsanguinated with an Esmarch and the tourniquet was inflated to 265 mmHg.  A midline incision was made over the left knee.. A medial parapatellar arthrotomy was then made and the patella subluxed laterally and the knee was brought into 90 of flexion. Hoffa's fat pad along with the anterior cruciate ligament was resected and the medial joint line was exposed.  Attention was then turned to preparation of the patella. The thickness of the patella was measured with a caliper, the diameter measured with the patella templates.  The patella resection was then made with an oscillating saw using the patella cutting guide.  The 32 mm button fit appropriately.  3 peg holes for the patella component were then drilled.  The extramedullary tibial cutting guide was then placed using the anterior tibial crest and second ray of the foot as a reference.  The tibial cutting guide was adjusted to allow for appropriate posterior slope.  The tibial cutting block was pinned into position. The slotted stylus was used to measure the proximal tibial resection of 9 mm off the high lateral side. Care was taken during the tibial resection to protect the medial and collateral ligaments.  The resected tibial bone was removed.  The distal femur  was resected using the Visionaire cutting guide.  Care was taken to protect the collateral ligaments during distal femoral resection.  The distal femoral  resection was performed with an oscillating saw. The femoral cutting guide was then removed. Extension gap was measured with a 9 mm spacer block and alignment and extension was confirmed using a long alignment rod. The femur was sized to be a 6. Rotation of the referencing guide was checked with the epicondylar axis and Whitesides line. Then the 4-in-1 cutting jig was then applied to the distal femur. A stylus was used to confirm that the anterior femur would not be notched.   Then the anterior, posterior and chamfer femoral cuts were then made with an oscillating saw.  The knee was distracted and all posterior osteophytes were removed.  The flexion gap was then measured with a flexion spacer block and long alignment rod and was found to be symmetric with the extension gap and perpendicular to mechanical axis of the tibia.  The proximal tibia plateau was then sized with trial trays. The best coverage was achieved with a size 4. This tibial tray was then pinned into position. The proximal tibia was then prepared with the keel punch.  After tibial preparation was completed, all trial components were inserted with polyethylene trials. The knee achieved full extension and flexed to 120 degrees. Ligament were stable to varus and valgus at full extension as well as 30, 60 and 90 degrees of flexion.   The trials were then placed. Knee was taken through a full range of motion and deemed to be stable with the trial components. All trial components were then removed.  The joint was copiously irrigated with pulse lavage.  The final total knee arthroplasty components were then cemented into place. The knee was held in extension while cement was allowed to cure.The knee was taken through a range of motion and the patella tracked well and the knee was again irrigated copiously.  The knee capsule was then injected with Exparel.  The medial arthrotomy was closed with #1 Vicryl and #2 Quill. The subcutaneous tissue closed  with  2-0 vicryl, and skin approximated with staples.  A dry sterile and compressive dressing was applied.  A Polar Care was applied to the operative knee.  The patient was awakened and brought to the PACU in stable and satisfactory condition.  All sharp, lap and instrument counts were correct at the conclusion the case. I spoke with the patient's family in the postop consultation room to let them know the case had been performed without complication and the patient was stable in recovery room.   Total tourniquet time was 60 minutes.

## 2017-10-03 NOTE — Transfer of Care (Signed)
Immediate Anesthesia Transfer of Care Note  Patient: [name] [name]  Procedure(s) Performed: TOTAL KNEE ARTHROPLASTY (Right Knee)  Patient Location: PACU  Anesthesia Type:MAC and Spinal  Level of Consciousness: drowsy and patient cooperative  Airway & Oxygen Therapy: Patient Spontanous Breathing and Patient connected to nasal cannula oxygen  Post-op Assessment: Report given to RN and Post -op Vital signs reviewed and stable  Post vital signs: Reviewed and stable  Last Vitals:  Vitals Value Taken Time  BP 110/82 10/03/2017  1:02 PM  Temp    Pulse 62 10/03/2017  1:06 PM  Resp 11 10/03/2017  1:06 PM  SpO2 100 % 10/03/2017  1:06 PM  Vitals shown include unvalidated device data.  Last Pain:  Vitals:   10/03/17 1021  TempSrc:   PainSc: 0-No pain         Complications: No apparent anesthesia complications

## 2017-10-03 NOTE — H&P (Signed)
The patient has been re-examined, and the chart reviewed, and there have been no interval changes to the documented history and physical.  Plan a right total knee replacement today.  Anesthesia is consulted regarding a peripheral nerve block for post-operative pain.  The risks, benefits, and alternatives have been discussed at length, and the patient is willing to proceed.     

## 2017-10-03 NOTE — Anesthesia Preprocedure Evaluation (Addendum)
Anesthesia Evaluation  Patient identified by MRN, date of birth, ID band Patient awake    Reviewed: Allergy & Precautions, NPO status , Patient's Chart, lab work & pertinent test results  Airway Mallampati: II  TM Distance: >3 FB     Dental  (+) Poor Dentition, Chipped   Pulmonary shortness of breath, Current Smoker,    Pulmonary exam normal        Cardiovascular hypertension, Pt. on medications Normal cardiovascular exam     Neuro/Psych PSYCHIATRIC DISORDERS Anxiety Depression CVA    GI/Hepatic negative GI ROS,   Endo/Other  diabetes, Well Controlled, Type 2, Oral Hypoglycemic Agents  Renal/GU negative Renal ROS Bladder dysfunction      Musculoskeletal  (+) Arthritis , Osteoarthritis,    Abdominal Normal abdominal exam  (+)   Peds negative pediatric ROS (+)  Hematology negative hematology ROS (+)   Anesthesia Other Findings   Reproductive/Obstetrics                            Anesthesia Physical Anesthesia Plan  ASA: III  Anesthesia Plan:    Post-op Pain Management:  Regional for Post-op pain   Induction:   PONV Risk Score and Plan:   Airway Management Planned:   Additional Equipment:   Intra-op Plan:   Post-operative Plan:   Informed Consent: I have reviewed the patients History and Physical, chart, labs and discussed the procedure including the risks, benefits and alternatives for the proposed anesthesia with the patient or authorized representative who has indicated his/her understanding and acceptance.   Dental advisory given  Plan Discussed with: CRNA and Surgeon  Anesthesia Plan Comments:        Anesthesia Quick Evaluation

## 2017-10-04 ENCOUNTER — Encounter: Payer: Self-pay | Admitting: Orthopedic Surgery

## 2017-10-04 LAB — BASIC METABOLIC PANEL
Anion gap: 8 (ref 5–15)
BUN: 22 mg/dL — ABNORMAL HIGH (ref 6–20)
CO2: 25 mmol/L (ref 22–32)
Calcium: 8.4 mg/dL — ABNORMAL LOW (ref 8.9–10.3)
Chloride: 106 mmol/L (ref 98–111)
Creatinine, Ser: 0.93 mg/dL (ref 0.44–1.00)
GFR calc Af Amer: >60 mL/min (ref >60)
GFR calc non Af Amer: >60 mL/min (ref >60)
GLUCOSE: 177 mg/dL — AB (ref 70–99)
POTASSIUM: 4 mmol/L (ref 3.5–5.1)
Sodium: 139 mmol/L (ref 135–145)

## 2017-10-04 LAB — GLUCOSE, CAPILLARY
GLUCOSE-CAPILLARY: 169 mg/dL — AB (ref 70–99)
GLUCOSE-CAPILLARY: 258 mg/dL — AB (ref 70–99)
Glucose-Capillary: 133 mg/dL — ABNORMAL HIGH (ref 70–99)
Glucose-Capillary: 228 mg/dL — ABNORMAL HIGH (ref 70–99)

## 2017-10-04 LAB — CBC
HEMATOCRIT: 32.6 % — AB (ref 35.0–47.0)
Hemoglobin: 11.2 g/dL — ABNORMAL LOW (ref 12.0–16.0)
MCH: 32.5 pg (ref 26.0–34.0)
MCHC: 34.5 g/dL (ref 32.0–36.0)
MCV: 94.3 fL (ref 80.0–100.0)
Platelets: 211 10*3/uL (ref 150–440)
RBC: 3.45 MIL/uL — ABNORMAL LOW (ref 3.80–5.20)
RDW: 12.7 % (ref 11.5–14.5)
WBC: 8.9 10*3/uL (ref 3.6–11.0)

## 2017-10-04 NOTE — Care Management Note (Signed)
Case Management Note  Patient Details  Name: [name] [name] MRN: [id number] Date of Birth: May 05, 1965  Subjective/Objective:                  RNCM spoke with patient regarding transition of care. She lives [facility] and currently makes her own decisions.  She states she "walks some but also admits that she uses a wheelchair more".  She has no preference of home health agencies however agrees. She has follow up with PCP on 10/12/17 at 11AM; Follow up with ortho Dr. Odis LusterBowers on 10/16/17 at 220P and OPPT at [facility] on 10/10/17 at 1015AM.  Action/Plan:   [facility]   Expected Discharge Date:  10/05/17               Expected Discharge Plan:     In-House Referral:     Discharge planning Services  CM Consult  Post Acute Care Choice:  Home Health Choice offered to:  Patient  DME Arranged:    DME Agency:     HH Arranged:  PT HH Agency:  [facility]  Status of Service:  In process, will continue to follow  If discussed at Long Length of Stay Meetings, dates discussed:    Additional Comments:  Collie Siadngela Mccall Lomax, RN 10/04/2017, 3:50 PM

## 2017-10-04 NOTE — [place] FL2 (Addendum)
[place] MEDICAID FL2 LEVEL OF CARE SCREENING TOOL     IDENTIFICATION  Patient Name: [name] [name] Birthdate: Dec 19, 1965 Sex: female Admission Date (Current Location): 10/03/2017  [place]  [place] ([id number] [facility]) Facility and Address:  [facility], [address], [place], [place] [address]      Provider Number: [id number]  Attending Physician Name and Address:  Lyndle HerrlichBowers, James R, MD  Relative Name and Phone Number:       Current Level of Care: Hospital [facility] Prior Approval Number:    Date Approved/Denied:   PASRR Number:    Discharge Plan: Domiciliary (Rest home)    Current Diagnoses: Patient Active Problem List   Diagnosis Date Noted  . 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 Anxiety 04/02/2017  . Acute cystitis 04/02/2017  . Urinary incontinence 01/25/2016  . Fecal incontinence 01/25/2016  . Dysthymia 01/25/2016    Orientation RESPIRATION BLADDER Height & Weight     Self, Time, Situation, Place  Normal Incontinent Weight: 187 lb (84.8 kg) Height:  5\' 8"  (172.7 cm)  BEHAVIORAL SYMPTOMS/MOOD NEUROLOGICAL BOWEL NUTRITION STATUS      Continent Diet(Diet: Carb Modified. )  AMBULATORY STATUS COMMUNICATION OF NEEDS Skin   Extensive Assist Verbally Surgical wounds(Incision: Right Knee. )                       Personal Care Assistance Level of Assistance  Bathing, Feeding, Dressing Bathing Assistance: Limited assistance Feeding assistance: Independent Dressing Assistance: Limited assistance     Functional Limitations Info  Sight, Hearing, Speech Sight Info: Adequate Hearing Info: Adequate Speech Info: Adequate    SPECIAL CARE FACTORS FREQUENCY  PT (By licensed PT)   follow up with PCP on 10/12/17 at 11AM; Follow up with ortho Dr. Odis LusterBowers on 10/16/17 at 220P and OPPT at [facility] on 10/10/17 at 1015AM     PT Frequency: (2-3 times per week home  health )              Contractures      Additional Factors Info  Code Status, Allergies Code Status Info: (Full Code. ) Allergies Info: (Latex)          Discharge Medications: Please see discharge summary for a list of discharge medications. Medication List    STOP taking these medications   meloxicam 7.5 MG tablet Commonly known as:  MOBIC     TAKE these medications   albuterol 108 (90 Base) MCG/ACT inhaler Commonly known as:  PROVENTIL HFA;VENTOLIN HFA Inhale 2 puffs into the lungs every 4 (four) hours as needed for wheezing or shortness of breath.   amLODipine 5 MG tablet Commonly known as:  NORVASC Take 5 mg by mouth daily. (0800)   aspirin 81 MG chewable tablet Chew 1 tablet (81 mg total) by mouth 2 (two) times daily.   atorvastatin 40 MG tablet Commonly known as:  LIPITOR Take 40 mg by mouth daily at 8 pm. (2000)   buPROPion 150 MG 24 hr tablet Commonly known as:  WELLBUTRIN XL Take 150 mg by mouth daily. (0800)   cholecalciferol 1000 units tablet Commonly known as:  VITAMIN D Take 1,000 Units by mouth daily. (0800)   docusate sodium 100 MG capsule Commonly known as:  COLACE Take 1 capsule (100 mg total) by mouth 2 (two) times daily.   Fluticasone-Salmeterol 100-50 MCG/DOSE Aepb Commonly known as:  ADVAIR Inhale 1 puff into the lungs  2 (two) times daily.   furosemide 20 MG tablet Commonly known as:  LASIX Take 20 mg by mouth daily. (0800)   HYDROcodone-acetaminophen 5-325 MG tablet Commonly known as:  NORCO/VICODIN Take 1-2 tablets by mouth every 4 (four) hours as needed for moderate pain (pain score 4-6).   lisinopril 20 MG tablet Commonly known as:  PRINIVIL,ZESTRIL Take 20 mg by mouth daily. (0800)   metFORMIN 500 MG 24 hr tablet Commonly known as:  GLUCOPHAGE-XR Take 1,000 mg by mouth 2 (two) times daily. (0800 & 2000)   Relevant Imaging Results: Relevant Lab Results: Additional Information (SSN:  [id number])  [name] [name], Darleen CrockerBailey M, LCSW

## 2017-10-04 NOTE — Progress Notes (Signed)
  Subjective:  Patient reports pain as mild.    Objective:   VITALS:   Vitals:   10/04/17 0831 10/04/17 1213 10/04/17 1612 10/04/17 1917  BP: 115/83 136/88 (!) 162/95 (!) 150/90  Pulse: 91 96 (!) 105 100  Resp:  18 18   Temp:  98.6 F (37 C) 98.3 F (36.8 C)   TempSrc:  Oral Oral   SpO2: 100% 100% 100%   Weight:      Height:        PHYSICAL EXAM:  Sensation intact distally Intact pulses distally Dorsiflexion/Plantar flexion intact Incision: dressing C/D/I Compartment soft  LABS  Results for orders placed or performed during the hospital encounter of 10/03/17 (from the past 24 hour(s))  Basic metabolic panel     Status: Abnormal   Collection Time: 10/04/17  3:10 AM  Result Value Ref Range   Sodium 139 135 - 145 mmol/L   Potassium 4.0 3.5 - 5.1 mmol/L   Chloride 106 98 - 111 mmol/L   CO2 25 22 - 32 mmol/L   Glucose, Bld 177 (H) 70 - 99 mg/dL   BUN 22 (H) 6 - 20 mg/dL   Creatinine, Ser 1.610.93 0.44 - 1.00 mg/dL   Calcium 8.4 (L) 8.9 - 10.3 mg/dL   GFR calc non Af Amer >60 >60 mL/min   GFR calc Af Amer >60 >60 mL/min   Anion gap 8 5 - 15  CBC     Status: Abnormal   Collection Time: 10/04/17  3:10 AM  Result Value Ref Range   WBC 8.9 3.6 - 11.0 K/uL   RBC 3.45 (L) 3.80 - 5.20 MIL/uL   Hemoglobin 11.2 (L) 12.0 - 16.0 g/dL   HCT 09.632.6 (L) 04.535.0 - 40.947.0 %   MCV 94.3 80.0 - 100.0 fL   MCH 32.5 26.0 - 34.0 pg   MCHC 34.5 32.0 - 36.0 g/dL   RDW 81.112.7 91.411.5 - 78.214.5 %   Platelets 211 150 - 440 K/uL  Glucose, capillary     Status: Abnormal   Collection Time: 10/04/17  7:39 AM  Result Value Ref Range   Glucose-Capillary 133 (H) 70 - 99 mg/dL  Glucose, capillary     Status: Abnormal   Collection Time: 10/04/17 11:51 AM  Result Value Ref Range   Glucose-Capillary 169 (H) 70 - 99 mg/dL  Glucose, capillary     Status: Abnormal   Collection Time: 10/04/17  5:12 PM  Result Value Ref Range   Glucose-Capillary 228 (H) 70 - 99 mg/dL  Glucose, capillary     Status: Abnormal   Collection Time: 10/04/17  9:10 PM  Result Value Ref Range   Glucose-Capillary 258 (H) 70 - 99 mg/dL   Comment 1 Notify RN     Dg Knee Right Port  Result Date: 10/03/2017 CLINICAL DATA:  RIGHT knee arthroplasty EXAM: PORTABLE RIGHT KNEE - 1-2 VIEW COMPARISON:  08/04/2015 radiographs FINDINGS: RIGHT total knee arthroplasty changes identified. No acute fracture or dislocation. No complicating features are identified. IMPRESSION: RIGHT total knee arthroplasty without complicating features. Electronically Signed   By: Harmon PierJeffrey  Hu M.D.   On: 10/03/2017 13:41    Assessment/Plan: 1 Day Post-Op   Active Problems:   S/P TKR (total knee replacement) using cement, right   Advance diet Up with therapy D/C IV fluids  Likely transfer to family care home with home health PT tomorrow   Lyndle HerrlichJames R Sherrell Farish , MD 10/04/2017, 11:10 PM

## 2017-10-04 NOTE — Evaluation (Signed)
Physical Therapy Evaluation Patient Details Name: [name] [name] MRN: [id number] DOB: [birth year] Today's Date: 10/04/2017   History of Present Illness  Patient is 52 yo female s/p R TKA 10/04/17, WBAT. PMH of HTN, current smoker, SOB, anxiety, depression, CVA, DM II  Clinical Impression  Patient alert and agreeable to PT, states at rest her R knee pain is 0/10, later in session with mobility 5-6/10. Patient reports that she lives in a group home where she uses a cane and the home assists with cooking and cleaning. States there are 3 STE with b/l rails. Reports she has not family/friends that would be available to assist her at discharge. Nursing reports that patient uses WC at baseline, PT spoke with group home which states patient primarily uses WC in home, needs intermittent assistance with ADLs, and [facility]  Patient demonstrates generalized weakness diffusely, decreased activity tolerance, and endurance. Participated in bed level exercises with AAROM, difficulty with TKE/SLR b/l. Supine to sit with supervision, verbal cues and extended time. Needed elevated surface for sit <> stands and minAx1. Ambulated ~655ft with significantly crouched gait, shuffling step, and minAx1 for AD use. Needed redirection for ambulation and cues for proper techniques for transfers throughout session. Patient also exhibited signs of being emotional, but denies feeling upset. Overall the patient demonstrates changes from PLOF and would benefit from further skilled PT to maximize independence, mobility, safety, and education. Current recommendation in SNF due to mobility status, decreased caregiver support, and inaccessible home environment.    Follow Up Recommendations SNF    Equipment Recommendations  Rolling walker with 5" wheels    Recommendations for Other Services       Precautions / Restrictions Precautions Precautions: Knee Precaution Booklet Issued: Yes (comment) Restrictions Weight  Bearing Restrictions: Yes RLE Weight Bearing: Weight bearing as tolerated      Mobility  Bed Mobility Overal bed mobility: Needs Assistance Bed Mobility: Supine to Sit     Supine to sit: Supervision     General bed mobility comments: verbal cues for technique, extended time to maximize independence  Transfers Overall transfer level: Needs assistance Equipment used: Rolling walker (2 wheeled) Transfers: Sit to/from Stand Sit to Stand: From elevated surface;Min assist         General transfer comment: Patient unable to sit <> stand from lower surface, minAx1 from elevated bed  Ambulation/Gait Ambulation/Gait assistance: Min assist;Min guard Gait Distance (Feet): 5 Feet Assistive device: Rolling walker (2 wheeled)       General Gait Details: Significantly crouched gait, decreased speed, shuffling steps. Verbal cues and physical assistance needed for AD management.   Stairs            Wheelchair Mobility    Modified Rankin (Stroke Patients Only)       Balance Overall balance assessment: Needs assistance Sitting-balance support: Feet supported Sitting balance-Leahy Scale: Fair       Standing balance-Leahy Scale: Poor                               Pertinent Vitals/Pain Pain Assessment: 0-10 Pain Score: 6  Pain Location: R knee Pain Descriptors / Indicators: Aching;Sore Pain Intervention(s): Limited activity within patient's tolerance;Patient requesting pain meds-RN notified;Monitored during session;Ice applied;Repositioned    Home Living Family/patient expects to be discharged to:: Group home                 Additional Comments: PLOF uncertain, PT  spoke with Group home. Staff reports at baseline patient uses WC in home, and intermittently needs assistance with bathing/dressing. Uses cane for MD appts.     Prior Function Level of Independence: Needs assistance   Gait / Transfers Assistance Needed: uses WC and cane per patient,  confirmed.  ADL's / Homemaking Assistance Needed: States group home does the cooking/cleaning, needs assistance with dressing/bathing        Hand Dominance   Dominant Hand: Right    Extremity/Trunk Assessment   Upper Extremity Assessment Upper Extremity Assessment: Generalized weakness;RUE deficits/detail;LUE deficits/detail RUE Deficits / Details: 3+/5 grossly LUE Deficits / Details: 3+/5 grossly    Lower Extremity Assessment Lower Extremity Assessment: Generalized weakness;RLE deficits/detail;LLE deficits/detail RLE Deficits / Details: 2+/5 grossly RLE: Unable to fully assess due to pain LLE Deficits / Details: Difficulty with SLR, TKE, and heel slides on LLE as well.        Communication   Communication: No difficulties  Cognition Arousal/Alertness: Awake/alert Behavior During Therapy: WFL for tasks assessed/performed Overall Cognitive Status: Within Functional Limits for tasks assessed                                 General Comments: Patient exhibited emotional signs/symptoms during session, denied that she was upset.      General Comments      Exercises Total Joint Exercises Ankle Circles/Pumps: AROM;AAROM;Both;20 reps Quad Sets: AROM;Strengthening;Right;20 reps Short Arc Quad: AROM;Strengthening;Right;20 reps Hip ABduction/ADduction: AAROM;Strengthening;15 reps Straight Leg Raises: AAROM;Right;10 reps;AROM;Left   Assessment/Plan    PT Assessment Patient needs continued PT services  PT Problem List Decreased strength;Decreased range of motion;Decreased knowledge of use of DME;Decreased activity tolerance;Decreased safety awareness;Decreased balance;Decreased knowledge of precautions;Decreased mobility;Pain       PT Treatment Interventions DME instruction;Balance training;Gait training;Neuromuscular re-education;Stair training;Functional mobility training;Patient/family education;Therapeutic activities;Therapeutic exercise    PT Goals  (Current goals can be found in the Care Plan section)  Acute Rehab PT Goals Patient Stated Goal: Patient wants her pain to be controlled PT Goal Formulation: With patient Time For Goal Achievement: 10/18/17 Potential to Achieve Goals: Good    Frequency BID   Barriers to discharge Inaccessible home environment;Decreased caregiver support      Co-evaluation               AM-PAC PT "6 Clicks" Daily Activity  Outcome Measure Difficulty turning over in bed (including adjusting bedclothes, sheets and blankets)?: A Little Difficulty moving from lying on back to sitting on the side of the bed? : A Lot Difficulty sitting down on and standing up from a chair with arms (e.g., wheelchair, bedside commode, etc,.)?: Unable Help needed moving to and from a bed to chair (including a wheelchair)?: A Lot Help needed walking in hospital room?: A Lot Help needed climbing 3-5 steps with a railing? : Total 6 Click Score: 11    End of Session Equipment Utilized During Treatment: Gait belt Activity Tolerance: Patient limited by pain;Patient limited by fatigue Patient left: in chair;with chair alarm set;with call bell/phone within reach;with SCD's reapplied(heels elevated, polar care applied) Nurse Communication: Mobility status PT Visit Diagnosis: Unsteadiness on feet (R26.81);Difficulty in walking, not elsewhere classified (R26.2);Other abnormalities of gait and mobility (R26.89);Muscle weakness (generalized) (M62.81);Pain Pain - Right/Left: Right Pain - part of body: Knee    Time: 0822-0856 PT Time Calculation (min) (ACUTE ONLY): 34 min   Charges:   PT Evaluation $PT Eval Low Complexity: 1 Low PT Treatments $  Therapeutic Activity: 8-22 mins      Olga Coasteriana Karson Chicas PT, DPT 9:25 AM,10/04/17 [phone]

## 2017-10-04 NOTE — Progress Notes (Signed)
Pt rested quietly during the night. Tolerated sitting on side of bed. No acute distress noted. Staff will continue to monitor pt.

## 2017-10-04 NOTE — Clinical Social Work Note (Signed)
Clinical Social Work Assessment  Patient Details  Name: JAMESIA LINNEN MRN: [id number] Date of Birth: 1965-04-17  Date of referral:  10/04/17               Reason for consult:  Other (Comment Required)(Patient is from [facility]. )                Permission sought to share information with:  Facility Art therapist granted to share information::  Yes, Verbal Permission Granted  Name::      [place]::     Relationship::     Contact Information:     Housing/Transportation Living arrangements for the past 2 months:  [place] of Information:  Patient, Facility Patient Interpreter Needed:  None Criminal Activity/Legal Involvement Pertinent to Current Situation/Hospitalization:  No - Comment as needed Significant Relationships:  Other(Comment)(Facility ) Lives with:  Facility Resident Do you feel safe going back to the place where you live?  Yes Need for family participation in patient care:  Yes (Comment)  Care giving concerns:  Patient is a resident at [facility] located at [address] [place], [place] [address] (fax: [phone]).    Social Worker assessment / plan:  Holiday representative (CSW) received verbal consult from Dr. Harlow Mares that patient is from a family care home and will likely D/C tomorrow. PT is recommending SNF however patient is at her baseline. Per Lattie Haw staff member at [facility] patient uses a wheel chair and needs assistance with transfers. Per Lattie Haw patient does not walk very much. Per Lattie Haw patient only has 2 steps to get up in the home and the rest is 1 level. Per Lattie Haw patient can return to the family care home. CSW also contacted family care home owner [name]. Per [name] patient does not have a guardian however [place] is pursuing guardianship. Per [name] patient is wheel chair bound and can return to the family care home with home health. Per [name]  patient can use EMS for transport home. CSW met with patient and made her aware of above. Patient is in agreement with going back to family care home. RN case manager aware of above. FL2 complete. CSW will continue to follow and assist as needed.   Employment status:  Disabled (Comment on whether or not currently receiving Disability), Retired Forensic scientist:  Medicaid [place] PT Recommendations:  [place] / Referral to community resources:  Other (Comment Required)(Patient will return to group home. )  Patient/Family's Response to care:  Patient is agreeable to D/C back to her family care home.   Patient/Family's Understanding of and Emotional Response to Diagnosis, Current Treatment, and Prognosis:  Patient was very pleasant and thanked CSW for assistance.   Emotional Assessment Appearance:  Appears stated age Attitude/Demeanor/Rapport:    Affect (typically observed):  Accepting, Adaptable, Pleasant Orientation:  Oriented to Self, Oriented to Place, Oriented to  Time, Oriented to Situation Alcohol / Substance use:  Not Applicable Psych involvement (Current and /or in the community):  No (Comment)  Discharge Needs  Concerns to be addressed:  Discharge Planning Concerns Readmission within the last 30 days:  No Current discharge risk:  Dependent with Mobility Barriers to Discharge:  Continued Medical Work up   [employer], Veronia Beets, LCSW 10/04/2017, 2:21 PM

## 2017-10-04 NOTE — Progress Notes (Signed)
Pts BP elevated. Prime doc Imogene Burnhen notified. Per MD ok to give morning BP meds.  Meds were held this morning because pts BP was low. See Mar.

## 2017-10-04 NOTE — Progress Notes (Signed)
Physical Therapy Treatment Patient Details Name: [name] [name] MRN: [id number] DOB: [birth year] Today's Date: 10/04/2017    History of Present Illness Patient is 52 yo female s/p R TKA 10/04/17, WBAT. PMH of HTN, current smoker, SOB, anxiety, depression, CVA, DM II    PT Comments    Patient alert and agreeable to PT at start of session, up in chair. No pain at rest. The patient neede assistance for all exercises this PM except ankle pumps, reported pain with b/l SLR. Sit<> stand transfer x2 this session with minAx1 and RW. Ambulated ~782ft with significantly crouched gait, increased knee flexion, instability, and complaints of fatigue, minAx1 to assist to EOB safely. Sit to supine with minAx1 for LE management. Patient in bed with all needs in reach at end of session.    Follow Up Recommendations  SNF     Equipment Recommendations  Rolling walker with 5" wheels    Recommendations for Other Services       Precautions / Restrictions Precautions Precautions: Fall Restrictions Weight Bearing Restrictions: Yes RLE Weight Bearing: Weight bearing as tolerated    Mobility  Bed Mobility Overal bed mobility: Needs Assistance Bed Mobility: Sit to Supine     Supine to sit: Min assist     General bed mobility comments: minAx1 for LE management  Transfers Overall transfer level: Needs assistance Equipment used: Rolling walker (2 wheeled) Transfers: Sit to/from Stand Sit to Stand: From elevated surface;Min assist            Ambulation/Gait Ambulation/Gait assistance: Min assist;Min guard Gait Distance (Feet): 2 Feet Assistive device: Rolling walker (2 wheeled)       General Gait Details: fatigued quickly, crouched gait, increased knee flexion b/l   Stairs             Wheelchair Mobility    Modified Rankin (Stroke Patients Only)       Balance Overall balance assessment: Needs assistance Sitting-balance support: Feet supported Sitting balance-Leahy  Scale: Fair       Standing balance-Leahy Scale: Poor                              Cognition Arousal/Alertness: Awake/alert Behavior During Therapy: WFL for tasks assessed/performed Overall Cognitive Status: Within Functional Limits for tasks assessed                                 General Comments: Patient exhibited emotional signs/symptoms during session, denied that she was upset.      Exercises Total Joint Exercises Ankle Circles/Pumps: AROM;Both;20 reps Quad Sets: AROM;Both;20 reps Straight Leg Raises: AAROM;Right;10 reps;AROM;Left Long Arc Quad: AAROM;Right;15 reps;AROM;Left Knee Flexion: AAROM;Right;15 reps    General Comments        Pertinent Vitals/Pain Pain Assessment: Faces Faces Pain Scale: Hurts little more Pain Location: with standing, R knee pain Pain Intervention(s): Limited activity within patient's tolerance;Monitored during session;Repositioned;Ice applied    Home Living                      Prior Function            PT Goals (current goals can now be found in the care plan section) Progress towards PT goals: Progressing toward goals    Frequency    BID      PT Plan Current plan remains appropriate    Co-evaluation  AM-PAC PT "6 Clicks" Daily Activity  Outcome Measure    Difficulty moving from lying on back to sitting on the side of the bed? : A Little Difficulty sitting down on and standing up from a chair with arms (e.g., wheelchair, bedside commode, etc,.)?: Unable Help needed moving to and from a bed to chair (including a wheelchair)?: A Lot Help needed walking in hospital room?: A Lot Help needed climbing 3-5 steps with a railing? : Total 6 Click Score: 9    End of Session Equipment Utilized During Treatment: Gait belt Activity Tolerance: Patient limited by pain;Patient limited by fatigue Patient left: in bed;with bed alarm set;with call bell/phone within reach;with SCD's  reapplied(heels elevated, polar care applied) Nurse Communication: Mobility status PT Visit Diagnosis: Unsteadiness on feet (R26.81);Difficulty in walking, not elsewhere classified (R26.2);Other abnormalities of gait and mobility (R26.89);Muscle weakness (generalized) (M62.81);Pain Pain - Right/Left: Right Pain - part of body: Knee     Time: 1300-1325 PT Time Calculation (min) (ACUTE ONLY): 25 min  Charges:  $Therapeutic Exercise: 8-22 mins $Therapeutic Activity: 8-22 mins                     Olga Coasteriana Jaquia Benedicto PT, DPT 1:35 PM,10/04/17 [phone]

## 2017-10-05 LAB — GLUCOSE, CAPILLARY
GLUCOSE-CAPILLARY: 241 mg/dL — AB (ref 70–99)
Glucose-Capillary: 183 mg/dL — ABNORMAL HIGH (ref 70–99)
Glucose-Capillary: 227 mg/dL — ABNORMAL HIGH (ref 70–99)

## 2017-10-05 LAB — CBC
HEMATOCRIT: 29.4 % — AB (ref 35.0–47.0)
Hemoglobin: 10.2 g/dL — ABNORMAL LOW (ref 12.0–16.0)
MCH: 32 pg (ref 26.0–34.0)
MCHC: 34.6 g/dL (ref 32.0–36.0)
MCV: 92.5 fL (ref 80.0–100.0)
Platelets: 202 10*3/uL (ref 150–440)
RBC: 3.18 MIL/uL — ABNORMAL LOW (ref 3.80–5.20)
RDW: 12.8 % (ref 11.5–14.5)
WBC: 11.6 10*3/uL — ABNORMAL HIGH (ref 3.6–11.0)

## 2017-10-05 MED ORDER — HYDROCODONE-ACETAMINOPHEN 5-325 MG PO TABS: 1.0000 | ORAL_TABLET | ORAL | 0 refills | Status: DC | PRN

## 2017-10-05 MED ORDER — DOCUSATE SODIUM 100 MG PO CAPS: 100.0000 mg | ORAL_CAPSULE | Freq: Two times a day (BID) | ORAL | 0 refills | Status: DC

## 2017-10-05 MED ORDER — ASPIRIN 81 MG PO CHEW: 81.0000 mg | CHEWABLE_TABLET | Freq: Two times a day (BID) | ORAL | 0 refills | Status: DC

## 2017-10-05 NOTE — Progress Notes (Signed)
Physical Therapy Treatment Patient Details Name: Jerilee HohJennifer R Basil MRN: [id number] DOB: 08-15-1965 Today's Date: 10/05/2017    History of Present Illness Patient is 52 yo female s/p R TKA 10/04/17, WBAT. PMH of HTN, current smoker, SOB, anxiety, depression, CVA, DM II    PT Comments    Patient alert and agreeable to PT at start of session with no complaints of R knee pain at rest. With mobility R knee pain increased to 8-9/10, nursing notified at end of session that patient wasa requesting pain medication. Therapeutic exercises performed today with assistance needed for RLE. Bed mobility minAx1 and increased time to maximize independence, sit <> stand with minAx1 and RW, cues for hand placement and posture. Patient ambulated ~682ft to chair with significant pain, crouched gait, increased knee flexion and shuffling step. Patient left up in chair at end of session with all needs in reach. The patient would benefit from further skilled PT to continue to address limitations, pain, and education about mobility/precautions.     Follow Up Recommendations  SNF     Equipment Recommendations  Rolling walker with 5" wheels    Recommendations for Other Services       Precautions / Restrictions Precautions Precautions: Fall Restrictions Weight Bearing Restrictions: Yes RLE Weight Bearing: Weight bearing as tolerated    Mobility  Bed Mobility Overal bed mobility: Needs Assistance Bed Mobility: Supine to Sit     Supine to sit: Min assist     General bed mobility comments: minAx1 for LE management  Transfers Overall transfer level: Needs assistance Equipment used: Rolling walker (2 wheeled) Transfers: Sit to/from Stand Sit to Stand: From elevated surface;Min assist            Ambulation/Gait Ambulation/Gait assistance: Min assist;Min guard Gait Distance (Feet): 2 Feet Assistive device: Rolling walker (2 wheeled)       General Gait Details: fatigued quickly, crouched gait,  assistance needed for AD management this morning   Stairs             Wheelchair Mobility    Modified Rankin (Stroke Patients Only)       Balance Overall balance assessment: Needs assistance Sitting-balance support: Feet supported Sitting balance-Leahy Scale: Fair       Standing balance-Leahy Scale: Poor                              Cognition Arousal/Alertness: Awake/alert Behavior During Therapy: WFL for tasks assessed/performed Overall Cognitive Status: Within Functional Limits for tasks assessed                                        Exercises Total Joint Exercises Ankle Circles/Pumps: AROM;Both;20 reps Quad Sets: AROM;Both;20 reps Short Arc QuadBarbaraann Boys: AAROM;Right;15 reps Heel Slides: AAROM;Right;15 reps;AROM;Left;10 reps Straight Leg Raises: AAROM;Right;15 reps;AROM;Left Long Arc Quad: AAROM;Right;20 reps;AROM;Left;10 reps Knee Flexion: AAROM;Right;15 reps Goniometric ROM: R knee extension: 12deg, R knee flexion: 63 deg    General Comments        Pertinent Vitals/Pain Pain Score: 8  Pain Location: R knee Pain Descriptors / Indicators: Crying;Grimacing Pain Intervention(s): Limited activity within patient's tolerance;Monitored during session;Patient requesting pain meds-RN notified;Ice applied    Home Living                      Prior Function  PT Goals (current goals can now be found in the care plan section) Progress towards PT goals: Progressing toward goals    Frequency    BID      PT Plan Current plan remains appropriate    Co-evaluation              AM-PAC PT "6 Clicks" Daily Activity  Outcome Measure  Difficulty turning over in bed (including adjusting bedclothes, sheets and blankets)?: A Little Difficulty moving from lying on back to sitting on the side of the bed? : A Little Difficulty sitting down on and standing up from a chair with arms (e.g., wheelchair, bedside commode,  etc,.)?: Unable Help needed moving to and from a bed to chair (including a wheelchair)?: A Lot Help needed walking in hospital room?: A Lot Help needed climbing 3-5 steps with a railing? : Total 6 Click Score: 12    End of Session Equipment Utilized During Treatment: Gait belt Activity Tolerance: Patient limited by pain;Patient limited by fatigue Patient left: with chair alarm set;in chair;with call bell/phone within reach;with SCD's reapplied;Other (comment)(polar care applied, heels elevated) Nurse Communication: Mobility status PT Visit Diagnosis: Unsteadiness on feet (R26.81);Difficulty in walking, not elsewhere classified (R26.2);Other abnormalities of gait and mobility (R26.89);Muscle weakness (generalized) (M62.81);Pain Pain - Right/Left: Right Pain - part of body: Knee     Time: [id number] PT Time Calculation (min) (ACUTE ONLY): 28 min  Charges:  $Therapeutic Exercise: 8-22 mins $Therapeutic Activity: 8-22 mins                     Olga Coasteriana Dlynn Ranes PT, DPT 9:50 AM,10/05/17 [phone]

## 2017-10-05 NOTE — Discharge Instructions (Signed)
Continue weight bear as tolerated on the right lower extremity.   ° °Elevate the right lower extremity whenever possible and continue the polar care while elevating the extremity. Patient may shower. No bath or submerging the wound.   ° °Take ECASA as directed for blood clot prevention. ° °Continue to work on knee range of motion exercises at home as instructed by physical therapy. Continue to use a walker for assistance with ambulation until cleared by physical therapy. ° °Call [phone] with any questions, such as fever > 101.5 degrees, drainage from the wound or shortness of breath. ° ° °

## 2017-10-05 NOTE — Progress Notes (Signed)
Report called to [name] [name] at [facility]. Patient being discharged this afternoon via EMS. DC instructions given and patient acknowledged understanding. IV removed. Patient will have HH/PT.

## 2017-10-05 NOTE — Anesthesia Postprocedure Evaluation (Signed)
Anesthesia Post Note  Patient: [name] [name]  Procedure(s) Performed: TOTAL KNEE ARTHROPLASTY (Right Knee)  Patient location during evaluation: PACU Anesthesia Type: Spinal Level of consciousness: awake and alert and oriented Pain management: pain level controlled Vital Signs Assessment: post-procedure vital signs reviewed and stable Respiratory status: spontaneous breathing Cardiovascular status: blood pressure returned to baseline Anesthetic complications: no     Last Vitals:  Vitals:   10/04/17 2332 10/05/17 0736  BP: 121/80 130/88  Pulse: 96 92  Resp: 19   Temp: 37.2 C 36.7 C  SpO2: 100% 100%    Last Pain:  Vitals:   10/05/17 1023  TempSrc:   PainSc: 5                  Jackolyn Geron

## 2017-10-05 NOTE — Care Management (Signed)
Notified [facility] of anticipated discharge today. 

## 2017-10-05 NOTE — Plan of Care (Signed)
  Problem: Health Behavior/Discharge Planning: Goal: Ability to manage health-related needs will improve Outcome: Progressing   Problem: Activity: Goal: Risk for activity intolerance will decrease Outcome: Progressing   Problem: Nutrition: Goal: Adequate nutrition will be maintained Outcome: Progressing   Problem: Coping: Goal: Level of anxiety will decrease Outcome: Progressing   Problem: Pain Managment: Goal: General experience of comfort will improve Outcome: Progressing   Problem: Skin Integrity: Goal: Risk for impaired skin integrity will decrease Outcome: Progressing   Problem: Education: Goal: Knowledge of the prescribed therapeutic regimen will improve Outcome: Progressing   Problem: Activity: Goal: Ability to avoid complications of mobility impairment will improve Outcome: Progressing Goal: Range of joint motion will improve Outcome: Progressing   Problem: Pain Management: Goal: Pain level will decrease with appropriate interventions Outcome: Progressing   Problem: Skin Integrity: Goal: Will show signs of wound healing Outcome: Progressing

## 2017-10-05 NOTE — Progress Notes (Signed)
Patient is medically stable for D/C back to [facility] in [place] today. Per Jimmye NormanLawanda Ray family care home owner patient can return today via EMS. Per Rowan BlaseLawanda patient's pharmacy [facility] in [place] and she is aware Dr. Odis LusterBowers sent prescriptions electronically. RN will call report and arrange EMS for transport. Clinical Child psychotherapistocial Worker (CSW) sent D/C orders to [facility]. Patient is aware of above. Deborah staff member at family care home is aware of above. Please reconsult if future social work needs arise. CSW signing off.   Baker Hughes IncorporatedBailey Edyn Qazi, LCSW [phone]

## 2017-10-05 NOTE — Discharge Summary (Signed)
Physician Discharge Summary  Patient ID: [name] [name] MRN: [id number] DOB/AGE: [birth year] 52 y.o.  Admit date: 10/03/2017 Discharge date: 10/05/2017  Admission Diagnoses:  OSTEOARTHRITIS OF RIGHT KNEE <principal problem not specified>  Discharge Diagnoses:  OSTEOARTHRITIS OF RIGHT KNEE Active Problems:   S/P TKR (total knee replacement) using cement, right   Past Medical History:  Diagnosis Date  . Anxiety   . Arthritis   . Depression   . Diabetes mellitus without complication (HCC)   . Dyspnea   . Hypertension   . Incontinence of urine   . Stroke [facility](HCC) 2004   x 2    Surgeries: Procedure(s): TOTAL KNEE ARTHROPLASTY on 10/03/2017   Consultants (if any):   Discharged Condition: Improved  Hospital Course: [name] HohJennifer R Trias is an 52 y.o. female who was admitted 10/03/2017 with a diagnosis of  OSTEOARTHRITIS OF RIGHT KNEE <principal problem not specified> and went to the operating room on 10/03/2017 and underwent the above named procedures.    She was given perioperative antibiotics:  Anti-infectives (From admission, onward)   Start     Dose/Rate Route Frequency Ordered Stop   10/03/17 1700  ceFAZolin (ANCEF) IVPB 2g/100 mL premix     2 g 200 mL/hr over 30 Minutes Intravenous Every 6 hours 10/03/17 1636 10/04/17 0034   10/03/17 0827  ceFAZolin (ANCEF) 2-4 GM/100ML-% IVPB    Note to Pharmacy:  Rayann HemanKizziah, Kaitlin   : cabinet override      10/03/17 0827 10/03/17 1049   10/03/17 0600  ceFAZolin (ANCEF) IVPB 2g/100 mL premix     2 g 200 mL/hr over 30 Minutes Intravenous On call to O.R. 10/02/17 2127 10/03/17 1049    .  She was given sequential compression devices, early ambulation, and ECASA for DVT prophylaxis.  She benefited maximally from the hospital stay and there were no complications.    Recent vital signs:  Vitals:   10/04/17 2332 10/05/17 0736  BP: 121/80 130/88  Pulse: 96 92  Resp: 19   Temp: 98.9 F (37.2 C) 98.1 F (36.7 C)  SpO2: 100% 100%     Recent laboratory studies:  Lab Results  Component Value Date   HGB 10.2 (L) 10/05/2017   HGB 11.2 (L) 10/04/2017   HGB 12.9 10/03/2017   Lab Results  Component Value Date   WBC 11.6 (H) 10/05/2017   PLT 202 10/05/2017   Lab Results  Component Value Date   INR 1.05 09/19/2017   Lab Results  Component Value Date   NA 139 10/04/2017   K 4.0 10/04/2017   CL 106 10/04/2017   CO2 25 10/04/2017   BUN 22 (H) 10/04/2017   CREATININE 0.93 10/04/2017   GLUCOSE 177 (H) 10/04/2017    Discharge Medications:   Allergies as of 10/05/2017      Reactions   Latex Rash   Severe itching.      Medication List    STOP taking these medications   meloxicam 7.5 MG tablet Commonly known as:  MOBIC     TAKE these medications   albuterol 108 (90 Base) MCG/ACT inhaler Commonly known as:  PROVENTIL HFA;VENTOLIN HFA Inhale 2 puffs into the lungs every 4 (four) hours as needed for wheezing or shortness of breath.   amLODipine 5 MG tablet Commonly known as:  NORVASC Take 5 mg by mouth daily. (0800)   aspirin 81 MG chewable tablet Chew 1 tablet (81 mg total) by mouth 2 (two) times daily.   atorvastatin 40 MG tablet Commonly known  as:  LIPITOR Take 40 mg by mouth daily at 8 pm. (2000)   buPROPion 150 MG 24 hr tablet Commonly known as:  WELLBUTRIN XL Take 150 mg by mouth daily. (0800)   cholecalciferol 1000 units tablet Commonly known as:  VITAMIN D Take 1,000 Units by mouth daily. (0800)   docusate sodium 100 MG capsule Commonly known as:  COLACE Take 1 capsule (100 mg total) by mouth 2 (two) times daily.   Fluticasone-Salmeterol 100-50 MCG/DOSE Aepb Commonly known as:  ADVAIR Inhale 1 puff into the lungs 2 (two) times daily.   furosemide 20 MG tablet Commonly known as:  LASIX Take 20 mg by mouth daily. (0800)   HYDROcodone-acetaminophen 5-325 MG tablet Commonly known as:  NORCO/VICODIN Take 1-2 tablets by mouth every 4 (four) hours as needed for moderate pain (pain  score 4-6).   lisinopril 20 MG tablet Commonly known as:  PRINIVIL,ZESTRIL Take 20 mg by mouth daily. (0800)   metFORMIN 500 MG 24 hr tablet Commonly known as:  GLUCOPHAGE-XR Take 1,000 mg by mouth 2 (two) times daily. (0800 & 2000)       Diagnostic Studies: Dg Knee Right Port  Result Date: 10/03/2017 CLINICAL DATA:  RIGHT knee arthroplasty EXAM: PORTABLE RIGHT KNEE - 1-2 VIEW COMPARISON:  08/04/2015 radiographs FINDINGS: RIGHT total knee arthroplasty changes identified. No acute fracture or dislocation. No complicating features are identified. IMPRESSION: RIGHT total knee arthroplasty without complicating features. Electronically Signed   By: Harmon PierJeffrey  Hu M.D.   On: 10/03/2017 13:41    Disposition: Discharge disposition: 01-Home or Self Care         Follow-up Information    Armando GangLindley, Cheryl P, FNP. Go on 10/12/2017.   Specialty:  Family Medicine Why:  at [employer] Contact information: [address] [place] [address] [phone]        Lyndle HerrlichBowers, Ciana Simmon R, MD. Go on 10/16/2017.   Specialty:  Orthopedic Surgery Why:  at 2:20PM with Dr. Odis LusterBowers Aug 21 at 10:15AM outpatient PT follow up.  Contact information: [address] [place] [address] [phone]            Signed: Lyndle HerrlichJames R Tomeka Kantner ,MD 10/05/2017, 9:37 AM

## 2017-10-08 NOTE — Care Management (Signed)
CSW received call from [facility] requesting a rolling walker. Spoke with Dr. Odis LusterBowers. He states he will take care of it and have his staff get the patient the walker. RNCM thanked him for his assistance.

## 2018-09-07 ENCOUNTER — Emergency Department (HOSPITAL_COMMUNITY): Payer: Medicaid Other

## 2018-09-07 ENCOUNTER — Inpatient Hospital Stay (HOSPITAL_COMMUNITY)
Admission: EM | Admit: 2018-09-07 | Discharge: 2018-09-13 | DRG: 072 | Disposition: A | Discharge status: Nursing Home (Includes ICF) | Payer: Medicaid Other | Attending: Internal Medicine | Admitting: Internal Medicine

## 2018-09-07 ENCOUNTER — Encounter (HOSPITAL_COMMUNITY): Payer: Self-pay | Admitting: Emergency Medicine

## 2018-09-07 ENCOUNTER — Other Ambulatory Visit: Payer: Self-pay

## 2018-09-07 DIAGNOSIS — R4182 Altered mental status, unspecified: Secondary | ICD-10-CM

## 2018-09-07 DIAGNOSIS — Z1159 Encounter for screening for other viral diseases: Secondary | ICD-10-CM

## 2018-09-07 DIAGNOSIS — Z7951 Long term (current) use of inhaled steroids: Secondary | ICD-10-CM

## 2018-09-07 DIAGNOSIS — R7303 Prediabetes: Secondary | ICD-10-CM | POA: Diagnosis present

## 2018-09-07 DIAGNOSIS — D72829 Elevated white blood cell count, unspecified: Secondary | ICD-10-CM | POA: Diagnosis not present

## 2018-09-07 DIAGNOSIS — D649 Anemia, unspecified: Secondary | ICD-10-CM | POA: Diagnosis present

## 2018-09-07 DIAGNOSIS — G9341 Metabolic encephalopathy: Principal | ICD-10-CM | POA: Diagnosis present

## 2018-09-07 DIAGNOSIS — G9340 Encephalopathy, unspecified: Secondary | ICD-10-CM | POA: Diagnosis present

## 2018-09-07 DIAGNOSIS — M25462 Effusion, left knee: Secondary | ICD-10-CM | POA: Diagnosis present

## 2018-09-07 DIAGNOSIS — M25569 Pain in unspecified knee: Secondary | ICD-10-CM

## 2018-09-07 DIAGNOSIS — F329 Major depressive disorder, single episode, unspecified: Secondary | ICD-10-CM | POA: Diagnosis present

## 2018-09-07 DIAGNOSIS — Z7984 Long term (current) use of oral hypoglycemic drugs: Secondary | ICD-10-CM

## 2018-09-07 DIAGNOSIS — Z79899 Other long term (current) drug therapy: Secondary | ICD-10-CM

## 2018-09-07 DIAGNOSIS — R32 Unspecified urinary incontinence: Secondary | ICD-10-CM | POA: Diagnosis present

## 2018-09-07 DIAGNOSIS — W1789XA Other fall from one level to another, initial encounter: Secondary | ICD-10-CM | POA: Diagnosis present

## 2018-09-07 DIAGNOSIS — R29719 NIHSS score 19: Secondary | ICD-10-CM | POA: Diagnosis present

## 2018-09-07 DIAGNOSIS — Z7982 Long term (current) use of aspirin: Secondary | ICD-10-CM

## 2018-09-07 DIAGNOSIS — M1712 Unilateral primary osteoarthritis, left knee: Secondary | ICD-10-CM | POA: Diagnosis present

## 2018-09-07 DIAGNOSIS — Z8673 Personal history of transient ischemic attack (TIA), and cerebral infarction without residual deficits: Secondary | ICD-10-CM

## 2018-09-07 DIAGNOSIS — K922 Gastrointestinal hemorrhage, unspecified: Secondary | ICD-10-CM

## 2018-09-07 DIAGNOSIS — I10 Essential (primary) hypertension: Secondary | ICD-10-CM | POA: Diagnosis present

## 2018-09-07 DIAGNOSIS — R4020 Unspecified coma: Secondary | ICD-10-CM | POA: Diagnosis present

## 2018-09-07 DIAGNOSIS — F17210 Nicotine dependence, cigarettes, uncomplicated: Secondary | ICD-10-CM | POA: Diagnosis present

## 2018-09-07 HISTORY — DX: Unspecified urinary incontinence: R32

## 2018-09-07 HISTORY — DX: Cerebral infarction, unspecified: I63.9

## 2018-09-07 LAB — I-STAT CHEM 8, ED
BUN: 27 mg/dL — ABNORMAL HIGH (ref 6–20)
Calcium, Ion: 1.16 mmol/L (ref 1.15–1.40)
Chloride: 105 mmol/L (ref 98–111)
Creatinine, Ser: 1 mg/dL (ref 0.44–1.00)
Glucose, Bld: 214 mg/dL — ABNORMAL HIGH (ref 70–99)
HCT: 25 % — ABNORMAL LOW (ref 36.0–46.0)
Hemoglobin: 8.5 g/dL — ABNORMAL LOW (ref 12.0–15.0)
Potassium: 3.8 mmol/L (ref 3.5–5.1)
Sodium: 140 mmol/L (ref 135–145)
TCO2: 23 mmol/L (ref 22–32)

## 2018-09-07 LAB — COMPREHENSIVE METABOLIC PANEL
ALT: 18 U/L (ref 0–44)
AST: 23 U/L (ref 15–41)
Albumin: 3.9 g/dL (ref 3.5–5.0)
Alkaline Phosphatase: 57 U/L (ref 38–126)
Anion gap: 12 (ref 5–15)
BUN: 30 mg/dL — ABNORMAL HIGH (ref 6–20)
CO2: 23 mmol/L (ref 22–32)
Calcium: 9.3 mg/dL (ref 8.9–10.3)
Chloride: 105 mmol/L (ref 98–111)
Creatinine, Ser: 0.94 mg/dL (ref 0.44–1.00)
GFR calc Af Amer: >60 mL/min (ref >60)
GFR calc non Af Amer: >60 mL/min (ref >60)
Glucose, Bld: 217 mg/dL — ABNORMAL HIGH (ref 70–99)
Potassium: 3.8 mmol/L (ref 3.5–5.1)
Sodium: 140 mmol/L (ref 135–145)
Total Bilirubin: 0.4 mg/dL (ref 0.3–1.2)
Total Protein: 7.8 g/dL (ref 6.5–8.1)

## 2018-09-07 LAB — BLOOD GAS, ARTERIAL
Acid-Base Excess: 1.4 mmol/L (ref 0.0–2.0)
Bicarbonate: 25.7 mmol/L (ref 20.0–28.0)
FIO2: 21
O2 Saturation: 93 %
Patient temperature: 36.7
pCO2 arterial: 38.5 mmHg (ref 32.0–48.0)
pH, Arterial: 7.433 (ref 7.350–7.450)
pO2, Arterial: 70.1 mmHg — ABNORMAL LOW (ref 83.0–108.0)

## 2018-09-07 LAB — RAPID URINE DRUG SCREEN, HOSP PERFORMED
Amphetamines: NOT DETECTED
Barbiturates: NOT DETECTED
Benzodiazepines: NOT DETECTED
Cocaine: NOT DETECTED
Opiates: NOT DETECTED
Tetrahydrocannabinol: NOT DETECTED

## 2018-09-07 LAB — CBC WITH DIFFERENTIAL/PLATELET
Abs Immature Granulocytes: 0.02 10*3/uL (ref 0.00–0.07)
Basophils Absolute: 0 10*3/uL (ref 0.0–0.1)
Basophils Relative: 0 %
Eosinophils Absolute: 0.3 10*3/uL (ref 0.0–0.5)
Eosinophils Relative: 4 %
HCT: 24.7 % — ABNORMAL LOW (ref 36.0–46.0)
Hemoglobin: 6.9 g/dL — CL (ref 12.0–15.0)
Immature Granulocytes: 0 %
Lymphocytes Relative: 34 %
Lymphs Abs: 2.6 10*3/uL (ref 0.7–4.0)
MCH: 19.4 pg — ABNORMAL LOW (ref 26.0–34.0)
MCHC: 27.9 g/dL — ABNORMAL LOW (ref 30.0–36.0)
MCV: 69.4 fL — ABNORMAL LOW (ref 80.0–100.0)
Monocytes Absolute: 0.6 10*3/uL (ref 0.1–1.0)
Monocytes Relative: 8 %
Neutro Abs: 4.1 10*3/uL (ref 1.7–7.7)
Neutrophils Relative %: 54 %
Platelets: 233 10*3/uL (ref 150–400)
RBC: 3.56 MIL/uL — ABNORMAL LOW (ref 3.87–5.11)
RDW: 19.3 % — ABNORMAL HIGH (ref 11.5–15.5)
WBC: 7.7 10*3/uL (ref 4.0–10.5)
nRBC: 0 % (ref 0.0–0.2)

## 2018-09-07 LAB — URINALYSIS, ROUTINE W REFLEX MICROSCOPIC
Bilirubin Urine: NEGATIVE
Glucose, UA: NEGATIVE mg/dL
Hgb urine dipstick: NEGATIVE
Ketones, ur: NEGATIVE mg/dL
Leukocytes,Ua: NEGATIVE
Nitrite: NEGATIVE
Protein, ur: NEGATIVE mg/dL
Specific Gravity, Urine: 1.015 (ref 1.005–1.030)
pH: 5 (ref 5.0–8.0)

## 2018-09-07 LAB — TROPONIN I (HIGH SENSITIVITY): Troponin I (High Sensitivity): <2 ng/L (ref <18)

## 2018-09-07 LAB — ACETAMINOPHEN LEVEL: Acetaminophen (Tylenol), Serum: <10 ug/mL — ABNORMAL LOW (ref 10–30)

## 2018-09-07 LAB — SARS CORONAVIRUS 2 BY RT PCR (HOSPITAL ORDER, PERFORMED IN ~~LOC~~ HOSPITAL LAB): SARS Coronavirus 2: NEGATIVE

## 2018-09-07 LAB — LACTIC ACID, PLASMA: Lactic Acid, Venous: 2.8 mmol/L (ref 0.5–1.9)

## 2018-09-07 LAB — POC OCCULT BLOOD, ED: Fecal Occult Bld: POSITIVE — AB

## 2018-09-07 LAB — PROTIME-INR
INR: 1 (ref 0.8–1.2)
Prothrombin Time: 13.3 seconds (ref 11.4–15.2)

## 2018-09-07 LAB — ABO/RH: ABO/RH(D): A POS

## 2018-09-07 LAB — PREPARE RBC (CROSSMATCH)

## 2018-09-07 LAB — SALICYLATE LEVEL: Salicylate Lvl: <7 mg/dL (ref 2.8–30.0)

## 2018-09-07 LAB — ETHANOL: Alcohol, Ethyl (B): <10 mg/dL (ref <10)

## 2018-09-07 MED ORDER — IOHEXOL 350 MG/ML SOLN
150.0000 mL | Freq: Once | INTRAVENOUS | Status: AC | PRN
  Administered 2018-09-07: 150 mL via INTRAVENOUS

## 2018-09-07 MED ORDER — SODIUM CHLORIDE 0.9 % IV SOLN
10.0000 mL/h | Freq: Once | INTRAVENOUS | Status: AC
  Administered 2018-09-08: 10 mL/h via INTRAVENOUS

## 2018-09-07 MED ORDER — SODIUM CHLORIDE 0.9 % IV BOLUS
1000.0000 mL | Freq: Once | INTRAVENOUS | Status: AC
  Administered 2018-09-07: 1000 mL via INTRAVENOUS

## 2018-09-07 MED ORDER — SODIUM CHLORIDE 0.9 % IV SOLN: INTRAVENOUS | Status: DC

## 2018-09-07 MED ORDER — NALOXONE HCL 2 MG/2ML IJ SOSY
2.0000 mg | PREFILLED_SYRINGE | Freq: Once | INTRAMUSCULAR | Status: AC
  Administered 2018-09-07: 2 mg via INTRAVENOUS
  Filled 2018-09-07: qty 2

## 2018-09-07 NOTE — ED Provider Notes (Signed)
[facility] EMERGENCY DEPARTMENT Provider Note   CSN: [id number] Arrival date & time: 09/07/18  2051     History   Chief Complaint Chief Complaint  Patient presents with   Unresponsive    HPI [name] [name] is a 53 y.o. female.     The history is provided by the EMS personnel and the nursing home. The history is limited by the condition of the patient (AMS).  Pt was seen at 2050. Per EMS and group home report: Pt was given her trazodone, went outside to smoke a cigarette and then "went unresponsive." Group home started CPR. Unclear if pt was ever pulseless or apneic. EMS arrived and pt had peripheral pulses and was breathing slowly/spontaneously. EMS noted "pinpoint pupils," gave IV narcan 2mg  with increased respiratory rate, they placed nasal trumpet, and obtained CBG "161." EMS noted pt to withdraw from painful stimuli only while en route.   Past Medical History:  Diagnosis Date   CVA (cerebral vascular accident) ([place])    Depression    Hypertension    Incontinence     There are no active problems to display for this patient.   History reviewed. No pertinent surgical history.   OB History   No obstetric history on file.      Home Medications    Prior to Admission medications   Not on File    Family History No family history on file.  Social History Social History   Tobacco Use   Smoking status: Not on file  Substance Use Topics   Alcohol use: Not on file   Drug use: Not on file     Allergies   Patient has no allergy information on record.   Review of Systems Review of Systems  Unable to perform ROS: Mental status change     Physical Exam Updated Vital Signs BP (!) 199/112 (BP Location: Left Arm)    Pulse (!) 134    Resp (!) 22    Ht 5\' 8"  (1.727 m)    Wt 79.4 kg    SpO2 97%    BMI 26.61 kg/m    Patient Vitals for the past 24 hrs:  BP Pulse Resp SpO2 Height Weight  09/07/18 2200 (!) 192/97 (!) 122 17 96 % -- --  09/07/18 2130  (!) 176/98 (!) 118 16 100 % -- --  09/07/18 2101 (!) 190/106 (!) 135 20 100 % -- --  09/07/18 2100 (!) 209/177 (!) 136 17 98 % 5\' 8"  (1.727 m) 79.4 kg  09/07/18 2059 (!) 199/112 (!) 134 (!) 22 97 % -- --     Physical Exam 2055: Physical examination:  Nursing notes reviewed; Vital signs and O2 SAT reviewed;  Constitutional: Well developed, Well nourished, In no acute distress; Head:  Normocephalic, atraumatic. No outward s/s of trauma to head/face.; Eyes: EOMI, PERRL 2-90mm, No scleral icterus; ENMT: Mouth and pharynx normal, Mucous membranes dry. +nasal trumpet in place.; Neck: Supple, Full range of motion, No lymphadenopathy; Cardiovascular: Tachycardic rate and rhythm, No gallop; Respiratory: Breath sounds clear & equal bilaterally, No wheezes. Normal respiratory effort/excursion; Chest: Nontender, Movement normal; Abdomen: Soft, Nontender, Nondistended, Normal bowel sounds; Genitourinary: No CVA tenderness; Extremities: Peripheral pulses normal, No deformity, No edema, No calf edema or asymmetry.; Neuro:  Laying eyes closed. Responds to painful stimuli (ie: IV stick, COVID testing) by moving all extremities. No facial droop noted. No verbalizations..; Skin: Color normal, Warm, Dry.     ED Treatments / Results  Labs (all labs ordered  are listed, but only abnormal results are displayed)   EKG EKG Interpretation  Date/Time:  Saturday September 07 2018 20:57:43 EDT Ventricular Rate:  135 PR Interval:    QRS Duration: 87 QT Interval:  298 QTC Calculation: 447 R Axis:   29 Text Interpretation:  Sinus tachycardia Atrial premature complex Probable left atrial enlargement S1,S2,S3 pattern RSR' in V1 or V2, right VCD or RVH Nonspecific T abnormalities, lateral leads No old tracing to compare Confirmed by Samuel JesterMcManus, Nigel Ericsson [phone]) on 09/07/2018 9:06:24 PM   Radiology   Procedures Procedures (including critical care time)  Medications Ordered in ED Medications  sodium chloride 0.9 % bolus  1,000 mL (has no administration in time range)  0.9 %  sodium chloride infusion (has no administration in time range)     Initial Impression / Assessment and Plan / ED Course  I have reviewed the triage vital signs and the nursing notes.  Pertinent labs & imaging results that were available during my care of the patient were reviewed by me and considered in my medical decision making (see chart for details).     MDM Reviewed: previous chart, nursing note and vitals Reviewed previous: labs and ECG Interpretation: labs, ECG, x-ray and CT scan Total time providing critical care: 30-74 minutes. This excludes time spent performing separately reportable procedures and services. Consults: admitting MD   CRITICAL CARE Performed by: Samuel JesterKathleen Ahmir Bracken Total critical care time: 40 minutes Critical care time was exclusive of separately billable procedures and treating other patients. Critical care was necessary to treat or prevent imminent or life-threatening deterioration. Critical care was time spent personally by me on the following activities: development of treatment plan with patient and/or surrogate as well as nursing, discussions with consultants, evaluation of patient's response to treatment, examination of patient, obtaining history from patient or surrogate, ordering and performing treatments and interventions, ordering and review of laboratory studies, ordering and review of radiographic studies, pulse oximetry and re-evaluation of patient's condition.   Results for orders placed or performed during the hospital encounter of 09/07/18  SARS Coronavirus 2 (CEPHEID - Performed in [facility] Order   Specimen: Nasopharyngeal Swab  Result Value Ref Range   SARS Coronavirus 2 NEGATIVE NEGATIVE  Acetaminophen level  Result Value Ref Range   Acetaminophen (Tylenol), Serum <10 (L) 10 - 30 ug/mL  Comprehensive metabolic panel  Result Value Ref Range   Sodium 140 135 -  145 mmol/L   Potassium 3.8 3.5 - 5.1 mmol/L   Chloride 105 98 - 111 mmol/L   CO2 23 22 - 32 mmol/L   Glucose, Bld 217 (H) 70 - 99 mg/dL   BUN 30 (H) 6 - 20 mg/dL   Creatinine, Ser 6.040.94 0.44 - 1.00 mg/dL   Calcium 9.3 8.9 - 54.010.3 mg/dL   Total Protein 7.8 6.5 - 8.1 g/dL   Albumin 3.9 3.5 - 5.0 g/dL   AST 23 15 - 41 U/L   ALT 18 0 - 44 U/L   Alkaline Phosphatase 57 38 - 126 U/L   Total Bilirubin 0.4 0.3 - 1.2 mg/dL   GFR calc non Af Amer >60 >60 mL/min   GFR calc Af Amer >60 >60 mL/min   Anion gap 12 5 - 15  Ethanol  Result Value Ref Range   Alcohol, Ethyl (B) <10 <10 mg/dL  Salicylate level  Result Value Ref Range   Salicylate Lvl <7.0 2.8 - 30.0 mg/dL  Lactic acid, plasma  Result Value Ref Range  Lactic Acid, Venous 2.8 (HH) 0.5 - 1.9 mmol/L  CBC with Differential  Result Value Ref Range   WBC 7.7 4.0 - 10.5 K/uL   RBC 3.56 (L) 3.87 - 5.11 MIL/uL   Hemoglobin 6.9 (LL) 12.0 - 15.0 g/dL   HCT 40.9 (L) 81.1 - 91.4 %   MCV 69.4 (L) 80.0 - 100.0 fL   MCH 19.4 (L) 26.0 - 34.0 pg   MCHC 27.9 (L) 30.0 - 36.0 g/dL   RDW 78.2 (H) 95.6 - 21.3 %   Platelets 233 150 - 400 K/uL   nRBC 0.0 0.0 - 0.2 %   Neutrophils Relative % 54 %   Neutro Abs 4.1 1.7 - 7.7 K/uL   Lymphocytes Relative 34 %   Lymphs Abs 2.6 0.7 - 4.0 K/uL   Monocytes Relative 8 %   Monocytes Absolute 0.6 0.1 - 1.0 K/uL   Eosinophils Relative 4 %   Eosinophils Absolute 0.3 0.0 - 0.5 K/uL   Basophils Relative 0 %   Basophils Absolute 0.0 0.0 - 0.1 K/uL   Immature Granulocytes 0 %   Abs Immature Granulocytes 0.02 0.00 - 0.07 K/uL  Protime-INR  Result Value Ref Range   Prothrombin Time 13.3 11.4 - 15.2 seconds   INR 1.0 0.8 - 1.2  I-stat chem 8, ED (not at [facility] or [facility])  Result Value Ref Range   Sodium 140 135 - 145 mmol/L   Potassium 3.8 3.5 - 5.1 mmol/L   Chloride 105 98 - 111 mmol/L   BUN 27 (H) 6 - 20 mg/dL   Creatinine, Ser 0.86 0.44 - 1.00 mg/dL   Glucose, Bld 578 (H) 70 - 99 mg/dL   Calcium, Ion 4.69  6.29 - 1.40 mmol/L   TCO2 23 22 - 32 mmol/L   Hemoglobin 8.5 (L) 12.0 - 15.0 g/dL   HCT 52.8 (L) 41.3 - 24.4 %  POC occult blood, ED  Result Value Ref Range   Fecal Occult Bld POSITIVE (A) NEGATIVE  Type and screen  Result Value Ref Range   ABO/RH(D) A POS    Antibody Screen PENDING    Sample Expiration      09/10/2018,2359 Performed at [facility], [address] [place], [place] [address]   Troponin I (High Sensitivity)  Result Value Ref Range   Troponin I (High Sensitivity) <2.0 <18 ng/L   Ct Head Wo Contrast Result Date: 09/07/2018 CLINICAL DATA:  Altered mental status (AMS), unclear cause. Collapsed. EXAM: CT HEAD WITHOUT CONTRAST CT CERVICAL SPINE WITHOUT CONTRAST TECHNIQUE: Multidetector CT imaging of the head and cervical spine was performed following the standard protocol without intravenous contrast. Multiplanar CT image reconstructions of the cervical spine were also generated. COMPARISON:  None. FINDINGS: CT HEAD FINDINGS Brain: No intracranial hemorrhage, mass effect, or midline shift. Mild generalized atrophy and chronic small vessel ischemia. No hydrocephalus. The basilar cisterns are patent. Remote lacunar infarcts in the basal ganglia and left caudate. Possible small remote infarct in left cerebellum. No evidence of territorial infarct or acute ischemia. No extra-axial or intracranial fluid collection. Vascular: No hyperdense vessel. Skull: No fracture or focal lesion. Sinuses/Orbits: Scattered ethmoid air cell opacification. No sinus fluid levels. Visualized mastoid air cells are clear. Included orbits are unremarkable. Other: None. CT CERVICAL SPINE FINDINGS Mild motion artifact limitation, patient had difficulty tolerating the exam and had to be restrained. Alignment: Normal. Skull base and vertebrae: No displaced fracture, motion limits detailed evaluation at multiple levels, particularly skull base and C1-C2. Soft tissues and spinal  canal: No prevertebral fluid or swelling.  No visible canal hematoma. Upper esophagus is patulous. Disc levels: Diffuse degenerative disc disease. Multilevel facet arthropathy. Upper chest: No acute findings. Other: Carotid calcifications. IMPRESSION: 1. No acute intracranial abnormality. 2. Mild atrophy and chronic small vessel ischemia. 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 3. Motion artifact limits detailed evaluation of the cervical spine. No gross fracture or subluxation. Multilevel degenerative disc disease and facet arthropathy. Electronically Signed   By: Narda RutherfordMelanie  Sanford M.D.   On: 09/07/2018 21:48   Ct Cervical Spine Wo Contrast Result Date: 09/07/2018 CLINICAL DATA:  Altered mental status (AMS), unclear cause. Collapsed. EXAM: CT HEAD WITHOUT CONTRAST CT CERVICAL SPINE WITHOUT CONTRAST TECHNIQUE: Multidetector CT imaging of the head and cervical spine was performed following the standard protocol without intravenous contrast. Multiplanar CT image reconstructions of the cervical spine were also generated. COMPARISON:  None. FINDINGS: CT HEAD FINDINGS Brain: No intracranial hemorrhage, mass effect, or midline shift. Mild generalized atrophy and chronic small vessel ischemia. No hydrocephalus. The basilar cisterns are patent. Remote lacunar infarcts in the basal ganglia and left caudate. Possible small remote infarct in left cerebellum. No evidence of territorial infarct or acute ischemia. No extra-axial or intracranial fluid collection. Vascular: No hyperdense vessel. Skull: No fracture or focal lesion. Sinuses/Orbits: Scattered ethmoid air cell opacification. No sinus fluid levels. Visualized mastoid air cells are clear. Included orbits are unremarkable. Other: None. CT CERVICAL SPINE FINDINGS Mild motion artifact limitation, patient had difficulty tolerating the exam and had to be restrained. Alignment: Normal. Skull base and vertebrae: No displaced fracture, motion limits detailed evaluation at multiple levels, particularly skull base  and C1-C2. Soft tissues and spinal canal: No prevertebral fluid or swelling. No visible canal hematoma. Upper esophagus is patulous. Disc levels: Diffuse degenerative disc disease. Multilevel facet arthropathy. Upper chest: No acute findings. Other: Carotid calcifications. IMPRESSION: 1. No acute intracranial abnormality. 2. Mild atrophy and chronic small vessel ischemia. 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 3. Motion artifact limits detailed evaluation of the cervical spine. No gross fracture or subluxation. Multilevel degenerative disc disease and facet arthropathy. Electronically Signed   By: Narda RutherfordMelanie  Sanford M.D.   On: 09/07/2018 21:48   Dg Chest Port 1 View Result Date: 09/07/2018 CLINICAL DATA:  Unresponsive. EXAM: PORTABLE CHEST 1 VIEW COMPARISON:  None. FINDINGS: The heart size and mediastinal contours are within normal limits. Both lungs are clear. The visualized skeletal structures are unremarkable. IMPRESSION: No active disease. Electronically Signed   By: Katherine Mantlehristopher  Green M.D.   On: 09/07/2018 21:59    Mady HaagensenJennifer McNalt was evaluated in Emergency Department on 09/07/2018 for the symptoms described in the history of present illness. She was evaluated in the context of the global COVID-19 pandemic, which necessitated consideration that the patient might be at risk for infection with the SARS-CoV-2 virus that causes COVID-19. Institutional protocols and algorithms that pertain to the evaluation of patients at risk for COVID-19 are in a state of rapid change based on information released by regulatory bodies including the CDC and federal and state organizations. These policies and algorithms were followed during the patient's care in the ED.   2220:  Hgb 6.9, stool heme positive; will transfuse 1 unit PRBC's. IVF judicious bolus given [facility] Arsenio LoaderSommer, case discussed, including:  HPI, pertinent PM/SHx, VS/PE, dx testing, ED course and treatment:  Requests Tele Neuro  MD for further recommendations, obtain ABG, await UA/UDS, call back after consult/results.   2225:  Tele Neuro Dr. Demetrio Lapping has evaluated pt in the ED: he agrees pt does move all extremities at this time, unclear cause for unresponsiveness at this time, doubt seizure and no need to start anti-epileptic med at this time, requests to obtain CTA head/neck/perfusion, pt will need admission.   2340:  Tele Neuro Dr. Demetrio Lapping has viewed the CT images: states no LVO, no acute neuro intervention needed at this time. Awaiting CTA reading. Pt will need admit. Sign out to Dr. Manus Gunning.       Final Clinical Impressions(s) / ED Diagnoses   Final diagnoses:  None    ED Discharge Orders    None       Samuel Jester, DO 09/07/18 2353

## 2018-09-07 NOTE — ED Notes (Signed)
Tele neuro consult being performed at present time,

## 2018-09-07 NOTE — Progress Notes (Signed)
Patient on 4 lpm, nasal trumpet removed. Patient has gag reflex with yanker suction . Still does not arouse does move and try to set up with cough.  Had stool on bottom fell in grass.BP very high Stroke? Overdose?

## 2018-09-07 NOTE — ED Notes (Signed)
Blood gas drawn on room air -- patient was on 4 lpm/[place] when arrived. Turned off by ??? Saturation 95 on monitor. Cannula left in nose.

## 2018-09-07 NOTE — ED Triage Notes (Signed)
EMS was called out for CPR. Pulses were present upon arrival. Group home states she took her trazodone, went outside to smoke a cigarette, and went unresponsive. Pt had pinpoint pupils, RR was 6/min, gave 2mg  narcan, RR went to 18. CBG 161. Pt withdrawal from pain with IV and nasal trumpet

## 2018-09-07 NOTE — Consult Note (Addendum)
[facility] Tele[facility]urology Consult Services  Stat Consult  Date of Service:   09/07/2018 22:01:00  Impression:     .  Rule Out Acute Ischemic Stroke     .  Encephalopathy  Comments/Sign-Out: 54 y/o woman presents with AMS from her group home. Patient was reportedly given CPR while at her nursing home. Last known well is unknown as per ED attending. ED staff unable to obtain information from group home at this time. CTA head/neck and perfusion pending to evaluate LVO.  CT HEAD: Reviewed  Metrics: [employer] Notification Time: 09/07/2018 22:00:28 Stamp Time: 09/07/2018 22:01:00 Callback Response Time: 09/07/2018 22:08:05 Video Start Time: 09/07/2018 22:15:58 Video End Time: 09/07/2018 22:29:39  Our recommendations are outlined below.  Recommendations:     .  Hold ASA until cleared by GI  Imaging Studies:     .  MRI Head Without Contrast     .  Echocardiogram - Transthoracic Echocardiogram  Therapies:     .  Physical Therapy, Occupational Therapy, Speech Therapy Assessment When Applicable  Other WorkUp:     .  Infectious/metabolic workup per primary team  Disposition: Neurology Follow Up Recommended  Sign Out:     .  Discussed with Emergency Department Provider  ----------------------------------------------------------------------------------------------------  Chief Complaint: AMS  History of Present Illness: Patient is a 53 year old Female.  53 y/o woman presents with AMS from her group home. Last known well is unknown as per ED attending. Patient is anemic with a fecal occult blood that is positive. CT head reviewed and case discussed with hospitalist. Patient is lethargic and moving all extremities spontaneously and withdraws to noxious stimuli. COVID testing pending.  Examination: 1A: Level of Consciousness - Movements to Pain + 2 1B: Ask Month and Age - Could Not Answer Either Question Correctly + 2 1C: Blink Eyes & Squeeze Hands -  Performs 0 Tasks + 2 2: Test Horizontal Extraocular Movements - Normal + 0 3: Test Visual Fields - No Visual Loss + 0 4: Test Facial Palsy (Use Grimace if Obtunded) - Normal symmetry + 0 5A: Test Left Arm Motor Drift - Drift, hits bed + 2 5B: Test Right Arm Motor Drift - Drift, hits bed + 2 6A: Test Left Leg Motor Drift - Drift, hits bed + 2 6B: Test Right Leg Motor Drift - Drift, hits bed + 2 7: Test Limb Ataxia (FNF/Heel-Shin) - Does Not Understand + 0 8: Test Sensation - Normal; No sensory loss + 0 9: Test Language/Aphasia - Mute/Global Aphasia: No Usable Speech/Auditory Comprehension + 3 10: Test Dysarthria - Mute/Anarthric + 2 11: Test Extinction/Inattention - No abnormality + 0  NIHSS Score: 19   Due to the immediate potential for life-threatening deterioration due to underlying acute neurologic illness, I spent 20 minutes providing critical care. This time includes time for face to face visit via telemedicine, review of medical records, imaging studies and discussion of findings with providers, the patient and/or family.   Dr Meryl Crutch   [facility] [phone]   Case [id number]  Addendum - Advanced neuroimaging reviewed and discussed ED attending. No proximal intracranial LVO as per my review. Not a thrombectomy candidate. Case discussed with Dr. Jeannine Boga (radiologist) Please call with any questions.

## 2018-09-07 NOTE — ED Notes (Signed)
CRITICAL VALUE ALERT  Critical Value:  Hemoglobin 6.9  Date & Time Notied:  09/07/2018 2122  Provider Notified: Dr. Thurnell Garbe  Orders Received/Actions taken: n/a

## 2018-09-07 NOTE — ED Notes (Signed)
Date and time results received: 09/07/18 21:48 (use smartphrase ".now" to insert current time)  Test: lactic acid  Critical Value: 2.8  Name of Provider Notified: Dr Thurnell Garbe  Orders Received? Or Actions Taken?: see mar

## 2018-09-07 NOTE — Progress Notes (Signed)
Patient not ready for exam @ 22:57. Patient having blood gas drawn @ 22:57

## 2018-09-08 DIAGNOSIS — I10 Essential (primary) hypertension: Secondary | ICD-10-CM | POA: Diagnosis present

## 2018-09-08 DIAGNOSIS — D649 Anemia, unspecified: Secondary | ICD-10-CM | POA: Diagnosis present

## 2018-09-08 DIAGNOSIS — Z7984 Long term (current) use of oral hypoglycemic drugs: Secondary | ICD-10-CM | POA: Diagnosis not present

## 2018-09-08 DIAGNOSIS — R4020 Unspecified coma: Secondary | ICD-10-CM | POA: Diagnosis present

## 2018-09-08 DIAGNOSIS — G9340 Encephalopathy, unspecified: Secondary | ICD-10-CM | POA: Diagnosis present

## 2018-09-08 DIAGNOSIS — G9341 Metabolic encephalopathy: Secondary | ICD-10-CM | POA: Diagnosis present

## 2018-09-08 DIAGNOSIS — M25462 Effusion, left knee: Secondary | ICD-10-CM | POA: Diagnosis present

## 2018-09-08 DIAGNOSIS — Z7951 Long term (current) use of inhaled steroids: Secondary | ICD-10-CM | POA: Diagnosis not present

## 2018-09-08 DIAGNOSIS — Z8673 Personal history of transient ischemic attack (TIA), and cerebral infarction without residual deficits: Secondary | ICD-10-CM | POA: Diagnosis not present

## 2018-09-08 DIAGNOSIS — F17210 Nicotine dependence, cigarettes, uncomplicated: Secondary | ICD-10-CM | POA: Diagnosis not present

## 2018-09-08 DIAGNOSIS — W1789XA Other fall from one level to another, initial encounter: Secondary | ICD-10-CM | POA: Diagnosis not present

## 2018-09-08 DIAGNOSIS — Z1159 Encounter for screening for other viral diseases: Secondary | ICD-10-CM | POA: Diagnosis not present

## 2018-09-08 DIAGNOSIS — R29719 NIHSS score 19: Secondary | ICD-10-CM | POA: Diagnosis present

## 2018-09-08 DIAGNOSIS — R7303 Prediabetes: Secondary | ICD-10-CM | POA: Diagnosis present

## 2018-09-08 DIAGNOSIS — F329 Major depressive disorder, single episode, unspecified: Secondary | ICD-10-CM | POA: Diagnosis present

## 2018-09-08 DIAGNOSIS — Z7982 Long term (current) use of aspirin: Secondary | ICD-10-CM | POA: Diagnosis not present

## 2018-09-08 DIAGNOSIS — Z79899 Other long term (current) drug therapy: Secondary | ICD-10-CM | POA: Diagnosis not present

## 2018-09-08 DIAGNOSIS — M1712 Unilateral primary osteoarthritis, left knee: Secondary | ICD-10-CM | POA: Diagnosis present

## 2018-09-08 DIAGNOSIS — R4182 Altered mental status, unspecified: Secondary | ICD-10-CM | POA: Diagnosis present

## 2018-09-08 DIAGNOSIS — R32 Unspecified urinary incontinence: Secondary | ICD-10-CM | POA: Diagnosis present

## 2018-09-08 DIAGNOSIS — D72829 Elevated white blood cell count, unspecified: Secondary | ICD-10-CM | POA: Diagnosis not present

## 2018-09-08 LAB — POCT I-STAT 7, (LYTES, BLD GAS, ICA,H+H)
Acid-Base Excess: 1 mmol/L (ref 0.0–2.0)
Bicarbonate: 24.4 mmol/L (ref 20.0–28.0)
Calcium, Ion: 1.24 mmol/L (ref 1.15–1.40)
HCT: 27 % — ABNORMAL LOW (ref 36.0–46.0)
Hemoglobin: 9.2 g/dL — ABNORMAL LOW (ref 12.0–15.0)
O2 Saturation: 96 %
Patient temperature: 37.6
Potassium: 3.7 mmol/L (ref 3.5–5.1)
Sodium: 141 mmol/L (ref 135–145)
TCO2: 25 mmol/L (ref 22–32)
pCO2 arterial: 34.9 mmHg (ref 32.0–48.0)
pH, Arterial: 7.456 — ABNORMAL HIGH (ref 7.350–7.450)
pO2, Arterial: 77 mmHg — ABNORMAL LOW (ref 83.0–108.0)

## 2018-09-08 LAB — TYPE AND SCREEN
ABO/RH(D): A POS
Antibody Screen: NEGATIVE

## 2018-09-08 LAB — BASIC METABOLIC PANEL
Anion gap: 11 (ref 5–15)
BUN: 19 mg/dL (ref 6–20)
CO2: 24 mmol/L (ref 22–32)
Calcium: 9.1 mg/dL (ref 8.9–10.3)
Chloride: 103 mmol/L (ref 98–111)
Creatinine, Ser: 0.94 mg/dL (ref 0.44–1.00)
GFR calc Af Amer: >60 mL/min (ref >60)
GFR calc non Af Amer: >60 mL/min (ref >60)
Glucose, Bld: 175 mg/dL — ABNORMAL HIGH (ref 70–99)
Potassium: 3.6 mmol/L (ref 3.5–5.1)
Sodium: 138 mmol/L (ref 135–145)

## 2018-09-08 LAB — MAGNESIUM: Magnesium: 1.4 mg/dL — ABNORMAL LOW (ref 1.7–2.4)

## 2018-09-08 LAB — TROPONIN I (HIGH SENSITIVITY)
Troponin I (High Sensitivity): 23 ng/L — ABNORMAL HIGH (ref <18)
Troponin I (High Sensitivity): 3 ng/L (ref <18)
Troponin I (High Sensitivity): 3 ng/L (ref <18)

## 2018-09-08 LAB — HEMOGLOBIN A1C
Hgb A1c MFr Bld: 5.8 % — ABNORMAL HIGH (ref 4.8–5.6)
Mean Plasma Glucose: 119.76 mg/dL

## 2018-09-08 LAB — AMMONIA: Ammonia: 16 umol/L (ref 9–35)

## 2018-09-08 LAB — PHOSPHORUS: Phosphorus: 3.2 mg/dL (ref 2.5–4.6)

## 2018-09-08 LAB — ABO/RH: ABO/RH(D): A POS

## 2018-09-08 LAB — HIV ANTIBODY (ROUTINE TESTING W REFLEX): HIV Screen 4th Generation wRfx: NONREACTIVE

## 2018-09-08 LAB — GLUCOSE, CAPILLARY
Glucose-Capillary: 199 mg/dL — ABNORMAL HIGH (ref 70–99)
Glucose-Capillary: 152 mg/dL — ABNORMAL HIGH (ref 70–99)
Glucose-Capillary: 95 mg/dL (ref 70–99)
Glucose-Capillary: 109 mg/dL — ABNORMAL HIGH (ref 70–99)
Glucose-Capillary: 93 mg/dL (ref 70–99)
Glucose-Capillary: 98 mg/dL (ref 70–99)

## 2018-09-08 LAB — MRSA PCR SCREENING: MRSA by PCR: NEGATIVE

## 2018-09-08 LAB — LACTIC ACID, PLASMA: Lactic Acid, Venous: 1.7 mmol/L (ref 0.5–1.9)

## 2018-09-08 MED ORDER — ENOXAPARIN SODIUM 40 MG/0.4ML ~~LOC~~ SOLN: 40.0000 mg | SUBCUTANEOUS | Status: DC

## 2018-09-08 MED ORDER — INSULIN ASPART 100 UNIT/ML ~~LOC~~ SOLN
0.0000 [IU] | SUBCUTANEOUS | Status: DC
  Administered 2018-09-08: 3 [IU] via SUBCUTANEOUS

## 2018-09-08 MED ORDER — ATORVASTATIN CALCIUM 40 MG PO TABS
40.0000 mg | ORAL_TABLET | Freq: Every evening | ORAL | Status: DC
  Administered 2018-09-08 – 2018-09-13 (×6): 40 mg via ORAL
  Filled 2018-09-08 (×6): qty 1

## 2018-09-08 MED ORDER — AMLODIPINE BESYLATE 5 MG PO TABS
5.0000 mg | ORAL_TABLET | Freq: Every morning | ORAL | Status: DC
  Administered 2018-09-09 – 2018-09-13 (×5): 5 mg via ORAL
  Filled 2018-09-08 (×6): qty 1

## 2018-09-08 MED ORDER — CHLORHEXIDINE GLUCONATE CLOTH 2 % EX PADS
6.0000 | MEDICATED_PAD | Freq: Every day | CUTANEOUS | Status: DC
  Administered 2018-09-08 – 2018-09-09 (×2): 6 via TOPICAL

## 2018-09-08 MED ORDER — LISINOPRIL 20 MG PO TABS
20.0000 mg | ORAL_TABLET | Freq: Every morning | ORAL | Status: DC
  Administered 2018-09-09 – 2018-09-13 (×5): 20 mg via ORAL
  Filled 2018-09-08 (×5): qty 1

## 2018-09-08 MED ORDER — INSULIN ASPART 100 UNIT/ML ~~LOC~~ SOLN
0.0000 [IU] | Freq: Three times a day (TID) | SUBCUTANEOUS | Status: DC
  Administered 2018-09-09: 1 [IU] via SUBCUTANEOUS
  Administered 2018-09-09: 5 [IU] via SUBCUTANEOUS
  Administered 2018-09-10: 3 [IU] via SUBCUTANEOUS
  Administered 2018-09-11 – 2018-09-12 (×2): 2 [IU] via SUBCUTANEOUS
  Administered 2018-09-12: 3 [IU] via SUBCUTANEOUS
  Administered 2018-09-13 (×3): 5 [IU] via SUBCUTANEOUS

## 2018-09-08 MED ORDER — LACTATED RINGERS IV SOLN
INTRAVENOUS | Status: DC
  Administered 2018-09-08 – 2018-09-10 (×4): via INTRAVENOUS

## 2018-09-08 MED ORDER — FLUTICASONE FUROATE-VILANTEROL 200-25 MCG/INH IN AEPB
1.0000 | INHALATION_SPRAY | Freq: Every day | RESPIRATORY_TRACT | Status: DC
  Filled 2018-09-08: qty 28

## 2018-09-08 MED ORDER — ASPIRIN EC 81 MG PO TBEC
81.0000 mg | DELAYED_RELEASE_TABLET | Freq: Two times a day (BID) | ORAL | Status: DC
  Administered 2018-09-08 – 2018-09-09 (×2): 81 mg via ORAL
  Filled 2018-09-08 (×2): qty 1

## 2018-09-08 NOTE — Progress Notes (Signed)
[place] Progress Note Patient Name: [name] [name] DOB: 20-Mar-1965 MRN: [id number]   Date of Service  09/08/2018  HPI/Events of Note  Request to change VBG to ABG.  eICU Interventions  Will order: 1. D/C VBG. 2. ABG now.      Intervention Category Major Interventions: Other:  Sommer,Steven [name] 09/08/2018, 5:50 AM

## 2018-09-08 NOTE — Consult Note (Addendum)
NEUROLOGY CONSULT  Reason for Consult:Neurologic opinion for AMS Referring Physician: Dr Arsenio Loader  CC: "I want to sleep; leave me alone"  HPI: [name] [name] is an 53 y.o. female with a long, complex psych history who lives in a group home. PMH includes depression, HTN, incontinence, prev stroke by report. On 7/18 the pt tells me she went out to smoke and fell off the porch step. (This was not witnessed nor reported previously.) She says that is that last she remembers; denies feeling dizzy at that time. She knows she is now in the hospital, but not which one or why exactly. The group home called EMS when she was found outside unresponsive. EMS was summoned and she was found to be breathing slowly 6/min and "pinpoint pupils". OD was suspected and she was given narcan and transported to [facility] ER where she was still poorly responsive, but was not intubated. Teleneurology was consulted for the unresponsive state at [facility]. Per [facility] ER notes, she was moving all extremities. CT/CTA/CTP were done and did not show a LVO and acute findings. She was transferred to [facility] for ICU care. To note: she was found to be rather anemic with Hgb 6.9 and was transfused 1u PRBCs and given IVF bolus. Hem+ stool; but no previous GI complaints.  Neurology was consulted to follow up on teleneurology consult and assess ongoing AMS. The pt was just transferred from ICU to floor bed upon my arrival. She easily awakens to voice, answers questions and gives details to her HPI as above. While she does have an odd affect and is sleepy during exam, states she feels very "tired" and asks to be left alone, she is clearly no longer obtunded or unresponsive. There are no focal deficits.   Past Medical History Past Medical History:  Diagnosis Date  . CVA (cerebral vascular accident) (HCC)   . Depression   . Hypertension   . Incontinence     Past Surgical History History reviewed. No pertinent surgical history.  Family History No family history  on file.  Social History    has no history on file for tobacco, alcohol, and drug.  Allergies No Known Allergies  Home Medications Medications Prior to Admission  Medication Sig Dispense Refill  . amLODipine (NORVASC) 5 MG tablet Take 5 mg by mouth every morning.     Marland Kitchen aspirin EC 81 MG tablet Take 81 mg by mouth 2 (two) times a day.     Marland Kitchen atorvastatin (LIPITOR) 40 MG tablet Take 40 mg by mouth every evening.    . busPIRone (BUSPAR) 10 MG tablet Take 10 mg by mouth 3 (three) times daily.    . celecoxib (CELEBREX) 100 MG capsule Take 100 mg by mouth 2 (two) times daily.    . cholecalciferol (VITAMIN D3) 25 MCG (1000 UT) tablet Take 1,000 Units by mouth every morning.    . Fluticasone-Salmeterol (ADVAIR) 250-50 MCG/DOSE AEPB Inhale 1 puff into the lungs 2 (two) times daily.    . furosemide (LASIX) 20 MG tablet Take 20 mg by mouth 2 (two) times daily.    Marland Kitchen lisinopril (ZESTRIL) 20 MG tablet Take 20 mg by mouth every morning.     . metFORMIN (GLUCOPHAGE-XR) 500 MG 24 hr tablet Take 500 mg by mouth 2 (two) times a day.     . potassium chloride (K-DUR) 10 MEQ tablet Take 10 mEq by mouth every morning.    . traZODone (DESYREL) 100 MG tablet Take 100 mg by mouth at bedtime.  Hospital Medications . amLODipine  5 mg Oral q morning - 10a  . aspirin EC  81 mg Oral BID  . atorvastatin  40 mg Oral QPM  . Chlorhexidine Gluconate Cloth  6 each Topical Q0600  . insulin aspart  0-15 Units Subcutaneous TID WC  . lisinopril  20 mg Oral q morning - 10a     ROS: History obtained from pt; pt declines most questions but denies any pain or discomforts  Physical Examination:  Vitals:   09/08/18 1300 09/08/18 1500 09/08/18 1600 09/08/18 1642  BP: (!) 151/82  127/72 (!) 146/81  Pulse: 86 89 83 83  Resp: 17 (!) 42 19 18  Temp: (!) 100.4 F (38 C) (!) 100.4 F (38 C) (!) 100.4 F (38 C) 98.5 F (36.9 C)  TempSrc:   Bladder Oral  SpO2: 100% 100% 100% 95%  Weight:      Height:        General  - disheveled, unkept. No acute distress Heart - Regular rate and rhythm - no murmer Lungs - Clear to auscultation Abdomen - Soft - non tender Extremities - Distal pulses intact - no edema Skin - Warm and dry Psych- odd affect  Neurologic Examination:   Mental Status:  Sleepy, but awakes easily to voice. Orients to name, DOB/age. Says month is June, but then self corrects to "July, [birth year]" when asked the year.  She knows she is now in the hospital, but not which one or why exactly. She is missing teeth and is somewhat dysarthric, but overall speech is fluent and without aphasia. Able to follow 3 step commands without difficulty.  Cranial Nerves:  II-bilateral visual fields intact III/IV/VI-Pupils were equal and reacted. Extraocular movements were full.  V/VII-no facial numbness and no facial weakness.  VIII-hearing normal.  X-normal speech and symmetrical palatal movement.  XII-midline tongue extension  Motor: Right : Upper extremity   5/5    Left:     Upper extremity   5/5  Lower extremity   5/5     Lower extremity   5/5 Tone and bulk:normal tone throughout; no atrophy noted Sensory: Intact to light touch in all extremities. Deep Tendon Reflexes: 2/4 throughout Plantars: Downgoing bilaterally  Cerebellar: Normal finger to nose and heel to shin bilaterally. Gait: not tested   LABORATORY STUDIES:  Basic Metabolic Panel: Recent Labs  Lab 09/07/18 2059 09/07/18 2103 09/08/18 0532 09/08/18 0612  NA 140 140 138 141  K 3.8 3.8 3.6 3.7  CL 105 105 103  --   CO2 23  --  24  --   GLUCOSE 217* 214* 175*  --   BUN 30* 27* 19  --   CREATININE 0.94 1.00 0.94  --   CALCIUM 9.3  --  9.1  --   MG  --   --  1.4*  --   PHOS  --   --  3.2  --     Liver Function Tests: Recent Labs  Lab 09/07/18 2059  AST 23  ALT 18  ALKPHOS 57  BILITOT 0.4  PROT 7.8  ALBUMIN 3.9   No results for input(s): LIPASE, AMYLASE in the last 168 hours. Recent Labs  Lab 09/08/18 0532  AMMONIA 16     CBC: Recent Labs  Lab 09/07/18 2059 09/07/18 2103 09/08/18 0612  WBC 7.7  --   --   NEUTROABS 4.1  --   --   HGB 6.9* 8.5* 9.2*  HCT 24.7* 25.0* 27.0*  MCV 69.4*  --   --  PLT 233  --   --     Cardiac Enzymes: No results for input(s): CKTOTAL, CKMB, CKMBINDEX, TROPONINI in the last 168 hours.  BNP: Invalid input(s): POCBNP  CBG: Recent Labs  Lab 09/08/18 0351 09/08/18 0532 09/08/18 0809 09/08/18 1202 09/08/18 1609  GLUCAP 199* 152* 95 109* 93    Microbiology:   Coagulation Studies: Recent Labs    09/07/18 2059  LABPROT 13.3  INR 1.0    Urinalysis:  Recent Labs  Lab 09/07/18 2059  COLORURINE YELLOW  LABSPEC 1.015  PHURINE 5.0  GLUCOSEU NEGATIVE  HGBUR NEGATIVE  BILIRUBINUR NEGATIVE  KETONESUR NEGATIVE  PROTEINUR NEGATIVE  NITRITE NEGATIVE  LEUKOCYTESUR NEGATIVE    Lipid Panel:  No results found for: CHOL, TRIG, HDL, CHOLHDL, VLDL, LDLCALC  HgbA1C:  Lab Results  Component Value Date   HGBA1C 5.8 (H) 09/08/2018    Urine Drug Screen:      Component Value Date/Time   LABOPIA NONE DETECTED 09/07/2018 2059   COCAINSCRNUR NONE DETECTED 09/07/2018 2059   LABBENZ NONE DETECTED 09/07/2018 2059   AMPHETMU NONE DETECTED 09/07/2018 2059   THCU NONE DETECTED 09/07/2018 2059   LABBARB NONE DETECTED 09/07/2018 2059     Alcohol Level:  Recent Labs  Lab 09/07/18 2059  ETH <10    Miscellaneous labs:  EKG  EKG  IMAGING: Ct Code Stroke Cta Head W/wo Contrast  Result Date: 09/08/2018 EXAM: CT ANGIOGRAPHY HEAD AND NECK CT PERFUSION BRAIN TECHNIQUE: Multidetector CT imaging of the head and neck was performed using the standard protocol during bolus administration of intravenous contrast. Multiplanar CT image reconstructions and MIPs were obtained to evaluate the vascular anatomy. Carotid stenosis measurements (when applicable) are obtained utilizing NASCET criteria, using the distal internal carotid diameter as the denominator. Multiphase CT  imaging of the brain was performed following IV bolus contrast injection. Subsequent parametric perfusion maps were calculated using RAPID software. CONTRAST:  150mL OMNIPAQUE IOHEXOL 350 MG/ML SOLN COMPARISON:  Prior CT from earlier the same day. 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 Left common carotid artery arises first, followed by the right common carotid artery, and then the right subclavian artery. The left subclavian artery arises from the proximal left common carotid artery (left brachiocephalic artery). Mild atheromatous plaque about the proximal subclavian arteries bilaterally without flow-limiting stenosis. No other hemodynamically significant stenosis or other acute finding about the arch or origin of the great vessels. Right carotid system: Right common carotid artery patent from its origin to the bifurcation without stenosis. Mild eccentric calcified plaque about the right bifurcation/proximal right ICA without hemodynamically significant stenosis. Right ICA patent distally to the skull base without stenosis, dissection or occlusion. Left carotid system: Left common carotid artery patent from its origin to the bifurcation without stenosis. No significant atheromatous narrowing about the left bifurcation. Left ICA patent distally to the skull base without stenosis, dissection, or occlusion. Vertebral arteries: Both of the vertebral arteries arise from the subclavian arteries. Right vertebral artery dominant. Vertebral arteries patent within the neck without stenosis, dissection, or occlusion. Skeleton: No acute osseous finding. No discrete lytic or blastic osseous lesions. Other neck: No other acute soft tissue abnormality within the neck. Salivary glands within normal limits. Thyroid normal. No adenopathy. Upper chest: Visualized upper chest demonstrates no acute finding. Scattered coronary artery calcifications noted. Review of  the MIP images confirms the above findings CTA HEAD FINDINGS Anterior circulation: Evaluation of the intracranial circulation limited by timing of the contrast  bolus as well as motion artifact. Petrous segments widely patent. Mild scattered atheromatous plaque within the cavernous/supraclinoid ICAs without hemodynamically significant stenosis. A1 segments patent bilaterally. Normal anterior communicating artery complex. Anterior cerebral arteries grossly patent to their distal aspects without stenosis. No M1 stenosis or occlusion. Normal MCA bifurcations. Distal MCA branches well perfused and symmetric. Posterior circulation: Dominant right vertebral artery patent to the vertebrobasilar junction without stenosis. Right PICA patent proximally. Hypoplastic left vertebral artery divides at the takeoff of the left PICA, with a small branch ascending towards the vertebrobasilar junction. Left PICA patent proximally. Basilar patent to its distal aspect without appreciable stenosis. Superior cerebral arteries patent proximally. Left PCA predominantly supplied via the basilar. Predominant fetal type origin of the right PCA supplied via a robust right posterior communicating artery. PCAs perfused to their distal aspects without appreciable stenosis. Venous sinuses: Patent. Anatomic variants: Fetal type origin of the right PCA. Dominant right vertebral artery. Delayed phase: Not performed. Review of the MIP images confirms the above findings CT Brain Perfusion Findings: ASPECTS: No aspects score determine. CBF (<30%) Volume: 0mL Perfusion (Tmax>6.0s) volume: 96mL Mismatch Volume: 96mL Infarction Location:Negative CT perfusion for acute core infarct. Apparent scattered ill-defined perfusion abnormality involving both cerebral hemispheres felt to be artifactual nature due to motion artifact on this exam. IMPRESSION: 1. Negative CTA for emergent large vessel occlusion. 2. Negative CT perfusion for acute core infarct. Apparent  scattered perfusion abnormality involving both cerebral hemispheres felt to be secondary to motion artifact. 3. Mild atherosclerotic change about the right carotid bifurcation and carotid siphons without hemodynamically significant or correctable stenosis. 4. 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 Critical Value/emergent results were discussed by telephone at the time of interpretation on 09/07/2018 at 11:30 pm to Dr. Verdie MosherLiu, who verbally acknowledged these results. Electronically Signed   By: Rise MuBenjamin  McClintock M.D.   On: 09/08/2018 00:08   Ct Head Wo Contrast  Result Date: 09/07/2018 CLINICAL DATA:  Altered mental status (AMS), unclear cause. Collapsed. EXAM: CT HEAD WITHOUT CONTRAST CT CERVICAL SPINE WITHOUT CONTRAST TECHNIQUE: Multidetector CT imaging of the head and cervical spine was performed following the standard protocol without intravenous contrast. Multiplanar CT image reconstructions of the cervical spine were also generated. COMPARISON:  None. FINDINGS: CT HEAD FINDINGS Brain: No intracranial hemorrhage, mass effect, or midline shift. Mild generalized atrophy and chronic small vessel ischemia. No hydrocephalus. The basilar cisterns are patent. Remote lacunar infarcts in the basal ganglia and left caudate. Possible small remote infarct in left cerebellum. No evidence of territorial infarct or acute ischemia. No extra-axial or intracranial fluid collection. Vascular: No hyperdense vessel. Skull: No fracture or focal lesion. Sinuses/Orbits: Scattered ethmoid air cell opacification. No sinus fluid levels. Visualized mastoid air cells are clear. Included orbits are unremarkable. Other: None. CT CERVICAL SPINE FINDINGS Mild motion artifact limitation, patient had difficulty tolerating the exam and had to be restrained. Alignment: Normal. Skull base and vertebrae: No displaced fracture, motion limits detailed evaluation at multiple levels, particularly skull base and C1-C2. Soft  tissues and spinal canal: No prevertebral fluid or swelling. No visible canal hematoma. Upper esophagus is patulous. Disc levels: Diffuse degenerative disc disease. Multilevel facet arthropathy. Upper chest: No acute findings. Other: Carotid calcifications. IMPRESSION: 1. No acute intracranial abnormality. 2. Mild atrophy and chronic small vessel ischemia. 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 3. Motion artifact limits detailed evaluation of the cervical spine. No gross fracture or subluxation. Multilevel degenerative disc disease and facet arthropathy. Electronically Signed   By:  Keith Rake M.D.   On: 09/07/2018 21:48   Ct Code Stroke Cta Neck W/wo Contrast  Result Date: 09/08/2018 EXAM: CT ANGIOGRAPHY HEAD AND NECK CT PERFUSION BRAIN TECHNIQUE: Multidetector CT imaging of the head and neck was performed using the standard protocol during bolus administration of intravenous contrast. Multiplanar CT image reconstructions and MIPs were obtained to evaluate the vascular anatomy. Carotid stenosis measurements (when applicable) are obtained utilizing NASCET criteria, using the distal internal carotid diameter as the denominator. Multiphase CT imaging of the brain was performed following IV bolus contrast injection. Subsequent parametric perfusion maps were calculated using RAPID software. CONTRAST:  162mL OMNIPAQUE IOHEXOL 350 MG/ML SOLN COMPARISON:  Prior CT from earlier the same day. 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 Left common carotid artery arises first, followed by the right common carotid artery, and then the right subclavian artery. The left subclavian artery arises from the proximal left common carotid artery (left brachiocephalic artery). Mild atheromatous plaque about the proximal subclavian arteries bilaterally without flow-limiting stenosis. No other hemodynamically significant stenosis or other acute finding about  the arch or origin of the great vessels. Right carotid system: Right common carotid artery patent from its origin to the bifurcation without stenosis. Mild eccentric calcified plaque about the right bifurcation/proximal right ICA without hemodynamically significant stenosis. Right ICA patent distally to the skull base without stenosis, dissection or occlusion. Left carotid system: Left common carotid artery patent from its origin to the bifurcation without stenosis. No significant atheromatous narrowing about the left bifurcation. Left ICA patent distally to the skull base without stenosis, dissection, or occlusion. Vertebral arteries: Both of the vertebral arteries arise from the subclavian arteries. Right vertebral artery dominant. Vertebral arteries patent within the neck without stenosis, dissection, or occlusion. Skeleton: No acute osseous finding. No discrete lytic or blastic osseous lesions. Other neck: No other acute soft tissue abnormality within the neck. Salivary glands within normal limits. Thyroid normal. No adenopathy. Upper chest: Visualized upper chest demonstrates no acute finding. Scattered coronary artery calcifications noted. Review of the MIP images confirms the above findings CTA HEAD FINDINGS Anterior circulation: Evaluation of the intracranial circulation limited by timing of the contrast bolus as well as motion artifact. Petrous segments widely patent. Mild scattered atheromatous plaque within the cavernous/supraclinoid ICAs without hemodynamically significant stenosis. A1 segments patent bilaterally. Normal anterior communicating artery complex. Anterior cerebral arteries grossly patent to their distal aspects without stenosis. No M1 stenosis or occlusion. Normal MCA bifurcations. Distal MCA branches well perfused and symmetric. Posterior circulation: Dominant right vertebral artery patent to the vertebrobasilar junction without stenosis. Right PICA patent proximally. Hypoplastic left  vertebral artery divides at the takeoff of the left PICA, with a small branch ascending towards the vertebrobasilar junction. Left PICA patent proximally. Basilar patent to its distal aspect without appreciable stenosis. Superior cerebral arteries patent proximally. Left PCA predominantly supplied via the basilar. Predominant fetal type origin of the right PCA supplied via a robust right posterior communicating artery. PCAs perfused to their distal aspects without appreciable stenosis. Venous sinuses: Patent. Anatomic variants: Fetal type origin of the right PCA. Dominant right vertebral artery. Delayed phase: Not performed. Review of the MIP images confirms the above findings CT Brain Perfusion Findings: ASPECTS: No aspects score determine. CBF (<30%) Volume: 2mL Perfusion (Tmax>6.0s) volume: 61mL Mismatch Volume: 2mL Infarction Location:Negative CT perfusion for acute core infarct. Apparent scattered ill-defined perfusion abnormality involving both cerebral hemispheres felt to be artifactual nature due to motion  artifact on this exam. IMPRESSION: 1. Negative CTA for emergent large vessel occlusion. 2. Negative CT perfusion for acute core infarct. Apparent scattered perfusion abnormality involving both cerebral hemispheres felt to be secondary to motion artifact. 3. Mild atherosclerotic change about the right carotid bifurcation and carotid siphons without hemodynamically significant or correctable stenosis. 4. 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 Critical Value/emergent results were discussed by telephone at the time of interpretation on 09/07/2018 at 11:30 pm to Dr. Verdie Mosher, who verbally acknowledged these results. Electronically Signed   By: Rise Mu M.D.   On: 09/08/2018 00:08   Ct Cervical Spine Wo Contrast  Result Date: 09/07/2018 CLINICAL DATA:  Altered mental status (AMS), unclear cause. Collapsed. EXAM: CT HEAD WITHOUT CONTRAST CT CERVICAL SPINE WITHOUT CONTRAST  TECHNIQUE: Multidetector CT imaging of the head and cervical spine was performed following the standard protocol without intravenous contrast. Multiplanar CT image reconstructions of the cervical spine were also generated. COMPARISON:  None. FINDINGS: CT HEAD FINDINGS Brain: No intracranial hemorrhage, mass effect, or midline shift. Mild generalized atrophy and chronic small vessel ischemia. No hydrocephalus. The basilar cisterns are patent. Remote lacunar infarcts in the basal ganglia and left caudate. Possible small remote infarct in left cerebellum. No evidence of territorial infarct or acute ischemia. No extra-axial or intracranial fluid collection. Vascular: No hyperdense vessel. Skull: No fracture or focal lesion. Sinuses/Orbits: Scattered ethmoid air cell opacification. No sinus fluid levels. Visualized mastoid air cells are clear. Included orbits are unremarkable. Other: None. CT CERVICAL SPINE FINDINGS Mild motion artifact limitation, patient had difficulty tolerating the exam and had to be restrained. Alignment: Normal. Skull base and vertebrae: No displaced fracture, motion limits detailed evaluation at multiple levels, particularly skull base and C1-C2. Soft tissues and spinal canal: No prevertebral fluid or swelling. No visible canal hematoma. Upper esophagus is patulous. Disc levels: Diffuse degenerative disc disease. Multilevel facet arthropathy. Upper chest: No acute findings. Other: Carotid calcifications. IMPRESSION: 1. No acute intracranial abnormality. 2. Mild atrophy and chronic small vessel ischemia. 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 3. Motion artifact limits detailed evaluation of the cervical spine. No gross fracture or subluxation. Multilevel degenerative disc disease and facet arthropathy. Electronically Signed   By: Narda Rutherford M.D.   On: 09/07/2018 21:48   Ct Code Stroke Cta Cerebral Perfusion W/wo Contrast  Result Date: 09/08/2018 EXAM: CT ANGIOGRAPHY HEAD AND NECK  CT PERFUSION BRAIN TECHNIQUE: Multidetector CT imaging of the head and neck was performed using the standard protocol during bolus administration of intravenous contrast. Multiplanar CT image reconstructions and MIPs were obtained to evaluate the vascular anatomy. Carotid stenosis measurements (when applicable) are obtained utilizing NASCET criteria, using the distal internal carotid diameter as the denominator. Multiphase CT imaging of the brain was performed following IV bolus contrast injection. Subsequent parametric perfusion maps were calculated using RAPID software. CONTRAST:  OMNIPAQUE IOHEXOL 350 MG/ML SOLN COMPARISON:  Prior CT from earlier the same day. 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 Left common carotid artery arises first, followed by the right common carotid artery, and then the right subclavian artery. The left subclavian artery arises from the proximal left common carotid artery (left brachiocephalic artery). Mild atheromatous plaque about the proximal subclavian arteries bilaterally without flow-limiting stenosis. No other hemodynamically significant stenosis or other acute finding about the arch or origin of the great vessels. Right carotid system: Right common carotid artery patent from its origin to the bifurcation without stenosis.  Mild eccentric calcified plaque about the right bifurcation/proximal right ICA without hemodynamically significant stenosis. Right ICA patent distally to the skull base without stenosis, dissection or occlusion. Left carotid system: Left common carotid artery patent from its origin to the bifurcation without stenosis. No significant atheromatous narrowing about the left bifurcation. Left ICA patent distally to the skull base without stenosis, dissection, or occlusion. Vertebral arteries: Both of the vertebral arteries arise from the subclavian arteries. Right vertebral artery dominant.  Vertebral arteries patent within the neck without stenosis, dissection, or occlusion. Skeleton: No acute osseous finding. No discrete lytic or blastic osseous lesions. Other neck: No other acute soft tissue abnormality within the neck. Salivary glands within normal limits. Thyroid normal. No adenopathy. Upper chest: Visualized upper chest demonstrates no acute finding. Scattered coronary artery calcifications noted. Review of the MIP images confirms the above findings CTA HEAD FINDINGS Anterior circulation: Evaluation of the intracranial circulation limited by timing of the contrast bolus as well as motion artifact. Petrous segments widely patent. Mild scattered atheromatous plaque within the cavernous/supraclinoid ICAs without hemodynamically significant stenosis. A1 segments patent bilaterally. Normal anterior communicating artery complex. Anterior cerebral arteries grossly patent to their distal aspects without stenosis. No M1 stenosis or occlusion. Normal MCA bifurcations. Distal MCA branches well perfused and symmetric. Posterior circulation: Dominant right vertebral artery patent to the vertebrobasilar junction without stenosis. Right PICA patent proximally. Hypoplastic left vertebral artery divides at the takeoff of the left PICA, with a small branch ascending towards the vertebrobasilar junction. Left PICA patent proximally. Basilar patent to its distal aspect without appreciable stenosis. Superior cerebral arteries patent proximally. Left PCA predominantly supplied via the basilar. Predominant fetal type origin of the right PCA supplied via a robust right posterior communicating artery. PCAs perfused to their distal aspects without appreciable stenosis. Venous sinuses: Patent. Anatomic variants: Fetal type origin of the right PCA. Dominant right vertebral artery. Delayed phase: Not performed. Review of the MIP images confirms the above findings CT Brain Perfusion Findings: ASPECTS: No aspects score  determine. CBF (<30%) Volume: 0mL Perfusion (Tmax>6.0s) volume: 96mL Mismatch Volume: 96mL Infarction Location:Negative CT perfusion for acute core infarct. Apparent scattered ill-defined perfusion abnormality involving both cerebral hemispheres felt to be artifactual nature due to motion artifact on this exam. IMPRESSION: 1. Negative CTA for emergent large vessel occlusion. 2. Negative CT perfusion for acute core infarct. Apparent scattered perfusion abnormality involving both cerebral hemispheres felt to be secondary to motion artifact. 3. Mild atherosclerotic change about the right carotid bifurcation and carotid siphons without hemodynamically significant or correctable stenosis. 4. 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 Critical Value/emergent results were discussed by telephone at the time of interpretation on 09/07/2018 at 11:30 pm to Dr. Verdie Mosher, who verbally acknowledged these results. Electronically Signed   By: Rise Mu M.D.   On: 09/08/2018 00:08   Dg Chest Port 1 View  Result Date: 09/07/2018 CLINICAL DATA:  Unresponsive. EXAM: PORTABLE CHEST 1 VIEW COMPARISON:  None. FINDINGS: The heart size and mediastinal contours are within normal limits. Both lungs are clear. The visualized skeletal structures are unremarkable. IMPRESSION: No active disease. Electronically Signed   By: Katherine Mantle M.D.   On: 09/07/2018 21:59   Desiree Metzger-Cihelka, ARNP-C, ANVP-BC Pager: [phone]    Attending addendum Agree with history and physical documented above I independently examined the patient and reviewed imaging including CT head and CT angiogram head and neck which are unremarkable for acute process or LVO. She has another chart number by the medical  record #[id number] Brief chart review of that medical record number reveals an extensive psychiatric medical history with prior IVCs. She has a history of stroke documented but I was not able to pull-up any  neurological consultations.  Care everywhere also did not reveal any neurological consultations under that medical record number. History of stroke is documented in the oldest family medicine note from 07/03/2007 with no residual deficits.  On examination, agree with the exam above.  Assessment/Plan: This is a [age]yr old lady with complex psych history who lives in a group home. On 7/18 the pt tells me she went out to smoke and fell off the porch step. (This was not witnessed nor reported previously.) She says that is that last she remembers; denies feeling dizzy at that time. She was then found unresponsive by staff and brought to [facility] ER where teleneurology did code stroke wk up, which was unrevealing.   Neurology was consulted to follow up on teleneurology consult and assess ongoing AMS. The pt was just transferred from ICU to floor bed upon my arrival. She easily awakens to voice, answers questions and gives details to her HPI as above. While she does have an odd affect and is sleepy during exam, states she feels very "tired" and asks to be left alone, she is clearly no longer obtunded or unresponsive.  Given the acute anemia, a watershed stroke should be ruled out for which I would recommend getting an MRI-please call if abnormal.  # Unresponsive spell- difficult to tell if pt had syncope/collapse or mechanical fall or if a fall at all? Neurologically she is intact and complete stroke wk up including CTA/CTP showed no acute issues. No seizure like activity reoprted nor suspected- if continues to be encephalopathic over the next couple of days after the resolution of anemia, can consider ordering an EEG then.  # Encephalopathy- Resolved, but pt remains very "tired", could be r/t acute anemia  # Anemia- Hgb 6.8, could this have lead to fall? no hemodynamic instability at this time; she is symptomatically "tired". No report of GI issues. Hem+. She was transfuse 1u PRBC.   RECs: At this time I think it  is reasonable to continue to monitor mentation and lethargy, which could be compounded by anemia.  No focal deficits or other indication for MRI brain at this time other than the fact that she was very anemic- MRI brain to rule out stroke.  Call us if results are abnormal. Rehab evals Psych consult  Neurology will be available as needed.   -- Milon DikesAshish Jacquelynne Guedes, MD [facility] Pager: [phone] If 7pm to 7am, please call on call as listed on AMION.

## 2018-09-08 NOTE — Progress Notes (Signed)
New Admission Note:   Arrival Method: transfer from [place] Orientation: A&Ox2 Telemetry:on Assessment: Completed, refer to chart Skin: Intact IV: left AC Pain: Denies Tubes: None Safety Measures: Safety Fall Prevention Plan has been discussed  Admission: to be completed 5 [place] Orientation: Patient has been orientated to the room, unit and staff.   Family: none  Orders to be reviewed and implemented. Will continue to monitor the patient. Call light has been placed within reach and bed alarm has been activated.

## 2018-09-08 NOTE — H&P (Signed)
NAME:  [name] [name], MRN:  [id number], DOB:  1965-08-14, LOS: 0 ADMISSION DATE:  09/07/2018, CONSULTATION DATE:  09/08/18 , CHIEF COMPLAINT:  Unresponsiveness  Brief History   This is a 53 yo with history of CVA, Depression, HTN, and incontinence presents to outside ED for unresponsiveness.  History of present illness   This is a 53 yo with history of CVA, Depression, HTN, and incontinence presents to outside ED for unresponsiveness. Patient was at group home. Apparently went out so smoke cigarette and went unresponsive. CPR was started by group home. unsure of indication. Patient noted to have peripheral pulses by EMS when they arrived. Patient brought to the ED. Workup pertinent for mildly elevated trop of 23 and lactic acid of 2.8. Ct head no acute intracranil abnormality. No large vessel occlusion on CTA noted. Neuro consulted via telehealth. No specific etiology noted at that time. Patient was transferred to [facility] ICU for further management. There was concern that she would not be able to protect airway. Also of note patient with hgb of 6.9 at Outside ED with positive hem occult. Patient given one unit of blood.   After speaking with son he had not noted any decline earlier in the day. She was talking fine. She was following commands and able to ambulate with no issues. Confirms full code status.  Past Medical History  CVA Depression HTN Incontinence  Significant Hospital Events   Admitted to ICU on 7/19  Consults:  Neuro as above  Procedures:  None  Significant Diagnostic Tests:  None  Micro Data:  UCX from OSH pending. UA reassuring  Antimicrobials:  none  Interim history/subjective:  NA  Objective   Blood pressure (!) 155/96, pulse (!) 102, temperature 98.4 F (36.9 C), resp. rate 16, height 5\' 8"  (1.727 m), weight 79.4 kg, SpO2 96 %.       No intake or output data in the 24 hours ending 09/08/18 0233 Filed Weights   09/07/18 2100  Weight: 79.4 kg     Examination: General: Patient AO x4. Easily arousable HENT: Moist mucous membranes Lungs: CTAB Cardiovascular: RRR Abdomen: Soft non tender non distended Extremities: Moving all spontaneously and to command Neuro: Patient will not follow commands. Will move extremities to pain. Intermittently will do intentional things. She tried to punch (Me). Than she internationally scratched me.  GU: Deferred  Resolved Hospital Problem list   NA  Assessment & Plan:  This is a 53 yo with history as noted above who presents to ICU for continued observation in setting of unresponsiveness. Concern if patient able to protect airway and thus observation in ICU requested.   Encephalopathy-Resolving at this point. Patient was noted to be unresponsive at nursing home and confused at outlying hospital. Request for ICU transfer for observation. Likely from polypharmacy and trazodone. Concern would be cardiac etiology. Trop with minimal elevation. EKG reassuring. Ct head reassuring as well. Of note physical exam not consistent as patient will intermittently respond to questions and make purposeful movement.  -Will attain Echo in AM.   -Trends trop -Hold trazodone consider alternative for sleep   HTN-Note well controlled at this time. BP high -Restart home meds which include norvasc,and lisinopril  Anemia-likely chronic no hemodynamic instability at this time. No reports of blood stools. Did have positive fecal occult -Transfuse for gaol of 7.0 -If stble can pursue further workup outpatient   Pre diabetes/diabetes? -Per med rec on metformin. Unsure of diagnosis -A1c now -ISS    Best practice:  Diet: Cardiac diet  Pain/Anxiety/Delirium protocol (if indicated): NA VAP protocol (if indicated): NA DVT prophylaxis: Lovenox GI prophylaxis: NA Glucose control: NA Mobility: PT and as tolerated  Code Status: Full code per son gregory Family Communication: Patient can perform this Disposition:   Labs    CBC: Recent Labs  Lab 09/07/18 2059 09/07/18 2103  WBC 7.7  --   NEUTROABS 4.1  --   HGB 6.9* 8.5*  HCT 24.7* 25.0*  MCV 69.4*  --   PLT 233  --     Basic Metabolic Panel: Recent Labs  Lab 09/07/18 2059 09/07/18 2103  NA 140 140  K 3.8 3.8  CL 105 105  CO2 23  --   GLUCOSE 217* 214*  BUN 30* 27*  CREATININE 0.94 1.00  CALCIUM 9.3  --    GFR: Estimated Creatinine Clearance: 72.8 mL/min (by C-G formula based on SCr of 1 mg/dL). Recent Labs  Lab 09/07/18 2059 09/07/18 2116 09/07/18 2357  WBC 7.7  --   --   LATICACIDVEN  --  2.8* 1.7    Liver Function Tests: Recent Labs  Lab 09/07/18 2059  AST 23  ALT 18  ALKPHOS 57  BILITOT 0.4  PROT 7.8  ALBUMIN 3.9   No results for input(s): LIPASE, AMYLASE in the last 168 hours. No results for input(s): AMMONIA in the last 168 hours.  ABG    Component Value Date/Time   PHART 7.433 09/07/2018 2307   PCO2ART 38.5 09/07/2018 2307   PO2ART 70.1 (L) 09/07/2018 2307   HCO3 25.7 09/07/2018 2307   TCO2 23 09/07/2018 2103   O2SAT 93.0 09/07/2018 2307     Coagulation Profile: Recent Labs  Lab 09/07/18 2059  INR 1.0    Cardiac Enzymes: No results for input(s): CKTOTAL, CKMB, CKMBINDEX, TROPONINI in the last 168 hours.  HbA1C: No results found for: HGBA1C  CBG: No results for input(s): GLUCAP in the last 168 hours.  Review of Systems:   Pertinent positives and negatives per HPI  Past Medical History  She,  has a past medical history of CVA (cerebral vascular accident) (HCC), Depression, Hypertension, and Incontinence.   Surgical History   History reviewed. No pertinent surgical history.   Social History      Family History   Her family history is not on file.   Allergies No Known Allergies   Home Medications  Prior to Admission medications   Medication Sig Start Date End Date Taking? Authorizing Provider  amLODipine (NORVASC) 5 MG tablet Take 5 mg by mouth every morning.    Yes [provider]  aspirin EC 81 MG tablet Take 81 mg by mouth 2 (two) times a day.    Yes [provider]  atorvastatin (LIPITOR) 40 MG tablet Take 40 mg by mouth every evening.   Yes [provider]  busPIRone (BUSPAR) 10 MG tablet Take 10 mg by mouth 3 (three) times daily.   Yes [provider]  celecoxib (CELEBREX) 100 MG capsule Take 100 mg by mouth 2 (two) times daily.   Yes [provider]  cholecalciferol (VITAMIN D3) 25 MCG (1000 UT) tablet Take 1,000 Units by mouth every morning.   Yes [provider]  Fluticasone-Salmeterol (ADVAIR) 250-50 MCG/DOSE AEPB Inhale 1 puff into the lungs 2 (two) times daily.   Yes [provider]  furosemide (LASIX) 20 MG tablet Take 20 mg by mouth 2 (two) times daily.   Yes [provider]  lisinopril (ZESTRIL) 20 MG  tablet Take 20 mg by mouth every morning.    Yes [provider]  metFORMIN (GLUCOPHAGE-XR) 500 MG 24 hr tablet Take 500 mg by mouth 2 (two) times a day.    Yes [provider]  potassium chloride (K-DUR) 10 MEQ tablet Take 10 mEq by mouth every morning.   Yes [provider]  traZODone (DESYREL) 100 MG tablet Take 100 mg by mouth at bedtime.   Yes [provider]     Critical care time: 45 minutes

## 2018-09-08 NOTE — Progress Notes (Signed)
[facility] Progress Note Patient Name: [name] [name] DOB: August 09, 1965 MRN: [id number]   Date of Service  09/08/2018  HPI/Events of Note  ABG on room air = 7.45/34.9/77.0/24.4  eICU Interventions  Continue present management.      Intervention Category Intermediate Interventions: Diagnostic test evaluation  [name] [name] 09/08/2018, 6:40 AM

## 2018-09-08 NOTE — Consult Note (Signed)
Patient uncooperative with psychiatric evaluation/interview. Spoke with patient staff nurse and provider. Provider agreed to re-consult psych service when patient is alert and able to conduct a psych interview.  Corena Pilgrim, MD Attending psychiatrist

## 2018-09-08 NOTE — Progress Notes (Signed)
Brief note:  Remains sleepy , arouses awakes to noxious stimuli but then drifts back off.  VSS , O2 sats 100% on room air   Plan  Psych consult  Neuro to follow  Transfer to [facility] /Med surg   Chancy Claros NP-C  [facility]  [phone]  09/08/2018

## 2018-09-08 NOTE — ED Provider Notes (Signed)
Assumed care at shift change.  Patient from group home with her unresponsive episode and possible cardiac arrest.  She is now awake and alert after receiving Narcan but remained somnolent.  CT scan pending to evaluate for stroke.  CT perfusion study is negative for core infarct.  CTA is negative for large vessel occlusion.  Admission to [facility] dw Dr Oletta Darter.  Patient protecting airway at time of transfer.    Ezequiel Essex, MD 09/08/18 [phone]

## 2018-09-09 ENCOUNTER — Encounter (HOSPITAL_COMMUNITY): Payer: Self-pay

## 2018-09-09 ENCOUNTER — Inpatient Hospital Stay (HOSPITAL_COMMUNITY): Payer: Medicaid Other

## 2018-09-09 ENCOUNTER — Other Ambulatory Visit: Payer: Self-pay

## 2018-09-09 ENCOUNTER — Inpatient Hospital Stay (HOSPITAL_COMMUNITY)
Admit: 2018-09-09 | Discharge: 2018-09-09 | Disposition: A | Discharge status: Home / Self Care | Payer: Medicaid Other | Attending: Adult Health | Admitting: Adult Health

## 2018-09-09 DIAGNOSIS — R4182 Altered mental status, unspecified: Secondary | ICD-10-CM

## 2018-09-09 DIAGNOSIS — R0600 Dyspnea, unspecified: Secondary | ICD-10-CM

## 2018-09-09 DIAGNOSIS — D72829 Elevated white blood cell count, unspecified: Secondary | ICD-10-CM

## 2018-09-09 DIAGNOSIS — I10 Essential (primary) hypertension: Secondary | ICD-10-CM

## 2018-09-09 LAB — PHOSPHORUS: Phosphorus: 3.8 mg/dL (ref 2.5–4.6)

## 2018-09-09 LAB — URINE CULTURE: Culture: NO GROWTH

## 2018-09-09 LAB — BASIC METABOLIC PANEL
Anion gap: 10 (ref 5–15)
BUN: 15 mg/dL (ref 6–20)
CO2: 24 mmol/L (ref 22–32)
Calcium: 9 mg/dL (ref 8.9–10.3)
Chloride: 105 mmol/L (ref 98–111)
Creatinine, Ser: 0.87 mg/dL (ref 0.44–1.00)
GFR calc Af Amer: >60 mL/min (ref >60)
GFR calc non Af Amer: >60 mL/min (ref >60)
Glucose, Bld: 92 mg/dL (ref 70–99)
Potassium: 3.8 mmol/L (ref 3.5–5.1)
Sodium: 139 mmol/L (ref 135–145)

## 2018-09-09 LAB — BPAM RBC
Blood Product Expiration Date: 202008082359
ISSUE DATE / TIME: 202007190136
Unit Type and Rh: 6200

## 2018-09-09 LAB — CBC
HCT: 30.4 % — ABNORMAL LOW (ref 36.0–46.0)
Hemoglobin: 8.8 g/dL — ABNORMAL LOW (ref 12.0–15.0)
MCH: 20.7 pg — ABNORMAL LOW (ref 26.0–34.0)
MCHC: 28.9 g/dL — ABNORMAL LOW (ref 30.0–36.0)
MCV: 71.4 fL — ABNORMAL LOW (ref 80.0–100.0)
Platelets: 182 10*3/uL (ref 150–400)
RBC: 4.26 MIL/uL (ref 3.87–5.11)
RDW: 19.9 % — ABNORMAL HIGH (ref 11.5–15.5)
WBC: 11.7 10*3/uL — ABNORMAL HIGH (ref 4.0–10.5)
nRBC: 0 % (ref 0.0–0.2)

## 2018-09-09 LAB — TSH: TSH: 3.141 u[IU]/mL (ref 0.350–4.500)

## 2018-09-09 LAB — TYPE AND SCREEN
ABO/RH(D): A POS
Antibody Screen: NEGATIVE
Unit division: 0

## 2018-09-09 LAB — GLUCOSE, CAPILLARY
Glucose-Capillary: 81 mg/dL (ref 70–99)
Glucose-Capillary: 249 mg/dL — ABNORMAL HIGH (ref 70–99)
Glucose-Capillary: 122 mg/dL — ABNORMAL HIGH (ref 70–99)
Glucose-Capillary: 147 mg/dL — ABNORMAL HIGH (ref 70–99)

## 2018-09-09 LAB — ECHOCARDIOGRAM COMPLETE
Height: 68 in
Weight: 2839.52 oz

## 2018-09-09 LAB — MAGNESIUM: Magnesium: 1.6 mg/dL — ABNORMAL LOW (ref 1.7–2.4)

## 2018-09-09 MED ORDER — TRAMADOL HCL 50 MG PO TABS
50.0000 mg | ORAL_TABLET | Freq: Once | ORAL | Status: AC
  Administered 2018-09-09: 50 mg via ORAL
  Filled 2018-09-09: qty 1

## 2018-09-09 MED ORDER — ACETAMINOPHEN 325 MG PO TABS
650.0000 mg | ORAL_TABLET | Freq: Four times a day (QID) | ORAL | Status: DC | PRN
  Administered 2018-09-09 – 2018-09-13 (×5): 650 mg via ORAL
  Filled 2018-09-09 (×6): qty 2

## 2018-09-09 MED ORDER — PANTOPRAZOLE SODIUM 40 MG PO TBEC
40.0000 mg | DELAYED_RELEASE_TABLET | Freq: Every day | ORAL | Status: DC
  Administered 2018-09-10 – 2018-09-13 (×4): 40 mg via ORAL
  Filled 2018-09-09 (×4): qty 1

## 2018-09-09 MED ORDER — MAGNESIUM SULFATE 2 GM/50ML IV SOLN
2.0000 g | Freq: Once | INTRAVENOUS | Status: AC
  Administered 2018-09-09: 2 g via INTRAVENOUS
  Filled 2018-09-09: qty 50

## 2018-09-09 NOTE — Progress Notes (Signed)
Attempted to call patients son for an update. Will try again later.

## 2018-09-09 NOTE — Consult Note (Signed)
Telepsych Consultation   Reason for Consult:  "Altered mental status/psychiatric illness history" Referring Physician:  Dr. Glade LloydKshitiz Alekh Location of Patient:  [facility] Location of Provider: [facility]  Patient Identification: [name] PerchesJennifer [name] MRN:  [id number] Principal Diagnosis: Altered mental status Diagnosis:  Principal Problem:   Altered mental status Active Problems:   Loss of consciousness (HCC)   Encephalopathy   Total Time spent with patient: 1 hour  Subjective:   [name] PerchesJennifer [name] is a 53 y.o. female patient admitted with altered mental status.  HPI:   Per chart review, patient was admitted with acute encephalopathy of unclear etiology. She was found unresponsive at her group home. CPR was initiated. She was found to have peripheral pulses when EMS arrived. She was given 1 unit of blood after she was found to have a hemoglobin of 6.9 with positive hemoccult. Home medications include Buspar 10 mg BID and Trazodone 100 mg qhs.   On interview,  [name] [name] reports that she "passed out" and was brought to the hospital by EMS. She reports that her legs hurt. She is oriented to person and place. She believes the month is January and year is "[phone]." She reports that her mood is stable. She denies a history of depression or anxiety. She is unable to name her home medications. She denies SI, HI or AVH. She denies additional somatic complaints.   Past Psychiatric History: Depression   Risk to Self:  None. Denies SI.  Risk to Others:  None. Denies HI.  Prior Inpatient Therapy:  Denies  Prior Outpatient Therapy:  Denies   Past Medical History:  Past Medical History:  Diagnosis Date  . CVA (cerebral vascular accident) (HCC)   . Depression   . Hypertension   . Incontinence    History reviewed. No pertinent surgical history. Family History: No family history on file. Family Psychiatric  History: Denies  Social History:  Social History   Substance and Sexual  Activity  Alcohol Use None     Social History   Substance and Sexual Activity  Drug Use Not on file    Social History   Socioeconomic History  . Marital status: Single    Spouse name: Not on file  . Number of children: Not on file  . Years of education: Not on file  . Highest education level: Not on file  Occupational History  . Not on file  Social Needs  . Financial resource strain: Not on file  . Food insecurity    Worry: Not on file    Inability: Not on file  . Transportation needs    Medical: Not on file    Non-medical: Not on file  Tobacco Use  . Smoking status: Not on file  Substance and Sexual Activity  . Alcohol use: Not on file  . Drug use: Not on file  . Sexual activity: Not on file  Lifestyle  . Physical activity    Days per week: Not on file    Minutes per session: Not on file  . Stress: Not on file  Relationships  . Social Musicianconnections    Talks on phone: Not on file    Gets together: Not on file    Attends religious service: Not on file    Active member of club or organization: Not on file    Attends meetings of clubs or organizations: Not on file    Relationship status: Not on file  Other Topics Concern  . Not on file  Social History  Narrative  . Not on file   Additional Social History: She lives alone. She has 2 adult sons (3818 and 53 y/o) and a 53 y/o son. She denies illicit substance or alcohol use.     Allergies:  No Known Allergies  Labs:  Results for orders placed or performed during the hospital encounter of 09/07/18 (from the past 48 hour(s))  Acetaminophen level     Status: Abnormal   Collection Time: 09/07/18  8:59 PM  Result Value Ref Range   Acetaminophen (Tylenol), Serum <10 (L) 10 - 30 ug/mL    Comment: (NOTE) Therapeutic concentrations vary significantly. A range of 10-30 ug/mL  may be an effective concentration for many patients. However, some  are best treated at concentrations outside of this range. Acetaminophen  concentrations >150 ug/mL at 4 hours after ingestion  and >50 ug/mL at 12 hours after ingestion are often associated with  toxic reactions. Performed at [facility], [address] [place], [place] [address]   Comprehensive metabolic panel     Status: Abnormal   Collection Time: 09/07/18  8:59 PM  Result Value Ref Range   Sodium 140 135 - 145 mmol/L   Potassium 3.8 3.5 - 5.1 mmol/L   Chloride 105 98 - 111 mmol/L   CO2 23 22 - 32 mmol/L   Glucose, Bld 217 (H) 70 - 99 mg/dL   BUN 30 (H) 6 - 20 mg/dL   Creatinine, Ser 6.040.94 0.44 - 1.00 mg/dL   Calcium 9.3 8.9 - 54.010.3 mg/dL   Total Protein 7.8 6.5 - 8.1 g/dL   Albumin 3.9 3.5 - 5.0 g/dL   AST 23 15 - 41 U/L   ALT 18 0 - 44 U/L   Alkaline Phosphatase 57 38 - 126 U/L   Total Bilirubin 0.4 0.3 - 1.2 mg/dL   GFR calc non Af Amer >60 >60 mL/min   GFR calc Af Amer >60 >60 mL/min   Anion gap 12 5 - 15    Comment: Performed at [facility], [address] [place], [place] [address]  Ethanol     Status: None   Collection Time: 09/07/18  8:59 PM  Result Value Ref Range   Alcohol, Ethyl (B) <10 <10 mg/dL    Comment: (NOTE) Lowest detectable limit for serum alcohol is 10 mg/dL. For medical purposes only. Performed at [facility], [address] [place], [place] [address]   Salicylate level     Status: None   Collection Time: 09/07/18  8:59 PM  Result Value Ref Range   Salicylate Lvl <7.0 2.8 - 30.0 mg/dL    Comment: Performed at [facility], [address] [place], [place] [address]  Troponin I (High Sensitivity)     Status: None   Collection Time: 09/07/18  8:59 PM  Result Value Ref Range   Troponin I (High Sensitivity) <2.0 <18 ng/L    Comment: Performed at [facility], [address] [place], [place] [address]  CBC with Differential     Status: Abnormal   Collection Time: 09/07/18  8:59 PM  Result Value Ref Range   WBC 7.7 4.0 - 10.5 K/uL   RBC 3.56 (L) 3.87 - 5.11 MIL/uL   Hemoglobin 6.9 (LL) 12.0 - 15.0 g/dL    Comment:  REPEATED TO VERIFY THIS CRITICAL RESULT HAS VERIFIED AND BEEN CALLED TO [place] BY JAMIE WOODIE ON 07 18 2020 AT 2119, AND HAS BEEN READ BACK.     HCT 24.7 (L) 36.0 - 46.0 %  MCV 69.4 (L) 80.0 - 100.0 fL   MCH 19.4 (L) 26.0 - 34.0 pg   MCHC 27.9 (L) 30.0 - 36.0 g/dL   RDW 36.6 (H) 44.0 - 34.7 %   Platelets 233 150 - 400 K/uL   nRBC 0.0 0.0 - 0.2 %   Neutrophils Relative % 54 %   Neutro Abs 4.1 1.7 - 7.7 K/uL   Lymphocytes Relative 34 %   Lymphs Abs 2.6 0.7 - 4.0 K/uL   Monocytes Relative 8 %   Monocytes Absolute 0.6 0.1 - 1.0 K/uL   Eosinophils Relative 4 %   Eosinophils Absolute 0.3 0.0 - 0.5 K/uL   Basophils Relative 0 %   Basophils Absolute 0.0 0.0 - 0.1 K/uL   Immature Granulocytes 0 %   Abs Immature Granulocytes 0.02 0.00 - 0.07 K/uL    Comment: Performed at [facility], [address], [place], [place] [address]  Protime-INR     Status: None   Collection Time: 09/07/18  8:59 PM  Result Value Ref Range   Prothrombin Time 13.3 11.4 - 15.2 seconds   INR 1.0 0.8 - 1.2    Comment: (NOTE) INR goal varies based on device and disease states. Performed at [facility], [address], [place], [place] [address]   Urine rapid drug screen (hosp performed)     Status: None   Collection Time: 09/07/18  8:59 PM  Result Value Ref Range   Opiates NONE DETECTED NONE DETECTED   Cocaine NONE DETECTED NONE DETECTED   Benzodiazepines NONE DETECTED NONE DETECTED   Amphetamines NONE DETECTED NONE DETECTED   Tetrahydrocannabinol NONE DETECTED NONE DETECTED   Barbiturates NONE DETECTED NONE DETECTED    Comment: (NOTE) DRUG SCREEN FOR MEDICAL PURPOSES ONLY.  IF CONFIRMATION IS NEEDED FOR ANY PURPOSE, NOTIFY LAB WITHIN 5 DAYS. LOWEST DETECTABLE LIMITS FOR URINE DRUG SCREEN Drug Class                     Cutoff (ng/mL) Amphetamine and metabolites    1000 Barbiturate and metabolites    200 Benzodiazepine                 200 Tricyclics and metabolites     300 Opiates and metabolites         300 Cocaine and metabolites        300 THC                            50 Performed at [facility], [address], [place], [place] [address]   Urinalysis, Routine w reflex microscopic     Status: None   Collection Time: 09/07/18  8:59 PM  Result Value Ref Range   Color, Urine YELLOW YELLOW   APPearance CLEAR CLEAR   Specific Gravity, Urine 1.015 1.005 - 1.030   pH 5.0 5.0 - 8.0   Glucose, UA NEGATIVE NEGATIVE mg/dL   Hgb urine dipstick NEGATIVE NEGATIVE   Bilirubin Urine NEGATIVE NEGATIVE   Ketones, ur NEGATIVE NEGATIVE mg/dL   Protein, ur NEGATIVE NEGATIVE mg/dL   Nitrite NEGATIVE NEGATIVE   Leukocytes,Ua NEGATIVE NEGATIVE    Comment: Performed at [facility], [address], [place], [place] [address]  Urine culture     Status: None   Collection Time: 09/07/18  8:59 PM   Specimen: Urine, Random  Result Value Ref Range   Specimen Description      URINE, RANDOM Performed at [facility], [address] [place], [place] [address]    Special Requests      NONE Performed at [facility], [address] [place], [place] [address]    Culture      NO GROWTH Performed at [facility], [address] [place], [place] [address]    Report Status 09/09/2018 FINAL   I-stat chem 8, ED (not at [facility] or [facility])     Status: Abnormal   Collection Time: 09/07/18  9:03 PM  Result Value Ref Range   Sodium 140 135 - 145 mmol/L   Potassium 3.8 3.5 - 5.1 mmol/L   Chloride 105 98 - 111 mmol/L   BUN 27 (H) 6 - 20 mg/dL   Creatinine, Ser 9.141.00 0.44 - 1.00 mg/dL   Glucose, Bld 782214 (H) 70 - 99 mg/dL   Calcium, Ion 9.561.16 2.131.15 - 1.40 mmol/L   TCO2 23 22 - 32 mmol/L   Hemoglobin 8.5 (L) 12.0 - 15.0 g/dL   HCT 08.625.0 (L) 57.836.0 - 46.946.0 %  SARS Coronavirus 2 (CEPHEID - Performed in [facility] hospital lab), Hosp Order     Status: None   Collection Time: 09/07/18  9:15 PM   Specimen: Nasopharyngeal Swab  Result Value Ref Range   SARS Coronavirus 2 NEGATIVE NEGATIVE    Comment: (NOTE) If  result is NEGATIVE SARS-CoV-2 target nucleic acids are NOT DETECTED. The SARS-CoV-2 RNA is generally detectable in upper and lower  respiratory specimens during the acute phase of infection. The lowest  concentration of SARS-CoV-2 viral copies this assay can detect is 250  copies / mL. A negative result does not preclude SARS-CoV-2 infection  and should not be used as the sole basis for treatment or other  patient management decisions.  A negative result may occur with  improper specimen collection / handling, submission of specimen other  than nasopharyngeal swab, presence of viral mutation(s) within the  areas targeted by this assay, and inadequate number of viral copies  (<250 copies / mL). A negative result must be combined with clinical  observations, patient history, and epidemiological information. If result is POSITIVE SARS-CoV-2 target nucleic acids are DETECTED. The SARS-CoV-2 RNA is generally detectable in upper and lower  respiratory specimens dur ing the acute phase of infection.  Positive  results are indicative of active infection with SARS-CoV-2.  Clinical  correlation with patient history and other diagnostic information is  necessary to determine patient infection status.  Positive results do  not rule out bacterial infection or co-infection with other viruses. If result is PRESUMPTIVE POSTIVE SARS-CoV-2 nucleic acids MAY BE PRESENT.   A presumptive positive result was obtained on the submitted specimen  and confirmed on repeat testing.  While 2019 novel coronavirus  (SARS-CoV-2) nucleic acids may be present in the submitted sample  additional confirmatory testing may be necessary for epidemiological  and / or clinical management purposes  to differentiate between  SARS-CoV-2 and other Sarbecovirus currently known to infect humans.  If clinically indicated additional testing with an alternate test  methodology [phone]([id number]) is advised. The SARS-CoV-2 RNA is generally   detectable in upper and lower respiratory sp ecimens during the acute  phase of infection. The expected result is Negative. Fact Sheet for Patients:  BoilerBrush.com.cyhttps://www.fda.gov/media/136312/download Fact Sheet for Healthcare Providers: https://pope.com/https://www.fda.gov/media/136313/download This test is not yet approved or cleared by the [place] FDA and has been authorized for detection and/or diagnosis of SARS-CoV-2 by FDA under an Emergency Use Authorization (EUA).  This EUA will  remain in effect (meaning this test can be used) for the duration of the COVID-19 declaration under Section 564(b)(1) of the Act, 21 U.S.C. section 360bbb-3(b)(1), unless the authorization is terminated or revoked sooner. Performed at [facility], [address] [place], [place] [address]   Lactic acid, plasma     Status: Abnormal   Collection Time: 09/07/18  9:16 PM  Result Value Ref Range   Lactic Acid, Venous 2.8 (HH) 0.5 - 1.9 mmol/L    Comment: CRITICAL RESULT CALLED TO, READ BACK BY AND VERIFIED WITH: POINDEXTER,M @ 2147 ON 09/07/18 BY JUW Performed at [facility], [address] [place], [place] [address]   Type and screen     Status: None   Collection Time: 09/07/18  9:33 PM  Result Value Ref Range   ABO/RH(D) A POS    Antibody Screen NEG    Sample Expiration 09/10/2018,2359    Unit Number [id number]    Blood Component Type RED CELLS,LR    Unit division 00    Status of Unit ISSUED,FINAL    Transfusion Status OK TO TRANSFUSE    Crossmatch Result      Compatible Performed at [facility], [address], [place], [place] [address]   Prepare RBC     Status: None   Collection Time: 09/07/18  9:33 PM  Result Value Ref Range   Order Confirmation      ORDER PROCESSED BY BLOOD BANK Performed at [facility], [address] [place], [place] [address]   ABO/Rh     Status: None   Collection Time: 09/07/18  9:33 PM  Result Value Ref Range   ABO/RH(D)      A POS Performed at [facility], [address], [place], [place] [address]   POC occult blood, ED     Status: Abnormal   Collection Time: 09/07/18 10:09 PM  Result Value Ref Range   Fecal Occult Bld POSITIVE (A) NEGATIVE  Blood gas, arterial     Status: Abnormal   Collection Time: 09/07/18 11:07 PM  Result Value Ref Range   FIO2 21.00    Delivery systems ROOM AIR    pH, Arterial 7.433 7.350 - 7.450   pCO2 arterial 38.5 32.0 - 48.0 mmHg   pO2, Arterial 70.1 (L) 83.0 - 108.0 mmHg   Bicarbonate 25.7 20.0 - 28.0 mmol/L   Acid-Base Excess 1.4 0.0 - 2.0 mmol/L   O2 Saturation 93.0 %   Patient temperature 36.7    Allens test (pass/fail) PASS PASS    Comment: Performed at [facility], [address] [place], [place] [address]  Lactic acid, plasma     Status: None   Collection Time: 09/07/18 11:57 PM  Result Value Ref Range   Lactic Acid, Venous 1.7 0.5 - 1.9 mmol/L    Comment: Performed at [facility], [address], [place], [place] [address]  Troponin I (High Sensitivity)     Status: Abnormal   Collection Time: 09/07/18 11:57 PM  Result Value Ref Range   Troponin I (High Sensitivity) 23.00 (H) <18 ng/L    Comment: (NOTE) Elevated high sensitivity troponin I (hsTnI) values and significant  changes across serial measurements may suggest ACS but many other  chronic and acute conditions are known to elevate hsTnI results.  Refer to the Links section for chest pain algorithms and additional  guidance. Performed at [facility], [address], [place], [place] [address]   Glucose, capillary     Status: Abnormal   Collection  Time: 09/08/18  3:51 AM  Result Value Ref Range   Glucose-Capillary 199 (H) 70 - 99 mg/dL  MRSA PCR Screening     Status: None   Collection Time: 09/08/18  4:05 AM   Specimen: Nasal Mucosa; Nasopharyngeal  Result Value Ref Range   MRSA by PCR NEGATIVE NEGATIVE    Comment:        The GeneXpert MRSA Assay (FDA approved for NASAL specimens only), is one component of a comprehensive MRSA  colonization surveillance program. It is not intended to diagnose MRSA infection nor to guide or monitor treatment for MRSA infections. Performed at [facility], [address], [place], [place] [address]   HIV antibody (Routine Testing)     Status: None   Collection Time: 09/08/18  5:32 AM  Result Value Ref Range   HIV Screen 4th Generation wRfx Non Reactive Non Reactive    Comment: (NOTE) Performed At: [facility] [address] [place], [place] [address] Jolene Schimke MD XB:[id number]   Basic metabolic panel     Status: Abnormal   Collection Time: 09/08/18  5:32 AM  Result Value Ref Range   Sodium 138 135 - 145 mmol/L   Potassium 3.6 3.5 - 5.1 mmol/L   Chloride 103 98 - 111 mmol/L   CO2 24 22 - 32 mmol/L   Glucose, Bld 175 (H) 70 - 99 mg/dL   BUN 19 6 - 20 mg/dL   Creatinine, Ser 3.08 0.44 - 1.00 mg/dL   Calcium 9.1 8.9 - 65.7 mg/dL   GFR calc non Af Amer >60 >60 mL/min   GFR calc Af Amer >60 >60 mL/min   Anion gap 11 5 - 15    Comment: Performed at [facility], [address], [place], [place] [address]  Magnesium     Status: Abnormal   Collection Time: 09/08/18  5:32 AM  Result Value Ref Range   Magnesium 1.4 (L) 1.7 - 2.4 mg/dL    Comment: Performed at [facility], [address], [place], [place] [address]  Phosphorus     Status: None   Collection Time: 09/08/18  5:32 AM  Result Value Ref Range   Phosphorus 3.2 2.5 - 4.6 mg/dL    Comment: Performed at [facility], [address], [place], [place] [address]  Ammonia     Status: None   Collection Time: 09/08/18  5:32 AM  Result Value Ref Range   Ammonia 16 9 - 35 umol/L    Comment: Performed at [facility], [address] [place], [place] [address]  Type and screen [facility]     Status: None   Collection Time: 09/08/18  5:32 AM  Result Value Ref Range   ABO/RH(D) A POS    Antibody Screen NEG    Sample Expiration      09/11/2018,2359 Performed  at [facility], [address], [place], [place] [address]   Troponin I (High Sensitivity)     Status: None   Collection Time: 09/08/18  5:32 AM  Result Value Ref Range   Troponin I (High Sensitivity) 3 <18 ng/L    Comment: (NOTE) Elevated high sensitivity troponin I (hsTnI) values and significant  changes across serial measurements may suggest ACS but many other  chronic and acute conditions are known to elevate hsTnI results.  Refer to the "Links" section for chest pain algorithms and additional  guidance. Performed at [facility],  [address] [place], [place] [address]   Hemoglobin A1c     Status: Abnormal   Collection Time: 09/08/18  5:32 AM  Result Value Ref Range   Hgb A1c MFr Bld 5.8 (H) 4.8 - 5.6 %    Comment: (NOTE) Pre diabetes:          5.7%-6.4% Diabetes:              >6.4% Glycemic control for   <7.0% adults with diabetes    Mean Plasma Glucose 119.76 mg/dL    Comment: Performed at [facility], [address], [place], [place] [address]  Glucose, capillary     Status: Abnormal   Collection Time: 09/08/18  5:32 AM  Result Value Ref Range   Glucose-Capillary 152 (H) 70 - 99 mg/dL  ABO/Rh     Status: None   Collection Time: 09/08/18  5:32 AM  Result Value Ref Range   ABO/RH(D)      A POS Performed at [facility], [address], [place], [place] [address]   I-STAT 7, (LYTES, BLD GAS, ICA, H+H)     Status: Abnormal   Collection Time: 09/08/18  6:12 AM  Result Value Ref Range   pH, Arterial 7.456 (H) 7.350 - 7.450   pCO2 arterial 34.9 32.0 - 48.0 mmHg   pO2, Arterial 77.0 (L) 83.0 - 108.0 mmHg   Bicarbonate 24.4 20.0 - 28.0 mmol/L   TCO2 25 22 - 32 mmol/L   O2 Saturation 96.0 %   Acid-Base Excess 1.0 0.0 - 2.0 mmol/L   Sodium 141 135 - 145 mmol/L   Potassium 3.7 3.5 - 5.1 mmol/L   Calcium, Ion 1.24 1.15 - 1.40 mmol/L   HCT 27.0 (L) 36.0 - 46.0 %   Hemoglobin 9.2 (L) 12.0 - 15.0 g/dL   Patient temperature 91.4 C     Collection site RADIAL, ALLEN'S TEST ACCEPTABLE    Drawn by Operator    Sample type ARTERIAL   Troponin I (High Sensitivity)     Status: None   Collection Time: 09/08/18  6:14 AM  Result Value Ref Range   Troponin I (High Sensitivity) 3 <18 ng/L    Comment: (NOTE) Elevated high sensitivity troponin I (hsTnI) values and significant  changes across serial measurements may suggest ACS but many other  chronic and acute conditions are known to elevate hsTnI results.  Refer to the "Links" section for chest pain algorithms and additional  guidance. Performed at [facility], [address] [place], [place] [address]   Glucose, capillary     Status: None   Collection Time: 09/08/18  8:09 AM  Result Value Ref Range   Glucose-Capillary 95 70 - 99 mg/dL  Glucose, capillary     Status: Abnormal   Collection Time: 09/08/18 12:02 PM  Result Value Ref Range   Glucose-Capillary 109 (H) 70 - 99 mg/dL  Glucose, capillary     Status: None   Collection Time: 09/08/18  4:09 PM  Result Value Ref Range   Glucose-Capillary 93 70 - 99 mg/dL  Glucose, capillary     Status: None   Collection Time: 09/08/18  9:17 PM  Result Value Ref Range   Glucose-Capillary 98 70 - 99 mg/dL  CBC     Status: Abnormal   Collection Time: 09/09/18  6:25 AM  Result Value Ref Range   WBC 11.7 (H) 4.0 - 10.5 K/uL   RBC 4.26 3.87 - 5.11 MIL/uL   Hemoglobin  8.8 (L) 12.0 - 15.0 g/dL    Comment: Reticulocyte Hemoglobin testing may be clinically indicated, consider ordering this additional test [id number]    HCT 30.4 (L) 36.0 - 46.0 %   MCV 71.4 (L) 80.0 - 100.0 fL   MCH 20.7 (L) 26.0 - 34.0 pg   MCHC 28.9 (L) 30.0 - 36.0 g/dL   RDW 19.9 (H) 11.5 - 15.5 %   Platelets 182 150 - 400 K/uL   nRBC 0.0 0.0 - 0.2 %    Comment: Performed at [facility], [place] [address], [place], [place] [address]  Basic metabolic panel     Status: None   Collection Time: 09/09/18  6:25 AM  Result Value Ref Range   Sodium 139 135 -  145 mmol/L   Potassium 3.8 3.5 - 5.1 mmol/L   Chloride 105 98 - 111 mmol/L   CO2 24 22 - 32 mmol/L   Glucose, Bld 92 70 - 99 mg/dL   BUN 15 6 - 20 mg/dL   Creatinine, Ser 0.87 0.44 - 1.00 mg/dL   Calcium 9.0 8.9 - 10.3 mg/dL   GFR calc non Af Amer >60 >60 mL/min   GFR calc Af Amer >60 >60 mL/min   Anion gap 10 5 - 15    Comment: Performed at [facility], [place] [address], [place], [place] [address]  Magnesium     Status: Abnormal   Collection Time: 09/09/18  6:25 AM  Result Value Ref Range   Magnesium 1.6 (L) 1.7 - 2.4 mg/dL    Comment: Performed at [place] [address] [place], [place] [address]  Phosphorus     Status: None   Collection Time: 09/09/18  6:25 AM  Result Value Ref Range   Phosphorus 3.8 2.5 - 4.6 mg/dL    Comment: Performed at [place] [address], [place], [place] [address]  TSH     Status: None   Collection Time: 09/09/18  6:25 AM  Result Value Ref Range   TSH 3.141 0.350 - 4.500 uIU/mL    Comment: Performed by a 3rd Generation assay with a functional sensitivity of <=0.01 uIU/mL. Performed at [facility], [place] [address], [place], [place] [address]   Glucose, capillary     Status: None   Collection Time: 09/09/18  7:05 AM  Result Value Ref Range   Glucose-Capillary 81 70 - 99 mg/dL  Glucose, capillary     Status: Abnormal   Collection Time: 09/09/18 11:23 AM  Result Value Ref Range   Glucose-Capillary 249 (H) 70 - 99 mg/dL    Medications:  Current Facility-Administered Medications  Medication Dose Route Frequency Provider Last Rate Last Dose  . amLODipine (NORVASC) tablet 5 mg  5 mg Oral q morning - 10a [place], Tammy S, NP   5 mg at 09/09/18 0834  . atorvastatin (LIPITOR) tablet 40 mg  40 mg Oral QPM Parrett, Tammy S, NP   40 mg at 09/08/18 2200  . Chlorhexidine Gluconate Cloth 2 % PADS 6 each  6 each Topical Q0600 Parrett, Tammy S, NP   6 each at 09/09/18 0636  . insulin aspart (novoLOG) injection 0-15 Units   0-15 Units Subcutaneous TID WC Parrett, Tammy S, NP   5 Units at 09/09/18 1129  . lactated ringers infusion   Intravenous Continuous Aline August, MD 50 mL/hr at 09/09/18 0948    . lisinopril (ZESTRIL) tablet 20 mg  20 mg Oral q morning -  10a Parrett, Virgel Bouquet, NP   20 mg at 09/09/18 1610    Musculoskeletal: Strength & Muscle Tone: No atrophy noted. Gait & Station: UTA since patient is lying in bed. Patient leans: N/A  Psychiatric Specialty Exam: Physical Exam  Nursing note and vitals reviewed. Constitutional: She appears well-developed and well-nourished.  HENT:  Head: Normocephalic and atraumatic.  Neck: Normal range of motion.  Respiratory: Effort normal.  Musculoskeletal: Normal range of motion.  Neurological: She is alert.  Oriented to person and place.   Psychiatric: She has a normal mood and affect. Her speech is normal and behavior is normal. Judgment and thought content normal. Cognition and memory are impaired.    Review of Systems  Gastrointestinal: Negative for constipation, diarrhea, nausea and vomiting.  Psychiatric/Behavioral: Negative for depression, hallucinations, substance abuse and suicidal ideas.  All other systems reviewed and are negative.   Blood pressure 131/87, pulse 71, temperature 97.6 F (36.4 C), temperature source Oral, resp. rate 18, height  (1.727 m), weight 80.5 kg, SpO2 99 %.Body mass index is 26.98 kg/m.  General Appearance: Fairly Groomed, middle aged, African American female, wearing a hospital gown with unbrushed hair who is lying in bed. NAD.   Eye Contact:  Good  Speech:  Clear and Coherent and Normal Rate  Volume:  Normal  Mood:  Euthymic  Affect:  Constricted  Thought Process:  Goal Directed, Linear and Descriptions of Associations: Intact  Orientation:  Other:  Oriented to person and place.  Thought Content:  Logical  Suicidal Thoughts:  No  Homicidal Thoughts:  No  Memory:  Immediate;   Poor Recent;   Fair Remote;   Fair   Judgement:  Fair  Insight:  Fair  Psychomotor Activity:  Normal  Concentration:  Concentration: Good and Attention Span: Good  Recall:  [employer] of Knowledge:  Fair  Language:  Good  Akathisia:  No  Handed:  Right  AIMS (if indicated):   N/A  Assets:  Communication Skills Housing Resilience Social Support  ADL's:  Intact  Cognition: Impaired with short term memory deficits.   Sleep:   N/A   Assessment:  [name] [name] is a 53 y.o. female who was admitted with acute encephalopathy of unclear etiology. Patient is oriented to person and place. She appropriately answers questions and is organized in thought process. She denies SI, HI or AVH. She does not appear to be responding to internal stimuli. It is unclear of the etiology of her altered mental status but she appears to be improving.   Treatment Plan Summary: -Review Medications to avoid polypharmacy and avoid deliriogenic medications when possible. -Continue psychotropic medications as prescribed.  -EKG reviewed and QTc 447 on 7/18. Please closely monitor when starting or increasing QTc prolonging agents.  -Psychiatry will sign off on patient at this time. Please consult psychiatry again as needed.    Disposition: No evidence of imminent risk to self or others at present.   Patient does not meet criteria for psychiatric inpatient admission.  This service was provided via telemedicine using a 2-way, interactive audio and video technology.  Names of all persons participating in this telemedicine service and their role in this encounter. Name: Juanetta Beets, DO Role: Psychiatrist   Name: [name] [name] Role: Patient    Cherly Beach, DO 09/09/2018 1:23 PM

## 2018-09-09 NOTE — Progress Notes (Signed)
  Echocardiogram 2D Echocardiogram has been performed.  [name] [name] L Androw 09/09/2018, 10:21 AM

## 2018-09-09 NOTE — Progress Notes (Signed)
Attempted MRI, patient was extrememly confused and kept yelling out during exam. She was also kicking her legs in the air constantly and waving arms inside scanner.  Obtained patial exam with motion, spoke with patient numerous times to try to calm her down. She would respond as if she heard me but never stopped the moving.  Exam ended early when patient grabbed the head coil and began to pull at it

## 2018-09-09 NOTE — Progress Notes (Signed)
Patient is complaining of cramping pain in her BLE. Currently no PRN orders. MD notified.

## 2018-09-09 NOTE — Progress Notes (Addendum)
Patient ID: [name] PerchesJennifer [name], female   DOB: 06-Sep-1965, 53 y.o.   MRN: [id number]  PROGRESS NOTE    [name] PerchesJennifer [name]  NWG:[id number] DOB: 06-Sep-1965 DOA: 09/07/2018 PCP: Patient, No Pcp Per   Brief Narrative:  53 year old female with history of CVA, depression, hypertension and incontinence presented with unresponsiveness and was initially admitted to ICU.  Apparently patient was found unresponsive and CPR was started by group home.  When EMS arrived, she had peripheral pulses.  CT of the head was negative for acute abnormality.  No large vessel occlusion on CTA noted.  Neurology consulted.  She was found to have hemoglobin of 6.9 at outside ED with positive Hemoccult and was given 1 unit of blood.  [facility]  Psychiatry was also consulted.  Assessment & Plan:   Acute encephalopathy: Metabolic versus toxic -Unsure etiology.  Patient was found unresponsive in the group home and initially CPR was started.  When EMS arrived, she had peripheral pulses  -CT of the head was negative for acute abnormality.  No large vessel occlusion on CTA of the brain. -Neurology was consulted.  MRI of the brain was a very poor quality because the patient moving a lot but showed no evidence of acute intracranial abnormality.  EEG pending.  Neurology available as needed. -Psychiatry was also consulted and when psychiatric try to evaluate the patient on 09/08/2018, she was apparently very confused. -This morning, she is more awake and answers some questions.  Will ask psychiatry to reevaluate the patient. -Fall precautions.  Continue neurochecks. -PT eval -SLP eval  Anemia: Unsure if it is acute or chronic -Presented with hemoglobin of 6.9.  No prior hemoglobin in the system.  Was transfused 1 unit of packed red cells on 09/08/2018.  Hemoglobin 8.8 this morning.  FOBT was positive.  No overt melena or hematochezia.  Currently on aspirin 81 mg twice a day.  Will DC  that. -We will start Protonix 40 mg daily.  Might need GI evaluation probably as an outpatient.  Hypertension -Monitor blood pressure.  Continue amlodipine and lisinopril  Prediabetes/question of diabetes  -Blood sugars stable.  Hemoglobin A1c 5.8.  Continue CBG with SSI.  Outpatient follow-up.  Patient apparently was on metformin per med rec.  Hypomagnesemia -Replace.  Repeat a.m. labs  Leukocytosis -Reactive.  Monitor.  Generalized deconditioning -PT eval   DVT prophylaxis: SCDs Code Status: Full Family Communication: Spoke to patient at bedside. Disposition Plan: Depends on clinical outcome.  Consultants: PCCM/neurology/psychiatry  Procedures: None  Antimicrobials: None   Subjective: Patient seen and examined at bedside.  She is sleepy, wakes up slightly on calling her name, answer some questions very slowly.  Very slow to respond to questions.  No overnight fever or vomiting reported.  Objective: Vitals:   09/08/18 1642 09/08/18 2038 09/09/18 0508 09/09/18 0855  BP: (!) 146/81 (!) 157/81 (!) 130/104 131/87  Pulse: 83 90 85 71  Resp: 18 18 16 18   Temp: 98.5 F (36.9 C) 98.2 F (36.8 C) 97.9 F (36.6 C) 97.6 F (36.4 C)  TempSrc: Oral Oral  Oral  SpO2: 95% 99% 98% 99%  Weight:      Height:        Intake/Output Summary (Last 24 hours) at 09/09/2018 1049 Last data filed at 09/09/2018 0900 Gross per 24 hour  Intake 1083.8 ml  Output 1200 ml  Net -116.2 ml   Filed Weights   09/07/18 2100 09/08/18 0400  Weight: 79.4 kg 80.5  kg    Examination:  General exam: Appears calm and comfortable.  Looks older than stated age.  No distress.  Very slow to respond. Respiratory system: Bilateral decreased breath sounds at bases Cardiovascular system: S1 & S2 heard, Rate controlled Gastrointestinal system: Abdomen is nondistended, soft and nontender. Normal bowel sounds heard. Extremities: No cyanosis, clubbing, edema  Central nervous system: Sleepy, wakes up on  calling her name, answers some questions but slow to respond.  No focal neurological deficits. Moving extremities Skin: No rashes, lesions or ulcers Psychiatry: Could not be assessed properly because of current mental status.    Data Reviewed: I have personally reviewed following labs and imaging studies  CBC: Recent Labs  Lab 09/07/18 2059 09/07/18 2103 09/08/18 0612 09/09/18 0625  WBC 7.7  --   --  11.7*  NEUTROABS 4.1  --   --   --   HGB 6.9* 8.5* 9.2* 8.8*  HCT 24.7* 25.0* 27.0* 30.4*  MCV 69.4*  --   --  71.4*  PLT 233  --   --  182   Basic Metabolic Panel: Recent Labs  Lab 09/07/18 2059 09/07/18 2103 09/08/18 0532 09/08/18 0612 09/09/18 0625  NA 140 140 138 141 139  K 3.8 3.8 3.6 3.7 3.8  CL 105 105 103  --  105  CO2 23  --  24  --  24  GLUCOSE 217* 214* 175*  --  92  BUN 30* 27* 19  --  15  CREATININE 0.94 1.00 0.94  --  0.87  CALCIUM 9.3  --  9.1  --  9.0  MG  --   --  1.4*  --  1.6*  PHOS  --   --  3.2  --  3.8   GFR: Estimated Creatinine Clearance: 84.2 mL/min (by C-G formula based on SCr of 0.87 mg/dL). Liver Function Tests: Recent Labs  Lab 09/07/18 2059  AST 23  ALT 18  ALKPHOS 57  BILITOT 0.4  PROT 7.8  ALBUMIN 3.9   No results for input(s): LIPASE, AMYLASE in the last 168 hours. Recent Labs  Lab 09/08/18 0532  AMMONIA 16   Coagulation Profile: Recent Labs  Lab 09/07/18 2059  INR 1.0   Cardiac Enzymes: No results for input(s): CKTOTAL, CKMB, CKMBINDEX, TROPONINI in the last 168 hours. BNP (last 3 results) No results for input(s): PROBNP in the last 8760 hours. HbA1C: Recent Labs    09/08/18 0532  HGBA1C 5.8*   CBG: Recent Labs  Lab 09/08/18 0809 09/08/18 1202 09/08/18 1609 09/08/18 2117 09/09/18 0705  GLUCAP 95 109* 93 98 81   Lipid Profile: No results for input(s): CHOL, HDL, LDLCALC, TRIG, CHOLHDL, LDLDIRECT in the last 72 hours. Thyroid Function Tests: Recent Labs    09/09/18 0625  TSH 3.141   Anemia  Panel: No results for input(s): VITAMINB12, FOLATE, FERRITIN, TIBC, IRON, RETICCTPCT in the last 72 hours. Sepsis Labs: Recent Labs  Lab 09/07/18 2116 09/07/18 2357  LATICACIDVEN 2.8* 1.7    Recent Results (from the past 240 hour(s))  Urine culture     Status: None   Collection Time: 09/07/18  8:59 PM   Specimen: Urine, Random  Result Value Ref Range Status   Specimen Description   Final    URINE, RANDOM Performed at [facility], [address] [place], [place] [address]    Special Requests   Final    NONE Performed at [facility], [address] [place], [place] [address]    Culture  Final    NO GROWTH Performed at [facility], [place] [address] [place], [place] [address]    Report Status 09/09/2018 FINAL  Final  SARS Coronavirus 2 (CEPHEID - Performed in [place] hospital lab), Hosp Order     Status: None   Collection Time: 09/07/18  9:15 PM   Specimen: Nasopharyngeal Swab  Result Value Ref Range Status   SARS Coronavirus 2 NEGATIVE NEGATIVE Final    Comment: (NOTE) If result is NEGATIVE SARS-CoV-2 target nucleic acids are NOT DETECTED. The SARS-CoV-2 RNA is generally detectable in upper and lower  respiratory specimens during the acute phase of infection. The lowest  concentration of SARS-CoV-2 viral copies this assay can detect is 250  copies / mL. A negative result does not preclude SARS-CoV-2 infection  and should not be used as the sole basis for treatment or other  patient management decisions.  A negative result may occur with  improper specimen collection / handling, submission of specimen other  than nasopharyngeal swab, presence of viral mutation(s) within the  areas targeted by this assay, and inadequate number of viral copies  (<250 copies / mL). A negative result must be combined with clinical  observations, patient history, and epidemiological information. If result is POSITIVE SARS-CoV-2 target nucleic acids are DETECTED. The SARS-CoV-2 RNA  is generally detectable in upper and lower  respiratory specimens dur ing the acute phase of infection.  Positive  results are indicative of active infection with SARS-CoV-2.  Clinical  correlation with patient history and other diagnostic information is  necessary to determine patient infection status.  Positive results do  not rule out bacterial infection or co-infection with other viruses. If result is PRESUMPTIVE POSTIVE SARS-CoV-2 nucleic acids MAY BE PRESENT.   A presumptive positive result was obtained on the submitted specimen  and confirmed on repeat testing.  While 2019 novel coronavirus  (SARS-CoV-2) nucleic acids may be present in the submitted sample  additional confirmatory testing may be necessary for epidemiological  and / or clinical management purposes  to differentiate between  SARS-CoV-2 and other Sarbecovirus currently known to infect humans.  If clinically indicated additional testing with an alternate test  methodology [phone]) is advised. The SARS-CoV-2 RNA is generally  detectable in upper and lower respiratory sp ecimens during the acute  phase of infection. The expected result is Negative. Fact Sheet for Patients:  StrictlyIdeas.no Fact Sheet for Healthcare Providers: BankingDealers.co.za This test is not yet approved or cleared by the [place] FDA and has been authorized for detection and/or diagnosis of SARS-CoV-2 by FDA under an Emergency Use Authorization (EUA).  This EUA will remain in effect (meaning this test can be used) for the duration of the COVID-19 declaration under Section 564(b)(1) of the Act, 21 U.S.C. section 360bbb-3(b)(1), unless the authorization is terminated or revoked sooner. Performed at [facility], [address] [place], [place] [address]   MRSA PCR Screening     Status: None   Collection Time: 09/08/18  4:05 AM   Specimen: Nasal Mucosa; Nasopharyngeal  Result Value Ref Range  Status   MRSA by PCR NEGATIVE NEGATIVE Final    Comment:        The GeneXpert MRSA Assay (FDA approved for NASAL specimens only), is one component of a comprehensive MRSA colonization surveillance program. It is not intended to diagnose MRSA infection nor to guide or monitor treatment for MRSA infections. Performed at [facility], [place] [address], [place], [place] [address]  Radiology Studies: Ct Code Stroke Cta Head W/wo Contrast  Result Date: 09/08/2018 EXAM: CT ANGIOGRAPHY HEAD AND NECK CT PERFUSION BRAIN TECHNIQUE: Multidetector CT imaging of the head and neck was performed using the standard protocol during bolus administration of intravenous contrast. Multiplanar CT image reconstructions and MIPs were obtained to evaluate the vascular anatomy. Carotid stenosis measurements (when applicable) are obtained utilizing NASCET criteria, using the distal internal carotid diameter as the denominator. Multiphase CT imaging of the brain was performed following IV bolus contrast injection. Subsequent parametric perfusion maps were calculated using RAPID software. CONTRAST:  OMNIPAQUE IOHEXOL 350 MG/ML SOLN COMPARISON:  Prior CT from earlier the same day. 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 Left common carotid artery arises first, followed by the right common carotid artery, and then the right subclavian artery. The left subclavian artery arises from the proximal left common carotid artery (left brachiocephalic artery). Mild atheromatous plaque about the proximal subclavian arteries bilaterally without flow-limiting stenosis. No other hemodynamically significant stenosis or other acute finding about the arch or origin of the great vessels. Right carotid system: Right common carotid artery patent from its origin to the bifurcation without stenosis. Mild eccentric calcified plaque about the right  bifurcation/proximal right ICA without hemodynamically significant stenosis. Right ICA patent distally to the skull base without stenosis, dissection or occlusion. Left carotid system: Left common carotid artery patent from its origin to the bifurcation without stenosis. No significant atheromatous narrowing about the left bifurcation. Left ICA patent distally to the skull base without stenosis, dissection, or occlusion. Vertebral arteries: Both of the vertebral arteries arise from the subclavian arteries. Right vertebral artery dominant. Vertebral arteries patent within the neck without stenosis, dissection, or occlusion. Skeleton: No acute osseous finding. No discrete lytic or blastic osseous lesions. Other neck: No other acute soft tissue abnormality within the neck. Salivary glands within normal limits. Thyroid normal. No adenopathy. Upper chest: Visualized upper chest demonstrates no acute finding. Scattered coronary artery calcifications noted. Review of the MIP images confirms the above findings CTA HEAD FINDINGS Anterior circulation: Evaluation of the intracranial circulation limited by timing of the contrast bolus as well as motion artifact. Petrous segments widely patent. Mild scattered atheromatous plaque within the cavernous/supraclinoid ICAs without hemodynamically significant stenosis. A1 segments patent bilaterally. Normal anterior communicating artery complex. Anterior cerebral arteries grossly patent to their distal aspects without stenosis. No M1 stenosis or occlusion. Normal MCA bifurcations. Distal MCA branches well perfused and symmetric. Posterior circulation: Dominant right vertebral artery patent to the vertebrobasilar junction without stenosis. Right PICA patent proximally. Hypoplastic left vertebral artery divides at the takeoff of the left PICA, with a small branch ascending towards the vertebrobasilar junction. Left PICA patent proximally. Basilar patent to its distal aspect without  appreciable stenosis. Superior cerebral arteries patent proximally. Left PCA predominantly supplied via the basilar. Predominant fetal type origin of the right PCA supplied via a robust right posterior communicating artery. PCAs perfused to their distal aspects without appreciable stenosis. Venous sinuses: Patent. Anatomic variants: Fetal type origin of the right PCA. Dominant right vertebral artery. Delayed phase: Not performed. Review of the MIP images confirms the above findings CT Brain Perfusion Findings: ASPECTS: No aspects score determine. CBF (<30%) Volume: 0mL Perfusion (Tmax>6.0s) volume: 96mL Mismatch Volume: 96mL Infarction Location:Negative CT perfusion for acute core infarct. Apparent scattered ill-defined perfusion abnormality involving both cerebral hemispheres felt to be artifactual nature due to motion artifact on this exam. IMPRESSION: 1. Negative CTA for  emergent large vessel occlusion. 2. Negative CT perfusion for acute core infarct. Apparent scattered perfusion abnormality involving both cerebral hemispheres felt to be secondary to motion artifact. 3. Mild atherosclerotic change about the right carotid bifurcation and carotid siphons without hemodynamically significant or correctable stenosis. 4. 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 Critical Value/emergent results were discussed by telephone at the time of interpretation on 09/07/2018 at 11:30 pm to Dr. Liu, who verbally acknowledged these results. Electronically Signed   By: Benjamin  McClintock M.D.   On: 09/08/2018 00:08   Ct Head Wo Contrast  Result Date: 09/07/2018 CLINICAL DATA:  Altered mental status (AMS), unclear cause. Collapsed. EXAM: CT HEAD WITHOUT CONTRAST CT CERVICAL SPINE WITHOUT CONTRAST TECHNIQUE: Multidetector CT imaging of the head and cervical spine was performed following the standard protocol without intravenous contrast. Multiplanar CT image reconstructions of the cervical spine were also  generated. COMPARISON:  None. FINDINGS: CT HEAD FINDINGS Brain: No intracranial hemorrhage, mass effect, or midline shift. Mild generalized atrophy and chronic small vessel ischemia. No hydrocephalus. The basilar cisterns are patent. Remote lacunar infarcts in the basal ganglia and left caudate. Possible small remote infarct in left cerebellum. No evidence of territorial infarct or acute ischemia. No extra-axial or intracranial fluid collection. Vascular: No hyperdense vessel. Skull: No fracture or focal lesion. Sinuses/Orbits: Scattered ethmoid air cell opacification. No sinus fluid levels. Visualized mastoid air cells are clear. Included orbits are unremarkable. Other: None. CT CERVICAL SPINE FINDINGS Mild motion artifact limitation, patient had difficulty tolerating the exam and had to be restrained. Alignment: Normal. Skull base and vertebrae: No displaced fracture, motion limits detailed evaluation at multiple levels, particularly skull base and C1-C2. Soft tissues and spinal canal: No prevertebral fluid or swelling. No visible canal hematoma. Upper esophagus is patulous. Disc levels: Diffuse degenerative disc disease. Multilevel facet arthropathy. Upper chest: No acute findings. Other: Carotid calcifications. IMPRESSION: 1. No acute intracranial abnormality. 2. Mild atrophy and chronic small vessel ischemia. 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 3. Motion artifact limits detailed evaluation of the cervical spine. No gross fracture or subluxation. Multilevel degenerative disc disease and facet arthropathy. Electronically Signed   By: Melanie  Sanford M.D.   On: 09/07/2018 21:48   Ct Code Stroke Cta Neck W/wo Contrast  Result Date: 09/08/2018 EXAM: CT ANGIOGRAPHY HEAD AND NECK CT PERFUSION BRAIN TECHNIQUE: Multidetector CT imaging of the head and neck was performed using the standard protocol during bolus administration of intravenous contrast. Multiplanar CT image reconstructions and MIPs were  obtained to evaluate the vascular anatomy. Carotid stenosis measurements (when applicable) are obtained utilizing NASCET criteria, using the distal internal carotid diameter as the denominator. Multiphase CT imaging of the brain was performed following IV bolus contrast injection. Subsequent parametric perfusion maps were calculated using RAPID software. CONTRAST:  <MEASUREM(713Theatre sta365-1[facility]<MEASUREMEN6Theatre sta740-0[facility]<MEASUREMEN403Theatre [phone]<MEASUREMEN(972Theatre [phone]<MEASUREMEN5Theatre sta701-0[facility]<MEASUREMEN(913)Theatre sta803-3[facility]<MEASUREMEN360Theatre sta909[facility]<MEASUREMEN442Theatre [phone] 8[facility]<MEASUREMEN512Theatre sta514 0[facility]<MEASUREMEN340Theatre sta937[facility]<MEASUREMEN816Theatre sta615 6[facility]<MEASUREMEN646Theatre sta570-7[facility]<MEASUREMEN808Theatre [phone]<MEASUREMEN(330)Theatre sta906-8[facility]<MEASUREMEN509Theatre sta863[id number] 350 MG/ML SOLN COMPARISON:  Prior CT from earlier the same day. 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 Left common carotid artery arises first, followed by the right common carotid artery, and then the right subclavian artery. The left subclavian artery arises from the proximal left common carotid artery (left brachiocephalic artery). Mild atheromatous plaque about the proximal subclavian arteries bilaterally without flow-limiting stenosis. No other hemodynamically significant stenosis or other acute finding about the arch or origin of the great vessels. Right carotid system: Right common carotid artery patent from its origin to the bifurcation without stenosis. Mild eccentric calcified plaque about the right bifurcation/proximal right ICA without  hemodynamically significant stenosis. Right ICA patent distally to the skull base without stenosis, dissection or occlusion. Left carotid system: Left common carotid artery patent from its origin to the bifurcation without stenosis. No significant atheromatous narrowing about the left bifurcation. Left ICA patent distally to the skull base without stenosis, dissection, or occlusion. Vertebral arteries: Both of the vertebral arteries arise from the subclavian arteries. Right vertebral artery dominant. Vertebral arteries patent within the neck without stenosis, dissection, or occlusion. Skeleton: No acute osseous finding. No discrete lytic or blastic osseous lesions. Other neck: No other acute soft tissue abnormality  within the neck. Salivary glands within normal limits. Thyroid normal. No adenopathy. Upper chest: Visualized upper chest demonstrates no acute finding. Scattered coronary artery calcifications noted. Review of the MIP images confirms the above findings CTA HEAD FINDINGS Anterior circulation: Evaluation of the intracranial circulation limited by timing of the contrast bolus as well as motion artifact. Petrous segments widely patent. Mild scattered atheromatous plaque within the cavernous/supraclinoid ICAs without hemodynamically significant stenosis. A1 segments patent bilaterally. Normal anterior communicating artery complex. Anterior cerebral arteries grossly patent to their distal aspects without stenosis. No M1 stenosis or occlusion. Normal MCA bifurcations. Distal MCA branches well perfused and symmetric. Posterior circulation: Dominant right vertebral artery patent to the vertebrobasilar junction without stenosis. Right PICA patent proximally. Hypoplastic left vertebral artery divides at the takeoff of the left PICA, with a small branch ascending towards the vertebrobasilar junction. Left PICA patent proximally. Basilar patent to its distal aspect without appreciable stenosis. Superior cerebral arteries patent proximally. Left PCA predominantly supplied via the basilar. Predominant fetal type origin of the right PCA supplied via a robust right posterior communicating artery. PCAs perfused to their distal aspects without appreciable stenosis. Venous sinuses: Patent. Anatomic variants: Fetal type origin of the right PCA. Dominant right vertebral artery. Delayed phase: Not performed. Review of the MIP images confirms the above findings CT Brain Perfusion Findings: ASPECTS: No aspects score determine. CBF (<30%) Volume: 0mL Perfusion (Tmax>6.0s) volume: 96mL Mismatch Volume: 96mL Infarction Location:Negative CT perfusion for acute core infarct. Apparent scattered ill-defined perfusion abnormality involving both  cerebral hemispheres felt to be artifactual nature due to motion artifact on this exam. IMPRESSION: 1. Negative CTA for emergent large vessel occlusion. 2. Negative CT perfusion for acute core infarct. Apparent scattered perfusion abnormality involving both cerebral hemispheres felt to be secondary to motion artifact. 3. Mild atherosclerotic change about the right carotid bifurcation and carotid siphons without hemodynamically significant or correctable stenosis. 4. 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 Critical Value/emergent results were discussed by telephone at the time of interpretation on 09/07/2018 at 11:30 pm to Dr. Verdie Mosher, who verbally acknowledged these results. Electronically Signed   By: Rise Mu M.D.   On: 09/08/2018 00:08   Ct Cervical Spine Wo Contrast  Result Date: 09/07/2018 CLINICAL DATA:  Altered mental status (AMS), unclear cause. Collapsed. EXAM: CT HEAD WITHOUT CONTRAST CT CERVICAL SPINE WITHOUT CONTRAST TECHNIQUE: Multidetector CT imaging of the head and cervical spine was performed following the standard protocol without intravenous contrast. Multiplanar CT image reconstructions of the cervical spine were also generated. COMPARISON:  None. FINDINGS: CT HEAD FINDINGS Brain: No intracranial hemorrhage, mass effect, or midline shift. Mild generalized atrophy and chronic small vessel ischemia. No hydrocephalus. The basilar cisterns are patent. Remote lacunar infarcts in the basal ganglia and left caudate. Possible small remote infarct in left cerebellum. No evidence of territorial infarct or acute ischemia. No extra-axial or intracranial fluid collection. Vascular: No hyperdense  vessel. Skull: No fracture or focal lesion. Sinuses/Orbits: Scattered ethmoid air cell opacification. No sinus fluid levels. Visualized mastoid air cells are clear. Included orbits are unremarkable. Other: None. CT CERVICAL SPINE FINDINGS Mild motion artifact limitation, patient  had difficulty tolerating the exam and had to be restrained. Alignment: Normal. Skull base and vertebrae: No displaced fracture, motion limits detailed evaluation at multiple levels, particularly skull base and C1-C2. Soft tissues and spinal canal: No prevertebral fluid or swelling. No visible canal hematoma. Upper esophagus is patulous. Disc levels: Diffuse degenerative disc disease. Multilevel facet arthropathy. Upper chest: No acute findings. Other: Carotid calcifications. IMPRESSION: 1. No acute intracranial abnormality. 2. Mild atrophy and chronic small vessel ischemia. 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 3. Motion artifact limits detailed evaluation of the cervical spine. No gross fracture or subluxation. Multilevel degenerative disc disease and facet arthropathy. Electronically Signed   By: Narda Rutherford M.D.   On: 09/07/2018 21:48   Mr Brain Wo Contrast  Result Date: 09/09/2018 CLINICAL DATA:  Encephalopathy EXAM: MRI HEAD WITHOUT CONTRAST TECHNIQUE: Multiplanar, multiecho pulse sequences of the brain and surrounding structures were obtained without intravenous contrast. COMPARISON:  CTA head neck 09/07/2018 FINDINGS: Examination is severely degraded by motion. The examination was discontinued prematurely due to patient inability to cooperate with technologist's instructions and attempts to remove the head coil. Axial and coronal diffusion-weighted imaging and 3 anatomic imaging sequences were obtained. There is no acute infarct, acute hemorrhage or extra-axial collection. The midline structures are normal. There is no midline shift or mass effect. Early confluent hyperintense T2-weighted signal of the periventricular and deep white matter, most commonly due to chronic ischemic microangiopathy. IMPRESSION: 1. Truncated and motion degraded examination. 2. No acute intracranial abnormality. 3. 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 Electronically Signed   By: Deatra Robinson M.D.   On: 09/09/2018 04:00   Ct Code Stroke Cta Cerebral Perfusion W/wo Contrast  Result Date: 09/08/2018 EXAM: CT ANGIOGRAPHY HEAD AND NECK CT PERFUSION BRAIN TECHNIQUE: Multidetector CT imaging of the head and neck was performed using the standard protocol during bolus administration of intravenous contrast. Multiplanar CT image reconstructions and MIPs were obtained to evaluate the vascular anatomy. Carotid stenosis measurements (when applicable) are obtained utilizing NASCET criteria, using the distal internal carotid diameter as the denominator. Multiphase CT imaging of the brain was performed following IV bolus contrast injection. Subsequent parametric perfusion maps were calculated using RAPID software. CONTRAST:  OMNIPAQUE IOHEXOL 350 MG/ML SOLN COMPARISON:  Prior CT from earlier the same day. 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 Left common carotid artery arises first, followed by the right common carotid artery, and then the right subclavian artery. The left subclavian artery arises from the proximal left common carotid artery (left brachiocephalic artery). Mild atheromatous plaque about the proximal subclavian arteries bilaterally without flow-limiting stenosis. No other hemodynamically significant stenosis or other acute finding about the arch or origin of the great vessels. Right carotid system: Right common carotid artery patent from its origin to the bifurcation without stenosis. Mild eccentric calcified plaque about the right bifurcation/proximal right ICA without hemodynamically significant stenosis. Right ICA patent distally to the skull base without stenosis, dissection or occlusion. Left carotid system: Left common carotid artery patent from its origin to the bifurcation without stenosis. No significant atheromatous narrowing about the left bifurcation. Left ICA patent distally to the skull base without  stenosis, dissection, or occlusion. Vertebral arteries: Both of the  vertebral arteries arise from the subclavian arteries. Right vertebral artery dominant. Vertebral arteries patent within the neck without stenosis, dissection, or occlusion. Skeleton: No acute osseous finding. No discrete lytic or blastic osseous lesions. Other neck: No other acute soft tissue abnormality within the neck. Salivary glands within normal limits. Thyroid normal. No adenopathy. Upper chest: Visualized upper chest demonstrates no acute finding. Scattered coronary artery calcifications noted. Review of the MIP images confirms the above findings CTA HEAD FINDINGS Anterior circulation: Evaluation of the intracranial circulation limited by timing of the contrast bolus as well as motion artifact. Petrous segments widely patent. Mild scattered atheromatous plaque within the cavernous/supraclinoid ICAs without hemodynamically significant stenosis. A1 segments patent bilaterally. Normal anterior communicating artery complex. Anterior cerebral arteries grossly patent to their distal aspects without stenosis. No M1 stenosis or occlusion. Normal MCA bifurcations. Distal MCA branches well perfused and symmetric. Posterior circulation: Dominant right vertebral artery patent to the vertebrobasilar junction without stenosis. Right PICA patent proximally. Hypoplastic left vertebral artery divides at the takeoff of the left PICA, with a small branch ascending towards the vertebrobasilar junction. Left PICA patent proximally. Basilar patent to its distal aspect without appreciable stenosis. Superior cerebral arteries patent proximally. Left PCA predominantly supplied via the basilar. Predominant fetal type origin of the right PCA supplied via a robust right posterior communicating artery. PCAs perfused to their distal aspects without appreciable stenosis. Venous sinuses: Patent. Anatomic variants: Fetal type origin of the right PCA. Dominant right  vertebral artery. Delayed phase: Not performed. Review of the MIP images confirms the above findings CT Brain Perfusion Findings: ASPECTS: No aspects score determine. CBF (<30%) Volume: 0mL Perfusion (Tmax>6.0s) volume: 96mL Mismatch Volume: 96mL Infarction Location:Negative CT perfusion for acute core infarct. Apparent scattered ill-defined perfusion abnormality involving both cerebral hemispheres felt to be artifactual nature due to motion artifact on this exam. IMPRESSION: 1. Negative CTA for emergent large vessel occlusion. 2. Negative CT perfusion for acute core infarct. Apparent scattered perfusion abnormality involving both cerebral hemispheres felt to be secondary to motion artifact. 3. Mild atherosclerotic change about the right carotid bifurcation and carotid siphons without hemodynamically significant or correctable stenosis. 4. 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 Critical Value/emergent results were discussed by telephone at the time of interpretation on 09/07/2018 at 11:30 pm to Dr. Verdie Mosher, who verbally acknowledged these results. Electronically Signed   By: Rise Mu M.D.   On: 09/08/2018 00:08   Dg Chest Port 1 View  Result Date: 09/07/2018 CLINICAL DATA:  Unresponsive. EXAM: PORTABLE CHEST 1 VIEW COMPARISON:  None. FINDINGS: The heart size and mediastinal contours are within normal limits. Both lungs are clear. The visualized skeletal structures are unremarkable. IMPRESSION: No active disease. Electronically Signed   By: Katherine Mantle M.D.   On: 09/07/2018 21:59        Scheduled Meds:  amLODipine  5 mg Oral q morning - 10a   aspirin EC  81 mg Oral BID   atorvastatin  40 mg Oral QPM   Chlorhexidine Gluconate Cloth  6 each Topical Q0600   insulin aspart  0-15 Units Subcutaneous TID WC   lisinopril  20 mg Oral q morning - 10a   Continuous Infusions:  lactated ringers 50 mL/hr at 09/09/18 0948     LOS: 1 day        Glade Lloyd, MD [facility] 09/09/2018, 10:49 AM

## 2018-09-09 NOTE — Progress Notes (Signed)
EEG complete - results pending 

## 2018-09-09 NOTE — Evaluation (Signed)
Clinical/Bedside Swallow Evaluation Patient Details  Name: Desirie Minteer MRN: [id number] Date of Birth: 10/27/1965  Today's Date: 09/09/2018 Time: SLP Start Time (ACUTE ONLY): 30 SLP Stop Time (ACUTE ONLY): 1545 SLP Time Calculation (min) (ACUTE ONLY): 22 min  Past Medical History:  Past Medical History:  Diagnosis Date  . CVA (cerebral vascular accident) ([place])   . Depression   . Hypertension   . Incontinence    Past Surgical History: History reviewed. No pertinent surgical history. HPI:  Pt is a 53 yo female who presents s/p syncopal event with CPR started at her group home; peripheral pulses found by EMS upon arrival. She was found to be anemic. PMH includes: CVA, depression, HTN   Assessment / Plan / Recommendation Clinical Impression  Pt is impulsive, trying to climb out of her chair and taking very large bites at a rapid rate. Despite this, she has no overt signs of aspiration and clears her oral cavity well, utilizing a finger sweep at times to clear mild oral residue from dry crackers. Would continue regular solids and thin liquids, although pt would likely benefit from set-up and intermittent assist to facilitate meals from a cognitive standpoint. SLP to sign off.  SLP Visit Diagnosis: Dysphagia, unspecified (R13.10)    Aspiration Risk  Mild aspiration risk    Diet Recommendation Regular;Thin liquid   Liquid Administration via: Cup;Straw Medication Administration: Whole meds with liquid Supervision: Patient able to self feed;Intermittent supervision to cue for compensatory strategies;Comment(set-up assist) Compensations: Slow rate;Small sips/bites Postural Changes: Seated upright at 90 degrees    Other  Recommendations Oral Care Recommendations: Oral care BID   Follow up Recommendations 24 hour supervision/assistance      Frequency and Duration            Prognosis        Swallow Study   General HPI: Pt is a 53 yo female who presents s/p syncopal event  with CPR started at her group home; peripheral pulses found by EMS upon arrival. She was found to be anemic. PMH includes: CVA, depression, HTN Type of Study: Bedside Swallow Evaluation Previous Swallow Assessment: none in chart Diet Prior to this Study: Regular;Thin liquids Temperature Spikes Noted: Yes(100.6) Respiratory Status: Room air History of Recent Intubation: No Behavior/Cognition: Alert;Cooperative;Impulsive Oral Cavity Assessment: Within Functional Limits Oral Care Completed by SLP: No Oral Cavity - Dentition: Poor condition Vision: Functional for self-feeding Self-Feeding Abilities: Able to feed self Patient Positioning: Upright in chair Baseline Vocal Quality: Normal;Other (comment)(speech mildly dysarthric but intelligible) Volitional Swallow: Able to elicit    Oral/Motor/Sensory Function Overall Oral Motor/Sensory Function: Within functional limits   Ice Chips Ice chips: Not tested   Thin Liquid Thin Liquid: Within functional limits Presentation: Cup;Self Fed;Straw    Nectar Thick Nectar Thick Liquid: Not tested   Honey Thick Honey Thick Liquid: Not tested   Puree Puree: Within functional limits Presentation: Self Fed;Spoon   Solid     Solid: Within functional limits Presentation: [place] 09/09/2018,4:22 PM  Pollyann Glen, M.A. [place] Acute Environmental education officer [phone] Office [phone]

## 2018-09-09 NOTE — Progress Notes (Signed)
Spoke with patients son and gave him updates on his mothers current status.

## 2018-09-09 NOTE — Evaluation (Signed)
Physical Therapy Evaluation Patient Details Name: [name] PerchesJennifer Scism MRN: [id number] DOB: 1965-08-28 Today's Date: 09/09/2018   History of Present Illness  Pt is a 53 y/o female admitted secondary to episode of unresponsiveness. Pt found to have anemia and also thought to have acute encephalopath. PMH includes HTN, depression, and CVA.   Clinical Impression  Pt admitted secondary to problem above with deficits below. Pt complaining of increased pain in LLE and would not bear weight on LLE during transfer. Pt requiring min to mod A using RW and multiple safety cues for safe use of RW to get to chair. Pt reports she lives in group home, however, was mod I with use of RW. Feel pt will progress well once pain controlled, however, if unable to progress, may require SNF level therapies. Will continue to follow acutely to maximize functional mobility independence and safety.     Follow Up Recommendations Supervision/Assistance - 24 hour(HHPT vs SNF pending progression)    Equipment Recommendations  Other (comment)(TBD)    Recommendations for Other Services OT consult     Precautions / Restrictions Precautions Precautions: Fall Restrictions Weight Bearing Restrictions: No      Mobility  Bed Mobility Overal bed mobility: Needs Assistance Bed Mobility: Supine to Sit Rolling: Supervision   Supine to sit: Supervision     General bed mobility comments: Supervision for safety and line management. Required cues to wait for PT prior to standing.   Transfers Overall transfer level: Needs assistance Equipment used: Rolling walker (2 wheeled) Transfers: Sit to/from UGI CorporationStand;Stand Pivot Transfers Sit to Stand: Min assist Stand pivot transfers: Min assist;Mod assist       General transfer comment: Pt requiring min A to stand, however, once standing, pt screaming "my leg hurts!" Pt not bearing weight on LLE. Required constant safety cues to keep both hands on RW. Pt requiring min to mod A to  safely transfer to chair. Pt would not put weight on LLE and pivoted to chair on RLE. Pt easily distracted and once to chair, pt requesting something to eat.   Ambulation/Gait             General Gait Details: unable secondary to pain.   Stairs            Wheelchair Mobility    Modified Rankin (Stroke Patients Only)       Balance Overall balance assessment: Needs assistance Sitting-balance support: No upper extremity supported;Feet supported Sitting balance-Leahy Scale: Good     Standing balance support: Bilateral upper extremity supported;During functional activity Standing balance-Leahy Scale: Poor Standing balance comment: Reliant on BUE and external support                              Pertinent Vitals/Pain Pain Assessment: Faces Faces Pain Scale: Hurts even more Pain Location: LLE  Pain Descriptors / Indicators: Cramping Pain Intervention(s): Limited activity within patient's tolerance;Monitored during session;Repositioned    Home Living Family/patient expects to be discharged to:: Group home                 Additional Comments: Pt from group home; unsure of which one.     Prior Function Level of Independence: Independent with assistive device(s)         Comments: Pt reports she used RW for mobility, however, was overall mod I with ADL tasks.      Hand Dominance        Extremity/Trunk Assessment   Upper  Extremity Assessment Upper Extremity Assessment: Defer to OT evaluation    Lower Extremity Assessment Lower Extremity Assessment: LLE deficits/detail LLE Deficits / Details: Pt unable to bear weight on LLE secondary to increased cramping.     Cervical / Trunk Assessment Cervical / Trunk Assessment: Normal  Communication   Communication: No difficulties  Cognition Arousal/Alertness: Awake/alert Behavior During Therapy: Restless Overall Cognitive Status: No family/caregiver present to determine baseline cognitive  functioning                                 General Comments: Pt with poor safety awareness and required multiple safety cues during mobility tasks. Pt seemed somewhat restless throughout session. Unsure of pt's baseline.       General Comments      Exercises     Assessment/Plan    PT Assessment Patient needs continued PT services  PT Problem List Decreased strength;Decreased activity tolerance;Decreased balance;Decreased mobility;Decreased cognition;Decreased knowledge of use of DME;Decreased safety awareness;Decreased knowledge of precautions;Pain       PT Treatment Interventions DME instruction;Gait training;Functional mobility training;Therapeutic activities;Therapeutic exercise;Balance training;Patient/family education    PT Goals (Current goals can be found in the Care Plan section)  Acute Rehab PT Goals Patient Stated Goal: "to get a snack" PT Goal Formulation: With patient Time For Goal Achievement: 09/23/18 Potential to Achieve Goals: Good    Frequency Min 3X/week   Barriers to discharge        Co-evaluation               AM-PAC PT "6 Clicks" Mobility  Outcome Measure Help needed turning from your back to your side while in a flat bed without using bedrails?: None Help needed moving from lying on your back to sitting on the side of a flat bed without using bedrails?: None Help needed moving to and from a bed to a chair (including a wheelchair)?: A Lot Help needed standing up from a chair using your arms (e.g., wheelchair or bedside chair)?: Total Help needed to walk in hospital room?: A Lot Help needed climbing 3-5 steps with a railing? : A Lot 6 Click Score: 15    End of Session Equipment Utilized During Treatment: Gait belt Activity Tolerance: Patient limited by pain Patient left: in chair;with chair alarm set;with call bell/phone within reach Nurse Communication: Mobility status PT Visit Diagnosis: Muscle weakness (generalized)  (M62.81);Difficulty in walking, not elsewhere classified (R26.2);Pain Pain - Right/Left: Left Pain - part of body: Leg    Time: 1610-9604 PT Time Calculation (min) (ACUTE ONLY): 15 min   Charges:   PT Evaluation $PT Eval Moderate Complexity: 1 Mod          Leighton Ruff, PT, DPT  Acute Rehabilitation Services  Pager: [phone] Office: [phone]   Rudean Hitt 09/09/2018, 5:50 PM

## 2018-09-09 NOTE — Progress Notes (Signed)
PT Cancellation Note  Patient Details Name: [name] [name] MRN: [id number] DOB: Apr 02, 1965   Cancelled Treatment:    Reason Eval/Treat Not Completed: Patient at procedure or test/unavailable Will follow up as schedule allows.   Leighton Ruff, PT, DPT  Acute Rehabilitation Services  Pager: [phone] Office: [phone]  Rudean Hitt 09/09/2018, 12:32 PM

## 2018-09-10 ENCOUNTER — Inpatient Hospital Stay (HOSPITAL_COMMUNITY): Payer: Medicaid Other

## 2018-09-10 LAB — AMMONIA: Ammonia: 18 umol/L (ref 9–35)

## 2018-09-10 LAB — COMPREHENSIVE METABOLIC PANEL
ALT: 16 U/L (ref 0–44)
AST: 22 U/L (ref 15–41)
Albumin: 3.2 g/dL — ABNORMAL LOW (ref 3.5–5.0)
Alkaline Phosphatase: 53 U/L (ref 38–126)
Anion gap: 11 (ref 5–15)
BUN: 18 mg/dL (ref 6–20)
CO2: 22 mmol/L (ref 22–32)
Calcium: 8.8 mg/dL — ABNORMAL LOW (ref 8.9–10.3)
Chloride: 103 mmol/L (ref 98–111)
Creatinine, Ser: 0.89 mg/dL (ref 0.44–1.00)
GFR calc Af Amer: >60 mL/min (ref >60)
GFR calc non Af Amer: >60 mL/min (ref >60)
Glucose, Bld: 135 mg/dL — ABNORMAL HIGH (ref 70–99)
Potassium: 3.7 mmol/L (ref 3.5–5.1)
Sodium: 136 mmol/L (ref 135–145)
Total Bilirubin: 0.7 mg/dL (ref 0.3–1.2)
Total Protein: 7 g/dL (ref 6.5–8.1)

## 2018-09-10 LAB — CBC
HCT: 26.2 % — ABNORMAL LOW (ref 36.0–46.0)
Hemoglobin: 7.9 g/dL — ABNORMAL LOW (ref 12.0–15.0)
MCH: 21.2 pg — ABNORMAL LOW (ref 26.0–34.0)
MCHC: 30.2 g/dL (ref 30.0–36.0)
MCV: 70.2 fL — ABNORMAL LOW (ref 80.0–100.0)
Platelets: 194 10*3/uL (ref 150–400)
RBC: 3.73 MIL/uL — ABNORMAL LOW (ref 3.87–5.11)
RDW: 19.8 % — ABNORMAL HIGH (ref 11.5–15.5)
WBC: 10.3 10*3/uL (ref 4.0–10.5)
nRBC: 0 % (ref 0.0–0.2)

## 2018-09-10 LAB — GLUCOSE, CAPILLARY
Glucose-Capillary: 112 mg/dL — ABNORMAL HIGH (ref 70–99)
Glucose-Capillary: 162 mg/dL — ABNORMAL HIGH (ref 70–99)
Glucose-Capillary: 111 mg/dL — ABNORMAL HIGH (ref 70–99)
Glucose-Capillary: 193 mg/dL — ABNORMAL HIGH (ref 70–99)

## 2018-09-10 LAB — FOLATE: Folate: 6.5 ng/mL (ref >5.9)

## 2018-09-10 LAB — VITAMIN B12: Vitamin B-12: 389 pg/mL (ref 180–914)

## 2018-09-10 LAB — MAGNESIUM: Magnesium: 1.9 mg/dL (ref 1.7–2.4)

## 2018-09-10 LAB — PHOSPHORUS: Phosphorus: 4.4 mg/dL (ref 2.5–4.6)

## 2018-09-10 NOTE — Progress Notes (Signed)
Patient ID: [name] PerchesJennifer [name], female   DOB: 06/09/1965, 53 y.o.   MRN: [id number]  PROGRESS NOTE    [name] PerchesJennifer [name]  JYN:[id number]:[id number] DOB: 06/09/1965 DOA: 09/07/2018 PCP: Patient, No Pcp Per   Brief Narrative:  53 year old female with history of CVA, depression, hypertension and incontinence presented with unresponsiveness and was initially admitted to ICU.  Apparently patient was found unresponsive and CPR was started by group home.  When EMS arrived, she had peripheral pulses.  CT of the head was negative for acute abnormality.  No large vessel occlusion on CTA noted.  Neurology consulted.  She was found to have hemoglobin of 6.9 at outside ED with positive Hemoccult and was given 1 unit of blood.  [facility]  Psychiatry was also consulted.  Assessment & Plan:   Acute encephalopathy: Metabolic versus toxic -Unsure etiology.  Patient was found unresponsive in the group home and initially CPR was started.  When EMS arrived, she had peripheral pulses  -CT of the head was negative for acute abnormality.  No large vessel occlusion on CTA of the brain. -Neurology was consulted.  MRI of the brain was a very poor quality because the patient moving a lot but showed no evidence of acute intracranial abnormality.  EEG on 09/09/2018 did not show any seizure-like activity.  Neurology available as needed. -Psychiatry evaluation appreciated: Psychiatry has signed off. -Fall precautions.  Continue neurochecks. -Diet as per SLP recommendations. -B12, folate and ammonia levels normal.  Anemia: Unsure if it is acute or chronic -Presented with hemoglobin of 6.9.  No prior hemoglobin in the system.  Was transfused 1 unit of packed red cells on 09/08/2018.  Hemoglobin 7.9 this morning.  FOBT was positive.  No overt melena or hematochezia.  DC'd aspirin 81 mg twice a day. -Continue Protonix 40 mg daily.  Might need outpatient GI evaluation.  Hypertension -Monitor  blood pressure.  Continue amlodipine and lisinopril  Prediabetes/question of diabetes  -Blood sugars stable.  Hemoglobin A1c 5.8.  Continue CBG with SSI.  Outpatient follow-up.  Patient apparently was on metformin per med rec.  Hypomagnesemia -Improved.  Leukocytosis -Reactive.  Monitor.  Generalized deconditioning -Patient lives in a group home.  PT recommended home health PT versus SNF.  Patient is still very deconditioned and still very slow to respond.  I do not think she is safe to be discharged today.  Hopefully PT can work with her again today.   DVT prophylaxis: SCDs Code Status: Full Family Communication: Spoke to patient at bedside. Disposition Plan: Might need rehab placement versus group home with PT in 1 to 2 days if clinically stable.  Consultants: PCCM/neurology/psychiatry  Procedures: None  Antimicrobials: None   Subjective: Patient seen and examined at bedside.  She is awake, withdrawn, answers some questions but very slow to respond.  Feels weak.  No overnight fever or vomiting reported by nursing staff. Objective: Vitals:   09/09/18 2111 09/10/18 0335 09/10/18 0451 09/10/18 0914  BP: (!) 139/94  122/78 111/81  Pulse: 87  81 83  Resp: 17  18 18   Temp: 98.3 F (36.8 C)  97.6 F (36.4 C) 98.2 F (36.8 C)  TempSrc:   Oral Oral  SpO2: 100%  98% 100%  Weight: 80.3 kg 80.3 kg    Height:        Intake/Output Summary (Last 24 hours) at 09/10/2018 1031 Last data filed at 09/10/2018 [id number] Gross per 24 hour  Intake 1865.06 ml  Output 1325  ml  Net 540.06 ml   Filed Weights   09/08/18 0400 09/09/18 2111 09/10/18 0335  Weight: 80.5 kg 80.3 kg 80.3 kg    Examination:  General exam: Appears calm and comfortable.  Looks older than stated age.  No acute distress.  Still very slow to respond to questions.  She is alert awake and oriented x3.   Respiratory system: Bilateral decreased breath sounds at bases, no wheezing Cardiovascular system: Rate controlled,  S1-S2 heard Gastrointestinal system: Abdomen is nondistended, soft and nontender. Normal bowel sounds heard. Extremities: No cyanosis, clubbing, edema    Data Reviewed: I have personally reviewed following labs and imaging studies  CBC: Recent Labs  Lab 09/07/18 2059 09/07/18 2103 09/08/18 0612 09/09/18 0625 09/10/18 0049  WBC 7.7  --   --  11.7* 10.3  NEUTROABS 4.1  --   --   --   --   HGB 6.9* 8.5* 9.2* 8.8* 7.9*  HCT 24.7* 25.0* 27.0* 30.4* 26.2*  MCV 69.4*  --   --  71.4* 70.2*  PLT 233  --   --  182 194   Basic Metabolic Panel: Recent Labs  Lab 09/07/18 2059 09/07/18 2103 09/08/18 0532 09/08/18 0612 09/09/18 0625 09/10/18 0049  NA 140 140 138 141 139 136  K 3.8 3.8 3.6 3.7 3.8 3.7  CL 105 105 103  --  105 103  CO2 23  --  24  --  24 22  GLUCOSE 217* 214* 175*  --  92 135*  BUN 30* 27* 19  --  15 18  CREATININE 0.94 1.00 0.94  --  0.87 0.89  CALCIUM 9.3  --  9.1  --  9.0 8.8*  MG  --   --  1.4*  --  1.6* 1.9  PHOS  --   --  3.2  --  3.8 4.4   GFR: Estimated Creatinine Clearance: 82.3 mL/min (by C-G formula based on SCr of 0.89 mg/dL). Liver Function Tests: Recent Labs  Lab 09/07/18 2059 09/10/18 0049  AST 23 22  ALT 18 16  ALKPHOS 57 53  BILITOT 0.4 0.7  PROT 7.8 7.0  ALBUMIN 3.9 3.2*   No results for input(s): LIPASE, AMYLASE in the last 168 hours. Recent Labs  Lab 09/08/18 0532 09/10/18 0049  AMMONIA 16 18   Coagulation Profile: Recent Labs  Lab 09/07/18 2059  INR 1.0   Cardiac Enzymes: No results for input(s): CKTOTAL, CKMB, CKMBINDEX, TROPONINI in the last 168 hours. BNP (last 3 results) No results for input(s): PROBNP in the last 8760 hours. HbA1C: Recent Labs    09/08/18 0532  HGBA1C 5.8*   CBG: Recent Labs  Lab 09/09/18 0705 09/09/18 1123 09/09/18 1601 09/09/18 2111 09/10/18 0654  GLUCAP 81 249* 122* 147* 112*   Lipid Profile: No results for input(s): CHOL, HDL, LDLCALC, TRIG, CHOLHDL, LDLDIRECT in the last 72 hours.  Thyroid Function Tests: Recent Labs    09/09/18 0625  TSH 3.141   Anemia Panel: Recent Labs    09/10/18 0049  VITAMINB12 389  FOLATE 6.5   Sepsis Labs: Recent Labs  Lab 09/07/18 2116 09/07/18 2357  LATICACIDVEN 2.8* 1.7    Recent Results (from the past 240 hour(s))  Urine culture     Status: None   Collection Time: 09/07/18  8:59 PM   Specimen: Urine, Random  Result Value Ref Range Status   Specimen Description   Final    URINE, RANDOM Performed at [facility], [address] [place],  [place] [address]    Special Requests   Final    NONE Performed at [facility], [address] [place], [place] [address]    Culture   Final    NO GROWTH Performed at [facility], [place] [address] [place], [place] [address]    Report Status 09/09/2018 FINAL  Final  SARS Coronavirus 2 (CEPHEID - Performed in [place] hospital lab), Hosp Order     Status: None   Collection Time: 09/07/18  9:15 PM   Specimen: Nasopharyngeal Swab  Result Value Ref Range Status   SARS Coronavirus 2 NEGATIVE NEGATIVE Final    Comment: (NOTE) If result is NEGATIVE SARS-CoV-2 target nucleic acids are NOT DETECTED. The SARS-CoV-2 RNA is generally detectable in upper and lower  respiratory specimens during the acute phase of infection. The lowest  concentration of SARS-CoV-2 viral copies this assay can detect is 250  copies / mL. A negative result does not preclude SARS-CoV-2 infection  and should not be used as the sole basis for treatment or other  patient management decisions.  A negative result may occur with  improper specimen collection / handling, submission of specimen other  than nasopharyngeal swab, presence of viral mutation(s) within the  areas targeted by this assay, and inadequate number of viral copies  (<250 copies / mL). A negative result must be combined with clinical  observations, patient history, and epidemiological information. If result is POSITIVE SARS-CoV-2 target  nucleic acids are DETECTED. The SARS-CoV-2 RNA is generally detectable in upper and lower  respiratory specimens dur ing the acute phase of infection.  Positive  results are indicative of active infection with SARS-CoV-2.  Clinical  correlation with patient history and other diagnostic information is  necessary to determine patient infection status.  Positive results do  not rule out bacterial infection or co-infection with other viruses. If result is PRESUMPTIVE POSTIVE SARS-CoV-2 nucleic acids MAY BE PRESENT.   A presumptive positive result was obtained on the submitted specimen  and confirmed on repeat testing.  While 2019 novel coronavirus  (SARS-CoV-2) nucleic acids may be present in the submitted sample  additional confirmatory testing may be necessary for epidemiological  and / or clinical management purposes  to differentiate between  SARS-CoV-2 and other Sarbecovirus currently known to infect humans.  If clinically indicated additional testing with an alternate test  methodology [phone]) is advised. The SARS-CoV-2 RNA is generally  detectable in upper and lower respiratory sp ecimens during the acute  phase of infection. The expected result is Negative. Fact Sheet for Patients:  StrictlyIdeas.no Fact Sheet for Healthcare Providers: BankingDealers.co.za This test is not yet approved or cleared by the [place] FDA and has been authorized for detection and/or diagnosis of SARS-CoV-2 by FDA under an Emergency Use Authorization (EUA).  This EUA will remain in effect (meaning this test can be used) for the duration of the COVID-19 declaration under Section 564(b)(1) of the Act, 21 U.S.C. section 360bbb-3(b)(1), unless the authorization is terminated or revoked sooner. Performed at [facility], [address] [place], [place] [address]   MRSA PCR Screening     Status: None   Collection Time: 09/08/18  4:05 AM   Specimen: Nasal  Mucosa; Nasopharyngeal  Result Value Ref Range Status   MRSA by PCR NEGATIVE NEGATIVE Final    Comment:        The GeneXpert MRSA Assay (FDA approved for NASAL specimens only), is one component of a comprehensive MRSA colonization surveillance program. It  is not intended to diagnose MRSA infection nor to guide or monitor treatment for MRSA infections. Performed at [facility], [address] [place], [place] [address]          Radiology Studies: Mr Brain 64Wo Contrast  Result Date: 09/09/2018 CLINICAL DATA:  Encephalopathy EXAM: MRI HEAD WITHOUT CONTRAST TECHNIQUE: Multiplanar, multiecho pulse sequences of the brain and surrounding structures were obtained without intravenous contrast. COMPARISON:  CTA head neck 09/07/2018 FINDINGS: Examination is severely degraded by motion. The examination was discontinued prematurely due to patient inability to cooperate with technologist's instructions and attempts to remove the head coil. Axial and coronal diffusion-weighted imaging and 3 anatomic imaging sequences were obtained. There is no acute infarct, acute hemorrhage or extra-axial collection. The midline structures are normal. There is no midline shift or mass effect. Early confluent hyperintense T2-weighted signal of the periventricular and deep white matter, most commonly due to chronic ischemic microangiopathy. IMPRESSION: 1. Truncated and motion degraded examination. 2. No acute intracranial abnormality. 3. 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 Electronically Signed   By: Deatra RobinsonKevin  Herman M.D.   On: 09/09/2018 04:00        Scheduled Meds: . amLODipine  5 mg Oral q morning - 10a  . atorvastatin  40 mg Oral QPM  . Chlorhexidine Gluconate Cloth  6 each Topical Q0600  . insulin aspart  0-15 Units Subcutaneous TID WC  . lisinopril  20 mg Oral q morning - 10a  . pantoprazole  40 mg Oral Daily   Continuous Infusions: . lactated ringers 50 mL/hr at 09/10/18  0936     LOS: 2 days        Glade LloydKshitiz Zahirah Cheslock, MD [facility] 09/10/2018, 10:31 AM

## 2018-09-10 NOTE — Progress Notes (Signed)
Occupational Therapy Evaluation Patient Details Name: [name] [name] MRN: [id number] DOB: [birth year] Today's Date: 09/10/2018    History of Present Illness Pt is a 53 y/o female admitted secondary to episode of unresponsiveness. Pt found to have anemia and also thought to have acute encephalopath. PMH includes HTN, depression, and CVA.    Clinical Impression   PTA, pt lived at [facility]  Spoke with [name] [name] of Group home who is familiar with [name] [name] and she states that she normally uses a rollator independently and staff assists with bathing and dressing due to pt being unable to thoroughly clean self at baseline. Pt is cognitively impaired at baseline and pt is most likely close to her baseline cognition. Pt able to mobilize to sink, complete grooming and bathing with minguard A, then able to ambulate @ 40 ft with 1 rest break with minguard A.  A. Pt has an abnormal gait pattern at baseline, which was confirmed with [name] [name].  Discussed session with PT given difference is assist level.  Feel pt is most likely close to her baseline level of performance. Recommend pt DC back to group home with HHOT. [name] [name] states that staff can assist with ADL as needed.  Will follow acutely.     Follow Up Recommendations  Home health OT;Supervision/Assistance - 24 hour    Equipment Recommendations  None recommended by OT    Recommendations for Other Services       Precautions / Restrictions Precautions Precautions: Fall      Mobility Bed Mobility Overal bed mobility: Modified Independent                Transfers Overall transfer level: Needs assistance Equipment used: Rolling walker (2 wheeled) Transfers: Sit to/from Stand Sit to Stand: Min guard         General transfer comment: Pt normally uses rollator and pulshes up/down on rollator    Balance     Sitting balance-Leahy Scale: Good       Standing balance-Leahy Scale: Poor                              ADL either performed or assessed with clinical judgement   ADL Overall ADL's : Needs assistance/impaired Eating/Feeding: Set up   Grooming: Set up;Standing   Upper Body Bathing: Set up;Sitting;Supervision/ safety   Lower Body Bathing: Min guard;Sit to/from stand   Upper Body Dressing : Set up;Sitting   Lower Body Dressing: Min guard;Sit to/from stand   Toilet Transfer: Min guard;RW;BSC;Ambulation     Toileting - Clothing Manipulation Details (indicate cue type and reason): Max A. Pt incontinenet at baseline     Functional mobility during ADLs: Min guard;Rolling walker;Cueing for safety       Vision         Perception     Praxis      Pertinent Vitals/Pain Pain Assessment: No/denies pain Faces Pain Scale: Hurts even more Pain Location: Both LEs Pain Descriptors / Indicators: Grimacing;Guarding;Discomfort;Sore Pain Intervention(s): Monitored during session;Limited activity within patient's tolerance     Hand Dominance Right   Extremity/Trunk Assessment Upper Extremity Assessment Upper Extremity Assessment: Overall WFL for tasks assessed(ataxia but functional)   Lower Extremity Assessment Lower Extremity Assessment: Defer to PT evaluation   Cervical / Trunk Assessment Cervical / Trunk Assessment: Kyphotic;Other exceptions(forward at hips; B knee flexion)   Communication     Cognition Arousal/Alertness: Awake/alert Behavior During Therapy: WFL for tasks assessed/performed Overall Cognitive Status:  No family/caregiver present to determine baseline cognitive functioning                                 General Comments: poor safety awareness but most likley close to baseline. Pt able to give information regarding her sons, but did not know details. [name] BlaseLawanda confirmed that this is normal. When asked if tasks were more difficult, pt stated she felt she was about "normal" but that she usually "does it a different way"   General Comments        Exercises     Shoulder Instructions      Home Living Family/patient expects to be discharged to:: Group home                                 Additional Comments[facility]      Prior Functioning/Environment Level of Independence: Independent with assistive device(s)        Comments: facility manages foof and medications        OT Problem List: Decreased activity tolerance;Impaired balance (sitting and/or standing);Decreased safety awareness      OT Treatment/Interventions: Self-care/ADL training;Therapeutic exercise;DME and/or AE instruction;Therapeutic activities;Cognitive remediation/compensation;Patient/family education;Balance training    OT Goals(Current goals can be found in the care plan section) Acute Rehab OT Goals Patient Stated Goal: To go back home; to see her sons OT Goal Formulation: With patient Time For Goal Achievement: 09/24/18 Potential to Achieve Goals: Good  OT Frequency: Min 3X/week   Barriers to D/C:            Co-evaluation              AM-PAC OT "6 Clicks" Daily Activity     Outcome Measure Help from another person eating meals?: None Help from another person taking care of personal grooming?: A Little Help from another person toileting, which includes using toliet, bedpan, or urinal?: A Lot Help from another person bathing (including washing, rinsing, drying)?: A Little Help from another person to put on and taking off regular upper body clothing?: A Little Help from another person to put on and taking off regular lower body clothing?: A Little 6 Click Score: 18   End of Session Equipment Utilized During Treatment: Gait belt;Rolling walker Nurse Communication: Mobility status  Activity Tolerance: Patient tolerated treatment well Patient left: in bed;with call bell/phone within reach;with bed alarm set  OT Visit Diagnosis: Unsteadiness on feet (R26.81);Muscle weakness (generalized) (M62.81);Other  symptoms and signs involving cognitive function                Time: 1545-1620 OT Time Calculation (min): 35 min Charges:  OT General Charges $OT Visit: 1 Visit OT Evaluation $OT Eval Moderate Complexity: 1 Mod OT Treatments $Self Care/Home Management : 8-22 mins  [name] DagoHilary Vern Prestia, OT/L   Acute OT Clinical Specialist Acute Rehabilitation Services Pager [phone] Office [phone]   [facility] 09/10/2018, 4:48 PM

## 2018-09-10 NOTE — Procedures (Signed)
History: 53 year old female being evaluated for altered mental status  Sedation: None  Technique: This is a 21 channel routine scalp EEG performed at the bedside with bipolar and monopolar montages arranged in accordance to the international 10/20 system of electrode placement. One channel was dedicated to EKG recording.    Background: The background consists of intermixed alpha and beta activities. There is a well defined posterior dominant rhythm of 9 Hz that attenuates with eye opening.  There is anterior shifting of the posterior dominant rhythm associated with drowsiness, but sleep is not recorded.  Photic stimulation: Physiologic driving is not performed  EEG Abnormalities: None  Clinical Interpretation: This normal EEG is recorded in the waking and drowsy state. There was no seizure or seizure predisposition recorded on this study. Please note that lack of epileptiform activity on EEG does not preclude the possibility of epilepsy.   Roland Rack, MD [facility] [phone]  If 7pm- 7am, please page neurology on call as listed in [place].

## 2018-09-10 NOTE — Progress Notes (Addendum)
Physical Therapy Treatment Patient Details Name: Elloise [name] MRN: [id number] DOB: [birth year] Today's Date: 09/10/2018    History of Present Illness Pt is a 53 y/o female admitted secondary to episode of unresponsiveness. Pt found to have anemia and also thought to have acute encephalopath. PMH includes HTN, depression, and CVA.     PT Comments    Continuing work on functional mobility and activity tolerance;  Able to walk wit rW in room today, but still quite limited by pain L and RLEs; Unsteady and unsafe with amb today, requiring max assist to prevent fall; At her current level of function and amb status, we must consider SNF for post-acute rehab to maximize independence and safety with mobility;   Bil LE pain, it seems L greater than R, is significantly effecting her mobility; Consider taking a closer look at her L knee.  Follow Up Recommendations  Supervision/Assistance - 24 hour;SNF;Other (comment)     Equipment Recommendations  Rolling walker with 5" wheels;3in1 (PT);Other (comment)(would consider wheelchair)    Recommendations for Other Services       Precautions / Restrictions Precautions Precautions: Fall    Mobility  Bed Mobility                  Transfers Overall transfer level: Needs assistance Equipment used: Rolling walker (2 wheeled) Transfers: Sit to/from Stand Sit to Stand: Min assist         General transfer comment: Cues for hand placement, as she tends to pull up on RW; Stood to RW, but bil hips, knees, and trunk significantly flexed; Able to put weight on LLE  Ambulation/Gait Ambulation/Gait assistance: Mod assist;Max assist Gait Distance (Feet): 10 Feet Assistive device: Rolling walker (2 wheeled) Gait Pattern/deviations: Decreased step length - right;Decreased step length - left;Trunk flexed     General Gait Details: Short steps with bil hips and knees flexion throughout all pahses of gait; Painful and unsteady, with one instance of  loss of balance, requiring Max assist to prevent fall   Stairs             Wheelchair Mobility    Modified Rankin (Stroke Patients Only)       Balance     Sitting balance-Leahy Scale: Good       Standing balance-Leahy Scale: Poor                              Cognition Arousal/Alertness: Awake/alert Behavior During Therapy: WFL for tasks assessed/performed Overall Cognitive Status: No family/caregiver present to determine baseline cognitive functioning                                 General Comments: Pt with poor safety awareness and required multiple safety cues during mobility tasks. Unsure of pt's baseline.       Exercises      General Comments        Pertinent Vitals/Pain Pain Assessment: Faces Faces Pain Scale: Hurts even more Pain Location: Both LEs Pain Descriptors / Indicators: Grimacing;Guarding;Discomfort;Sore Pain Intervention(s): Monitored during session;Limited activity within patient's tolerance    Home Living                      Prior Function            PT Goals (current goals can now be found in the care plan section) Acute Rehab PT  Goals Patient Stated Goal: Did not state PT Goal Formulation: With patient Time For Goal Achievement: 09/23/18 Potential to Achieve Goals: Good Progress towards PT goals: Progressing toward goals(Slowly)    Frequency    Min 3X/week      PT Plan Current plan remains appropriate    Co-evaluation              AM-PAC PT "6 Clicks" Mobility   Outcome Measure  Help needed turning from your back to your side while in a flat bed without using bedrails?: None Help needed moving from lying on your back to sitting on the side of a flat bed without using bedrails?: None Help needed moving to and from a bed to a chair (including a wheelchair)?: A Lot Help needed standing up from a chair using your arms (e.g., wheelchair or bedside chair)?: A Lot Help needed to  walk in hospital room?: A Lot Help needed climbing 3-5 steps with a railing? : Total 6 Click Score: 15    End of Session Equipment Utilized During Treatment: Gait belt Activity Tolerance: Patient limited by pain Patient left: in chair;with chair alarm set;with call bell/phone within reach Nurse Communication: Mobility status PT Visit Diagnosis: Muscle weakness (generalized) (M62.81);Difficulty in walking, not elsewhere classified (R26.2);Pain Pain - Right/Left: Left Pain - part of body: Leg     Time: [id number] PT Time Calculation (min) (ACUTE ONLY): 16 min  Charges:  $Gait Training: 8-22 mins                     Van ClinesHolly Larrell Rapozo, PT  Acute Rehabilitation Services Pager [phone] Office [phone]    Levi AlandHolly H Tiffnay Bossi 09/10/2018, 2:36 PM

## 2018-09-10 NOTE — TOC Initial Note (Addendum)
Transition of Care (TOC) - Initial/Assessment Note  **Patient provided ss# [id number]  Patient Details  Name: [name] [name] MRN: [id number] Date of Birth: 1966/01/18  Transition of Care [facility](TOC) CM/SW Contact:    Cristobal Goldmannrawford, Kinley Ferrentino Bradley, LCSW Phone Number: 09/10/2018, 1:59 PM  Clinical Narrative: CSW talked with patient at the bedside regarding her current living situation. Ms. [name] [name] was sitting up in a chair at bedside and was alert, oriented, pleasant and agreeable to talking with CSW. Ms. [name] [name] confirmed that she is from [facility] and the director is [name] [name]. Patient reported that she has been there since May 2020 and the address shown on CSW's face sheet is the facility address. Patient plans to return to this facility at discharge.  When asked, patient reported that she has 3 sons and [name] [name], or [name] [name] as she calls him is the oldest. She indicated that [name] [name] is in school in [place] and her other 2 sons live there as well.                    Expected Discharge Plan: Group Home(Group Home versus SNF, depending on progress) Barriers to Discharge: Continued Medical Work up   Patient Goals and CMS Choice Patient states their goals for this hospitalization and ongoing recovery are:: Patient intends to return to group home at discharge CMS Medicare.gov Compare Post Acute Care list provided to:: Other (Comment Required)(Not provided at initial visit on 7/21) Choice offered to / list presented to : NA(List not given at initial visit)  Expected Discharge Plan and Services Expected Discharge Plan: Group Home(Group Home versus SNF, depending on progress) In-house Referral: Clinical Social Work(Referral not recieved, however CSW following based on patient being from a group home) Discharge Planning Services: Other - See comment(Patient will receive assistant with transportation once medically stable for discharge)   Living arrangements for the past 2 months: Group Home([facility])                                     Prior Living Arrangements/Services Living arrangements for the past 2 months: Group Home([facility]) Lives with:: Facility Resident(Group Home) Patient language and need for interpreter reviewed:: No Do you feel safe going back to the place where you live?: Yes(Patient did not express any concerns regarding returning to the group home)      Need for Family Participation in Patient Care: Yes (Comment) Care giver support system in place?: Yes (comment)   Criminal Activity/Legal Involvement Pertinent to Current Situation/Hospitalization: No - Comment as needed  Activities of Daily Living Home Assistive Devices/Equipment: Environmental consultantWalker (specify type), Wheelchair ADL Screening (condition at time of admission) Patient's cognitive ability adequate to safely complete daily activities?: No Is the patient deaf or have difficulty hearing?: No Does the patient have difficulty seeing, even when wearing glasses/contacts?: No Does the patient have difficulty concentrating, remembering, or making decisions?: Yes Patient able to express need for assistance with ADLs?: Yes Does the patient have difficulty dressing or bathing?: No Independently performs ADLs?: Yes (appropriate for developmental age) Does the patient have difficulty walking or climbing stairs?: Yes Weakness of Legs: Both Weakness of Arms/Hands: None  Permission Sought/Granted   Permission granted to share information with : Yes, Verbal Permission Granted  Share Information with NAME: [name] [name] - son  Permission granted to share info w AGENCY: Yes - [name] [name] - manager at group  home  Permission granted to share info w Relationship: Son  Permission granted to share info w Contact Information: [name] [name] - [phone] and [name] [name] [phone]  Emotional Assessment Appearance:: Appears stated age Attitude/Demeanor/Rapport: Other (comment)(Appropriate) Affect  (typically observed): Appropriate Orientation: : Oriented to Self, Oriented to Place, Oriented to  Time, Oriented to Situation Alcohol / Substance Use: Other (comment)(Not on file) Psych Involvement: Yes (comment)(Psych eval conducted and psychiatrist signed off)  Admission diagnosis:  LOC (loss of consciousness) ([place]) [R40.20] Gastrointestinal hemorrhage, unspecified gastrointestinal hemorrhage type [K92.2] Patient Active Problem List   Diagnosis Date Noted  . Altered mental status   . Loss of consciousness ([place]) 09/08/2018  . Encephalopathy 09/08/2018   PCP:  Patient, No Pcp Per Pharmacy:  No Pharmacies Listed    Social Determinants of Health (SDOH) Interventions  No SDOH interventions needed at this time.  Readmission Risk Interventions No flowsheet data found.

## 2018-09-11 ENCOUNTER — Encounter (HOSPITAL_COMMUNITY): Payer: Self-pay | Admitting: *Deleted

## 2018-09-11 DIAGNOSIS — R4020 Unspecified coma: Secondary | ICD-10-CM

## 2018-09-11 LAB — CBC
HCT: 26.8 % — ABNORMAL LOW (ref 36.0–46.0)
Hemoglobin: 7.9 g/dL — ABNORMAL LOW (ref 12.0–15.0)
MCH: 20.8 pg — ABNORMAL LOW (ref 26.0–34.0)
MCHC: 29.5 g/dL — ABNORMAL LOW (ref 30.0–36.0)
MCV: 70.5 fL — ABNORMAL LOW (ref 80.0–100.0)
Platelets: 191 K/uL (ref 150–400)
RBC: 3.8 MIL/uL — ABNORMAL LOW (ref 3.87–5.11)
RDW: 20.2 % — ABNORMAL HIGH (ref 11.5–15.5)
WBC: 7.9 K/uL (ref 4.0–10.5)
nRBC: 0 % (ref 0.0–0.2)

## 2018-09-11 LAB — GLUCOSE, CAPILLARY
Glucose-Capillary: 115 mg/dL — ABNORMAL HIGH (ref 70–99)
Glucose-Capillary: 123 mg/dL — ABNORMAL HIGH (ref 70–99)
Glucose-Capillary: 135 mg/dL — ABNORMAL HIGH (ref 70–99)
Glucose-Capillary: 115 mg/dL — ABNORMAL HIGH (ref 70–99)

## 2018-09-11 LAB — BASIC METABOLIC PANEL WITH GFR
Anion gap: 7 (ref 5–15)
BUN: 14 mg/dL (ref 6–20)
CO2: 23 mmol/L (ref 22–32)
Calcium: 8.9 mg/dL (ref 8.9–10.3)
Chloride: 107 mmol/L (ref 98–111)
Creatinine, Ser: 0.79 mg/dL (ref 0.44–1.00)
GFR calc Af Amer: >60 mL/min (ref >60)
GFR calc non Af Amer: >60 mL/min (ref >60)
Glucose, Bld: 119 mg/dL — ABNORMAL HIGH (ref 70–99)
Potassium: 4 mmol/L (ref 3.5–5.1)
Sodium: 137 mmol/L (ref 135–145)

## 2018-09-11 LAB — MAGNESIUM: Magnesium: 1.8 mg/dL (ref 1.7–2.4)

## 2018-09-11 MED ORDER — TRAMADOL HCL 50 MG PO TABS
50.0000 mg | ORAL_TABLET | Freq: Two times a day (BID) | ORAL | Status: DC | PRN
  Administered 2018-09-11 – 2018-09-13 (×4): 50 mg via ORAL
  Filled 2018-09-11 (×5): qty 1

## 2018-09-11 NOTE — Progress Notes (Signed)
Physical Therapy Treatment Patient Details Name: [name] [name] MRN: [id number] DOB: [birth year] Today's Date: 09/11/2018    History of Present Illness Pt is a 53 y/o female admitted secondary to episode of unresponsiveness. Pt found to have anemia and also thought to have acute encephalopath. PMH includes HTN, depression, and CVA. L knee pain limiting amb while here, and x-ray is showing osteoarthritis    PT Comments    Continuing work on functional mobility and activity tolerance;  Improved walking distance and no loss of balance this session with use of RW; Hips/knees/trunk flexed posture during amb likely baseline;   Appreciate taking a closer look at her painful knees; Noted L knee with osteoarthritis -- I wonder if her PCP is considering Ortho referral (?TKA in her future);  At this point, I favor dc back to her familiar home environment, routine, and caregivers.     Follow Up Recommendations  Home health PT;Supervision/Assistance - 24 hour     Equipment Recommendations  None recommended by PT    Recommendations for Other Services OT consult     Precautions / Restrictions Precautions Precautions: Fall Restrictions Weight Bearing Restrictions: No    Mobility  Bed Mobility Overal bed mobility: Modified Independent             General bed mobility comments: Pt received in chair upon arrival.  Transfers Overall transfer level: Needs assistance Equipment used: Rolling walker (2 wheeled) Transfers: Sit to/from Stand Sit to Stand: Min guard         General transfer comment: Heavy dependence on UEs pushing off armrests to rise, then stpes her feet backwards to get feet (base of support) under her center of mass; then she moves her hands to the RW  Ambulation/Gait Ambulation/Gait assistance: Min guard Gait Distance (Feet): 25 Feet Assistive device: Rolling walker (2 wheeled) Gait Pattern/deviations: Decreased step length - right;Decreased step length -  left;Trunk flexed     General Gait Details: Short steps with bil hips and knees flexion throughout all pahses of gait; Cues to be slow and controlled, and no loss of balance this session   Stairs             Wheelchair Mobility    Modified Rankin (Stroke Patients Only)       Balance Overall balance assessment: Needs assistance Sitting-balance support: No upper extremity supported;Feet supported Sitting balance-Leahy Scale: Good     Standing balance support: Bilateral upper extremity supported;During functional activity Standing balance-Leahy Scale: Poor Standing balance comment: Reliant on BUE and external support                             Cognition Arousal/Alertness: Awake/alert Behavior During Therapy: WFL for tasks assessed/performed Overall Cognitive Status: No family/caregiver present to determine baseline cognitive functioning                                 General Comments: poor safety awareness, but likely close to baseline      Exercises      General Comments        Pertinent Vitals/Pain Pain Assessment: Faces Faces Pain Scale: Hurts whole lot Pain Location: Knees Pain Descriptors / Indicators: Grimacing;Guarding;Discomfort;Sore Pain Intervention(s): Monitored during session    Home Living                      Prior Function  PT Goals (current goals can now be found in the care plan section) Acute Rehab PT Goals Patient Stated Goal: Did not specifically state, but did express need to go to the bathroom PT Goal Formulation: With patient Time For Goal Achievement: 09/23/18 Potential to Achieve Goals: Good Progress towards PT goals: Progressing toward goals    Frequency    Min 3X/week      PT Plan Discharge plan needs to be updated    Co-evaluation              AM-PAC PT "6 Clicks" Mobility   Outcome Measure  Help needed turning from your back to your side while in a flat bed  without using bedrails?: None Help needed moving from lying on your back to sitting on the side of a flat bed without using bedrails?: None Help needed moving to and from a bed to a chair (including a wheelchair)?: A Little Help needed standing up from a chair using your arms (e.g., wheelchair or bedside chair)?: A Little Help needed to walk in hospital room?: A Little Help needed climbing 3-5 steps with a railing? : A Lot 6 Click Score: 19    End of Session Equipment Utilized During Treatment: Gait belt Activity Tolerance: Patient tolerated treatment well;Patient limited by pain Patient left: in chair;with chair alarm set;with call bell/phone within reach Nurse Communication: Mobility status PT Visit Diagnosis: Muscle weakness (generalized) (M62.81);Difficulty in walking, not elsewhere classified (R26.2);Pain Pain - Right/Left: Left Pain - part of body: Leg     Time: 1205-1226 PT Time Calculation (min) (ACUTE ONLY): 21 min  Charges:  $Gait Training: 8-22 mins                     Van ClinesHolly Dequarius Jeffries, PT  Acute Rehabilitation Services Pager [phone] Office [phone]    Levi AlandHolly H Tifanie Gardiner 09/11/2018, 1:19 PM

## 2018-09-11 NOTE — Progress Notes (Signed)
Patient ID: [name] [name], female   DOB: 03/18/1965, 53 y.o.   MRN: [id number]  PROGRESS NOTE    [name] PerchesJennifer [name]  FAO:[id number] DOB: 03/18/1965 DOA: 09/07/2018 PCP: Patient, No Pcp Per   Brief Narrative:  53 year old female with history of CVA, depression, hypertension and incontinence presented with unresponsiveness and was initially admitted to ICU.  Apparently patient was found unresponsive and CPR was started by group home.  When EMS arrived, she had peripheral pulses.  CT of the head was negative for acute abnormality.  No large vessel occlusion on CTA noted.  Neurology consulted.  She was found to have hemoglobin of 6.9 at outside ED with positive Hemoccult and was given 1 unit of blood.  [facility]  Psychiatry was also consulted.  Assessment & Plan: Acute encephalopathy: Metabolic versus toxic -Unsure etiology -CT of the head was negative for acute abnormality.  No large vessel occlusion on CTA of the brain. -Neurology was consulted.  MRI of the brain was a very poor quality because the patient moving a lot but showed no evidence of acute intracranial abnormality -EEG on 09/09/2018 did not show any seizure-like activity.  Neurology available as needed. -Psychiatry evaluation appreciated: Psychiatry has signed off. -Fall precautions.  Continue neurochecks. -Diet as per SLP recommendations. -B12, folate and ammonia levels normal.  Anemia: Unsure if it is acute or chronic -Presented with hemoglobin of 6.9.  No prior hemoglobin in the system -Transfused 1 unit of packed red cells on 09/08/2018  -FOBT was positive.  No overt melena or hematochezia -DC'd aspirin 81 mg twice a day. -Continue Protonix 40 mg daily -We will consider consulting GI tomorrow pending repeat hemoglobin  Left knee osteoarthritis Left knee x-ray showed severe osteoarthritis, with moderate-sized joint effusion We will consider consulting orthopedics for possible  follow-up  Hypertension -Monitor blood pressure.  Continue amlodipine and lisinopril  Prediabetes/question of diabetes  -Blood sugars stable.  Hemoglobin A1c 5.8.  Continue CBG with SSI.  Outpatient follow-up.  Patient apparently was on metformin per med rec.  Generalized deconditioning -Patient lives in a group home -PT recommended home health PT      DVT prophylaxis: SCDs Code Status: Full Family Communication: Spoke to patient at bedside. Disposition Plan: Likely back to group home on 09/12/2018  Consultants: PCCM/neurology/psychiatry  Procedures: None  Antimicrobials: None   Subjective: Patient denies any new complaints, left knee markedly swollen and tender.  Denies any chest pain, shortness of breath, fever/chills, abdominal pain, nausea/vomiting   Objective: Vitals:   09/11/18 0455 09/11/18 0500 09/11/18 0913 09/11/18 1619  BP: 120/78  119/77 118/78  Pulse: 78  79 81  Resp: 18  18 18   Temp: 98.5 F (36.9 C)  97.8 F (36.6 C) 99.2 F (37.3 C)  TempSrc: Oral  Oral Oral  SpO2: 100%  100% 100%  Weight:  80.1 kg    Height:        Intake/Output Summary (Last 24 hours) at 09/11/2018 1731 Last data filed at 09/11/2018 1229 Gross per 24 hour  Intake 1080 ml  Output 400 ml  Net 680 ml   Filed Weights   09/10/18 0335 09/10/18 2046 09/11/18 0500  Weight: 80.3 kg 80.1 kg 80.1 kg    Examination:  General: NAD   Cardiovascular: S1, S2 present  Respiratory:  Bilateral decreased breath sounds at the bases  Abdomen: Soft, nontender, nondistended, bowel sounds present  Musculoskeletal: No bilateral pedal edema noted.  Left knee markedly swollen and  tender  Skin: Normal  Psychiatry:  Flat affect   Data Reviewed: I have personally reviewed following labs and imaging studies  CBC: Recent Labs  Lab 09/07/18 2059 09/07/18 2103 09/08/18 0612 09/09/18 0625 09/10/18 0049 09/11/18 0630  WBC 7.7  --   --  11.7* 10.3 7.9  NEUTROABS 4.1  --   --   --   --    --   HGB 6.9* 8.5* 9.2* 8.8* 7.9* 7.9*  HCT 24.7* 25.0* 27.0* 30.4* 26.2* 26.8*  MCV 69.4*  --   --  71.4* 70.2* 70.5*  PLT 233  --   --  182 194 191   Basic Metabolic Panel: Recent Labs  Lab 09/07/18 2059 09/07/18 2103 09/08/18 0532 09/08/18 0612 09/09/18 0625 09/10/18 0049 09/11/18 0630  NA 140 140 138 141 139 136 137  K 3.8 3.8 3.6 3.7 3.8 3.7 4.0  CL 105 105 103  --  105 103 107  CO2 23  --  24  --  24 22 23   GLUCOSE 217* 214* 175*  --  92 135* 119*  BUN 30* 27* 19  --  15 18 14   CREATININE 0.94 1.00 0.94  --  0.87 0.89 0.79  CALCIUM 9.3  --  9.1  --  9.0 8.8* 8.9  MG  --   --  1.4*  --  1.6* 1.9 1.8  PHOS  --   --  3.2  --  3.8 4.4  --    GFR: Estimated Creatinine Clearance: 91.4 mL/min (by C-G formula based on SCr of 0.79 mg/dL). Liver Function Tests: Recent Labs  Lab 09/07/18 2059 09/10/18 0049  AST 23 22  ALT 18 16  ALKPHOS 57 53  BILITOT 0.4 0.7  PROT 7.8 7.0  ALBUMIN 3.9 3.2*   No results for input(s): LIPASE, AMYLASE in the last 168 hours. Recent Labs  Lab 09/08/18 0532 09/10/18 0049  AMMONIA 16 18   Coagulation Profile: Recent Labs  Lab 09/07/18 2059  INR 1.0   Cardiac Enzymes: No results for input(s): CKTOTAL, CKMB, CKMBINDEX, TROPONINI in the last 168 hours. BNP (last 3 results) No results for input(s): PROBNP in the last 8760 hours. HbA1C: No results for input(s): HGBA1C in the last 72 hours. CBG: Recent Labs  Lab 09/10/18 1607 09/10/18 2047 09/11/18 0659 09/11/18 1128 09/11/18 1619  GLUCAP 111* 193* 115* 123* 135*   Lipid Profile: No results for input(s): CHOL, HDL, LDLCALC, TRIG, CHOLHDL, LDLDIRECT in the last 72 hours. Thyroid Function Tests: Recent Labs    09/09/18 0625  TSH 3.141   Anemia Panel: Recent Labs    09/10/18 0049  VITAMINB12 389  FOLATE 6.5   Sepsis Labs: Recent Labs  Lab 09/07/18 2116 09/07/18 2357  LATICACIDVEN 2.8* 1.7    Recent Results (from the past 240 hour(s))  Urine culture     Status:  None   Collection Time: 09/07/18  8:59 PM   Specimen: Urine, Random  Result Value Ref Range Status   Specimen Description   Final    URINE, RANDOM Performed at [facility], [address] [place], [place] [address]    Special Requests   Final    NONE Performed at [facility], [address] [place], [place] [address]    Culture   Final    NO GROWTH Performed at [facility], [address] [place], [place] [address]    Report Status 09/09/2018 FINAL  Final  SARS Coronavirus 2 (CEPHEID - Performed in  [facility] hospital lab), Hosp Order     Status: None   Collection Time: 09/07/18  9:15 PM   Specimen: Nasopharyngeal Swab  Result Value Ref Range Status   SARS Coronavirus 2 NEGATIVE NEGATIVE Final    Comment: (NOTE) If result is NEGATIVE SARS-CoV-2 target nucleic acids are NOT DETECTED. The SARS-CoV-2 RNA is generally detectable in upper and lower  respiratory specimens during the acute phase of infection. The lowest  concentration of SARS-CoV-2 viral copies this assay can detect is 250  copies / mL. A negative result does not preclude SARS-CoV-2 infection  and should not be used as the sole basis for treatment or other  patient management decisions.  A negative result may occur with  improper specimen collection / handling, submission of specimen other  than nasopharyngeal swab, presence of viral mutation(s) within the  areas targeted by this assay, and inadequate number of viral copies  (<250 copies / mL). A negative result must be combined with clinical  observations, patient history, and epidemiological information. If result is POSITIVE SARS-CoV-2 target nucleic acids are DETECTED. The SARS-CoV-2 RNA is generally detectable in upper and lower  respiratory specimens dur ing the acute phase of infection.  Positive  results are indicative of active infection with SARS-CoV-2.  Clinical  correlation with patient history and other diagnostic information is  necessary  to determine patient infection status.  Positive results do  not rule out bacterial infection or co-infection with other viruses. If result is PRESUMPTIVE POSTIVE SARS-CoV-2 nucleic acids MAY BE PRESENT.   A presumptive positive result was obtained on the submitted specimen  and confirmed on repeat testing.  While 2019 novel coronavirus  (SARS-CoV-2) nucleic acids may be present in the submitted sample  additional confirmatory testing may be necessary for epidemiological  and / or clinical management purposes  to differentiate between  SARS-CoV-2 and other Sarbecovirus currently known to infect humans.  If clinically indicated additional testing with an alternate test  methodology [id number]) is advised. The SARS-CoV-2 RNA is generally  detectable in upper and lower respiratory sp ecimens during the acute  phase of infection. The expected result is Negative. Fact Sheet for Patients:  StrictlyIdeas.no Fact Sheet for Healthcare Providers: BankingDealers.co.za This test is not yet approved or cleared by the [place] FDA and has been authorized for detection and/or diagnosis of SARS-CoV-2 by FDA under an Emergency Use Authorization (EUA).  This EUA will remain in effect (meaning this test can be used) for the duration of the COVID-19 declaration under Section 564(b)(1) of the Act, 21 U.S.C. section 360bbb-3(b)(1), unless the authorization is terminated or revoked sooner. Performed at [facility], [address] [place], [place] [address]   MRSA PCR Screening     Status: None   Collection Time: 09/08/18  4:05 AM   Specimen: Nasal Mucosa; Nasopharyngeal  Result Value Ref Range Status   MRSA by PCR NEGATIVE NEGATIVE Final    Comment:        The GeneXpert MRSA Assay (FDA approved for NASAL specimens only), is one component of a comprehensive MRSA colonization surveillance program. It is not intended to diagnose MRSA infection nor to  guide or monitor treatment for MRSA infections. Performed at [facility], [place] [address] [place], [place] [address]          Radiology Studies: Dg Knee 1-2 Views Left  Result Date: 09/10/2018 CLINICAL DATA:  Chronic 1 year history of BILATERAL knee pain, LEFT greater than RIGHT. Prior RIGHT knee  arthroplasty. No known injuries. EXAM: LEFT KNEE - 1-2 VIEW COMPARISON:  None. FINDINGS: No evidence of acute, subacute or healed fractures. Complete loss of the MEDIAL compartment joint space with mild narrowing of the MEDIAL tibial plateau and associated spurring. Moderate narrowing of the LATERAL compartment joint space. Mild narrowing of the patellofemoral compartment joint space with spurring along the undersurface of the patella. Osseous demineralization. Calcified loose bodies in the joint. Moderate-sized joint effusion. IMPRESSION: 1. Severe osteoarthritis involving the MEDIAL compartment, moderate osteoarthritis involving the LATERAL compartment and mild osteoarthritis involving the patellofemoral compartment. 2. Moderate-sized joint effusion. 3. Calcified loose bodies in the joint. 4. Osseous demineralization. Electronically Signed   By: Hulan Saashomas  Lawrence M.D.   On: 09/10/2018 17:10   Dg Knee 1-2 Views Right  Result Date: 09/10/2018 CLINICAL DATA:  Chronic 1 year history of BILATERAL knee pain, LEFT greater than RIGHT. Prior RIGHT knee arthroplasty. No known injuries. EXAM: RIGHT KNEE - 1-2 VIEW COMPARISON:  10/03/2017, 08/04/2015 and earlier. FINDINGS: RIGHT total knee arthroplasty with anatomic alignment. No complicating features. No evidence of acute, subacute or healed fractures. No visible joint effusion. IMPRESSION: RIGHT total knee arthroplasty with anatomic alignment and no complicating features. Electronically Signed   By: Hulan Saashomas  Lawrence M.D.   On: 09/10/2018 17:11        Scheduled Meds: . amLODipine  5 mg Oral q morning - 10a  . atorvastatin  40 mg Oral QPM  .  Chlorhexidine Gluconate Cloth  6 each Topical Q0600  . insulin aspart  0-15 Units Subcutaneous TID WC  . lisinopril  20 mg Oral q morning - 10a  . pantoprazole  40 mg Oral Daily   Continuous Infusions:    LOS: 3 days        Briant CedarNkeiruka J Takiera Mayo, MD [facility] 09/11/2018, 5:31 PM

## 2018-09-11 NOTE — Progress Notes (Signed)
Physical Therapy Note  Discussed [name] [name]'s status with OT, and noted she did perform better in the afternoon with OT; Walked in room, participated in ADLs; no losses of balance with OT;   It sounds like [name] [name] is close to her baseline, and with that in mind, I favor going back to her familiar home environment, routine, and caregivers;   Would like HPT/OT follow up;   Continuing to follow,   Roney Marion, [place] Pager [phone] Office [phone]

## 2018-09-11 NOTE — TOC Progression Note (Signed)
Transition of Care [facility]) - Progression Note    Patient Details  Name: Sabrinia Prien MRN: [id number] Date of Birth: Oct 03, 1965  [facility] CM/SW Contact  Sharlet Salina Mila Homer, LCSW Phone Number: 09/11/2018, 5:25 PM  Clinical Narrative:  CSW talked with [name] [phone]) with [facility].  Ms. Jeanell Sparrow reported that patient has been with her since 2017 and has a guardian - [place], [place] - [phone]. When asked, Ms. Ray indicated that they can come to get patient when she is ready for discharge. Ms. Jeanell Sparrow advised CSW that she will contact the guardian once patient ready for discharge.      Expected Discharge Plan: Group Home(Group Home versus SNF, depending on progress) Barriers to Discharge: Continued Medical Work up  Expected Discharge Plan and Services Expected Discharge Plan: Group Home(Group Home versus SNF, depending on progress) In-house Referral: Clinical Social Work(Referral not recieved, however CSW following based on patient being from a group home) Discharge Planning Services: Other - See comment(Patient will receive assistant with transportation once medically stable for discharge)   Living arrangements for the past 2 months: Group Home([facility])                                       Social Determinants of Health (SDOH) Interventions  No SDOH interventions needed at this time.  Readmission Risk Interventions No flowsheet data found.

## 2018-09-11 NOTE — Plan of Care (Signed)
  Problem: Education: Goal: Knowledge of General Education information will improve Description: Including pain rating scale, medication(s)/side effects and non-pharmacologic comfort measures Outcome: Progressing   Problem: Pain Managment: Goal: General experience of comfort will improve Outcome: Progressing   Problem: Skin Integrity: Goal: Risk for impaired skin integrity will decrease Outcome: Progressing   

## 2018-09-11 NOTE — Progress Notes (Signed)
Occupational Therapy Treatment Patient Details Name: Asal Teas MRN: [id number] DOB: 1965/05/27 Today's Date: 09/11/2018    History of present illness Pt is a 53 y/o female admitted secondary to episode of unresponsiveness. Pt found to have anemia and also thought to have acute encephalopath. PMH includes HTN, depression, and CVA.    OT comments  Pt tolerated session well with complaints of RLE pain. Upon arrival pt received with incontinence. Session addressed functional transfers to sink with bathing on BSC. Supervision to Min guard required for safety and set for up ADL items. VCs required for safety awareness due to baseline of cognition. DC and freq remains the same. OT will continue to follow acutely.    Follow Up Recommendations  Home health OT;Supervision/Assistance - 24 hour    Equipment Recommendations  None recommended by OT    Recommendations for Other Services      Precautions / Restrictions Precautions Precautions: Fall Restrictions Weight Bearing Restrictions: No       Mobility Bed Mobility Overal bed mobility: Modified Independent             General bed mobility comments: Pt received in chair upon arrival.  Transfers Overall transfer level: Needs assistance Equipment used: Rolling walker (2 wheeled) Transfers: Sit to/from Stand Sit to Stand: Min guard         General transfer comment: Pt demonstrates poor safety awareness during transfers with "furniture walking" and requiring several VCs for redirection towards RW. Pt is at baseline cognitively, however, with subtle reminders pt able to perform tasks with no difficulties.     Balance Overall balance assessment: Needs assistance Sitting-balance support: No upper extremity supported;Feet supported Sitting balance-Leahy Scale: Good     Standing balance support: Bilateral upper extremity supported;During functional activity Standing balance-Leahy Scale: Poor Standing balance comment:  Reliant on BUE and external support                            ADL either performed or assessed with clinical judgement   ADL Overall ADL's : Needs assistance/impaired         Upper Body Bathing: Set up;Sitting;Supervision/ safety   Lower Body Bathing: Min guard;Sit to/from stand Lower Body Bathing Details (indicate cue type and reason): required min guard for safety.  Upper Body Dressing : Set up;Sitting   Lower Body Dressing: Sit to/from stand;Minimal assistance Lower Body Dressing Details (indicate cue type and reason): Required assist to don L sock while seated on BSC at the sink.             Functional mobility during ADLs: Min guard;Rolling walker;Cueing for safety General ADL Comments: Upon arrival pt observed to urinate on herself. OT instructed pt to engage in bathing at the sink. Min guard functional transfer from chair to sink. Set up to min guard for bathing. Min A for LB dressing of sock. Pt educated on the importance of asking for help. Able to state during 3 trials throughout session for memory.      Vision       Perception     Praxis      Cognition Arousal/Alertness: Awake/alert Behavior During Therapy: WFL for tasks assessed/performed Overall Cognitive Status: No family/caregiver present to determine baseline cognitive functioning                                 General Comments: poor safety awareness but most  likley close to baseline. Pt able to give information regarding her sons, but did not know details. Rowan BlaseLawanda confirmed that this is normal. When asked if tasks were more difficult, pt stated she felt she was about "normal" but that she usually "does it a different way"        Exercises     Shoulder Instructions       General Comments      Pertinent Vitals/ Pain       Pain Assessment: Faces Faces Pain Scale: Hurts little more Pain Location: R knee Pain Descriptors / Indicators:  Grimacing;Guarding;Discomfort;Sore Pain Intervention(s): Monitored during session;Repositioned  Home Living                                          Prior Functioning/Environment              Frequency  Min 3X/week        Progress Toward Goals  OT Goals(current goals can now be found in the care plan section)  Progress towards OT goals: Progressing toward goals  Acute Rehab OT Goals Patient Stated Goal: To go back home; to see her sons OT Goal Formulation: With patient Time For Goal Achievement: 09/24/18 Potential to Achieve Goals: Good ADL Goals Pt Will Perform Lower Body Bathing: with supervision;sit to/from stand Pt Will Perform Lower Body Dressing: with supervision;sit to/from stand Pt Will Transfer to Toilet: with modified independence;ambulating;bedside commode  Plan Discharge plan remains appropriate;Frequency remains appropriate    Co-evaluation                 AM-PAC OT "6 Clicks" Daily Activity     Outcome Measure   Help from another person eating meals?: None Help from another person taking care of personal grooming?: A Little Help from another person toileting, which includes using toliet, bedpan, or urinal?: A Lot Help from another person bathing (including washing, rinsing, drying)?: A Little Help from another person to put on and taking off regular upper body clothing?: A Little Help from another person to put on and taking off regular lower body clothing?: A Little 6 Click Score: 18    End of Session Equipment Utilized During Treatment: Gait belt;Rolling walker  OT Visit Diagnosis: Unsteadiness on feet (R26.81);Muscle weakness (generalized) (M62.81);Other symptoms and signs involving cognitive function   Activity Tolerance Patient tolerated treatment well   Patient Left with call bell/phone within reach;in chair;with chair alarm set   Nurse Communication Mobility status        Time: 0935-1000 OT Time Calculation  (min): 25 min  Charges: OT General Charges $OT Visit: 1 Visit OT Treatments $Self Care/Home Management : 23-37 mins  Marquette OldEvan Geovanny Sartin, MSOT, OTR/L  Supplemental Rehabilitation Services  [phone]   Zigmund Danielvan M Maurie Olesen 09/11/2018, 12:10 PM

## 2018-09-12 LAB — BASIC METABOLIC PANEL
Anion gap: 9 (ref 5–15)
BUN: 18 mg/dL (ref 6–20)
CO2: 23 mmol/L (ref 22–32)
Calcium: 8.9 mg/dL (ref 8.9–10.3)
Chloride: 104 mmol/L (ref 98–111)
Creatinine, Ser: 0.92 mg/dL (ref 0.44–1.00)
GFR calc Af Amer: >60 mL/min (ref >60)
GFR calc non Af Amer: >60 mL/min (ref >60)
Glucose, Bld: 141 mg/dL — ABNORMAL HIGH (ref 70–99)
Potassium: 4.7 mmol/L (ref 3.5–5.1)
Sodium: 136 mmol/L (ref 135–145)

## 2018-09-12 LAB — GLUCOSE, CAPILLARY
Glucose-Capillary: 145 mg/dL — ABNORMAL HIGH (ref 70–99)
Glucose-Capillary: 95 mg/dL (ref 70–99)
Glucose-Capillary: 196 mg/dL — ABNORMAL HIGH (ref 70–99)
Glucose-Capillary: 334 mg/dL — ABNORMAL HIGH (ref 70–99)

## 2018-09-12 LAB — CBC
HCT: 27.2 % — ABNORMAL LOW (ref 36.0–46.0)
Hemoglobin: 7.9 g/dL — ABNORMAL LOW (ref 12.0–15.0)
MCH: 21 pg — ABNORMAL LOW (ref 26.0–34.0)
MCHC: 29 g/dL — ABNORMAL LOW (ref 30.0–36.0)
MCV: 72.1 fL — ABNORMAL LOW (ref 80.0–100.0)
Platelets: 217 10*3/uL (ref 150–400)
RBC: 3.77 MIL/uL — ABNORMAL LOW (ref 3.87–5.11)
RDW: 20.4 % — ABNORMAL HIGH (ref 11.5–15.5)
WBC: 9.1 10*3/uL (ref 4.0–10.5)
nRBC: 0 % (ref 0.0–0.2)

## 2018-09-12 MED ORDER — SODIUM CHLORIDE 0.9 % IV SOLN: 510.0000 mg | Freq: Once | INTRAVENOUS | Status: DC

## 2018-09-12 MED ORDER — FERROUS SULFATE 325 (65 FE) MG PO TABS
325.0000 mg | ORAL_TABLET | Freq: Two times a day (BID) | ORAL | Status: DC
  Administered 2018-09-12 – 2018-09-13 (×3): 325 mg via ORAL
  Filled 2018-09-12 (×3): qty 1

## 2018-09-12 MED ORDER — BUPIVACAINE HCL (PF) 0.5 % IJ SOLN
10.0000 mL | Freq: Once | INTRAMUSCULAR | Status: DC
  Filled 2018-09-12: qty 10

## 2018-09-12 MED ORDER — METHYLPREDNISOLONE ACETATE 80 MG/ML IJ SUSP
80.0000 mg | Freq: Once | INTRAMUSCULAR | Status: DC
  Filled 2018-09-12: qty 1

## 2018-09-12 NOTE — Plan of Care (Signed)
  Problem: Education: Goal: Knowledge of General Education information will improve Description: Including pain rating scale, medication(s)/side effects and non-pharmacologic comfort measures Outcome: Progressing   Problem: Activity: Goal: Risk for activity intolerance will decrease Outcome: Progressing   

## 2018-09-12 NOTE — Procedures (Signed)
Procedure: Left knee injection  Indication: Left knee OA  Surgeon: Silvestre Gunner, PA-C  Assist: None  Anesthesia: None  EBL: None  Complications: None  Findings: After risks/benefits explained patient desires to undergo procedure. Consent obtained. The left knee was sterilely prepped 34ml 0.5% Marcaine and 80mg  depomedrol were instilled. Pt tolerated the procedure well.    Lisette Abu, PA-C Orthopedic Surgery [phone]

## 2018-09-12 NOTE — Plan of Care (Signed)
  Problem: Clinical Measurements: Goal: Ability to maintain clinical measurements within normal limits will improve Outcome: Completed/Met Goal: Will remain free from infection Outcome: Completed/Met Goal: Diagnostic test results will improve Outcome: Completed/Met Goal: Respiratory complications will improve Outcome: Completed/Met Goal: Cardiovascular complication will be avoided Outcome: Completed/Met   Problem: Elimination: Goal: Will not experience complications related to urinary retention Outcome: Completed/Met   Problem: Skin Integrity: Goal: Risk for impaired skin integrity will decrease Outcome: Completed/Met

## 2018-09-12 NOTE — Consult Note (Signed)
Reason for Consult:Left knee pain Referring Physician: Kendal Hymen  [name] [name] is an 53 y.o. female.  HPI: [place] was admitted 4d ago with encephalopathy and anemia. She has been c/o persistent left knee pain that's severe and orthopedic surgery was consulted. She tells me the pain has been pretty constant over the last 2 months and is worse with walking. She says it feels like her other knee did before it was replaced. X-rays showed significant OA.  Past Medical History:  Diagnosis Date  . CVA (cerebral vascular accident) ([place])   . Depression   . Hypertension   . Incontinence     History reviewed. No pertinent surgical history.  History reviewed. No pertinent family history.  Social History:  reports that she has quit smoking. She has never used smokeless tobacco. No history on file for alcohol and drug.  Allergies: No Known Allergies  Medications: I have reviewed the patient's current medications.  Results for orders placed or performed during the hospital encounter of 09/07/18 (from the past 48 hour(s))  Glucose, capillary     Status: Abnormal   Collection Time: 09/10/18  4:07 PM  Result Value Ref Range   Glucose-Capillary 111 (H) 70 - 99 mg/dL  Glucose, capillary     Status: Abnormal   Collection Time: 09/10/18  8:47 PM  Result Value Ref Range   Glucose-Capillary 193 (H) 70 - 99 mg/dL  CBC     Status: Abnormal   Collection Time: 09/11/18  6:30 AM  Result Value Ref Range   WBC 7.9 4.0 - 10.5 K/uL   RBC 3.80 (L) 3.87 - 5.11 MIL/uL   Hemoglobin 7.9 (L) 12.0 - 15.0 g/dL    Comment: Reticulocyte Hemoglobin testing may be clinically indicated, consider ordering this additional test [id number]    HCT 26.8 (L) 36.0 - 46.0 %   MCV 70.5 (L) 80.0 - 100.0 fL   MCH 20.8 (L) 26.0 - 34.0 pg   MCHC 29.5 (L) 30.0 - 36.0 g/dL   RDW 20.2 (H) 11.5 - 15.5 %   Platelets 191 150 - 400 K/uL   nRBC 0.0 0.0 - 0.2 %    Comment: Performed at [facility], [place] [address],  [place], [place] [address]  Basic metabolic panel     Status: Abnormal   Collection Time: 09/11/18  6:30 AM  Result Value Ref Range   Sodium 137 135 - 145 mmol/L   Potassium 4.0 3.5 - 5.1 mmol/L   Chloride 107 98 - 111 mmol/L   CO2 23 22 - 32 mmol/L   Glucose, Bld 119 (H) 70 - 99 mg/dL   BUN 14 6 - 20 mg/dL   Creatinine, Ser 0.79 0.44 - 1.00 mg/dL   Calcium 8.9 8.9 - 10.3 mg/dL   GFR calc non Af Amer >60 >60 mL/min   GFR calc Af Amer >60 >60 mL/min   Anion gap 7 5 - 15    Comment: Performed at [facility], [place] [address], [place], [place] [address]  Magnesium     Status: None   Collection Time: 09/11/18  6:30 AM  Result Value Ref Range   Magnesium 1.8 1.7 - 2.4 mg/dL    Comment: Performed at [place] [address] [place], [place] [address]  Glucose, capillary     Status: Abnormal   Collection Time: 09/11/18  6:59 AM  Result Value Ref Range   Glucose-Capillary 115 (H) 70 - 99 mg/dL  Glucose, capillary  Status: Abnormal   Collection Time: 09/11/18 11:28 AM  Result Value Ref Range   Glucose-Capillary 123 (H) 70 - 99 mg/dL  Glucose, capillary     Status: Abnormal   Collection Time: 09/11/18  4:19 PM  Result Value Ref Range   Glucose-Capillary 135 (H) 70 - 99 mg/dL  Glucose, capillary     Status: Abnormal   Collection Time: 09/11/18  8:46 PM  Result Value Ref Range   Glucose-Capillary 115 (H) 70 - 99 mg/dL  CBC     Status: Abnormal   Collection Time: 09/12/18  5:50 AM  Result Value Ref Range   WBC 9.1 4.0 - 10.5 K/uL   RBC 3.77 (L) 3.87 - 5.11 MIL/uL   Hemoglobin 7.9 (L) 12.0 - 15.0 g/dL    Comment: Reticulocyte Hemoglobin testing may be clinically indicated, consider ordering this additional test [id number]    HCT 27.2 (L) 36.0 - 46.0 %   MCV 72.1 (L) 80.0 - 100.0 fL   MCH 21.0 (L) 26.0 - 34.0 pg   MCHC 29.0 (L) 30.0 - 36.0 g/dL   RDW 29.520.4 (H) 62.111.5 - 30.815.5 %   Platelets 217 150 - 400 K/uL   nRBC 0.0 0.0 - 0.2 %    Comment: Performed at [facility], [address] [place], [place] [address]  Basic metabolic panel     Status: Abnormal   Collection Time: 09/12/18  5:50 AM  Result Value Ref Range   Sodium 136 135 - 145 mmol/L   Potassium 4.7 3.5 - 5.1 mmol/L   Chloride 104 98 - 111 mmol/L   CO2 23 22 - 32 mmol/L   Glucose, Bld 141 (H) 70 - 99 mg/dL   BUN 18 6 - 20 mg/dL   Creatinine, Ser 6.960.92 0.44 - 1.00 mg/dL   Calcium 8.9 8.9 - 29.510.3 mg/dL   GFR calc non Af Amer >60 >60 mL/min   GFR calc Af Amer >60 >60 mL/min   Anion gap 9 5 - 15    Comment: Performed at [facility], [address] [place], [place] [address]  Glucose, capillary     Status: Abnormal   Collection Time: 09/12/18  6:57 AM  Result Value Ref Range   Glucose-Capillary 145 (H) 70 - 99 mg/dL  Glucose, capillary     Status: None   Collection Time: 09/12/18 11:31 AM  Result Value Ref Range   Glucose-Capillary 95 70 - 99 mg/dL    Dg Knee 1-2 Views Left  Result Date: 09/10/2018 CLINICAL DATA:  Chronic 1 year history of BILATERAL knee pain, LEFT greater than RIGHT. Prior RIGHT knee arthroplasty. No known injuries. EXAM: LEFT KNEE - 1-2 VIEW COMPARISON:  None. FINDINGS: No evidence of acute, subacute or healed fractures. Complete loss of the MEDIAL compartment joint space with mild narrowing of the MEDIAL tibial plateau and associated spurring. Moderate narrowing of the LATERAL compartment joint space. Mild narrowing of the patellofemoral compartment joint space with spurring along the undersurface of the patella. Osseous demineralization. Calcified loose bodies in the joint. Moderate-sized joint effusion. IMPRESSION: 1. Severe osteoarthritis involving the MEDIAL compartment, moderate osteoarthritis involving the LATERAL compartment and mild osteoarthritis involving the patellofemoral compartment. 2. Moderate-sized joint effusion. 3. Calcified loose bodies in the joint. 4. Osseous demineralization. Electronically Signed   By: Hulan Saashomas  Lawrence M.D.   On: 09/10/2018  17:10   Dg Knee 1-2 Views Right  Result Date: 09/10/2018 CLINICAL DATA:  Chronic 1 year history of BILATERAL knee  pain, LEFT greater than RIGHT. Prior RIGHT knee arthroplasty. No known injuries. EXAM: RIGHT KNEE - 1-2 VIEW COMPARISON:  10/03/2017, 08/04/2015 and earlier. FINDINGS: RIGHT total knee arthroplasty with anatomic alignment. No complicating features. No evidence of acute, subacute or healed fractures. No visible joint effusion. IMPRESSION: RIGHT total knee arthroplasty with anatomic alignment and no complicating features. Electronically Signed   By: Hulan Saashomas  Lawrence M.D.   On: 09/10/2018 17:11    Review of Systems  Constitutional: Negative for weight loss.  HENT: Negative for ear discharge, ear pain, hearing loss and tinnitus.   Eyes: Negative for blurred vision, double vision, photophobia and pain.  Respiratory: Negative for cough, sputum production and shortness of breath.   Cardiovascular: Negative for chest pain.  Gastrointestinal: Negative for abdominal pain, nausea and vomiting.  Genitourinary: Negative for dysuria, flank pain, frequency and urgency.  Musculoskeletal: Positive for joint pain (Left knee). Negative for back pain, falls, myalgias and neck pain.  Neurological: Negative for dizziness, tingling, sensory change, focal weakness, loss of consciousness and headaches.  Endo/Heme/Allergies: Does not bruise/bleed easily.  Psychiatric/Behavioral: Negative for depression, memory loss and substance abuse. The patient is not nervous/anxious.    Blood pressure 132/87, pulse 84, temperature 98.4 F (36.9 C), temperature source Oral, resp. rate 18, height 5\' 8"  (1.727 m), weight 80 kg, SpO2 100 %. Physical Exam  Constitutional: She appears well-developed and well-nourished. No distress.  HENT:  Head: Normocephalic and atraumatic.  Eyes: Conjunctivae are normal. Right eye exhibits no discharge. Left eye exhibits no discharge. No scleral icterus.  Neck: Normal range of motion.   Cardiovascular: Normal rate and regular rhythm.  Respiratory: Effort normal. No respiratory distress.  Musculoskeletal:     Comments: LLE No traumatic wounds, ecchymosis, or rash  Knee NT, pain with WB and AROM  No ankle effusion  Knee stable to varus/ valgus and anterior/posterior stress  Sens DPN, SPN, TN intact  Motor EHL, ext, flex, evers 5/5  DP 2+, PT 2+, No significant edema  Neurological: She is alert.  Skin: Skin is warm and dry. She is not diaphoretic.  Psychiatric: She has a normal mood and affect. Her behavior is normal.    Assessment/Plan: Left knee OA -- She likely needs a TKA at this point. I offered, and she accepted, a steroid injection. She needs to f/u with her [place] based orthopedic surgeon at discharge to discuss surgery. Multiple medical problems including CVA, depression, hypertension and incontinence -- per primary service    Freeman CaldronMichael J. Alger Kerstein, PA-C Orthopedic Surgery [phone] 09/12/2018, 1:22 PM

## 2018-09-12 NOTE — Progress Notes (Signed)
Patient ID: Gerhard PerchesJennifer Schwertner, female   DOB: Aug 10, 1965, 53 y.o.   MRN: [id number]  PROGRESS NOTE    Gerhard PerchesJennifer Luba  WUJ:[id number] DOB: Aug 10, 1965 DOA: 09/07/2018 PCP: Patient, No Pcp Per   Brief Narrative:  53 year old female with history of CVA, depression, hypertension and incontinence presented with unresponsiveness and was initially admitted to ICU.  Apparently patient was found unresponsive and CPR was started by group home.  When EMS arrived, she had peripheral pulses.  CT of the head was negative for acute abnormality.  No large vessel occlusion on CTA noted.  Neurology consulted.  She was found to have hemoglobin of 6.9 at outside ED with positive Hemoccult and was given 1 unit of blood.  [facility]  Psychiatry was also consulted.  Assessment & Plan: Acute encephalopathy: Metabolic versus toxic -Unsure etiology -CT of the head was negative for acute abnormality.  No large vessel occlusion on CTA of the brain. -Neurology was consulted.  MRI of the brain was a very poor quality because the patient moving a lot but showed no evidence of acute intracranial abnormality -EEG on 09/09/2018 did not show any seizure-like activity.  Neurology available as needed. -Psychiatry evaluation appreciated: Psychiatry has signed off. -Fall precautions.  Continue neurochecks. -Diet as per SLP recommendations. -B12, folate and ammonia levels normal.  Anemia: Unsure if it is acute or chronic -Presented with hemoglobin of 6.9.  No prior hemoglobin in the system -Transfused 1 unit of packed red cells on 09/08/2018  -FOBT was positive.  No overt melena or hematochezia -DC'd aspirin 81 mg twice a day. -Continue Protonix 40 mg daily -We will consider following up with GI as an outpatient, since hemoglobin is remained stable  Left knee osteoarthritis Left knee x-ray showed severe osteoarthritis, with moderate-sized joint effusion Orthopedics consulted, and  received a left knee steroid injection on 09/12/2018 Plan is to follow-up with her outpt orthopedic surgeon who operated on her right knee  Hypertension -Monitor blood pressure.  Continue amlodipine and lisinopril  Prediabetes/question of diabetes  -Blood sugars stable.  Hemoglobin A1c 5.8.  Continue CBG with SSI.  Outpatient follow-up.  Patient apparently was on metformin per med rec.  Generalized deconditioning -Patient lives in a group home -PT recommended home health PT      DVT prophylaxis: SCDs Code Status: Full Family Communication: Spoke to patient at bedside. Disposition Plan: Likely back to group home on 09/13/2018  Consultants: PCCM/neurology/psychiatry  Procedures: None  Antimicrobials: None   Subjective: Patient screaming in pain due to severe left knee pain.  Denies any other new complaints   Objective: Vitals:   09/11/18 1619 09/11/18 2046 09/12/18 0557 09/12/18 0858  BP: 118/78 130/86 117/78 132/87  Pulse: 81 82 84 84  Resp: 18 18 18 18   Temp: 99.2 F (37.3 C) 98.3 F (36.8 C) 97.8 F (36.6 C) 98.4 F (36.9 C)  TempSrc: Oral   Oral  SpO2: 100% 100% 98% 100%  Weight:  80 kg    Height:        Intake/Output Summary (Last 24 hours) at 09/12/2018 1617 Last data filed at 09/12/2018 1312 Gross per 24 hour  Intake 920 ml  Output 550 ml  Net 370 ml   Filed Weights   09/10/18 2046 09/11/18 0500 09/11/18 2046  Weight: 80.1 kg 80.1 kg 80 kg    Examination:  General: NAD   Cardiovascular: S1, S2 present  Respiratory: CTAB  Abdomen: Soft, nontender, nondistended, bowel sounds present  Musculoskeletal: No bilateral pedal edema noted.  Left knee markedly swollen and tender  Skin: Normal  Psychiatry:  Flat affect   Data Reviewed: I have personally reviewed following labs and imaging studies  CBC: Recent Labs  Lab 09/07/18 2059  09/08/18 0612 09/09/18 0625 09/10/18 0049 09/11/18 0630 09/12/18 0550  WBC 7.7  --   --  11.7* 10.3 7.9 9.1   NEUTROABS 4.1  --   --   --   --   --   --   HGB 6.9*   < > 9.2* 8.8* 7.9* 7.9* 7.9*  HCT 24.7*   < > 27.0* 30.4* 26.2* 26.8* 27.2*  MCV 69.4*  --   --  71.4* 70.2* 70.5* 72.1*  PLT 233  --   --  182 194 191 217   < > = values in this interval not displayed.   Basic Metabolic Panel: Recent Labs  Lab 09/08/18 0532 09/08/18 0612 09/09/18 0625 09/10/18 0049 09/11/18 0630 09/12/18 0550  NA 138 141 139 136 137 136  K 3.6 3.7 3.8 3.7 4.0 4.7  CL 103  --  105 103 107 104  CO2 24  --  24 22 23 23   GLUCOSE 175*  --  92 135* 119* 141*  BUN 19  --  15 18 14 18   CREATININE 0.94  --  0.87 0.89 0.79 0.92  CALCIUM 9.1  --  9.0 8.8* 8.9 8.9  MG 1.4*  --  1.6* 1.9 1.8  --   PHOS 3.2  --  3.8 4.4  --   --    GFR: Estimated Creatinine Clearance: 79.4 mL/min (by C-G formula based on SCr of 0.92 mg/dL). Liver Function Tests: Recent Labs  Lab 09/07/18 2059 09/10/18 0049  AST 23 22  ALT 18 16  ALKPHOS 57 53  BILITOT 0.4 0.7  PROT 7.8 7.0  ALBUMIN 3.9 3.2*   No results for input(s): LIPASE, AMYLASE in the last 168 hours. Recent Labs  Lab 09/08/18 0532 09/10/18 0049  AMMONIA 16 18   Coagulation Profile: Recent Labs  Lab 09/07/18 2059  INR 1.0   Cardiac Enzymes: No results for input(s): CKTOTAL, CKMB, CKMBINDEX, TROPONINI in the last 168 hours. BNP (last 3 results) No results for input(s): PROBNP in the last 8760 hours. HbA1C: No results for input(s): HGBA1C in the last 72 hours. CBG: Recent Labs  Lab 09/11/18 1128 09/11/18 1619 09/11/18 2046 09/12/18 0657 09/12/18 1131  GLUCAP 123* 135* 115* 145* 95   Lipid Profile: No results for input(s): CHOL, HDL, LDLCALC, TRIG, CHOLHDL, LDLDIRECT in the last 72 hours. Thyroid Function Tests: No results for input(s): TSH, T4TOTAL, FREET4, T3FREE, THYROIDAB in the last 72 hours. Anemia Panel: Recent Labs    09/10/18 0049  VITAMINB12 389  FOLATE 6.5   Sepsis Labs: Recent Labs  Lab 09/07/18 2116 09/07/18 2357   LATICACIDVEN 2.8* 1.7    Recent Results (from the past 240 hour(s))  Urine culture     Status: None   Collection Time: 09/07/18  8:59 PM   Specimen: Urine, Random  Result Value Ref Range Status   Specimen Description   Final    URINE, RANDOM Performed at [facility], [address] [place], [place] [address]    Special Requests   Final    NONE Performed at [facility], [address] [place], [place] [address]    Culture   Final    NO GROWTH Performed at [facility], [address]  [place], [place] [address]    Report Status 09/09/2018 FINAL  Final  SARS Coronavirus 2 (CEPHEID - Performed in [facility] hospital lab), Hosp Order     Status: None   Collection Time: 09/07/18  9:15 PM   Specimen: Nasopharyngeal Swab  Result Value Ref Range Status   SARS Coronavirus 2 NEGATIVE NEGATIVE Final    Comment: (NOTE) If result is NEGATIVE SARS-CoV-2 target nucleic acids are NOT DETECTED. The SARS-CoV-2 RNA is generally detectable in upper and lower  respiratory specimens during the acute phase of infection. The lowest  concentration of SARS-CoV-2 viral copies this assay can detect is 250  copies / mL. A negative result does not preclude SARS-CoV-2 infection  and should not be used as the sole basis for treatment or other  patient management decisions.  A negative result may occur with  improper specimen collection / handling, submission of specimen other  than nasopharyngeal swab, presence of viral mutation(s) within the  areas targeted by this assay, and inadequate number of viral copies  (<250 copies / mL). A negative result must be combined with clinical  observations, patient history, and epidemiological information. If result is POSITIVE SARS-CoV-2 target nucleic acids are DETECTED. The SARS-CoV-2 RNA is generally detectable in upper and lower  respiratory specimens dur ing the acute phase of infection.  Positive  results are indicative of active infection with  SARS-CoV-2.  Clinical  correlation with patient history and other diagnostic information is  necessary to determine patient infection status.  Positive results do  not rule out bacterial infection or co-infection with other viruses. If result is PRESUMPTIVE POSTIVE SARS-CoV-2 nucleic acids MAY BE PRESENT.   A presumptive positive result was obtained on the submitted specimen  and confirmed on repeat testing.  While 2019 novel coronavirus  (SARS-CoV-2) nucleic acids may be present in the submitted sample  additional confirmatory testing may be necessary for epidemiological  and / or clinical management purposes  to differentiate between  SARS-CoV-2 and other Sarbecovirus currently known to infect humans.  If clinically indicated additional testing with an alternate test  methodology [phone]) is advised. The SARS-CoV-2 RNA is generally  detectable in upper and lower respiratory sp ecimens during the acute  phase of infection. The expected result is Negative. Fact Sheet for Patients:  StrictlyIdeas.no Fact Sheet for Healthcare Providers: BankingDealers.co.za This test is not yet approved or cleared by the [place] FDA and has been authorized for detection and/or diagnosis of SARS-CoV-2 by FDA under an Emergency Use Authorization (EUA).  This EUA will remain in effect (meaning this test can be used) for the duration of the COVID-19 declaration under Section 564(b)(1) of the Act, 21 U.S.C. section 360bbb-3(b)(1), unless the authorization is terminated or revoked sooner. Performed at [facility], [address] [place], [place] [address]   MRSA PCR Screening     Status: None   Collection Time: 09/08/18  4:05 AM   Specimen: Nasal Mucosa; Nasopharyngeal  Result Value Ref Range Status   MRSA by PCR NEGATIVE NEGATIVE Final    Comment:        The GeneXpert MRSA Assay (FDA approved for NASAL specimens only), is one component of a  comprehensive MRSA colonization surveillance program. It is not intended to diagnose MRSA infection nor to guide or monitor treatment for MRSA infections. Performed at [facility], [place] [address] [place],  [address]          Radiology Studies: No results found.  Scheduled Meds: . amLODipine  5 mg Oral q morning - 10a  . atorvastatin  40 mg Oral QPM  . bupivacaine  10 mL Infiltration Once  . Chlorhexidine Gluconate Cloth  6 each Topical Q0600  . ferrous sulfate  325 mg Oral BID WC  . insulin aspart  0-15 Units Subcutaneous TID WC  . lisinopril  20 mg Oral q morning - 10a  . methylPREDNISolone acetate  80 mg Intra-articular Once  . pantoprazole  40 mg Oral Daily   Continuous Infusions:    LOS: 4 days        Briant CedarNkeiruka J Ottilie Wigglesworth, MD [facility] 09/12/2018, 4:17 PM

## 2018-09-13 DIAGNOSIS — R4182 Altered mental status, unspecified: Secondary | ICD-10-CM

## 2018-09-13 LAB — CBC WITH DIFFERENTIAL/PLATELET
Abs Immature Granulocytes: 0.03 10*3/uL (ref 0.00–0.07)
Basophils Absolute: 0 10*3/uL (ref 0.0–0.1)
Basophils Relative: 0 %
Eosinophils Absolute: 0 10*3/uL (ref 0.0–0.5)
Eosinophils Relative: 0 %
HCT: 27.9 % — ABNORMAL LOW (ref 36.0–46.0)
Hemoglobin: 8.1 g/dL — ABNORMAL LOW (ref 12.0–15.0)
Immature Granulocytes: 0 %
Lymphocytes Relative: 9 %
Lymphs Abs: 0.8 10*3/uL (ref 0.7–4.0)
MCH: 20.9 pg — ABNORMAL LOW (ref 26.0–34.0)
MCHC: 29 g/dL — ABNORMAL LOW (ref 30.0–36.0)
MCV: 71.9 fL — ABNORMAL LOW (ref 80.0–100.0)
Monocytes Absolute: 0.2 10*3/uL (ref 0.1–1.0)
Monocytes Relative: 2 %
Neutro Abs: 7.9 10*3/uL — ABNORMAL HIGH (ref 1.7–7.7)
Neutrophils Relative %: 89 %
Platelets: 253 10*3/uL (ref 150–400)
RBC: 3.88 MIL/uL (ref 3.87–5.11)
RDW: 20.6 % — ABNORMAL HIGH (ref 11.5–15.5)
WBC: 8.9 10*3/uL (ref 4.0–10.5)
nRBC: 0 % (ref 0.0–0.2)

## 2018-09-13 LAB — GLUCOSE, CAPILLARY
Glucose-Capillary: 230 mg/dL — ABNORMAL HIGH (ref 70–99)
Glucose-Capillary: 214 mg/dL — ABNORMAL HIGH (ref 70–99)
Glucose-Capillary: 243 mg/dL — ABNORMAL HIGH (ref 70–99)

## 2018-09-13 MED ORDER — ACETAMINOPHEN 500 MG PO TABS: 500.0000 mg | ORAL_TABLET | Freq: Four times a day (QID) | ORAL | 0 refills | Status: AC | PRN

## 2018-09-13 MED ORDER — PANTOPRAZOLE SODIUM 40 MG PO TBEC: 40.0000 mg | DELAYED_RELEASE_TABLET | Freq: Every day | ORAL | 0 refills | Status: DC

## 2018-09-13 MED ORDER — DICLOFENAC SODIUM 1 % TD GEL: 2.0000 g | Freq: Four times a day (QID) | TRANSDERMAL | 0 refills | Status: DC

## 2018-09-13 MED ORDER — FERROUS SULFATE 325 (65 FE) MG PO TABS: 325.0000 mg | ORAL_TABLET | Freq: Two times a day (BID) | ORAL | 0 refills | Status: AC

## 2018-09-13 MED ORDER — DICLOFENAC SODIUM 1 % TD GEL: 2.0000 g | Freq: Four times a day (QID) | TRANSDERMAL | 0 refills | Status: AC | PRN

## 2018-09-13 NOTE — Plan of Care (Signed)
  Problem: Pain Managment: Goal: General experience of comfort will improve 09/13/2018 1453 by Dolores Hoose, RN Outcome: Adequate for Discharge   Problem: Safety: Goal: Ability to remain free from injury will improve 09/13/2018 1453 by Dolores Hoose, RN Outcome: Adequate for Discharge 09/13/2018 1779 by Dolores Hoose, RN Outcome: Progressing

## 2018-09-13 NOTE — Progress Notes (Signed)
Occupational Therapy Treatment Patient Details Name: [name] PerchesJennifer Nicks MRN: [id number] DOB: 1965-03-05 Today's Date: 09/13/2018    History of present illness Pt is a 53 y/o female admitted secondary to episode of unresponsiveness. Pt found to have anemia and also thought to have acute encephalopath. PMH includes HTN, depression, and CVA. L knee pain limiting amb while here, and x-ray is showing osteoarthritis   OT comments  Pt. Was agreeable to UE/LE dressing tasks EOB. Pt. Was able to perform UE dressing of gown with min a for tie. Pt. Was Min A with LE dressing to don socks. Pt. Required cues for proper hand placement for sit to stand from all transfer surfaces. Pt. Has bent knees when standing and transferring and was bearing most of her weight through her arms. Pt. Was coorperative during therapy and will be followed on acute.   Follow Up Recommendations  Home health OT;Supervision/Assistance - 24 hour    Equipment Recommendations  None recommended by OT    Recommendations for Other Services      Precautions / Restrictions Precautions Precautions: Fall       Mobility Bed Mobility         Supine to sit: Supervision        Transfers Overall transfer level: Needs assistance Equipment used: Rolling walker (2 wheeled)   Sit to Stand: Min assist              Balance                                           ADL either performed or assessed with clinical judgement   ADL                   Upper Body Dressing : Set up;Sitting   Lower Body Dressing: Minimal assistance;Sit to/from stand Lower Body Dressing Details (indicate cue type and reason): PNT WAS ABLE TO DOFF OCKS AT S LEVEL AND WAS MIN A TO DON.  Toilet Transfer: Minimal assistance   Toileting- Clothing Manipulation and Hygiene: Minimal assistance       Functional mobility during ADLs: Minimal assistance General ADL Comments: PNT WAS ABLE TO SIT EOB TO PERFORM ADLS.       Vision       Perception     Praxis      Cognition Arousal/Alertness: Awake/alert Behavior During Therapy: WFL for tasks assessed/performed Overall Cognitive Status: No family/caregiver present to determine baseline cognitive functioning                                 General Comments: poor safety awareness, but likely close to baseline        Exercises     Shoulder Instructions       General Comments      Pertinent Vitals/ Pain       Pain Score: 5  Pain Location: Knees Pain Descriptors / Indicators: Aching Pain Intervention(s): Limited activity within patient's tolerance;Monitored during session  Home Living                                          Prior Functioning/Environment              Frequency  Progress Toward Goals  OT Goals(current goals can now be found in the care plan section)  Progress towards OT goals: Progressing toward goals  Acute Rehab OT Goals Patient Stated Goal: did not state goal  Plan Discharge plan remains appropriate;Frequency remains appropriate    Co-evaluation                 AM-PAC OT "6 Clicks" Daily Activity     Outcome Measure   Help from another person eating meals?: None Help from another person taking care of personal grooming?: A Little Help from another person toileting, which includes using toliet, bedpan, or urinal?: A Lot Help from another person bathing (including washing, rinsing, drying)?: A Lot Help from another person to put on and taking off regular upper body clothing?: A Little Help from another person to put on and taking off regular lower body clothing?: A Little 6 Click Score: 17    End of Session Equipment Utilized During Treatment: Rolling walker  OT Visit Diagnosis: Unsteadiness on feet (R26.81);Muscle weakness (generalized) (M62.81);Other symptoms and signs involving cognitive function   Activity Tolerance Patient tolerated treatment  well   Patient Left in chair;with call bell/phone within reach;with chair alarm set;with nursing/sitter in room   Nurse Communication          Time: 8676-7209 OT Time Calculation (min): 29 min  Charges: OT Treatments $Self Care/Home Management : 47-09 mins  6 clicks   Jaque Dacy 09/13/2018, 12:18 PM

## 2018-09-13 NOTE — Discharge Summary (Signed)
Discharge Summary  [name] PerchesJennifer Senft ZOX:[id number] DOB: [birth year]  PCP: Patient, No Pcp Per  Admit date: 09/07/2018 Discharge date: 09/13/2018  Time spent: 40 mins  Recommendations for Outpatient Follow-up:  1. Follow-up with PCP Sydnee CabalMatron Andrew, NP in 1 week 2. Follow-up with GI-screening colonoscopy, work-up for chronic anemia  Discharge Diagnoses:  Active Hospital Problems   Diagnosis Date Noted   Altered mental status    Loss of consciousness (HCC) 09/08/2018   Encephalopathy 09/08/2018    Resolved Hospital Problems  No resolved problems to display.    Discharge Condition: Stable  Diet recommendation: Heart healthy  Vitals:   09/13/18 0350 09/13/18 0900  BP: (!) 137/91 (!) 141/91  Pulse: 85 91  Resp: 16 18  Temp: 97.9 F (36.6 C) 99 F (37.2 C)  SpO2: 99% 100%    History of present illness:  53 year old female with history of CVA, depression, hypertension and incontinence presented with unresponsiveness and was initially admitted to ICU.  Apparently patient was found unresponsive and CPR was started by group home.  When EMS arrived, she had peripheral pulses.  CT of the head was negative for acute abnormality.  No large vessel occlusion on CTA noted.  Neurology consulted.  She was found to have hemoglobin of 6.9 at outside ED with positive Hemoccult and was given 1 unit of blood.  [facility]  Psychiatry was also consulted.   Today, patient denied any new complaints, stable to be discharged back to group home, with close follow-up with PCP and GI  Hospital Course:  Principal Problem:   Altered mental status Active Problems:   Loss of consciousness (HCC)   Encephalopathy  Acute encephalopathy: Metabolic versus toxic -Unsure etiology -CT of the head was negative for acute abnormality.  No large vessel occlusion on CTA of the brain. -Neurology was consulted.  MRI of the brain was a very poor quality because  the patient moving a lot but showed no evidence of acute intracranial abnormality -EEG on 09/09/2018 did not show any seizure-like activity.  Neurology available as needed. -Psychiatry evaluation appreciated: Psychiatry has signed off. -B12, folate and ammonia levels normal Follow-up with PCP  Anemia: Unsure if it is acute or chronic, although likely chronic Currently improved -Presented with hemoglobin of 6.9.  No prior hemoglobin in the system -Transfused 1 unit of packed red cells on 09/08/2018  -FOBT was positive.  No overt melena or hematochezia -DC'd aspirin 81 mg twice a day, and Celebrex -Continue Protonix 40 mg daily -Discussed with GI here, given no signs of overt bleeding, recommended to follow-up with GI outpt, as patient currently lives in the [place] -PCP advised to establish care with GI as an outpatient, with close follow-up for further work-up of anemia, and getting screening colonoscopy  Left knee osteoarthritis Left knee x-ray showed severe osteoarthritis, with moderate-sized joint effusion Orthopedics consulted, and received a left knee steroid injection on 09/12/2018 Plan is to follow-up with her outpt orthopedic surgeon who operated on her right knee  Hypertension -Monitor blood pressure.  Continue amlodipine and lisinopril  Prediabetes/question of diabetes  -Blood sugars stable.  Hemoglobin A1c 5.8.  Continue CBG with SSI.  Outpatient follow-up.  Patient apparently was on metformin per med rec.  Generalized deconditioning -Patient lives in a group home -PT recommended home health PT            Malnutrition Type:      Malnutrition Characteristics:      Nutrition  Interventions:      Estimated body mass index is 26 kg/m as calculated from the following:   Height as of this encounter:  (1.727 m).   Weight as of this encounter: 77.6 kg.    Procedures:  Left knee steroid injection on  09/12/2018  Consultations:  Orthopedics  Discharge Exam: BP (!) 141/91 (BP Location: Left Arm)    Pulse 91    Temp 99 F (37.2 C) (Oral)    Resp 18    Ht  (1.727 m)    Wt 77.6 kg    SpO2 100%    BMI 26.00 kg/m   General: NAD Cardiovascular: S1, S2 present Respiratory: CTA B  Discharge Instructions You were cared for by a hospitalist during your hospital stay. If you have any questions about your discharge medications or the care you received while you were in the hospital after you are discharged, you can call the unit and asked to speak with the hospitalist on call if the hospitalist that took care of you is not available. Once you are discharged, your primary care physician will handle any further medical issues. Please note that NO REFILLS for any discharge medications will be authorized once you are discharged, as it is imperative that you return to your primary care physician (or establish a relationship with a primary care physician if you do not have one) for your aftercare needs so that they can reassess your need for medications and monitor your lab values.   Allergies as of 09/13/2018   No Known Allergies     Medication List    STOP taking these medications   aspirin EC 81 MG tablet   celecoxib 100 MG capsule Commonly known as: CELEBREX     TAKE these medications   acetaminophen 500 MG tablet Commonly known as: TYLENOL Take 1 tablet (500 mg total) by mouth every 6 (six) hours as needed.   amLODipine 5 MG tablet Commonly known as: NORVASC Take 5 mg by mouth every morning.   atorvastatin 40 MG tablet Commonly known as: LIPITOR Take 40 mg by mouth every evening.   busPIRone 10 MG tablet Commonly known as: BUSPAR Take 10 mg by mouth 3 (three) times daily.   cholecalciferol 25 MCG (1000 UT) tablet Commonly known as: VITAMIN D3 Take 1,000 Units by mouth every morning.   diclofenac sodium 1 % Gel Commonly known as: VOLTAREN Apply 2 g topically 4 (four)  times daily as needed.   ferrous sulfate 325 (65 FE) MG tablet Take 1 tablet (325 mg total) by mouth 2 (two) times daily with a meal.   Fluticasone-Salmeterol 250-50 MCG/DOSE Aepb Commonly known as: ADVAIR Inhale 1 puff into the lungs 2 (two) times daily.   furosemide 20 MG tablet Commonly known as: LASIX Take 20 mg by mouth 2 (two) times daily.   lisinopril 20 MG tablet Commonly known as: ZESTRIL Take 20 mg by mouth every morning.   metFORMIN 500 MG 24 hr tablet Commonly known as: GLUCOPHAGE-XR Take 500 mg by mouth 2 (two) times a day.   pantoprazole 40 MG tablet Commonly known as: PROTONIX Take 1 tablet (40 mg total) by mouth daily. Start taking on: September 14, 2018   potassium chloride 10 MEQ tablet Commonly known as: K-DUR Take 10 mEq by mouth every morning.   traZODone 100 MG tablet Commonly known as: DESYREL Take 100 mg by mouth at bedtime.      No Known Allergies Follow-up Information    Tawni Levy,  Greig CastillaAndrew, NP. Schedule an appointment as soon as possible for a visit in 1 week(s).   Specialty: Nurse Practitioner Contact information: [address] [place] [id number] [phone]            The results of significant diagnostics from this hospitalization (including imaging, microbiology, ancillary and laboratory) are listed below for reference.    Significant Diagnostic Studies: Ct Code Stroke Cta Head W/wo Contrast  Result Date: 09/08/2018 EXAM: CT ANGIOGRAPHY HEAD AND NECK CT PERFUSION BRAIN TECHNIQUE: Multidetector CT imaging of the head and neck was performed using the standard protocol during bolus administration of intravenous contrast. Multiplanar CT image reconstructions and MIPs were obtained to evaluate the vascular anatomy. Carotid stenosis measurements (when applicable) are obtained utilizing NASCET criteria, using the distal internal carotid diameter as the denominator. Multiphase CT imaging of the brain was performed following IV bolus  contrast injection. Subsequent parametric perfusion maps were calculated using RAPID software. CONTRAST:  150mL OMNIPAQUE IOHEXOL 350 MG/ML SOLN COMPARISON:  Prior CT from earlier the same day. 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 Left common carotid artery arises first, followed by the right common carotid artery, and then the right subclavian artery. The left subclavian artery arises from the proximal left common carotid artery (left brachiocephalic artery). Mild atheromatous plaque about the proximal subclavian arteries bilaterally without flow-limiting stenosis. No other hemodynamically significant stenosis or other acute finding about the arch or origin of the great vessels. Right carotid system: Right common carotid artery patent from its origin to the bifurcation without stenosis. Mild eccentric calcified plaque about the right bifurcation/proximal right ICA without hemodynamically significant stenosis. Right ICA patent distally to the skull base without stenosis, dissection or occlusion. Left carotid system: Left common carotid artery patent from its origin to the bifurcation without stenosis. No significant atheromatous narrowing about the left bifurcation. Left ICA patent distally to the skull base without stenosis, dissection, or occlusion. Vertebral arteries: Both of the vertebral arteries arise from the subclavian arteries. Right vertebral artery dominant. Vertebral arteries patent within the neck without stenosis, dissection, or occlusion. Skeleton: No acute osseous finding. No discrete lytic or blastic osseous lesions. Other neck: No other acute soft tissue abnormality within the neck. Salivary glands within normal limits. Thyroid normal. No adenopathy. Upper chest: Visualized upper chest demonstrates no acute finding. Scattered coronary artery calcifications noted. Review of the MIP images confirms the above findings CTA HEAD  FINDINGS Anterior circulation: Evaluation of the intracranial circulation limited by timing of the contrast bolus as well as motion artifact. Petrous segments widely patent. Mild scattered atheromatous plaque within the cavernous/supraclinoid ICAs without hemodynamically significant stenosis. A1 segments patent bilaterally. Normal anterior communicating artery complex. Anterior cerebral arteries grossly patent to their distal aspects without stenosis. No M1 stenosis or occlusion. Normal MCA bifurcations. Distal MCA branches well perfused and symmetric. Posterior circulation: Dominant right vertebral artery patent to the vertebrobasilar junction without stenosis. Right PICA patent proximally. Hypoplastic left vertebral artery divides at the takeoff of the left PICA, with a small branch ascending towards the vertebrobasilar junction. Left PICA patent proximally. Basilar patent to its distal aspect without appreciable stenosis. Superior cerebral arteries patent proximally. Left PCA predominantly supplied via the basilar. Predominant fetal type origin of the right PCA supplied via a robust right posterior communicating artery. PCAs perfused to their distal aspects without appreciable stenosis. Venous sinuses: Patent. Anatomic variants: Fetal type origin of the right PCA. Dominant right vertebral artery. Delayed phase: Not  performed. Review of the MIP images confirms the above findings CT Brain Perfusion Findings: ASPECTS: No aspects score determine. CBF (<30%) Volume: 0mL Perfusion (Tmax>6.0s) volume: 96mL Mismatch Volume: 96mL Infarction Location:Negative CT perfusion for acute core infarct. Apparent scattered ill-defined perfusion abnormality involving both cerebral hemispheres felt to be artifactual nature due to motion artifact on this exam. IMPRESSION: 1. Negative CTA for emergent large vessel occlusion. 2. Negative CT perfusion for acute core infarct. Apparent scattered perfusion abnormality involving both  cerebral hemispheres felt to be secondary to motion artifact. 3. Mild atherosclerotic change about the right carotid bifurcation and carotid siphons without hemodynamically significant or correctable stenosis. 4. 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 Critical Value/emergent results were discussed by telephone at the time of interpretation on 09/07/2018 at 11:30 pm to Dr. Verdie Mosher, who verbally acknowledged these results. Electronically Signed   By: Rise Mu M.D.   On: 09/08/2018 00:08   Dg Knee 1-2 Views Left  Result Date: 09/10/2018 CLINICAL DATA:  Chronic 1 year history of BILATERAL knee pain, LEFT greater than RIGHT. Prior RIGHT knee arthroplasty. No known injuries. EXAM: LEFT KNEE - 1-2 VIEW COMPARISON:  None. FINDINGS: No evidence of acute, subacute or healed fractures. Complete loss of the MEDIAL compartment joint space with mild narrowing of the MEDIAL tibial plateau and associated spurring. Moderate narrowing of the LATERAL compartment joint space. Mild narrowing of the patellofemoral compartment joint space with spurring along the undersurface of the patella. Osseous demineralization. Calcified loose bodies in the joint. Moderate-sized joint effusion. IMPRESSION: 1. Severe osteoarthritis involving the MEDIAL compartment, moderate osteoarthritis involving the LATERAL compartment and mild osteoarthritis involving the patellofemoral compartment. 2. Moderate-sized joint effusion. 3. Calcified loose bodies in the joint. 4. Osseous demineralization. Electronically Signed   By: Hulan Saas M.D.   On: 09/10/2018 17:10   Dg Knee 1-2 Views Right  Result Date: 09/10/2018 CLINICAL DATA:  Chronic 1 year history of BILATERAL knee pain, LEFT greater than RIGHT. Prior RIGHT knee arthroplasty. No known injuries. EXAM: RIGHT KNEE - 1-2 VIEW COMPARISON:  10/03/2017, 08/04/2015 and earlier. FINDINGS: RIGHT total knee arthroplasty with anatomic alignment. No complicating features.  No evidence of acute, subacute or healed fractures. No visible joint effusion. IMPRESSION: RIGHT total knee arthroplasty with anatomic alignment and no complicating features. Electronically Signed   By: Hulan Saas M.D.   On: 09/10/2018 17:11   Ct Head Wo Contrast  Result Date: 09/07/2018 CLINICAL DATA:  Altered mental status (AMS), unclear cause. Collapsed. EXAM: CT HEAD WITHOUT CONTRAST CT CERVICAL SPINE WITHOUT CONTRAST TECHNIQUE: Multidetector CT imaging of the head and cervical spine was performed following the standard protocol without intravenous contrast. Multiplanar CT image reconstructions of the cervical spine were also generated. COMPARISON:  None. FINDINGS: CT HEAD FINDINGS Brain: No intracranial hemorrhage, mass effect, or midline shift. Mild generalized atrophy and chronic small vessel ischemia. No hydrocephalus. The basilar cisterns are patent. Remote lacunar infarcts in the basal ganglia and left caudate. Possible small remote infarct in left cerebellum. No evidence of territorial infarct or acute ischemia. No extra-axial or intracranial fluid collection. Vascular: No hyperdense vessel. Skull: No fracture or focal lesion. Sinuses/Orbits: Scattered ethmoid air cell opacification. No sinus fluid levels. Visualized mastoid air cells are clear. Included orbits are unremarkable. Other: None. CT CERVICAL SPINE FINDINGS Mild motion artifact limitation, patient had difficulty tolerating the exam and had to be restrained. Alignment: Normal. Skull base and vertebrae: No displaced fracture, motion limits detailed evaluation at multiple levels, particularly skull base and  C1-C2. Soft tissues and spinal canal: No prevertebral fluid or swelling. No visible canal hematoma. Upper esophagus is patulous. Disc levels: Diffuse degenerative disc disease. Multilevel facet arthropathy. Upper chest: No acute findings. Other: Carotid calcifications. IMPRESSION: 1. No acute intracranial abnormality. 2. Mild atrophy  and chronic small vessel ischemia. 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 3. Motion artifact limits detailed evaluation of the cervical spine. No gross fracture or subluxation. Multilevel degenerative disc disease and facet arthropathy. Electronically Signed   By: Narda Rutherford M.D.   On: 09/07/2018 21:48   Ct Code Stroke Cta Neck W/wo Contrast  Result Date: 09/08/2018 EXAM: CT ANGIOGRAPHY HEAD AND NECK CT PERFUSION BRAIN TECHNIQUE: Multidetector CT imaging of the head and neck was performed using the standard protocol during bolus administration of intravenous contrast. Multiplanar CT image reconstructions and MIPs were obtained to evaluate the vascular anatomy. Carotid stenosis measurements (when applicable) are obtained utilizing NASCET criteria, using the distal internal carotid diameter as the denominator. Multiphase CT imaging of the brain was performed following IV bolus contrast injection. Subsequent parametric perfusion maps were calculated using RAPID software. CONTRAST:  OMNIPAQUE IOHEXOL 350 MG/ML SOLN COMPARISON:  Prior CT from earlier the same day. 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 Left common carotid artery arises first, followed by the right common carotid artery, and then the right subclavian artery. The left subclavian artery arises from the proximal left common carotid artery (left brachiocephalic artery). Mild atheromatous plaque about the proximal subclavian arteries bilaterally without flow-limiting stenosis. No other hemodynamically significant stenosis or other acute finding about the arch or origin of the great vessels. Right carotid system: Right common carotid artery patent from its origin to the bifurcation without stenosis. Mild eccentric calcified plaque about the right bifurcation/proximal right ICA without hemodynamically significant stenosis. Right ICA patent distally to the skull base  without stenosis, dissection or occlusion. Left carotid system: Left common carotid artery patent from its origin to the bifurcation without stenosis. No significant atheromatous narrowing about the left bifurcation. Left ICA patent distally to the skull base without stenosis, dissection, or occlusion. Vertebral arteries: Both of the vertebral arteries arise from the subclavian arteries. Right vertebral artery dominant. Vertebral arteries patent within the neck without stenosis, dissection, or occlusion. Skeleton: No acute osseous finding. No discrete lytic or blastic osseous lesions. Other neck: No other acute soft tissue abnormality within the neck. Salivary glands within normal limits. Thyroid normal. No adenopathy. Upper chest: Visualized upper chest demonstrates no acute finding. Scattered coronary artery calcifications noted. Review of the MIP images confirms the above findings CTA HEAD FINDINGS Anterior circulation: Evaluation of the intracranial circulation limited by timing of the contrast bolus as well as motion artifact. Petrous segments widely patent. Mild scattered atheromatous plaque within the cavernous/supraclinoid ICAs without hemodynamically significant stenosis. A1 segments patent bilaterally. Normal anterior communicating artery complex. Anterior cerebral arteries grossly patent to their distal aspects without stenosis. No M1 stenosis or occlusion. Normal MCA bifurcations. Distal MCA branches well perfused and symmetric. Posterior circulation: Dominant right vertebral artery patent to the vertebrobasilar junction without stenosis. Right PICA patent proximally. Hypoplastic left vertebral artery divides at the takeoff of the left PICA, with a small branch ascending towards the vertebrobasilar junction. Left PICA patent proximally. Basilar patent to its distal aspect without appreciable stenosis. Superior cerebral arteries patent proximally. Left PCA predominantly supplied via the basilar.  Predominant fetal type origin of the right PCA supplied via a robust right posterior  communicating artery. PCAs perfused to their distal aspects without appreciable stenosis. Venous sinuses: Patent. Anatomic variants: Fetal type origin of the right PCA. Dominant right vertebral artery. Delayed phase: Not performed. Review of the MIP images confirms the above findings CT Brain Perfusion Findings: ASPECTS: No aspects score determine. CBF (<30%) Volume: 0mL Perfusion (Tmax>6.0s) volume: 96mL Mismatch Volume: 96mL Infarction Location:Negative CT perfusion for acute core infarct. Apparent scattered ill-defined perfusion abnormality involving both cerebral hemispheres felt to be artifactual nature due to motion artifact on this exam. IMPRESSION: 1. Negative CTA for emergent large vessel occlusion. 2. Negative CT perfusion for acute core infarct. Apparent scattered perfusion abnormality involving both cerebral hemispheres felt to be secondary to motion artifact. 3. Mild atherosclerotic change about the right carotid bifurcation and carotid siphons without hemodynamically significant or correctable stenosis. 4. 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 Critical Value/emergent results were discussed by telephone at the time of interpretation on 09/07/2018 at 11:30 pm to Dr. Verdie MosherLiu, who verbally acknowledged these results. Electronically Signed   By: Rise MuBenjamin  McClintock M.D.   On: 09/08/2018 00:08   Ct Cervical Spine Wo Contrast  Result Date: 09/07/2018 CLINICAL DATA:  Altered mental status (AMS), unclear cause. Collapsed. EXAM: CT HEAD WITHOUT CONTRAST CT CERVICAL SPINE WITHOUT CONTRAST TECHNIQUE: Multidetector CT imaging of the head and cervical spine was performed following the standard protocol without intravenous contrast. Multiplanar CT image reconstructions of the cervical spine were also generated. COMPARISON:  None. FINDINGS: CT HEAD FINDINGS Brain: No intracranial hemorrhage, mass effect,  or midline shift. Mild generalized atrophy and chronic small vessel ischemia. No hydrocephalus. The basilar cisterns are patent. Remote lacunar infarcts in the basal ganglia and left caudate. Possible small remote infarct in left cerebellum. No evidence of territorial infarct or acute ischemia. No extra-axial or intracranial fluid collection. Vascular: No hyperdense vessel. Skull: No fracture or focal lesion. Sinuses/Orbits: Scattered ethmoid air cell opacification. No sinus fluid levels. Visualized mastoid air cells are clear. Included orbits are unremarkable. Other: None. CT CERVICAL SPINE FINDINGS Mild motion artifact limitation, patient had difficulty tolerating the exam and had to be restrained. Alignment: Normal. Skull base and vertebrae: No displaced fracture, motion limits detailed evaluation at multiple levels, particularly skull base and C1-C2. Soft tissues and spinal canal: No prevertebral fluid or swelling. No visible canal hematoma. Upper esophagus is patulous. Disc levels: Diffuse degenerative disc disease. Multilevel facet arthropathy. Upper chest: No acute findings. Other: Carotid calcifications. IMPRESSION: 1. No acute intracranial abnormality. 2. Mild atrophy and chronic small vessel ischemia. 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 3. Motion artifact limits detailed evaluation of the cervical spine. No gross fracture or subluxation. Multilevel degenerative disc disease and facet arthropathy. Electronically Signed   By: Narda RutherfordMelanie  Sanford M.D.   On: 09/07/2018 21:48   Mr Brain Wo Contrast  Result Date: 09/09/2018 CLINICAL DATA:  Encephalopathy EXAM: MRI HEAD WITHOUT CONTRAST TECHNIQUE: Multiplanar, multiecho pulse sequences of the brain and surrounding structures were obtained without intravenous contrast. COMPARISON:  CTA head neck 09/07/2018 FINDINGS: Examination is severely degraded by motion. The examination was discontinued prematurely due to patient inability to cooperate with  technologist's instructions and attempts to remove the head coil. Axial and coronal diffusion-weighted imaging and 3 anatomic imaging sequences were obtained. There is no acute infarct, acute hemorrhage or extra-axial collection. The midline structures are normal. There is no midline shift or mass effect. Early confluent hyperintense T2-weighted signal of the periventricular and deep white matter, most commonly due to chronic ischemic microangiopathy. IMPRESSION:  1. Truncated and motion degraded examination. 2. No acute intracranial abnormality. 3. 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 Electronically Signed   By: Deatra Robinson M.D.   On: 09/09/2018 04:00   Ct Code Stroke Cta Cerebral Perfusion W/wo Contrast  Result Date: 09/08/2018 EXAM: CT ANGIOGRAPHY HEAD AND NECK CT PERFUSION BRAIN TECHNIQUE: Multidetector CT imaging of the head and neck was performed using the standard protocol during bolus administration of intravenous contrast. Multiplanar CT image reconstructions and MIPs were obtained to evaluate the vascular anatomy. Carotid stenosis measurements (when applicable) are obtained utilizing NASCET criteria, using the distal internal carotid diameter as the denominator. Multiphase CT imaging of the brain was performed following IV bolus contrast injection. Subsequent parametric perfusion maps were calculated using RAPID software. CONTRAST:  OMNIPAQUE IOHEXOL 350 MG/ML SOLN COMPARISON:  Prior CT from earlier the same day. 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 Left common carotid artery arises first, followed by the right common carotid artery, and then the right subclavian artery. The left subclavian artery arises from the proximal left common carotid artery (left brachiocephalic artery). Mild atheromatous plaque about the proximal subclavian arteries bilaterally without flow-limiting stenosis. No other  hemodynamically significant stenosis or other acute finding about the arch or origin of the great vessels. Right carotid system: Right common carotid artery patent from its origin to the bifurcation without stenosis. Mild eccentric calcified plaque about the right bifurcation/proximal right ICA without hemodynamically significant stenosis. Right ICA patent distally to the skull base without stenosis, dissection or occlusion. Left carotid system: Left common carotid artery patent from its origin to the bifurcation without stenosis. No significant atheromatous narrowing about the left bifurcation. Left ICA patent distally to the skull base without stenosis, dissection, or occlusion. Vertebral arteries: Both of the vertebral arteries arise from the subclavian arteries. Right vertebral artery dominant. Vertebral arteries patent within the neck without stenosis, dissection, or occlusion. Skeleton: No acute osseous finding. No discrete lytic or blastic osseous lesions. Other neck: No other acute soft tissue abnormality within the neck. Salivary glands within normal limits. Thyroid normal. No adenopathy. Upper chest: Visualized upper chest demonstrates no acute finding. Scattered coronary artery calcifications noted. Review of the MIP images confirms the above findings CTA HEAD FINDINGS Anterior circulation: Evaluation of the intracranial circulation limited by timing of the contrast bolus as well as motion artifact. Petrous segments widely patent. Mild scattered atheromatous plaque within the cavernous/supraclinoid ICAs without hemodynamically significant stenosis. A1 segments patent bilaterally. Normal anterior communicating artery complex. Anterior cerebral arteries grossly patent to their distal aspects without stenosis. No M1 stenosis or occlusion. Normal MCA bifurcations. Distal MCA branches well perfused and symmetric. Posterior circulation: Dominant right vertebral artery patent to the vertebrobasilar junction  without stenosis. Right PICA patent proximally. Hypoplastic left vertebral artery divides at the takeoff of the left PICA, with a small branch ascending towards the vertebrobasilar junction. Left PICA patent proximally. Basilar patent to its distal aspect without appreciable stenosis. Superior cerebral arteries patent proximally. Left PCA predominantly supplied via the basilar. Predominant fetal type origin of the right PCA supplied via a robust right posterior communicating artery. PCAs perfused to their distal aspects without appreciable stenosis. Venous sinuses: Patent. Anatomic variants: Fetal type origin of the right PCA. Dominant right vertebral artery. Delayed phase: Not performed. Review of the MIP images confirms the above findings CT Brain Perfusion Findings: ASPECTS: No aspects score determine. CBF (<30%) Volume: 0mL Perfusion (Tmax>6.0s) volume: 96mL Mismatch Volume:  16mL Infarction Location:Negative CT perfusion for acute core infarct. Apparent scattered ill-defined perfusion abnormality involving both cerebral hemispheres felt to be artifactual nature due to motion artifact on this exam. IMPRESSION: 1. Negative CTA for emergent large vessel occlusion. 2. Negative CT perfusion for acute core infarct. Apparent scattered perfusion abnormality involving both cerebral hemispheres felt to be secondary to motion artifact. 3. Mild atherosclerotic change about the right carotid bifurcation and carotid siphons without hemodynamically significant or correctable stenosis. 4. 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 Critical Value/emergent results were discussed by telephone at the time of interpretation on 09/07/2018 at 11:30 pm to Dr. Oleta Mouse, who verbally acknowledged these results. Electronically Signed   By: Jeannine Boga M.D.   On: 09/08/2018 00:08   Dg Chest Port 1 View  Result Date: 09/07/2018 CLINICAL DATA:  Unresponsive. EXAM: PORTABLE CHEST 1 VIEW COMPARISON:  None.  FINDINGS: The heart size and mediastinal contours are within normal limits. Both lungs are clear. The visualized skeletal structures are unremarkable. IMPRESSION: No active disease. Electronically Signed   By: Constance Holster M.D.   On: 09/07/2018 21:59    Microbiology: Recent Results (from the past 240 hour(s))  Urine culture     Status: None   Collection Time: 09/07/18  8:59 PM   Specimen: Urine, Random  Result Value Ref Range Status   Specimen Description   Final    URINE, RANDOM Performed at [facility], [address] [place], [place] [address]    Special Requests   Final    NONE Performed at [facility], [address], [place], [place] [address]    Culture   Final    NO GROWTH Performed at [facility], [place] [address] [place], [place] [address]    Report Status 09/09/2018 FINAL  Final  SARS Coronavirus 2 (CEPHEID - Performed in [place] hospital lab), Hosp Order     Status: None   Collection Time: 09/07/18  9:15 PM   Specimen: Nasopharyngeal Swab  Result Value Ref Range Status   SARS Coronavirus 2 NEGATIVE NEGATIVE Final    Comment: (NOTE) If result is NEGATIVE SARS-CoV-2 target nucleic acids are NOT DETECTED. The SARS-CoV-2 RNA is generally detectable in upper and lower  respiratory specimens during the acute phase of infection. The lowest  concentration of SARS-CoV-2 viral copies this assay can detect is 250  copies / mL. A negative result does not preclude SARS-CoV-2 infection  and should not be used as the sole basis for treatment or other  patient management decisions.  A negative result may occur with  improper specimen collection / handling, submission of specimen other  than nasopharyngeal swab, presence of viral mutation(s) within the  areas targeted by this assay, and inadequate number of viral copies  (<250 copies / mL). A negative result must be combined with clinical  observations, patient history, and epidemiological information. If result  is POSITIVE SARS-CoV-2 target nucleic acids are DETECTED. The SARS-CoV-2 RNA is generally detectable in upper and lower  respiratory specimens dur ing the acute phase of infection.  Positive  results are indicative of active infection with SARS-CoV-2.  Clinical  correlation with patient history and other diagnostic information is  necessary to determine patient infection status.  Positive results do  not rule out bacterial infection or co-infection with other viruses. If result is PRESUMPTIVE POSTIVE SARS-CoV-2 nucleic acids MAY BE PRESENT.   A presumptive positive result was obtained on the submitted specimen  and confirmed on repeat testing.  While 2019 novel coronavirus  (SARS-CoV-2) nucleic acids may be present in the submitted sample  additional confirmatory testing may be necessary for epidemiological  and / or clinical management purposes  to differentiate between  SARS-CoV-2 and other Sarbecovirus currently known to infect humans.  If clinically indicated additional testing with an alternate test  methodology [phone]) is advised. The SARS-CoV-2 RNA is generally  detectable in upper and lower respiratory sp ecimens during the acute  phase of infection. The expected result is Negative. Fact Sheet for Patients:  BoilerBrush.com.cy Fact Sheet for Healthcare Providers: https://pope.com/ This test is not yet approved or cleared by the [place] FDA and has been authorized for detection and/or diagnosis of SARS-CoV-2 by FDA under an Emergency Use Authorization (EUA).  This EUA will remain in effect (meaning this test can be used) for the duration of the COVID-19 declaration under Section 564(b)(1) of the Act, 21 U.S.C. section 360bbb-3(b)(1), unless the authorization is terminated or revoked sooner. Performed at [facility], [address], [place], [place] [address]   MRSA PCR Screening     Status: None   Collection Time:  09/08/18  4:05 AM   Specimen: Nasal Mucosa; Nasopharyngeal  Result Value Ref Range Status   MRSA by PCR NEGATIVE NEGATIVE Final    Comment:        The GeneXpert MRSA Assay (FDA approved for NASAL specimens only), is one component of a comprehensive MRSA colonization surveillance program. It is not intended to diagnose MRSA infection nor to guide or monitor treatment for MRSA infections. Performed at [facility], [address], [place], [place] [address]      Labs: Basic Metabolic Panel: Recent Labs  Lab 09/08/18 0532 09/08/18 0612 09/09/18 0625 09/10/18 0049 09/11/18 0630 09/12/18 0550  NA 138 141 139 136 137 136  K 3.6 3.7 3.8 3.7 4.0 4.7  CL 103  --  105 103 107 104  CO2 24  --  GLUCOSE 175*  --  92 135* 119* 141*  BUN 19  --  CREATININE 0.94  --  0.87 0.89 0.79 0.92  CALCIUM 9.1  --  9.0 8.8* 8.9 8.9  MG 1.4*  --  1.6* 1.9 1.8  --   PHOS 3.2  --  3.8 4.4  --   --    Liver Function Tests: Recent Labs  Lab 09/07/18 2059 09/10/18 0049  AST 23 22  ALT 18 16  ALKPHOS 57 53  BILITOT 0.4 0.7  PROT 7.8 7.0  ALBUMIN 3.9 3.2*   No results for input(s): LIPASE, AMYLASE in the last 168 hours. Recent Labs  Lab 09/08/18 0532 09/10/18 0049  AMMONIA 16 18   CBC: Recent Labs  Lab 09/07/18 2059  09/09/18 0625 09/10/18 0049 09/11/18 0630 09/12/18 0550 09/13/18 0457  WBC 7.7  --  11.7* 10.3 7.9 9.1 8.9  NEUTROABS 4.1  --   --   --   --   --  7.9*  HGB 6.9*   < > 8.8* 7.9* 7.9* 7.9* 8.1*  HCT 24.7*   < > 30.4* 26.2* 26.8* 27.2* 27.9*  MCV 69.4*  --  71.4* 70.2* 70.5* 72.1* 71.9*  PLT 233  --  182 194 191 217 253   < > = values in this interval not displayed.   Cardiac Enzymes: No results for input(s): CKTOTAL, CKMB, CKMBINDEX, TROPONINI in the last 168 hours. BNP: BNP (last 3 results) No results for  input(s): BNP in the last 8760 hours.  ProBNP (last 3 results) No results for input(s): PROBNP in the last 8760  hours.  CBG: Recent Labs  Lab 09/12/18 1131 09/12/18 1700 09/12/18 2100 09/13/18 0653 09/13/18 1141  GLUCAP 95 196* 334* 230* 214*       Signed:  Briant Cedar, MD [facility] 09/13/2018, 3:03 PM

## 2018-09-13 NOTE — Plan of Care (Signed)
  Problem: Pain Managment: Goal: General experience of comfort will improve Outcome: Progressing   Problem: Safety: Goal: Ability to remain free from injury will improve Outcome: Progressing   

## 2018-09-13 NOTE — Progress Notes (Addendum)
DISCHARGE NOTE  [name] [name] to be discharged [facility] per MD order. Patient verbalized understanding.  Skin clean, dry and intact without evidence of skin break down, no evidence of skin tears noted. IV catheter discontinued intact. Site without signs and symptoms of complications. Dressing and pressure applied. Pt denies pain at the site currently. No complaints noted.  Patient free of lines, drains, and wounds.   Discharge packet assembled. An After Visit Summary (AVS) was printed and given to the EMS personnel. Patient escorted via stretcher and discharged to designated Group Home via ambulance. Report called to accepting facility; all questions and concerns addressed.   Dolores Hoose, RN

## 2018-09-13 NOTE — TOC Transition Note (Signed)
Transition of Care [facility]) - CM/SW Discharge Note *09/13/18-Patient discharged back to [facility] via ambulance   Patient Details  Name: [name] [name] MRN: [id number] Date of Birth: 04-Sep-1965  Transition of Care [facility]) CM/SW Contact:  Sable Feil, LCSW Phone Number: 09/13/2018, 3:17 PM   Clinical Narrative:  Ms. [name], group home manager [phone]) contacted and advised of patient's readiness for discharge. Ms. Jeanell Sparrow requested that patient return to facility via ambulance. Attempted to reach patient's SW guardian - Mickel Fuchs with [facility] - [phone]) and message left regarding patient's discharge. Patient will be provided with her discharge paperwork by nurse, to be given to Ms. Ray.      Final next level of care: Group Home Barriers to Discharge: No Barriers Identified   Patient Goals and CMS Choice Patient states their goals for this hospitalization and ongoing recovery are:: Not stated by patient CMS Medicare.gov Compare Post Acute Care list provided to:: Other (Comment Required)(n/a) Choice offered to / list presented to : NA  Discharge Placement              Patient chooses bed at: (Patient returning to [facility]) Patient to be transferred to facility by: Non-emergency ambulance Name of family member notified: [name], group home contact - [phone] Patient and family notified of of transfer: 09/13/18  Discharge Plan and Services In-house Referral: Clinical Social Work(Referral not recieved, however CSW following based on patient being from a group home) Discharge Planning Services: Other - See comment(Patient will receive assistant with transportation once medically stable for discharge)                                Social Determinants of Health (SDOH) Interventions  No SDOH interventions needed.   Readmission Risk Interventions No flowsheet data found.

## 2018-09-13 NOTE — Plan of Care (Signed)
  Problem: Education: Goal: Knowledge of General Education information will improve Description: Including pain rating scale, medication(s)/side effects and non-pharmacologic comfort measures Outcome: Completed/Met   Problem: Health Behavior/Discharge Planning: Goal: Ability to manage health-related needs will improve Outcome: Completed/Met   Problem: Activity: Goal: Risk for activity intolerance will decrease Outcome: Completed/Met   Problem: Elimination: Goal: Will not experience complications related to bowel motility Outcome: Completed/Met

## 2018-09-16 ENCOUNTER — Encounter: Payer: Self-pay | Admitting: Orthopedic Surgery

## 2018-09-16 NOTE — Progress Notes (Signed)
DC summary faxed to Care Home - [phone].   Manya Silvas, RN CM [phone]

## 2018-11-22 ENCOUNTER — Other Ambulatory Visit
Admission: RE | Admit: 2018-11-22 | Discharge: 2018-11-22 | Disposition: A | Discharge status: Home / Self Care | Payer: Medicaid Other | Source: Ambulatory Visit | Attending: Internal Medicine | Admitting: Internal Medicine

## 2018-11-22 DIAGNOSIS — Z20828 Contact with and (suspected) exposure to other viral communicable diseases: Secondary | ICD-10-CM | POA: Diagnosis not present

## 2018-11-22 DIAGNOSIS — Z01812 Encounter for preprocedural laboratory examination: Secondary | ICD-10-CM | POA: Diagnosis not present

## 2018-11-23 LAB — SARS CORONAVIRUS 2 (TAT 6-24 HRS): SARS Coronavirus 2: NEGATIVE

## 2018-11-27 ENCOUNTER — Ambulatory Visit: Payer: Medicaid Other | Admitting: Certified Registered Nurse Anesthetist

## 2018-11-27 ENCOUNTER — Encounter: Payer: Self-pay | Admitting: Certified Registered Nurse Anesthetist

## 2018-11-27 ENCOUNTER — Encounter
Admission: RE | Disposition: A | Discharge status: Home / Self Care | Payer: Self-pay | Source: Home / Self Care | Attending: Internal Medicine

## 2018-11-27 ENCOUNTER — Ambulatory Visit
Admission: RE | Admit: 2018-11-27 | Discharge: 2018-11-27 | Disposition: A | Discharge status: Home / Self Care | Payer: Medicaid Other | Attending: Internal Medicine | Admitting: Internal Medicine

## 2018-11-27 DIAGNOSIS — Z7951 Long term (current) use of inhaled steroids: Secondary | ICD-10-CM | POA: Diagnosis not present

## 2018-11-27 DIAGNOSIS — K219 Gastro-esophageal reflux disease without esophagitis: Secondary | ICD-10-CM | POA: Diagnosis not present

## 2018-11-27 DIAGNOSIS — F329 Major depressive disorder, single episode, unspecified: Secondary | ICD-10-CM | POA: Diagnosis not present

## 2018-11-27 DIAGNOSIS — Z79899 Other long term (current) drug therapy: Secondary | ICD-10-CM | POA: Diagnosis not present

## 2018-11-27 DIAGNOSIS — Z7984 Long term (current) use of oral hypoglycemic drugs: Secondary | ICD-10-CM | POA: Insufficient documentation

## 2018-11-27 DIAGNOSIS — K449 Diaphragmatic hernia without obstruction or gangrene: Secondary | ICD-10-CM | POA: Diagnosis not present

## 2018-11-27 DIAGNOSIS — I10 Essential (primary) hypertension: Secondary | ICD-10-CM | POA: Diagnosis not present

## 2018-11-27 DIAGNOSIS — F17200 Nicotine dependence, unspecified, uncomplicated: Secondary | ICD-10-CM | POA: Diagnosis not present

## 2018-11-27 DIAGNOSIS — E119 Type 2 diabetes mellitus without complications: Secondary | ICD-10-CM | POA: Insufficient documentation

## 2018-11-27 DIAGNOSIS — Z7982 Long term (current) use of aspirin: Secondary | ICD-10-CM | POA: Diagnosis not present

## 2018-11-27 DIAGNOSIS — D500 Iron deficiency anemia secondary to blood loss (chronic): Secondary | ICD-10-CM | POA: Diagnosis not present

## 2018-11-27 DIAGNOSIS — M1990 Unspecified osteoarthritis, unspecified site: Secondary | ICD-10-CM | POA: Diagnosis not present

## 2018-11-27 DIAGNOSIS — Z8673 Personal history of transient ischemic attack (TIA), and cerebral infarction without residual deficits: Secondary | ICD-10-CM | POA: Diagnosis not present

## 2018-11-27 HISTORY — PX: COLONOSCOPY WITH PROPOFOL: SHX5780

## 2018-11-27 HISTORY — PX: ESOPHAGOGASTRODUODENOSCOPY (EGD) WITH PROPOFOL: SHX5813

## 2018-11-27 LAB — GLUCOSE, CAPILLARY: Glucose-Capillary: 87 mg/dL (ref 70–99)

## 2018-11-27 SURGERY — ESOPHAGOGASTRODUODENOSCOPY (EGD) WITH PROPOFOL
Anesthesia: General

## 2018-11-27 MED ORDER — PROPOFOL 10 MG/ML IV BOLUS
INTRAVENOUS | Status: DC | PRN
  Administered 2018-11-27: 80 mg via INTRAVENOUS

## 2018-11-27 MED ORDER — PROPOFOL 500 MG/50ML IV EMUL
INTRAVENOUS | Status: DC | PRN
  Administered 2018-11-27: 120 ug/kg/min via INTRAVENOUS

## 2018-11-27 MED ORDER — SODIUM CHLORIDE 0.9 % IV SOLN
INTRAVENOUS | Status: DC
  Administered 2018-11-27: 10:00:00 via INTRAVENOUS

## 2018-11-27 MED ORDER — PROPOFOL 500 MG/50ML IV EMUL
INTRAVENOUS | Status: AC
  Filled 2018-11-27: qty 50

## 2018-11-27 MED ORDER — MIDAZOLAM HCL 2 MG/2ML IJ SOLN
INTRAMUSCULAR | Status: DC | PRN
  Administered 2018-11-27: 2 mg via INTRAVENOUS

## 2018-11-27 MED ORDER — MIDAZOLAM HCL 2 MG/2ML IJ SOLN
INTRAMUSCULAR | Status: AC
  Filled 2018-11-27: qty 2

## 2018-11-27 MED ORDER — LIDOCAINE HCL (PF) 2 % IJ SOLN
INTRAMUSCULAR | Status: AC
  Filled 2018-11-27: qty 10

## 2018-11-27 MED ORDER — LIDOCAINE HCL (CARDIAC) PF 100 MG/5ML IV SOSY
PREFILLED_SYRINGE | INTRAVENOUS | Status: DC | PRN
  Administered 2018-11-27: 50 mg via INTRAVENOUS

## 2018-11-27 NOTE — Transfer of Care (Signed)
Immediate Anesthesia Transfer of Care Note  Patient: [name] [name]  Procedure(s) Performed: ESOPHAGOGASTRODUODENOSCOPY (EGD) WITH PROPOFOL (N/A ) COLONOSCOPY WITH PROPOFOL (N/A )  Patient Location: PACU and Endoscopy Unit  Anesthesia Type:General  Level of Consciousness: drowsy  Airway & Oxygen Therapy: Patient Spontanous Breathing and Patient connected to nasal cannula oxygen  Post-op Assessment: Report given to RN and Post -op Vital signs reviewed and stable  Post vital signs: Reviewed and stable  Last Vitals:  Vitals Value Taken Time  BP 96/73 11/27/18 1024  Temp    Pulse 72 11/27/18 1024  Resp 14 11/27/18 1024  SpO2 100 % 11/27/18 1024  Vitals shown include unvalidated device data.  Last Pain:  Vitals:   11/27/18 0925  TempSrc: Tympanic         Complications: No apparent anesthesia complications

## 2018-11-27 NOTE — Anesthesia Postprocedure Evaluation (Signed)
Anesthesia Post Note  Patient: [name] [name]  Procedure(s) Performed: ESOPHAGOGASTRODUODENOSCOPY (EGD) WITH PROPOFOL (N/A ) COLONOSCOPY WITH PROPOFOL (N/A )  Patient location during evaluation: Endoscopy Anesthesia Type: General Level of consciousness: awake and alert Pain management: pain level controlled Vital Signs Assessment: post-procedure vital signs reviewed and stable Respiratory status: spontaneous breathing, nonlabored ventilation, respiratory function stable and patient connected to nasal cannula oxygen Cardiovascular status: blood pressure returned to baseline and stable Postop Assessment: no apparent nausea or vomiting Anesthetic complications: no     Last Vitals:  Vitals:   11/27/18 0925 11/27/18 1025  BP: (!) 122/91   Pulse: 87   Resp: 16   Temp: (!) 36.2 C (!) 36.1 C  SpO2: 99%     Last Pain:  Vitals:   11/27/18 1055  TempSrc:   PainSc: 0-No pain                 Martha Clan

## 2018-11-27 NOTE — Interval H&P Note (Signed)
History and Physical Interval Note:  11/27/2018 9:54 AM  [name] [name]  has presented today for surgery, with the diagnosis of ida.  The various methods of treatment have been discussed with the patient and family. After consideration of risks, benefits and other options for treatment, the patient has consented to  Procedure(s): ESOPHAGOGASTRODUODENOSCOPY (EGD) WITH PROPOFOL (N/A) COLONOSCOPY WITH PROPOFOL (N/A) as a surgical intervention.  The patient's history has been reviewed, patient examined, no change in status, stable for surgery.  I have reviewed the patient's chart and labs.  Questions were answered to the patient's satisfaction.     [place], [place]

## 2018-11-27 NOTE — Anesthesia Preprocedure Evaluation (Signed)
Anesthesia Evaluation  Patient identified by MRN, date of birth, ID band Patient awake    Reviewed: Allergy & Precautions, NPO status , Patient's Chart, lab work & pertinent test results  History of Anesthesia Complications Negative for: history of anesthetic complications  Airway Mallampati: II  TM Distance: >3 FB     Dental  (+) Poor Dentition, Chipped   Pulmonary shortness of breath, neg recent URI, Current Smoker,    Pulmonary exam normal        Cardiovascular hypertension, Pt. on medications (-) angina(-) Past MI Normal cardiovascular exam(-) Valvular Problems/Murmurs     Neuro/Psych PSYCHIATRIC DISORDERS Anxiety Depression CVA    GI/Hepatic negative GI ROS,   Endo/Other  diabetes, Well Controlled, Type 2, Oral Hypoglycemic Agents  Renal/GU negative Renal ROS Bladder dysfunction      Musculoskeletal  (+) Arthritis , Osteoarthritis,    Abdominal Normal abdominal exam  (+)   Peds negative pediatric ROS (+)  Hematology negative hematology ROS (+)   Anesthesia Other Findings Past Medical History: No date: Anxiety No date: Arthritis No date: CVA (cerebral vascular accident) ([place]) No date: Depression No date: Diabetes mellitus without complication (HCC) No date: Dyspnea No date: Hypertension No date: Incontinence 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 2004: Stroke ([place])     Comment:  x 2   Reproductive/Obstetrics negative OB ROS                             Anesthesia Physical  Anesthesia Plan  ASA: III  Anesthesia Plan: General   Post-op Pain Management:  Regional for Post-op pain   Induction: Intravenous  PONV Risk Score and Plan: 2 and Propofol infusion and TIVA  Airway Management Planned: Natural Airway and Nasal Cannula  Additional Equipment:   Intra-op Plan:   Post-operative Plan:   Informed Consent: I have reviewed the patients History and Physical, chart, labs  and discussed the procedure including the risks, benefits and alternatives for the proposed anesthesia with the patient or authorized representative who has indicated his/her understanding and acceptance.     Dental advisory given  Plan Discussed with: CRNA and Surgeon  Anesthesia Plan Comments:         Anesthesia Quick Evaluation

## 2018-11-27 NOTE — Anesthesia Post-op Follow-up Note (Signed)
Anesthesia QCDR form completed.        

## 2018-11-27 NOTE — Op Note (Signed)
[facility] Gastroenterology Patient Name: [name] PerchesJennifer [name] Procedure Date: 11/27/2018 9:45 AM MRN: [id number] Account #: [id number] Date of Birth: 1965-03-31 Admit Type: Outpatient Age: [age] [facility] Gender: Female Note Status: Finalized Procedure:            Upper GI endoscopy Indications:          Iron deficiency anemia secondary to chronic blood loss Providers:            Boykin Nearingeodoro K. Dorance Spink MD, MD Medicines:            Propofol per Anesthesia Complications:        No immediate complications. Procedure:            Pre-Anesthesia Assessment:                       - The risks and benefits of the procedure and the                        sedation options and risks were discussed with the                        patient. All questions were answered and informed                        consent was obtained.                       - Patient identification and proposed procedure were                        verified prior to the procedure by the nurse. The                        procedure was verified in the procedure room.                       - ASA Grade Assessment: III - A patient with severe                        systemic disease.                       - After reviewing the risks and benefits, the patient                        was deemed in satisfactory condition to undergo the                        procedure.                       After obtaining informed consent, the endoscope was                        passed under direct vision. Throughout the procedure,                        the patient's blood pressure, pulse, and oxygen                        saturations were monitored continuously. The Endoscope  was introduced through the mouth, and advanced to the                        third part of duodenum. The upper GI endoscopy was                        accomplished without difficulty. The patient tolerated                         the procedure well. Findings:      The esophagus was normal.      A 1 cm hiatal hernia was present.      The examined duodenum was normal.      The exam was otherwise without abnormality. Impression:           - Normal esophagus.                       - 1 cm hiatal hernia.                       - Normal examined duodenum.                       - The examination was otherwise normal.                       - No specimens collected. Recommendation:       - Patient has a contact number available for                        emergencies. The signs and symptoms of potential                        delayed complications were discussed with the patient.                        Return to normal activities tomorrow. Written discharge                        instructions were provided to the patient.                       - Resume previous diet.                       - Continue present medications.                       - Colonoscopy was NOT performed today due to patient                        NOT having completed her prep.                       Reschedule colonoscopy when feasible with two day clear                        liquids and Golytely prep. Procedure Code(s):    --- Professional ---                       [phone], Esophagogastroduodenoscopy, flexible, transoral;  diagnostic, including collection of specimen(s) by                        brushing or washing, when performed (separate procedure) Diagnosis Code(s):    --- Professional ---                       D50.0, Iron deficiency anemia secondary to blood loss                        (chronic)                       K44.9, Diaphragmatic hernia without obstruction or                        gangrene CPT copyright 2019 American Medical Association. All rights reserved. The codes documented in this report are preliminary and upon coder review may  be revised to meet current compliance requirements. Stanton Kidney MD,  MD 11/27/2018 10:23:22 AM This report has been signed electronically. Number of Addenda: 0 Note Initiated On: 11/27/2018 9:45 AM Estimated Blood Loss: Estimated blood loss: none.      [facility]

## 2018-11-27 NOTE — Progress Notes (Signed)
Reviewed discharge instructions over the telephone with Ms. Neoma Laming [phone]) who is in charge at the family care home where the patient resides. Her telephone number was provided by Mr. Garnet Sierras who will be transporting Ms. Royse back to the family care home. Ms. Neoma Laming was also notified that Dr. Alice Reichert will be touching base with her as well.

## 2018-11-27 NOTE — H&P (Signed)
Outpatient short stay form Pre-procedure 11/27/2018 8:46 AM [name] [name], M.D.  Primary Physician: N/A  Reason for visit:  Iron deficiency anemia, occult gi bleeding  History of present illness:  As above. Patient denies change in bowel habits, rectal bleeding, weight loss or abdominal pain. Patient has mild GERD controlled with PPI. NO dysphagia or weight loss.    No current facility-administered medications for this encounter.   Current Outpatient Medications:  .  albuterol (PROVENTIL HFA;VENTOLIN HFA) 108 (90 Base) MCG/ACT inhaler, Inhale 2 puffs into the lungs every 4 (four) hours as needed for wheezing or shortness of breath., Disp: , Rfl:  .  amLODipine (NORVASC) 5 MG tablet, Take 5 mg by mouth daily. (0800), Disp: , Rfl:  .  amLODipine (NORVASC) 5 MG tablet, Take 5 mg by mouth every morning. , Disp: , Rfl:  .  aspirin 81 MG chewable tablet, Chew 1 tablet (81 mg total) by mouth 2 (two) times daily., Disp: 60 tablet, Rfl: 0 .  atorvastatin (LIPITOR) 40 MG tablet, Take 40 mg by mouth daily at 8 pm. (2000), Disp: , Rfl:  .  atorvastatin (LIPITOR) 40 MG tablet, Take 40 mg by mouth every evening., Disp: , Rfl:  .  buPROPion (WELLBUTRIN XL) 150 MG 24 hr tablet, Take 150 mg by mouth daily. (0800), Disp: , Rfl: 2 .  busPIRone (BUSPAR) 10 MG tablet, Take 10 mg by mouth 3 (three) times daily., Disp: , Rfl:  .  cholecalciferol (VITAMIN D) 1000 units tablet, Take 1,000 Units by mouth daily. (0800), Disp: , Rfl: 5 .  cholecalciferol (VITAMIN D3) 25 MCG (1000 UT) tablet, Take 1,000 Units by mouth every morning., Disp: , Rfl:  .  diclofenac sodium (VOLTAREN) 1 % GEL, Apply 2 g topically 4 (four) times daily as needed., Disp: 50 g, Rfl: 0 .  docusate sodium (COLACE) 100 MG capsule, Take 1 capsule (100 mg total) by mouth 2 (two) times daily., Disp: 10 capsule, Rfl: 0 .  ferrous sulfate 325 (65 FE) MG tablet, Take 1 tablet (325 mg total) by mouth 2 (two) times daily with a meal., Disp: 60 tablet,  Rfl: 0 .  Fluticasone-Salmeterol (ADVAIR) 100-50 MCG/DOSE AEPB, Inhale 1 puff into the lungs 2 (two) times daily., Disp: , Rfl:  .  Fluticasone-Salmeterol (ADVAIR) 250-50 MCG/DOSE AEPB, Inhale 1 puff into the lungs 2 (two) times daily., Disp: , Rfl:  .  furosemide (LASIX) 20 MG tablet, Take 20 mg by mouth daily. (0800), Disp: , Rfl: 2 .  furosemide (LASIX) 20 MG tablet, Take 20 mg by mouth 2 (two) times daily., Disp: , Rfl:  .  HYDROcodone-acetaminophen (NORCO/VICODIN) 5-325 MG tablet, Take 1-2 tablets by mouth every 4 (four) hours as needed for moderate pain (pain score 4-6)., Disp: 30 tablet, Rfl: 0 .  lisinopril (PRINIVIL,ZESTRIL) 20 MG tablet, Take 20 mg by mouth daily. (0800), Disp: , Rfl:  .  lisinopril (ZESTRIL) 20 MG tablet, Take 20 mg by mouth every morning. , Disp: , Rfl:  .  metFORMIN (GLUCOPHAGE-XR) 500 MG 24 hr tablet, Take 1,000 mg by mouth 2 (two) times daily. (0800 & 2000), Disp: , Rfl: 2 .  metFORMIN (GLUCOPHAGE-XR) 500 MG 24 hr tablet, Take 500 mg by mouth 2 (two) times a day. , Disp: , Rfl:  .  pantoprazole (PROTONIX) 40 MG tablet, Take 1 tablet (40 mg total) by mouth daily., Disp: 30 tablet, Rfl: 0 .  potassium chloride (K-DUR) 10 MEQ tablet, Take 10 mEq by mouth every morning., Disp: ,  Rfl:  .  traZODone (DESYREL) 100 MG tablet, Take 100 mg by mouth at bedtime., Disp: , Rfl:   No medications prior to admission.     Allergies  Allergen Reactions  . Latex Rash    Severe itching.     Past Medical History:  Diagnosis Date  . Anxiety   . Arthritis   . CVA (cerebral vascular accident) (HCC)   . Depression   . Diabetes mellitus without complication (HCC)   . Dyspnea   . Hypertension   . Incontinence   . Incontinence of urine   . Stroke [facility]) 2004   x 2    Review of systems:  Otherwise negative.    Physical Exam  Gen: Alert, oriented. Appears stated age.  HEENT: [place]/AT. PERRLA. Lungs: CTA, no wheezes. CV: RR nl S1, S2. Abd: soft, benign, no masses.  BS+ Ext: No edema. Pulses 2+    Planned procedures: Proceed with EGD and colonoscopy. The patient understands the nature of the planned procedure, indications, risks, alternatives and potential complications including but not limited to bleeding, infection, perforation, damage to internal organs and possible oversedation/side effects from anesthesia. The patient agrees and gives consent to proceed.  Please refer to procedure notes for findings, recommendations and patient disposition/instructions.     [name] [name], M.D. Gastroenterology 11/27/2018  8:46 AM

## 2018-11-28 ENCOUNTER — Encounter: Payer: Self-pay | Admitting: Internal Medicine

## 2019-05-29 ENCOUNTER — Ambulatory Visit: Admission: RE | Admit: 2019-05-29 | Payer: Medicaid Other | Source: Home / Self Care | Admitting: Internal Medicine

## 2019-05-29 ENCOUNTER — Encounter: Admission: RE | Payer: Self-pay | Source: Home / Self Care

## 2019-05-29 SURGERY — COLONOSCOPY WITH PROPOFOL
Anesthesia: General

## 2019-10-20 ENCOUNTER — Inpatient Hospital Stay (HOSPITAL_COMMUNITY)
Admission: EM | Admit: 2019-10-20 | Discharge: 2019-10-22 | DRG: 069 | Disposition: A | Discharge status: Home / Self Care | Payer: Medicaid Other | Attending: Internal Medicine | Admitting: Internal Medicine

## 2019-10-20 ENCOUNTER — Emergency Department (HOSPITAL_COMMUNITY): Payer: Medicaid Other

## 2019-10-20 ENCOUNTER — Other Ambulatory Visit: Payer: Self-pay

## 2019-10-20 ENCOUNTER — Encounter (HOSPITAL_COMMUNITY): Payer: Self-pay

## 2019-10-20 DIAGNOSIS — I10 Essential (primary) hypertension: Secondary | ICD-10-CM

## 2019-10-20 DIAGNOSIS — N179 Acute kidney failure, unspecified: Secondary | ICD-10-CM | POA: Diagnosis present

## 2019-10-20 DIAGNOSIS — E1122 Type 2 diabetes mellitus with diabetic chronic kidney disease: Secondary | ICD-10-CM | POA: Diagnosis present

## 2019-10-20 DIAGNOSIS — N1832 Chronic kidney disease, stage 3b: Secondary | ICD-10-CM | POA: Diagnosis present

## 2019-10-20 DIAGNOSIS — M1990 Unspecified osteoarthritis, unspecified site: Secondary | ICD-10-CM | POA: Diagnosis present

## 2019-10-20 DIAGNOSIS — F17210 Nicotine dependence, cigarettes, uncomplicated: Secondary | ICD-10-CM | POA: Diagnosis present

## 2019-10-20 DIAGNOSIS — Z7982 Long term (current) use of aspirin: Secondary | ICD-10-CM | POA: Diagnosis not present

## 2019-10-20 DIAGNOSIS — Z7951 Long term (current) use of inhaled steroids: Secondary | ICD-10-CM | POA: Diagnosis not present

## 2019-10-20 DIAGNOSIS — F418 Other specified anxiety disorders: Secondary | ICD-10-CM

## 2019-10-20 DIAGNOSIS — R29700 NIHSS score 0: Secondary | ICD-10-CM | POA: Diagnosis present

## 2019-10-20 DIAGNOSIS — R209 Unspecified disturbances of skin sensation: Secondary | ICD-10-CM | POA: Diagnosis not present

## 2019-10-20 DIAGNOSIS — Z8673 Personal history of transient ischemic attack (TIA), and cerebral infarction without residual deficits: Secondary | ICD-10-CM

## 2019-10-20 DIAGNOSIS — J449 Chronic obstructive pulmonary disease, unspecified: Secondary | ICD-10-CM

## 2019-10-20 DIAGNOSIS — Z720 Tobacco use: Secondary | ICD-10-CM

## 2019-10-20 DIAGNOSIS — Z7984 Long term (current) use of oral hypoglycemic drugs: Secondary | ICD-10-CM

## 2019-10-20 DIAGNOSIS — I129 Hypertensive chronic kidney disease with stage 1 through stage 4 chronic kidney disease, or unspecified chronic kidney disease: Secondary | ICD-10-CM | POA: Diagnosis present

## 2019-10-20 DIAGNOSIS — Z8249 Family history of ischemic heart disease and other diseases of the circulatory system: Secondary | ICD-10-CM | POA: Diagnosis not present

## 2019-10-20 DIAGNOSIS — E869 Volume depletion, unspecified: Secondary | ICD-10-CM | POA: Diagnosis present

## 2019-10-20 DIAGNOSIS — G459 Transient cerebral ischemic attack, unspecified: Secondary | ICD-10-CM | POA: Diagnosis present

## 2019-10-20 DIAGNOSIS — E785 Hyperlipidemia, unspecified: Secondary | ICD-10-CM | POA: Diagnosis present

## 2019-10-20 DIAGNOSIS — Z20822 Contact with and (suspected) exposure to covid-19: Secondary | ICD-10-CM | POA: Diagnosis present

## 2019-10-20 DIAGNOSIS — Z91040 Latex allergy status: Secondary | ICD-10-CM

## 2019-10-20 DIAGNOSIS — R531 Weakness: Secondary | ICD-10-CM | POA: Diagnosis present

## 2019-10-20 HISTORY — DX: Acute cystitis without hematuria: N30.00

## 2019-10-20 HISTORY — DX: Full incontinence of feces: R15.9

## 2019-10-20 HISTORY — DX: Unspecified urinary incontinence: R32

## 2019-10-20 LAB — COMPREHENSIVE METABOLIC PANEL
ALT: 9 U/L (ref 0–44)
AST: 18 U/L (ref 15–41)
Albumin: 3.9 g/dL (ref 3.5–5.0)
Alkaline Phosphatase: 56 U/L (ref 38–126)
Anion gap: 15 (ref 5–15)
BUN: 46 mg/dL — ABNORMAL HIGH (ref 6–20)
CO2: 21 mmol/L — ABNORMAL LOW (ref 22–32)
Calcium: 8.5 mg/dL — ABNORMAL LOW (ref 8.9–10.3)
Chloride: 100 mmol/L (ref 98–111)
Creatinine, Ser: 2.59 mg/dL — ABNORMAL HIGH (ref 0.44–1.00)
GFR calc Af Amer: 24 mL/min — ABNORMAL LOW (ref >60)
GFR calc non Af Amer: 20 mL/min — ABNORMAL LOW (ref >60)
Glucose, Bld: 82 mg/dL (ref 70–99)
Potassium: 4.5 mmol/L (ref 3.5–5.1)
Sodium: 136 mmol/L (ref 135–145)
Total Bilirubin: 0.5 mg/dL (ref 0.3–1.2)
Total Protein: 7.9 g/dL (ref 6.5–8.1)

## 2019-10-20 LAB — DIFFERENTIAL
Abs Immature Granulocytes: 0.02 10*3/uL (ref 0.00–0.07)
Basophils Absolute: 0 10*3/uL (ref 0.0–0.1)
Basophils Relative: 0 %
Eosinophils Absolute: 0.2 10*3/uL (ref 0.0–0.5)
Eosinophils Relative: 2 %
Immature Granulocytes: 0 %
Lymphocytes Relative: 29 %
Lymphs Abs: 2.3 10*3/uL (ref 0.7–4.0)
Monocytes Absolute: 0.6 10*3/uL (ref 0.1–1.0)
Monocytes Relative: 8 %
Neutro Abs: 4.7 10*3/uL (ref 1.7–7.7)
Neutrophils Relative %: 61 %

## 2019-10-20 LAB — CBC
HCT: 34.2 % — ABNORMAL LOW (ref 36.0–46.0)
Hemoglobin: 10.8 g/dL — ABNORMAL LOW (ref 12.0–15.0)
MCH: 31.2 pg (ref 26.0–34.0)
MCHC: 31.6 g/dL (ref 30.0–36.0)
MCV: 98.8 fL (ref 80.0–100.0)
Platelets: 307 10*3/uL (ref 150–400)
RBC: 3.46 MIL/uL — ABNORMAL LOW (ref 3.87–5.11)
RDW: 12.4 % (ref 11.5–15.5)
WBC: 7.8 10*3/uL (ref 4.0–10.5)
nRBC: 0 % (ref 0.0–0.2)

## 2019-10-20 LAB — CBG MONITORING, ED
Glucose-Capillary: 72 mg/dL (ref 70–99)
Glucose-Capillary: 77 mg/dL (ref 70–99)

## 2019-10-20 LAB — APTT: aPTT: 23 seconds — ABNORMAL LOW (ref 24–36)

## 2019-10-20 LAB — PROTIME-INR
INR: 1 (ref 0.8–1.2)
Prothrombin Time: 12.3 seconds (ref 11.4–15.2)

## 2019-10-20 LAB — GLUCOSE, CAPILLARY: Glucose-Capillary: 124 mg/dL — ABNORMAL HIGH (ref 70–99)

## 2019-10-20 LAB — SARS CORONAVIRUS 2 BY RT PCR (HOSPITAL ORDER, PERFORMED IN ~~LOC~~ HOSPITAL LAB): SARS Coronavirus 2: NEGATIVE

## 2019-10-20 MED ORDER — BUSPIRONE HCL 5 MG PO TABS
10.0000 mg | ORAL_TABLET | Freq: Three times a day (TID) | ORAL | Status: DC
  Administered 2019-10-21 – 2019-10-22 (×4): 10 mg via ORAL
  Filled 2019-10-20 (×4): qty 2

## 2019-10-20 MED ORDER — ONDANSETRON HCL 4 MG PO TABS: 4.0000 mg | ORAL_TABLET | Freq: Four times a day (QID) | ORAL | Status: DC | PRN

## 2019-10-20 MED ORDER — TRAZODONE HCL 50 MG PO TABS
100.0000 mg | ORAL_TABLET | Freq: Every day | ORAL | Status: DC
  Administered 2019-10-20 – 2019-10-21 (×2): 100 mg via ORAL
  Filled 2019-10-20 (×2): qty 2

## 2019-10-20 MED ORDER — PANTOPRAZOLE SODIUM 40 MG PO TBEC
40.0000 mg | DELAYED_RELEASE_TABLET | Freq: Every day | ORAL | Status: DC
  Administered 2019-10-21 – 2019-10-22 (×2): 40 mg via ORAL
  Filled 2019-10-20 (×2): qty 1

## 2019-10-20 MED ORDER — DOCUSATE SODIUM 100 MG PO CAPS
100.0000 mg | ORAL_CAPSULE | Freq: Two times a day (BID) | ORAL | Status: DC
  Administered 2019-10-21 – 2019-10-22 (×3): 100 mg via ORAL
  Filled 2019-10-20 (×3): qty 1

## 2019-10-20 MED ORDER — ASPIRIN EC 81 MG PO TBEC: 81.0000 mg | DELAYED_RELEASE_TABLET | Freq: Every day | ORAL | Status: DC

## 2019-10-20 MED ORDER — SODIUM CHLORIDE 0.9 % IV BOLUS
500.0000 mL | Freq: Once | INTRAVENOUS | Status: AC
  Administered 2019-10-20: 500 mL via INTRAVENOUS

## 2019-10-20 MED ORDER — HYDROCODONE-ACETAMINOPHEN 5-325 MG PO TABS
1.0000 | ORAL_TABLET | Freq: Four times a day (QID) | ORAL | Status: DC | PRN
  Administered 2019-10-21 – 2019-10-22 (×2): 1 via ORAL
  Filled 2019-10-20 (×2): qty 1

## 2019-10-20 MED ORDER — HEPARIN SODIUM (PORCINE) 5000 UNIT/ML IJ SOLN
5000.0000 [IU] | Freq: Three times a day (TID) | INTRAMUSCULAR | Status: DC
  Administered 2019-10-21 – 2019-10-22 (×4): 5000 [IU] via SUBCUTANEOUS
  Filled 2019-10-20 (×4): qty 1

## 2019-10-20 MED ORDER — MOMETASONE FURO-FORMOTEROL FUM 100-5 MCG/ACT IN AERO
2.0000 | INHALATION_SPRAY | Freq: Two times a day (BID) | RESPIRATORY_TRACT | Status: DC
  Administered 2019-10-20 – 2019-10-22 (×4): 2 via RESPIRATORY_TRACT

## 2019-10-20 MED ORDER — ONDANSETRON HCL 4 MG/2ML IJ SOLN: 4.0000 mg | Freq: Four times a day (QID) | INTRAMUSCULAR | Status: DC | PRN

## 2019-10-20 MED ORDER — NICOTINE 21 MG/24HR TD PT24
21.0000 mg | MEDICATED_PATCH | Freq: Every day | TRANSDERMAL | Status: DC
  Administered 2019-10-21 – 2019-10-22 (×2): 21 mg via TRANSDERMAL
  Filled 2019-10-20 (×2): qty 1

## 2019-10-20 MED ORDER — INSULIN ASPART 100 UNIT/ML ~~LOC~~ SOLN
0.0000 [IU] | Freq: Three times a day (TID) | SUBCUTANEOUS | Status: DC
  Administered 2019-10-21 – 2019-10-22 (×2): 1 [IU] via SUBCUTANEOUS

## 2019-10-20 MED ORDER — SODIUM CHLORIDE 0.9 % IV SOLN: INTRAVENOUS | Status: DC

## 2019-10-20 MED ORDER — VITAMIN D 25 MCG (1000 UNIT) PO TABS
1000.0000 [IU] | ORAL_TABLET | Freq: Every day | ORAL | Status: DC
  Administered 2019-10-21 – 2019-10-22 (×2): 1000 [IU] via ORAL
  Filled 2019-10-20 (×2): qty 1

## 2019-10-20 MED ORDER — SODIUM CHLORIDE 0.9% FLUSH
3.0000 mL | Freq: Once | INTRAVENOUS | Status: DC
Start: 2019-10-20 — End: 2019-10-22

## 2019-10-20 MED ORDER — BUPROPION HCL ER (XL) 150 MG PO TB24: 150.0000 mg | ORAL_TABLET | Freq: Every day | ORAL | Status: DC

## 2019-10-20 MED ORDER — ALBUTEROL SULFATE (2.5 MG/3ML) 0.083% IN NEBU: 3.0000 mL | INHALATION_SOLUTION | RESPIRATORY_TRACT | Status: DC | PRN

## 2019-10-20 MED ORDER — ASPIRIN 81 MG PO CHEW: 81.0000 mg | CHEWABLE_TABLET | Freq: Two times a day (BID) | ORAL | Status: DC

## 2019-10-20 MED ORDER — ATORVASTATIN CALCIUM 40 MG PO TABS
40.0000 mg | ORAL_TABLET | Freq: Every day | ORAL | Status: DC
  Administered 2019-10-21: 40 mg via ORAL
  Filled 2019-10-20: qty 1

## 2019-10-20 MED ORDER — ASPIRIN EC 81 MG PO TBEC
81.0000 mg | DELAYED_RELEASE_TABLET | Freq: Every day | ORAL | Status: DC
  Administered 2019-10-21: 81 mg via ORAL
  Filled 2019-10-20: qty 1

## 2019-10-20 MED ORDER — STROKE: EARLY STAGES OF RECOVERY BOOK
Freq: Once | Status: AC
  Filled 2019-10-20: qty 1

## 2019-10-20 NOTE — ED Notes (Signed)
Pt transported to CT ?

## 2019-10-20 NOTE — H&P (Signed)
Admission H&P    Chief Complaint: Patient was referred from group home as a case of code stroke on account of new onset right hand weakness.  HPI: [name] [name] is an 54 y.o. female with medical history significant for CVA with no residual weakness.  She is a poor historian and lives in a group home.  She was referred to the hospital today on account of new onset right-handed weakness.  Patient apparently had tried using her right arm and was noted to have weakness.  The symptoms are recurrent and have occurred in the past.  By the time patient arrived in the ED, symptoms had resolved and patient declined  TPA.  She was seen and evaluated by neurology in the ED who agreed for patient to be admitted for further monitoring and evaluation.  Patient denied any preceding aura.  Denied any seizure-like activity.  She denied any fever, chills or any symptoms of URI symptoms.  She is up-to-date with her Covid vaccination.  In the ED, work-up did reveal evidence of acute kidney injury from baseline.  She was noted to have labile blood pressure.NIHHS score was 0 on admission.  History of Stroke: Yes  Date last known well:10/20/2019 Time last known well:1630 tPA Given:NO MRankin:-  Past Medical History:  Diagnosis Date  . Acute cystitis   . Anxiety   . Arthritis   . CVA (cerebral vascular accident) (HCC)   . Depression   . Diabetes mellitus without complication (HCC)   . Dyspnea   . Fecal incontinence   . Hypertension   . Incontinence   . Incontinence of urine   . Stroke (HCC) 2004   x 2  . Urinary incontinence     Past Surgical History:  Procedure Laterality Date  . COLONOSCOPY WITH PROPOFOL N/A 11/27/2018   Procedure: COLONOSCOPY WITH PROPOFOL;  Surgeon: Toledo, Boykin Nearingeodoro K, MD;  Location: [facility] ENDOSCOPY;  Service: Gastroenterology;  Laterality: N/A;  . ESOPHAGOGASTRODUODENOSCOPY (EGD) WITH PROPOFOL N/A 11/27/2018   Procedure: ESOPHAGOGASTRODUODENOSCOPY (EGD) WITH PROPOFOL;  Surgeon:  Toledo, Boykin Nearingeodoro K, MD;  Location: [facility] ENDOSCOPY;  Service: Gastroenterology;  Laterality: N/A;  . NO PAST SURGERIES    . TOTAL KNEE ARTHROPLASTY Right 10/03/2017   Procedure: TOTAL KNEE ARTHROPLASTY;  Surgeon: Lyndle HerrlichBowers, James R, MD;  Location: [facility] ORS;  Service: Orthopedics;  Laterality: Right;    Family History  Problem Relation Age of Onset  . Heart disease Mother   . Heart disease Father    Social History:  reports that she has been smoking cigarettes. She has never used smokeless tobacco. She reports that she does not drink alcohol and does not use drugs.  Allergies:  Allergies  Allergen Reactions  . Latex Rash    Severe itching.    (Not in a hospital admission)   ROS: 12 point system reviewed were essentially unremarkable except for that mentioned in HPI.  Physical Examination: Blood pressure 124/80, pulse 92, temperature 98.6 F (37 C), temperature source Oral, resp. rate 17, height 5\' 8"  (1.727 m), SpO2 100 %.  HEENT-  Normocephalic, no lesions, without obvious abnormality.  Normal external eye and conjunctiva.  Normal TM's bilaterally.  Normal auditory canals and external ears. Normal external nose, mucus membranes and septum.  Normal pharynx. Neck supple with no masses, nodes, nodules or enlargement. Cardiovascular -regular S1-S2 with no murmur. Lungs -clinically clear lung fields. Abdomen -soft, nontender, bowel sounds present and normal. Extremities -obvious pedal edema. Neurologic Examination: No evidence of pronator drift.  No focal deficits  appreciated, no sensory deficits appreciated at this time.  Continue K full usage shortly  Results for orders placed or performed during the hospital encounter of 10/20/19 (from the past 48 hour(s))  Protime-INR     Status: None   Collection Time: 10/20/19  6:00 PM  Result Value Ref Range   Prothrombin Time 12.3 11.4 - 15.2 seconds   INR 1.0 0.8 - 1.2    Comment: (NOTE) INR goal varies based on device and disease  states. Performed at [facility], [address], [place], [place] [address]   APTT     Status: Abnormal   Collection Time: 10/20/19  6:00 PM  Result Value Ref Range   aPTT 23 (L) 24 - 36 seconds    Comment: Performed at [facility], [address] [place], [place] [address]  CBC     Status: Abnormal   Collection Time: 10/20/19  6:00 PM  Result Value Ref Range   WBC 7.8 4.0 - 10.5 K/uL   RBC 3.46 (L) 3.87 - 5.11 MIL/uL   Hemoglobin 10.8 (L) 12.0 - 15.0 g/dL   HCT 29.4 (L) 36 - 46 %   MCV 98.8 80.0 - 100.0 fL   MCH 31.2 26.0 - 34.0 pg   MCHC 31.6 30.0 - 36.0 g/dL   RDW 76.5 46.5 - 03.5 %   Platelets 307 150 - 400 K/uL   nRBC 0.0 0.0 - 0.2 %    Comment: Performed at [facility], [address], [place], [place] [address]  Differential     Status: None   Collection Time: 10/20/19  6:00 PM  Result Value Ref Range   Neutrophils Relative % 61 %   Neutro Abs 4.7 1.7 - 7.7 K/uL   Lymphocytes Relative 29 %   Lymphs Abs 2.3 0.7 - 4.0 K/uL   Monocytes Relative 8 %   Monocytes Absolute 0.6 0 - 1 K/uL   Eosinophils Relative 2 %   Eosinophils Absolute 0.2 0 - 0 K/uL   Basophils Relative 0 %   Basophils Absolute 0.0 0 - 0 K/uL   Immature Granulocytes 0 %   Abs Immature Granulocytes 0.02 0.00 - 0.07 K/uL    Comment: Performed at [facility], [address] [place], [place] [address]  Comprehensive metabolic panel     Status: Abnormal   Collection Time: 10/20/19  6:00 PM  Result Value Ref Range   Sodium 136 135 - 145 mmol/L   Potassium 4.5 3.5 - 5.1 mmol/L   Chloride 100 98 - 111 mmol/L   CO2 21 (L) 22 - 32 mmol/L   Glucose, Bld 82 70 - 99 mg/dL    Comment: Glucose reference range applies only to samples taken after fasting for at least 8 hours.   BUN 46 (H) 6 - 20 mg/dL   Creatinine, Ser 7.00 (H) 0.44 - 1.00 mg/dL   Calcium 8.5 (L) 8.9 - 10.3 mg/dL   Total Protein 7.9 6.5 - 8.1 g/dL   Albumin 3.9 3.5 - 5.0 g/dL   AST 18 15 - 41 U/L   ALT 9 0 - 44 U/L   Alkaline Phosphatase 56  38 - 126 U/L   Total Bilirubin 0.5 0.3 - 1.2 mg/dL   GFR calc non Af Amer 20 (L) >60 mL/min   GFR calc Af Amer 24 (L) >60 mL/min   Anion gap 15 5 - 15    Comment: Performed at [facility], [address], [place], [place] [address]  CBG monitoring, ED  Status: None   Collection Time: 10/20/19  6:27 PM  Result Value Ref Range   Glucose-Capillary 72 70 - 99 mg/dL    Comment: Glucose reference range applies only to samples taken after fasting for at least 8 hours.  SARS Coronavirus 2 by RT PCR (hospital order, performed in [facility] hospital lab) Nasopharyngeal Nasopharyngeal Swab     Status: None   Collection Time: 10/20/19  7:18 PM   Specimen: Nasopharyngeal Swab  Result Value Ref Range   SARS Coronavirus 2 NEGATIVE NEGATIVE    Comment: (NOTE) SARS-CoV-2 target nucleic acids are NOT DETECTED.  The SARS-CoV-2 RNA is generally detectable in upper and lower respiratory specimens during the acute phase of infection. The lowest concentration of SARS-CoV-2 viral copies this assay can detect is 250 copies / mL. A negative result does not preclude SARS-CoV-2 infection and should not be used as the sole basis for treatment or other patient management decisions.  A negative result may occur with improper specimen collection / handling, submission of specimen other than nasopharyngeal swab, presence of viral mutation(s) within the areas targeted by this assay, and inadequate number of viral copies (<250 copies / mL). A negative result must be combined with clinical observations, patient history, and epidemiological information.  Fact Sheet for Patients:   BoilerBrush.com.cy  Fact Sheet for Healthcare Providers: https://pope.com/  This test is not yet approved or  cleared by the [place] FDA and has been authorized for detection and/or diagnosis of SARS-CoV-2 by FDA under an Emergency Use Authorization (EUA).  This EUA will remain in  effect (meaning this test can be used) for the duration of the COVID-19 declaration under Section 564(b)(1) of the Act, 21 U.S.C. section 360bbb-3(b)(1), unless the authorization is terminated or revoked sooner.  Performed at [facility], [address], [place], [place] [address]    CT HEAD WO CONTRAST  Result Date: 10/20/2019 CLINICAL DATA:  New onset weakness. EXAM: CT HEAD WITHOUT CONTRAST TECHNIQUE: Contiguous axial images were obtained from the base of the skull through the vertex without intravenous contrast. COMPARISON:  September 07, 2018 FINDINGS: Brain: There is mild cerebral atrophy with widening of the extra-axial spaces and ventricular dilatation. There are areas of decreased attenuation within the white matter tracts of the supratentorial brain, consistent with microvascular disease changes. Small chronic bilateral basal ganglia lacunar infarcts are seen. Vascular: No hyperdense vessel or unexpected calcification. Skull: Normal. Negative for fracture or focal lesion. Sinuses/Orbits: There is a small right maxillary sinus air-fluid level. No acute osseous abnormalities are seen. Other: None. IMPRESSION: 1. Generalized cerebral atrophy. 2. No acute intracranial abnormality. Electronically Signed   By: Aram Candela M.D.   On: 10/20/2019 18:47   Assessment: 54 y.o. female with new onset right upper extremity weakness that resolved by the time, patient arrived here.  Denied any seizure-like activity.  By the time patient arrived, symptoms had resolved.  CT scan of the head was negative for any acute intracranial abnormality.  Patient declined TPA.  Patient was seen by neurology in the ED.  Patient was admitted for TIA/cardioembolic stroke.  Symptoms are resolved by the time, patient arrived.  Patient is on aspirin, statins. Consider dual antiplatelet due to recurrence of these symptoms  Echocardiogram pending.Consider dual antiplatelet due to recurrence of these symptoms  Stroke Risk  Factors -pack of smoking, history of stroke in the past.  Plan: 1. HgbA1c, fasting lipid panel 2. MRI, MRA  of the brain without contrast 3. PT consult, OT consult,  Speech consult 4. Echocardiogram 5. Carotid dopplers 6. Prophylactic therapy- Aspirin 7. Risk factor modification 7. Telemetry monitoring   #2.  Acute kidney injury: present on admission most likely secondary to acute dehydration.  Avoid nephrotoxic medication.  Renal ultrasound has been ordered and pending.  Obtain urinalysis, evaluate for microhematuria and proteinuria.  #3.  Chronic essential hypertension: Blood pressure is currently labile.  Will hold off on antihypertensive medication.  Permissive hypertension for now.  #4.  History of diabetes mellitus, type II.  Patient is on Metformin.  Hold Metformin.  Insulin sliding scale for blood glucose coverage.  Fingerstick glucose before every meal plus at bedtime.  Diabetic diet advised.  #5.  History of hyperlipidemia: Increased dose of statins to 80 mg daily.  Follow-up with lipid panel in a.m.  #6.  Tobacco dependence and abuse: Patient is on nicotine replacement therapy.  Tobacco cessation counseling was again reemphasized.  #7.  History of depression/anxiety.   Stroke education, treatment, and current plan discussed with patient/significant other: Yes  Lilia Pro 10/20/2019, 8:40 PM

## 2019-10-20 NOTE — ED Notes (Signed)
Teleneuro MD preforming assessment at this time.

## 2019-10-20 NOTE — Consult Note (Signed)
[facility] TeleNeurology Consult Services   TeleStroke Metrics: Last Known Well: 1630   [facility] Notification Time: 1816  Stamp Time: 1816 Telephone Response Time: 1829  Time First Login Attempt: 1859 Video Start Time: 1859 Video End Time: 1906   Arrival Time/Door Time: 1744  NIHSS Assessment Start Time: 1903  Thrombolytic Early Mix Decision Time: N/A Thrombolytic Medical Decision Time: 1906 Decision on Alteplase: Patient has declined IV alteplase administration due to concerns of internal bleeding, and overall she is back to her baseline.  She has opted for a more conservative approach. mRS: 3  Interventional Candidate: Not a candidate as her symptoms are not consistent with a large vessel proximal occlusion. Discussed with Neurointerventionalist at: Not applicable.   ED Physician notified of diagnostic impression and management plan at: 1907   Chief Complaint: Right hand weakness   HPI: Asked to see this patient in emergent telemedicine consultation utilizing interactive audio and video technologies. Consultation was performed with assistance of ancillary / medical staff at bedside. Verbal consent to perform the examination with telemedicine was obtained. Patient agreed to proceed with the consultation for acute stroke protocol.  54 year old right-handed African-American female who was brought from her group home with recurrent right hand weakness today.  It was reported that the patient has had 2 prior strokes before in the past.  She really does not remember her symptoms, but states she fully recovered from her previous strokes.  She also states she is not on any blood thinners or aspirin currently.  She is an active smoker.  Review of the medical record showed that she had an admission in July 2020 for altered mental status.  MRI brain was negative.  CTA head and neck were negative at that time.  Patient says she first started having symptoms around 7 AM this morning  when she dropped her cup with her right hand while eating breakfast.  She states it only lasted for about 30 minutes.  She states she was fine through most of the day.  There around 4:30 PM, she had another episode while at dinner.  This also resolved over time.  She denies any associated right leg weakness, right-sided numbness, chest pain, or headaches.  She states she currently feels fine again.  Head CT was negative.  I reviewed with the patient about the availability of IV alteplase.  I reviewed with her about some of the potential side effects of IV alteplase to include an approximate 4 to 6% risk of symptomatic intracranial hemorrhage, internal bleeding, and sinus or angioedema.  At the end of the day, patient has declined IV alteplase administration due to concerns of internal bleeding and overall she feels normal again.  She has opted for a more conservative approach.  PMH: Prior strokes x2, hypertension, diabetes mellitus, depression/anxiety, osteoarthritis   SOC: Positive for tobacco abuse.  Negative x2.  She lives in a group home.  She ambulates with a walker.   FMH: Negative for stroke.  Significant for heart disease.   ROS: 13 point review systems were reviewed with the patient, and are all negative with the exception of the aforementioned in the history of present illness.   VS: Temperature 98.6 F, pulse 90, respiration 14, blood pressure 122/89, oxygen saturation 100%   Exam: Patient is in no apparent distress.  Patient appears as stated age.  No obvious acute respiratory or cardiac distress.  Patient is well groomed and well-nourished. 1a- LOC: Keenly responsive - 0 1b- LOC questions: Answers both questions correctly -  0 1c- LOC commands- Performs both tasks correctly- 0 2- Gaze: Normal; no gaze paresis or gaze deviation - 0 3- Visual Fields: normal, no Visual field deficit - 0 4- Facial movements: no facial palsy - 0 5- Upper limb motor - no arm drift - 0 6- Lower limb motor  - no leg drift - 0 7- Limb Coordination: absent ataxia - 0 8- Sensory: no sensory loss - 0 9- Language - No aphasia - 0 10- Speech - No dysarthria -0 11- Neglect / Extinction - none found - 0 NIHSS score: 0   Diagnostic Data: CT head showed no acute intracranial hemorrhage, mass, or large territory stroke  Blood glucose 72 WBC 7.8, hemoglobin 10.8, platelets 307, coagulation studies within normal limits  Medical Data Reviewed: 1.Data?reviewed include clinical labs, radiology,?and medical tests; 2.Tests?results discussed w/performing or interpreting physician; 3.Obtaining/reviewing old medical records; 4.Obtaining?case history from another source; 5.Independent?review of image, tracing, or specimen.   Medical Decision Making: - Extensive number of diagnosis or management options are considered below. - Extensive amount of complex data reviewed. - High risk of complication and/or morbidity or mortality are associated with differential diagnostic considerations below. - There may be?uncertain?outcome and increased probability of prolonged functional impairment or high probability of severe prolonged functional impairment associated with some of these differential diagnosis.   Differential Diagnosis for Stroke: 1.?Cardioembolic?stroke 2. Small vessel disease/lacune 3. Thromboembolic, artery-to-artery mechanism 4.?Hypercoagulable?state-related infarct 5. Transient ischemic attack 6. Thrombotic mechanism, large artery disease   Assessment: 1.  Possible left MCA TIA/stroke 2.  Prior strokes x2 3.  Tobacco abuse 4.  Hypertension 5.  Diabetes mellitus 6.  Depression/anxiety 7.  Osteoarthritis  Recommendations: Patient can be admitted to the hospital for further work-up of her symptoms Metabolic and infectious work-up per primary team.  Will need to rule out any underlying subacute infectious process that could be causing recrudescence of prior stroke symptoms. Consult inpatient  neurology team to assist with evaluation and management Can start her back on a baby aspirin Allow permissive hypertension over the next 48 hours Check MRI brain with and without contrast to rule out any acute intracranial process or inflammatory process Check MRA of the head and neck to better evaluate her intracranial and extracranial blood vessels Check echocardiogram to gauge her cardiac function Maintain the patient on telemetry to look for paroxysmal atrial fibrillation Can also check an EEG to rule out subclinical seizures Check hemoglobin A1c, lipid panel, urine drug screen Consult PT, OT, and ST Continue supportive care Plan of care was discussed with the patient  Thank you for allowing [facility] to participate in the care of your patient. Please call me, Dr. Adrienne Mocha, with any questions at [phone]. Case discussed with the ER staff and Dr. Estell Harpin.   Critical Care notation:   I was called to see this critical patient emergently. I personally evaluated this critical patient for acute stroke evaluation, and determining their eligibility for IV Alteplase and interventional therapies.  I have spent approximately 7 minutes with the patient, including time at bedside, time discussing the case with other physicians, reviewing plan of care, and time independently reviewing the records and scans.

## 2019-10-20 NOTE — ED Triage Notes (Signed)
EMS reports pt is from [facility].  Reports around 4pm pt wasn't able to grip a cup with her R hand.  Reports around 4:05pm she was able to grip the cup.  PT presently alert and oriented per ems.

## 2019-10-20 NOTE — ED Provider Notes (Signed)
[facility] EMERGENCY DEPARTMENT Provider Note   CSN: [id number] Arrival date & time: 10/20/19  1744     History Chief Complaint  Patient presents with  . R sided weakness    [name] [name] is a 54 y.o. female.  Pt reports her right hand felt weak earlier. Pt states she is feeling better now.  Pt reports she has had a stroke in the past and this is similar.  Pt denies any other areas of weakness   The history is provided by the patient. No language interpreter was used.  Cerebrovascular Accident This is a new problem. The current episode started 1 to 2 hours ago. The problem occurs constantly. The problem has been resolved. Nothing aggravates the symptoms. Nothing relieves the symptoms. She has tried nothing for the symptoms. The treatment provided no relief.       Past Medical History:  Diagnosis Date  . Acute cystitis   . Anxiety   . Arthritis   . CVA (cerebral vascular accident) (HCC)   . Depression   . Diabetes mellitus without complication (HCC)   . Dyspnea   . Fecal incontinence   . Hypertension   . Incontinence   . Incontinence of urine   . Stroke (HCC) 2004   x 2  . Urinary incontinence     Patient Active Problem List   Diagnosis Date Noted  . Altered mental status   . Loss of consciousness (HCC) 09/08/2018  . Encephalopathy 09/08/2018  . 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 Anxiety 04/02/2017  . Acute cystitis 04/02/2017  . Urinary incontinence 01/25/2016  . Fecal incontinence 01/25/2016  . Dysthymia 01/25/2016    Past Surgical History:  Procedure Laterality Date  . COLONOSCOPY WITH PROPOFOL N/A 11/27/2018   Procedure: COLONOSCOPY WITH PROPOFOL;  Surgeon: Toledo, Boykin Nearing, MD;  Location: [facility] ENDOSCOPY;  Service: Gastroenterology;  Laterality: N/A;  . ESOPHAGOGASTRODUODENOSCOPY (EGD) WITH PROPOFOL N/A 11/27/2018   Procedure: ESOPHAGOGASTRODUODENOSCOPY (EGD) WITH PROPOFOL;  Surgeon: Toledo, Boykin Nearing, MD;  Location:  [facility] ENDOSCOPY;  Service: Gastroenterology;  Laterality: N/A;  . NO PAST SURGERIES    . TOTAL KNEE ARTHROPLASTY Right 10/03/2017   Procedure: TOTAL KNEE ARTHROPLASTY;  Surgeon: Lyndle Herrlich, MD;  Location: [facility] ORS;  Service: Orthopedics;  Laterality: Right;     OB History   No obstetric history on file.     Family History  Problem Relation Age of Onset  . Heart disease Mother   . Heart disease Father     Social History   Tobacco Use  . Smoking status: Current Every Day Smoker    Types: Cigarettes  . Smokeless tobacco: Never Used  Vaping Use  . Vaping Use: Never used  Substance Use Topics  . Alcohol use: No  . Drug use: No    Home Medications Prior to Admission medications   Medication Sig Start Date End Date Taking? Authorizing Provider  albuterol (PROVENTIL HFA;VENTOLIN HFA) 108 (90 Base) MCG/ACT inhaler Inhale 2 puffs into the lungs every 4 (four) hours as needed for wheezing or shortness of breath.    [provider]  amLODipine (NORVASC) 5 MG tablet Take 5 mg by mouth daily. (0800)    [provider]  amLODipine (NORVASC) 5 MG tablet Take 5 mg by mouth every morning.     [provider]  aspirin 81 MG chewable tablet Chew 1 tablet (81 mg total) by mouth 2 (two) times daily. 10/05/17   Lyndle Herrlich,  MD  atorvastatin (LIPITOR) 40 MG tablet Take 40 mg by mouth daily at 8 pm. (2000)    [provider]  atorvastatin (LIPITOR) 40 MG tablet Take 40 mg by mouth every evening.    [provider]  buPROPion (WELLBUTRIN XL) 150 MG 24 hr tablet Take 150 mg by mouth daily. (0800) 09/05/17   [provider]  busPIRone (BUSPAR) 10 MG tablet Take 10 mg by mouth 3 (three) times daily.    [provider]  cholecalciferol (VITAMIN D) 1000 units tablet Take 1,000 Units by mouth daily. (0800) 09/05/17   [provider]  cholecalciferol (VITAMIN D3) 25 MCG (1000 UT) tablet Take 1,000 Units by mouth every morning.     [provider]  diclofenac sodium (VOLTAREN) 1 % GEL Apply 2 g topically 4 (four) times daily as needed. 09/13/18   Briant CedarEzenduka, Nkeiruka J, MD  docusate sodium (COLACE) 100 MG capsule Take 1 capsule (100 mg total) by mouth 2 (two) times daily. Patient not taking: Reported on 11/27/2018 10/05/17   Lyndle HerrlichBowers, James R, MD  ferrous sulfate 325 (65 FE) MG tablet Take 1 tablet (325 mg total) by mouth 2 (two) times daily with a meal. 09/13/18 10/13/18  Briant CedarEzenduka, Nkeiruka J, MD  Fluticasone-Salmeterol (ADVAIR) 100-50 MCG/DOSE AEPB Inhale 1 puff into the lungs 2 (two) times daily.    [provider]  Fluticasone-Salmeterol (ADVAIR) 250-50 MCG/DOSE AEPB Inhale 1 puff into the lungs 2 (two) times daily.    [provider]  furosemide (LASIX) 20 MG tablet Take 20 mg by mouth daily. (0800) 09/05/17   [provider]  furosemide (LASIX) 20 MG tablet Take 20 mg by mouth 2 (two) times daily.    [provider]  HYDROcodone-acetaminophen (NORCO/VICODIN) 5-325 MG tablet Take 1-2 tablets by mouth every 4 (four) hours as needed for moderate pain (pain score 4-6). 10/05/17   Lyndle HerrlichBowers, James R, MD  lisinopril (PRINIVIL,ZESTRIL) 20 MG tablet Take 20 mg by mouth daily. (0800)    [provider]  lisinopril (ZESTRIL) 20 MG tablet Take 20 mg by mouth every morning.     [provider]  metFORMIN (GLUCOPHAGE-XR) 500 MG 24 hr tablet Take 1,000 mg by mouth 2 (two) times daily. (0800 & 2000) 09/05/17   [provider]  metFORMIN (GLUCOPHAGE-XR) 500 MG 24 hr tablet Take 500 mg by mouth 2 (two) times a day.     [provider]  pantoprazole (PROTONIX) 40 MG tablet Take 1 tablet (40 mg total) by mouth daily. 09/14/18 10/14/18  Briant CedarEzenduka, Nkeiruka J, MD  potassium chloride (K-DUR) 10 MEQ tablet Take 10 mEq by mouth every morning.    [provider]  traZODone (DESYREL) 100 MG tablet Take 100 mg by mouth at bedtime.    [provider]    Allergies      Latex  Review of Systems   Review of Systems  All other systems reviewed and are negative.   Physical Exam Updated Vital Signs BP 124/80   Pulse 92   Temp 98.6 F (37 C) (Oral)   Resp 17   Ht 5\' 8"  (1.727 m)   SpO2 100%   BMI 38.01 kg/m   Physical Exam Vitals and nursing note reviewed.  Constitutional:      Appearance: She is well-developed.  HENT:     Head: Normocephalic.     Nose: Nose normal.     Mouth/Throat:     Mouth: Mucous membranes are moist.  Eyes:  Extraocular Movements: Extraocular movements intact.     Pupils: Pupils are equal, round, and reactive to light.  Cardiovascular:     Rate and Rhythm: Normal rate.  Pulmonary:     Effort: Pulmonary effort is normal.  Abdominal:     General: There is no distension.  Musculoskeletal:        General: Normal range of motion.     Cervical back: Normal range of motion.  Skin:    General: Skin is warm.  Neurological:     General: No focal deficit present.     Mental Status: She is alert.     Cranial Nerves: No cranial nerve deficit.     Coordination: Coordination normal.  Psychiatric:        Mood and Affect: Mood normal.     ED Results / Procedures / Treatments   Labs (all labs ordered are listed, but only abnormal results are displayed) Labs Reviewed  APTT - Abnormal; Notable for the following components:      Result Value   aPTT 23 (*)    All other components within normal limits  CBC - Abnormal; Notable for the following components:   RBC 3.46 (*)    Hemoglobin 10.8 (*)    HCT 34.2 (*)    All other components within normal limits  COMPREHENSIVE METABOLIC PANEL - Abnormal; Notable for the following components:   CO2 21 (*)    BUN 46 (*)    Creatinine, Ser 2.59 (*)    Calcium 8.5 (*)    GFR calc non Af Amer 20 (*)    GFR calc Af Amer 24 (*)    All other components within normal limits  SARS CORONAVIRUS 2 BY RT PCR (HOSPITAL ORDER, PERFORMED IN [facility])  PROTIME-INR   DIFFERENTIAL  CBG MONITORING, ED    EKG None  Radiology CT HEAD WO CONTRAST  Result Date: 10/20/2019 CLINICAL DATA:  New onset weakness. EXAM: CT HEAD WITHOUT CONTRAST TECHNIQUE: Contiguous axial images were obtained from the base of the skull through the vertex without intravenous contrast. COMPARISON:  September 07, 2018 FINDINGS: Brain: There is mild cerebral atrophy with widening of the extra-axial spaces and ventricular dilatation. There are areas of decreased attenuation within the white matter tracts of the supratentorial brain, consistent with microvascular disease changes. Small chronic bilateral basal ganglia lacunar infarcts are seen. Vascular: No hyperdense vessel or unexpected calcification. Skull: Normal. Negative for fracture or focal lesion. Sinuses/Orbits: There is a small right maxillary sinus air-fluid level. No acute osseous abnormalities are seen. Other: None. IMPRESSION: 1. Generalized cerebral atrophy. 2. No acute intracranial abnormality. Electronically Signed   By: Aram Candela M.D.   On: 10/20/2019 18:47    Procedures Procedures (including critical care time)  Medications Ordered in ED Medications  sodium chloride flush (NS) 0.9 % injection 3 mL (3 mLs Intravenous Not Given 10/20/19 1802)    ED Course  I have reviewed the triage vital signs and the nursing notes.  Pertinent labs & imaging results that were available during my care of the patient were reviewed by me and considered in my medical decision making (see chart for details).    MDM Rules/Calculators/A&P                          MDM:  Ct no acute BUN and creat elevated from results in 2020 , teleneurology consulted and advised admission for TI workup including MRI. covid pending Final  Clinical Impression(s) / ED Diagnoses Final diagnoses:  TIA (transient ischemic attack)    Rx / DC Orders ED Discharge Orders    None       Osie Cheeks 10/20/19 2007    Zammit, Joseph,  MD 10/20/19 2039

## 2019-10-21 ENCOUNTER — Inpatient Hospital Stay (HOSPITAL_COMMUNITY): Payer: Medicaid Other

## 2019-10-21 DIAGNOSIS — G459 Transient cerebral ischemic attack, unspecified: Principal | ICD-10-CM

## 2019-10-21 DIAGNOSIS — N179 Acute kidney failure, unspecified: Secondary | ICD-10-CM

## 2019-10-21 DIAGNOSIS — R209 Unspecified disturbances of skin sensation: Secondary | ICD-10-CM

## 2019-10-21 DIAGNOSIS — Z720 Tobacco use: Secondary | ICD-10-CM

## 2019-10-21 DIAGNOSIS — N1832 Chronic kidney disease, stage 3b: Secondary | ICD-10-CM

## 2019-10-21 LAB — HEMOGLOBIN A1C
Hgb A1c MFr Bld: 5.1 % (ref 4.8–5.6)
Mean Plasma Glucose: 99.67 mg/dL

## 2019-10-21 LAB — VITAMIN B12: Vitamin B-12: 225 pg/mL (ref 180–914)

## 2019-10-21 LAB — LIPID PANEL
Cholesterol: 87 mg/dL (ref 0–200)
HDL: 52 mg/dL (ref >40)
LDL Cholesterol: 25 mg/dL (ref 0–99)
Total CHOL/HDL Ratio: 1.7 RATIO
Triglycerides: 48 mg/dL (ref <150)
VLDL: 10 mg/dL (ref 0–40)

## 2019-10-21 LAB — ECHOCARDIOGRAM COMPLETE
AR max vel: 3.73 cm2
AV Area VTI: 4.13 cm2
AV Area mean vel: 3.55 cm2
AV Mean grad: 2.3 mmHg
AV Peak grad: 4.4 mmHg
Ao pk vel: 1.05 m/s
Area-P 1/2: 2.48 cm2
Height: 68 in
S' Lateral: 2.44 cm
Weight: 2268.09 oz

## 2019-10-21 LAB — URINALYSIS, ROUTINE W REFLEX MICROSCOPIC
Bilirubin Urine: NEGATIVE
Glucose, UA: NEGATIVE mg/dL
Hgb urine dipstick: NEGATIVE
Ketones, ur: NEGATIVE mg/dL
Leukocytes,Ua: NEGATIVE
Nitrite: POSITIVE — AB
Protein, ur: NEGATIVE mg/dL
Specific Gravity, Urine: 1.006 (ref 1.005–1.030)
pH: 5 (ref 5.0–8.0)

## 2019-10-21 LAB — HIV ANTIBODY (ROUTINE TESTING W REFLEX): HIV Screen 4th Generation wRfx: NONREACTIVE

## 2019-10-21 LAB — BASIC METABOLIC PANEL
Anion gap: 10 (ref 5–15)
BUN: 39 mg/dL — ABNORMAL HIGH (ref 6–20)
CO2: 23 mmol/L (ref 22–32)
Calcium: 8 mg/dL — ABNORMAL LOW (ref 8.9–10.3)
Chloride: 102 mmol/L (ref 98–111)
Creatinine, Ser: 1.93 mg/dL — ABNORMAL HIGH (ref 0.44–1.00)
GFR calc Af Amer: 34 mL/min — ABNORMAL LOW (ref >60)
GFR calc non Af Amer: 29 mL/min — ABNORMAL LOW (ref >60)
Glucose, Bld: 79 mg/dL (ref 70–99)
Potassium: 4.2 mmol/L (ref 3.5–5.1)
Sodium: 135 mmol/L (ref 135–145)

## 2019-10-21 LAB — CBC
HCT: 30.8 % — ABNORMAL LOW (ref 36.0–46.0)
Hemoglobin: 9.8 g/dL — ABNORMAL LOW (ref 12.0–15.0)
MCH: 31.3 pg (ref 26.0–34.0)
MCHC: 31.8 g/dL (ref 30.0–36.0)
MCV: 98.4 fL (ref 80.0–100.0)
Platelets: 287 10*3/uL (ref 150–400)
RBC: 3.13 MIL/uL — ABNORMAL LOW (ref 3.87–5.11)
RDW: 12.4 % (ref 11.5–15.5)
WBC: 6.7 10*3/uL (ref 4.0–10.5)
nRBC: 0 % (ref 0.0–0.2)

## 2019-10-21 LAB — GLUCOSE, CAPILLARY
Glucose-Capillary: 69 mg/dL — ABNORMAL LOW (ref 70–99)
Glucose-Capillary: 149 mg/dL — ABNORMAL HIGH (ref 70–99)
Glucose-Capillary: 110 mg/dL — ABNORMAL HIGH (ref 70–99)
Glucose-Capillary: 146 mg/dL — ABNORMAL HIGH (ref 70–99)

## 2019-10-21 LAB — TSH: TSH: 1.638 u[IU]/mL (ref 0.350–4.500)

## 2019-10-21 MED ORDER — SODIUM CHLORIDE 0.9 % IV SOLN: INTRAVENOUS | Status: AC

## 2019-10-21 MED ORDER — ASPIRIN EC 325 MG PO TBEC
325.0000 mg | DELAYED_RELEASE_TABLET | Freq: Every day | ORAL | Status: DC
  Administered 2019-10-22: 325 mg via ORAL
  Filled 2019-10-21: qty 1

## 2019-10-21 MED ORDER — CLOPIDOGREL BISULFATE 75 MG PO TABS
75.0000 mg | ORAL_TABLET | Freq: Every day | ORAL | Status: DC
  Administered 2019-10-22: 75 mg via ORAL
  Filled 2019-10-21: qty 1

## 2019-10-21 NOTE — Evaluation (Signed)
Occupational Therapy Evaluation Patient Details Name: [name] [name] MRN: [id number] DOB: Feb 15, 1966 Today's Date: 10/21/2019    History of Present Illness [name] [name] is an 54 y.o. female with medical history significant for CVA with no residual weakness.  She is a poor historian and lives in a group home.  She was referred to the hospital today on account of new onset right-handed weakness.  Patient apparently had tried using her right arm and was noted to have weakness.  The symptoms are recurrent and have occurred in the past.  By the time patient arrived in the ED, symptoms had resolved and patient declined  TPA.    Clinical Impression   Pt agreeable to OT evaluation this am. Pt reports symptoms have resolved. Pt is performing ADLs at her baseline using RUE as dominant. BUE strength is WFL, coordination and sensation are intact. No further OT services required at this time.      Follow Up Recommendations  No OT follow up;Supervision/Assistance - 24 hour    Equipment Recommendations  None recommended by OT       Precautions / Restrictions Precautions Precautions: Fall Restrictions Weight Bearing Restrictions: No      Mobility Bed Mobility Overal bed mobility: Modified Independent                       ADL either performed or assessed with clinical judgement   ADL Overall ADL's : Modified independent;At baseline                                             Vision Baseline Vision/History: No visual deficits Vision Assessment?: No apparent visual deficits            Pertinent Vitals/Pain Pain Assessment: No/denies pain     Hand Dominance Right   Extremity/Trunk Assessment Upper Extremity Assessment Upper Extremity Assessment: Overall WFL for tasks assessed (BUE 4/5)   Lower Extremity Assessment Lower Extremity Assessment: Defer to PT evaluation   Cervical / Trunk Assessment Cervical / Trunk Assessment: Normal    Communication Communication Communication: No difficulties   Cognition Arousal/Alertness: Awake/alert Behavior During Therapy: WFL for tasks assessed/performed Overall Cognitive Status: Within Functional Limits for tasks assessed                                                Home Living Family/patient expects to be discharged to:: Group home                                        Prior Functioning/Environment Level of Independence: Needs assistance  Gait / Transfers Assistance Needed: Uses rollator for mobility ADL's / Homemaking Assistance Needed: Pt reports independence in B/ADLs            OT Problem List: Decreased activity tolerance          End of Session    Activity Tolerance: Patient tolerated treatment well Patient left: in bed;with call bell/phone within reach;with bed alarm set  OT Visit Diagnosis: Muscle weakness (generalized) (M62.81)                Time: 7628-3151 OT  Time Calculation (min): 10 min Charges:  OT General Charges $OT Visit: 1 Visit OT Evaluation $OT Eval Low Complexity: 1 Low   Ezra Sites, OTR/L  [phone] 10/21/2019, 8:25 AM

## 2019-10-21 NOTE — Progress Notes (Signed)
*  PRELIMINARY RESULTS* Echocardiogram 2D Echocardiogram has been performed.  Jeryl Columbia 10/21/2019, 1:33 PM

## 2019-10-21 NOTE — Plan of Care (Signed)
  Problem: Acute Rehab PT Goals(only PT should resolve) Goal: Patient Will Transfer Sit To/From Stand Outcome: Progressing Flowsheets (Taken 10/21/2019 0913) Patient will transfer sit to/from stand: with modified independence Goal: Pt Will Transfer Bed To Chair/Chair To Bed Outcome: Progressing Flowsheets (Taken 10/21/2019 0913) Pt will Transfer Bed to Chair/Chair to Bed: with modified independence Goal: Pt Will Ambulate Outcome: Progressing Flowsheets (Taken 10/21/2019 0913) Pt will Ambulate:  > 125 feet  with modified independence  with rolling walker   Tori Londyn Hotard PT, DPT 10/21/19, 9:14 AM [phone]

## 2019-10-21 NOTE — Progress Notes (Signed)
PROGRESS NOTE  [name] [name] OHF:[id number] DOB: October 11, 1965 DOA: 10/20/2019 PCP: Patient, No Pcp Per  Brief History:  54 year old female with a history of depression, diabetes mellitus type 2, hypertension, and tobacco abuse presenting with right hand numbness and weakness that began on 10/20/2019 around breakfast time.  The patient stated that she had some weakness causing her to drop her cough at the time.  The episode lasted 30 minutes.  She had a similar episode around 4:30 PM on 10/20/2019.  Once again, the episode lasted about 30 minutes.  She denied any headache, dysarthria, other focal extremity weakness, visual disturbance.  She denies any fevers, chills, chest pain, coughing, hemoptysis, shortness of breath, nausea, vomiting, diarrhea, abdominal pain. In the emergency department, the patient was afebrile hemodynamically stable.  Blood pressures were somewhat soft with systolic blood pressure in the upper 90s and low 100s.  Oxygen saturation was 100% room air.  BMP showed a serum creatinine of 2.59 which is above her usual baseline.  CBC was essentially unremarkable with hemoglobin 10.8.  CT of the brain was negative.  Neurology was consulted to assist with management.  Assessment/Plan: Sensory disturbance/right hand weakness -Concerned about stroke -Appreciate Neurology Consult -PT/OT evaluation -Speech therapy eval -CT brain--neg -MRI brain-- -MRA brain-- -Carotid Duplex-- -Echo-- -LDL--25 -HbA1C-- -Antiplatelet--ASA 81 mg  Acute on chronic renal failure--CKD stage IIIb -Baseline creatinine ~1.6 -presented with serum creatinine 2.59 -Secondary to volume depletion and hemodynamic changes -Holding lisinopril and Lasix -Renal ultrasound -UA--bland -continue IVF another 24 hours  Essential hypertension -Allow for permissive hypertension -Holding lisinopril and Lasix -Holding amlodipine  Diabetes mellitus type 2 -Holding Metformin -Hemoglobin A1c -NovoLog  sliding scale  Tobacco abuse -Patient has nearly 30-pack-year history -Continue Dulera -Tobacco cessation discussed  Depression/anxiety -Continue BuSpar and trazodone  Hyperlipidemia -Continue statin -LDL 25       Status is: Inpatient  Remains inpatient appropriate because:IV treatments appropriate due to intensity of illness or inability to take PO   Dispo: The patient is from: ALF              Anticipated d/c is to: ALF              Anticipated d/c date is: 1 day              Patient currently is not medically stable to d/c.        Family Communication: no  Family at bedside  Consultants:  neurology  Code Status:  FULL   DVT Prophylaxis:  [place] Heparin    Procedures: As Listed in Progress Note Above  Antibiotics: None       Subjective: The patient states that she is feeling back to "normal". Patient denies fevers, chills, headache, chest pain, dyspnea, nausea, vomiting, diarrhea, abdominal pain, dysuria, hematuria, hematochezia, and melena.   Objective: Vitals:   10/21/19 0404 10/21/19 0553 10/21/19 0749 10/21/19 0755  BP: 98/67 132/84 (!) 127/91   Pulse: 74 81 75   Resp: 16 18 18    Temp: 97.6 F (36.4 C) 97.8 F (36.6 C) 97.9 F (36.6 C)   TempSrc:  Oral Oral   SpO2: 100% 100% 100% 98%  Weight:      Height:        Intake/Output Summary (Last 24 hours) at 10/21/2019 0826 Last data filed at 10/21/2019 0553 Gross per 24 hour  Intake 171.8 ml  Output 500 ml  Net -328.2 ml   Weight change:  Exam:   General:  Pt is alert, follows commands appropriately, not in acute distress  HEENT: No icterus, No thrush, No neck mass, [place]/AT  Cardiovascular: RRR, S1/S2, no rubs, no gallops  Respiratory: CTA bilaterally, no wheezing, no crackles, no rhonchi  Abdomen: Soft/+BS, non tender, non distended, no guarding  Extremities: No edema, No lymphangitis, No petechiae, No rashes, no synovitis  Neuro:  CN II-XII intact, strength 4/5 in RUE, RLE,  strength 4/5 LUE, LLE; sensation intact bilateral; no dysmetria; babinski equivocal     Data Reviewed: I have personally reviewed following labs and imaging studies Basic Metabolic Panel: Recent Labs  Lab 10/20/19 1800 10/21/19 0620  NA 136 135  K 4.5 4.2  CL 100 102  CO2 21* 23  GLUCOSE 82 79  BUN 46* 39*  CREATININE 2.59* 1.93*  CALCIUM 8.5* 8.0*   Liver Function Tests: Recent Labs  Lab 10/20/19 1800  AST 18  ALT 9  ALKPHOS 56  BILITOT 0.5  PROT 7.9  ALBUMIN 3.9   No results for input(s): LIPASE, AMYLASE in the last 168 hours. No results for input(s): AMMONIA in the last 168 hours. Coagulation Profile: Recent Labs  Lab 10/20/19 1800  INR 1.0   CBC: Recent Labs  Lab 10/20/19 1800 10/21/19 0620  WBC 7.8 6.7  NEUTROABS 4.7  --   HGB 10.8* 9.8*  HCT 34.2* 30.8*  MCV 98.8 98.4  PLT 307 287   Cardiac Enzymes: No results for input(s): CKTOTAL, CKMB, CKMBINDEX, TROPONINI in the last 168 hours. BNP: Invalid input(s): POCBNP CBG: Recent Labs  Lab 10/20/19 1827 10/20/19 2157 10/20/19 2353 10/21/19 0742  GLUCAP 72 77 124* 69*   HbA1C: No results for input(s): HGBA1C in the last 72 hours. Urine analysis:    Component Value Date/Time   COLORURINE YELLOW 10/21/2019 0419   APPEARANCEUR CLEAR 10/21/2019 0419   LABSPEC 1.006 10/21/2019 0419   PHURINE 5.0 10/21/2019 0419   GLUCOSEU NEGATIVE 10/21/2019 0419   HGBUR NEGATIVE 10/21/2019 0419   BILIRUBINUR NEGATIVE 10/21/2019 0419   KETONESUR NEGATIVE 10/21/2019 0419   PROTEINUR NEGATIVE 10/21/2019 0419   NITRITE POSITIVE (A) 10/21/2019 0419   LEUKOCYTESUR NEGATIVE 10/21/2019 0419   Sepsis Labs: @LABRCNTIP (procalcitonin:4,lacticidven:4) ) Recent Results (from the past 240 hour(s))  SARS Coronavirus 2 by RT PCR (hospital order, performed in [facility] hospital lab) Nasopharyngeal Nasopharyngeal Swab     Status: None   Collection Time: 10/20/19  7:18 PM   Specimen: Nasopharyngeal Swab  Result Value  Ref Range Status   SARS Coronavirus 2 NEGATIVE NEGATIVE Final    Comment: (NOTE) SARS-CoV-2 target nucleic acids are NOT DETECTED.  The SARS-CoV-2 RNA is generally detectable in upper and lower respiratory specimens during the acute phase of infection. The lowest concentration of SARS-CoV-2 viral copies this assay can detect is 250 copies / mL. A negative result does not preclude SARS-CoV-2 infection and should not be used as the sole basis for treatment or other patient management decisions.  A negative result may occur with improper specimen collection / handling, submission of specimen other than nasopharyngeal swab, presence of viral mutation(s) within the areas targeted by this assay, and inadequate number of viral copies (<250 copies / mL). A negative result must be combined with clinical observations, patient history, and epidemiological information.  Fact Sheet for Patients:   10/22/19  Fact Sheet for Healthcare Providers: BoilerBrush.com.cy  This test is not yet approved or  cleared by the https://pope.com/ FDA and has been authorized for detection and/or  diagnosis of SARS-CoV-2 by FDA under an Emergency Use Authorization (EUA).  This EUA will remain in effect (meaning this test can be used) for the duration of the COVID-19 declaration under Section 564(b)(1) of the Act, 21 U.S.C. section 360bbb-3(b)(1), unless the authorization is terminated or revoked sooner.  Performed at [facility], [address] [place], [place] [address]      Scheduled Meds: .  stroke: mapping our early stages of recovery book   Does not apply Once  . aspirin EC  81 mg Oral Daily  . atorvastatin  40 mg Oral Q2000  . busPIRone  10 mg Oral TID  . cholecalciferol  1,000 Units Oral Daily  . docusate sodium  100 mg Oral BID  . heparin  5,000 Units Subcutaneous Q8H  . insulin aspart  0-9 Units Subcutaneous TID WC  . mometasone-formoterol  2  puff Inhalation BID  . nicotine  21 mg Transdermal Daily  . pantoprazole  40 mg Oral Daily  . sodium chloride flush  3 mL Intravenous Once  . traZODone  100 mg Oral QHS   Continuous Infusions: . sodium chloride 125 mL/hr at 10/20/19 2204    Procedures/Studies: CT HEAD WO CONTRAST  Result Date: 10/20/2019 CLINICAL DATA:  New onset weakness. EXAM: CT HEAD WITHOUT CONTRAST TECHNIQUE: Contiguous axial images were obtained from the base of the skull through the vertex without intravenous contrast. COMPARISON:  September 07, 2018 FINDINGS: Brain: There is mild cerebral atrophy with widening of the extra-axial spaces and ventricular dilatation. There are areas of decreased attenuation within the white matter tracts of the supratentorial brain, consistent with microvascular disease changes. Small chronic bilateral basal ganglia lacunar infarcts are seen. Vascular: No hyperdense vessel or unexpected calcification. Skull: Normal. Negative for fracture or focal lesion. Sinuses/Orbits: There is a small right maxillary sinus air-fluid level. No acute osseous abnormalities are seen. Other: None. IMPRESSION: 1. Generalized cerebral atrophy. 2. No acute intracranial abnormality. Electronically Signed   By: Aram Candela M.D.   On: 10/20/2019 18:47    Catarina Hartshorn, DO  [facility]  If 7PM-7AM, please contact night-coverage [email or web address] Password [email or web address] 10/21/2019, 8:26 AM   LOS: 1 day

## 2019-10-21 NOTE — TOC Initial Note (Signed)
Transition of Care [facility]) - Initial/Assessment Note    Patient Details  Name: [name] [name] MRN: [id number] Date of Birth: 07-19-1965  Transition of [facility]) CM/SW Contact:    Karn Cassis, LCSW Phone Number: 10/21/2019, 8:56 AM  Clinical Narrative:  Pt admitted due to TIA/cardioembolic stroke. Pt is a resident at [facility]. She has been a resident there for several years. Pt's legal guardian is Melina Copa at [facility]. LCSW spoke with Karel Jarvis who confirms plan to return to group home when medically stable. Per [name] at facility, pt is limited assist with ADLs at baseline and ambulates with a walker. No current home health services. Pt's son keeps in contact with pt, but Stanton Kidney was unable to reach him yesterday. Okay to return.                    Expected Discharge Plan: Group Home Barriers to Discharge: Continued Medical Work up   Patient Goals and CMS Choice Patient states their goals for this hospitalization and ongoing recovery are:: Return to group home      Expected Discharge Plan and Services Expected Discharge Plan: Group Home In-house Referral: Clinical Social Work     Living arrangements for the past 2 months: Group Home                                      Prior Living Arrangements/Services Living arrangements for the past 2 months: Group Home Lives with:: Facility Resident Patient language and need for interpreter reviewed:: Yes Do you feel safe going back to the place where you live?: Yes      Need for Family Participation in Patient Care: No (Comment) Care giver support system in place?: Yes (comment) Current home services: DME (walker) Criminal Activity/Legal Involvement Pertinent to Current Situation/Hospitalization: No - Comment as needed  Activities of Daily Living Home Assistive Devices/Equipment: None ADL Screening (condition at time of admission) Patient's cognitive ability adequate to safely  complete daily activities?: Yes Is the patient deaf or have difficulty hearing?: No Does the patient have difficulty seeing, even when wearing glasses/contacts?: No Does the patient have difficulty concentrating, remembering, or making decisions?: No Patient able to express need for assistance with ADLs?: Yes Does the patient have difficulty dressing or bathing?: No Independently performs ADLs?: No Communication: Independent Dressing (OT): Independent Grooming: Independent Feeding: Independent Bathing: Independent Toileting: Independent In/Out Bed: Independent Walks in Home: Independent Does the patient have difficulty walking or climbing stairs?: No Weakness of Legs: None Weakness of Arms/Hands: None  Permission Sought/Granted                  Emotional Assessment   Attitude/Demeanor/Rapport: Unable to Assess Affect (typically observed): Unable to Assess   Alcohol / Substance Use: Not Applicable    Admission diagnosis:  TIA (transient ischemic attack) [G45.9] AKI (acute kidney injury) (HCC) [N17.9] Acute kidney injury (HCC) [N17.9] Patient Active Problem List   Diagnosis Date Noted  . Acute renal failure superimposed on stage 3b chronic kidney disease (HCC) 10/21/2019  . Tobacco abuse 10/21/2019  . Sensory disturbance 10/21/2019  . TIA (transient ischemic attack) 10/20/2019  . Altered mental status   . Loss of consciousness (HCC) 09/08/2018  . Encephalopathy 09/08/2018  . 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 Anxiety 04/02/2017  . Acute cystitis 04/02/2017  . Urinary incontinence 01/25/2016  .  Fecal incontinence 01/25/2016  . Dysthymia 01/25/2016   PCP:  Patient, No Pcp Per Pharmacy:   [facility] - [place], [place] - [address] [place] [address] Phone: [phone] Fax: [phone]     Social Determinants of Health (SDOH) Interventions    Readmission Risk  Interventions Readmission Risk Prevention Plan 10/04/2017  Post Dischage Appt Complete  Medication Screening Complete  Transportation Screening Complete  PCP follow-up Complete  Some recent data might be hidden

## 2019-10-21 NOTE — Progress Notes (Signed)
SLP Cancellation Note  Patient Details Name: [name] [name] MRN: [id number] DOB: 1966-01-04   Cancelled treatment:       Reason Eval/Treat Not Completed: SLP screened, no needs identified, will sign off. Pt reports all symptoms have resolved. Speech and language are at baseline. No further ST services are required at this time. Thank you,  [name], CCC-SLP Speech Language Pathologist    Georgetta Haber 10/21/2019, 1:39 PM

## 2019-10-21 NOTE — Progress Notes (Signed)
CBG = 69. Two cups of orange juice given.

## 2019-10-21 NOTE — Consult Note (Signed)
[facility] [name] [name] A. [name], MD     [email or web address]          [name] [name] is an 54 y.o. female.   ASSESSMENT/PLAN: THE EVENTS ARE CONSISTENT WITH TIA: Dual antiplatelet agents are recommended for 1 month and subsequently single agent afterwards. Typical stroke workup is recommended. Also consider adding a statin.   The patient presents with acute weakness of the right upper extremity. She had 2 episodes. The initial event lasted for 30 minutes. The 2nd event lasted for about an hour. She denies any dysarthria or dysphagia. She denies any symptoms involving the right lower extremity. She mostly reports having heaviness and weakness but no sensory symptoms. She denies any headaches, dysarthria, diplopia chest pain or shortness of breath. The review systems otherwise negative. She was not on antiplatelet agents previously.  GENERAL: This is a very pleasant thin female who is doing well at this time.  HEENT: Neck is supple no trauma noted.  ABDOMEN: Soft  EXTREMITIES: No edema   BACK: Normal alignment.  SKIN: Normal by inspection.    MENTAL STATUS: Alert and oriented. Speech, language and cognition are generally intact. Judgment and insight normal.   CRANIAL NERVES: Pupils are equal, round and reactive to light and accommodation; extraocular movements are full, there is no significant nystagmus; upper and lower facial muscles are normal in strength and symmetric, there is no flattening of the nasolabial folds; tongue is midline; uvula is midline; shoulder elevation is normal.  MOTOR: Normal tone, bulk and strength; no pronator drift. There is mild drift of the left lower extremity. There is no drift of the other extremities.  COORDINATION: Appears to be subtle dysmetria on finger-to-nose bilaterally. No tremors are noted. No parkinsonian features no myoclonus. Heel to shin is normal bilaterally.  REFLEXES: Deep tendon reflexes are symmetrical and normal. Plantar  responses are flexor bilaterally.   SENSATION: Normal to light touch and temperature. NIH stroke scale 1.    Blood pressure (!) 127/91, pulse 75, temperature 97.9 F (36.6 C), temperature source Oral, resp. rate 18, height 5\' 8"  (1.727 m), weight 64.3 kg, SpO2 100 %.  Past Medical History:  Diagnosis Date  . Acute cystitis   . Anxiety   . Arthritis   . CVA (cerebral vascular accident) (HCC)   . Depression   . Diabetes mellitus without complication (HCC)   . Dyspnea   . Fecal incontinence   . Hypertension   . Incontinence   . Incontinence of urine   . Stroke (HCC) 2004   x 2  . Urinary incontinence     Past Surgical History:  Procedure Laterality Date  . COLONOSCOPY WITH PROPOFOL N/A 11/27/2018   Procedure: COLONOSCOPY WITH PROPOFOL;  Surgeon: Toledo, 01/27/2019, MD;  Location: [facility] ENDOSCOPY;  Service: Gastroenterology;  Laterality: N/A;  . ESOPHAGOGASTRODUODENOSCOPY (EGD) WITH PROPOFOL N/A 11/27/2018   Procedure: ESOPHAGOGASTRODUODENOSCOPY (EGD) WITH PROPOFOL;  Surgeon: Toledo, 01/27/2019, MD;  Location: [facility] ENDOSCOPY;  Service: Gastroenterology;  Laterality: N/A;  . NO PAST SURGERIES    . TOTAL KNEE ARTHROPLASTY Right 10/03/2017   Procedure: TOTAL KNEE ARTHROPLASTY;  Surgeon: 10/05/2017, MD;  Location: [facility] ORS;  Service: Orthopedics;  Laterality: Right;    Family History  Problem Relation Age of Onset  . Heart disease Mother   . Heart disease Father     Social History:  reports that she has been smoking cigarettes. She has never used smokeless tobacco. She reports that she does not drink alcohol and does  not use drugs.  Allergies:  Allergies  Allergen Reactions  . Latex Rash    Severe itching.    Medications: Prior to Admission medications   Medication Sig Start Date End Date Taking? Authorizing Provider  albuterol (PROVENTIL HFA;VENTOLIN HFA) 108 (90 Base) MCG/ACT inhaler Inhale 2 puffs into the lungs every 4 (four) hours as needed for wheezing or  shortness of breath.   Yes [provider]  amLODipine (NORVASC) 10 MG tablet Take 10 mg by mouth every morning.    Yes [provider]  atorvastatin (LIPITOR) 40 MG tablet Take 40 mg by mouth daily at 8 pm. (2000)   Yes [provider]  busPIRone (BUSPAR) 10 MG tablet Take 10 mg by mouth 3 (three) times daily.   Yes [provider]  cholecalciferol (VITAMIN D3) 25 MCG (1000 UT) tablet Take 1,000 Units by mouth every morning.   Yes [provider]  diclofenac sodium (VOLTAREN) 1 % GEL Apply 2 g topically 4 (four) times daily as needed. Patient taking differently: Apply 2 g topically in the morning and at bedtime.  09/13/18  Yes Briant Cedar, MD  ferrous sulfate 325 (65 FE) MG tablet Take 1 tablet (325 mg total) by mouth 2 (two) times daily with a meal. Patient taking differently: Take 325 mg by mouth daily with breakfast.  09/13/18 10/20/19 Yes Briant Cedar, MD  Fluticasone-Salmeterol (ADVAIR) 100-50 MCG/DOSE AEPB Inhale 1 puff into the lungs 2 (two) times daily.   Yes [provider]  furosemide (LASIX) 20 MG tablet Take 30 mg by mouth 2 (two) times daily. (0800) 09/05/17  Yes [provider]  lisinopril (PRINIVIL,ZESTRIL) 20 MG tablet Take 20 mg by mouth daily. (0800)   Yes [provider]  metFORMIN (GLUCOPHAGE-XR) 500 MG 24 hr tablet Take 1,000 mg by mouth 2 (two) times daily. (0800 & 2000) 09/05/17  Yes [provider]  pantoprazole (PROTONIX) 40 MG tablet Take 1 tablet (40 mg total) by mouth daily. 09/14/18 10/20/19 Yes Briant Cedar, MD  traMADol (ULTRAM) 50 MG tablet Take 50 mg by mouth 4 (four) times daily.  09/30/19  Yes [provider]  traZODone (DESYREL) 100 MG tablet Take 100 mg by mouth at bedtime.   Yes [provider]    Scheduled Meds: .  stroke: mapping our early stages of recovery book   Does not apply Once  . aspirin EC  81 mg Oral Daily  . atorvastatin  40 mg  Oral Q2000  . busPIRone  10 mg Oral TID  . cholecalciferol  1,000 Units Oral Daily  . docusate sodium  100 mg Oral BID  . heparin  5,000 Units Subcutaneous Q8H  . insulin aspart  0-9 Units Subcutaneous TID WC  . mometasone-formoterol  2 puff Inhalation BID  . nicotine  21 mg Transdermal Daily  . pantoprazole  40 mg Oral Daily  . sodium chloride flush  3 mL Intravenous Once  . traZODone  100 mg Oral QHS   Continuous Infusions: . sodium chloride 125 mL/hr at 10/20/19 2204   PRN Meds:.albuterol, HYDROcodone-acetaminophen, ondansetron **OR** ondansetron (ZOFRAN) IV     Results for orders placed or performed during the hospital encounter of 10/20/19 (from the past 48 hour(s))  Protime-INR     Status: None   Collection Time: 10/20/19  6:00 PM  Result Value Ref Range   Prothrombin Time 12.3 11.4 - 15.2 seconds   INR 1.0 0.8 - 1.2    Comment: (NOTE) INR  goal varies based on device and disease states. Performed at [facility], [address] [place], [place] [address]   APTT     Status: Abnormal   Collection Time: 10/20/19  6:00 PM  Result Value Ref Range   aPTT 23 (L) 24 - 36 seconds    Comment: Performed at [facility], [address] [place], [place] [address]  CBC     Status: Abnormal   Collection Time: 10/20/19  6:00 PM  Result Value Ref Range   WBC 7.8 4.0 - 10.5 K/uL   RBC 3.46 (L) 3.87 - 5.11 MIL/uL   Hemoglobin 10.8 (L) 12.0 - 15.0 g/dL   HCT 82.934.2 (L) 36 - 46 %   MCV 98.8 80.0 - 100.0 fL   MCH 31.2 26.0 - 34.0 pg   MCHC 31.6 30.0 - 36.0 g/dL   RDW 56.212.4 13.011.5 - 86.515.5 %   Platelets 307 150 - 400 K/uL   nRBC 0.0 0.0 - 0.2 %    Comment: Performed at [facility], [address] [place], [place] [address]  Differential     Status: None   Collection Time: 10/20/19  6:00 PM  Result Value Ref Range   Neutrophils Relative % 61 %   Neutro Abs 4.7 1.7 - 7.7 K/uL   Lymphocytes Relative 29 %   Lymphs Abs 2.3 0.7 - 4.0 K/uL   Monocytes Relative 8 %   Monocytes Absolute 0.6  0 - 1 K/uL   Eosinophils Relative 2 %   Eosinophils Absolute 0.2 0 - 0 K/uL   Basophils Relative 0 %   Basophils Absolute 0.0 0 - 0 K/uL   Immature Granulocytes 0 %   Abs Immature Granulocytes 0.02 0.00 - 0.07 K/uL    Comment: Performed at [facility], [address] [place], [place] [address]  Comprehensive metabolic panel     Status: Abnormal   Collection Time: 10/20/19  6:00 PM  Result Value Ref Range   Sodium 136 135 - 145 mmol/L   Potassium 4.5 3.5 - 5.1 mmol/L   Chloride 100 98 - 111 mmol/L   CO2 21 (L) 22 - 32 mmol/L   Glucose, Bld 82 70 - 99 mg/dL    Comment: Glucose reference range applies only to samples taken after fasting for at least 8 hours.   BUN 46 (H) 6 - 20 mg/dL   Creatinine, Ser 8.412.59 (H) 0.44 - 1.00 mg/dL   Calcium 8.5 (L) 8.9 - 10.3 mg/dL   Total Protein 7.9 6.5 - 8.1 g/dL   Albumin 3.9 3.5 - 5.0 g/dL   AST 18 15 - 41 U/L   ALT 9 0 - 44 U/L   Alkaline Phosphatase 56 38 - 126 U/L   Total Bilirubin 0.5 0.3 - 1.2 mg/dL   GFR calc non Af Amer 20 (L) >60 mL/min   GFR calc Af Amer 24 (L) >60 mL/min   Anion gap 15 5 - 15    Comment: Performed at [facility], [address] [place], [place] [address]  CBG monitoring, ED     Status: None   Collection Time: 10/20/19  6:27 PM  Result Value Ref Range   Glucose-Capillary 72 70 - 99 mg/dL    Comment: Glucose reference range applies only to samples taken after fasting for at least 8 hours.  SARS Coronavirus 2 by RT PCR (hospital order, performed in [facility] hospital lab) Nasopharyngeal Nasopharyngeal Swab     Status: None   Collection Time: 10/20/19  7:18 PM   Specimen: Nasopharyngeal Swab  Result Value Ref Range   SARS Coronavirus 2 NEGATIVE NEGATIVE    Comment: (NOTE) SARS-CoV-2 target nucleic acids are NOT DETECTED.  The SARS-CoV-2 RNA is generally detectable in upper and lower respiratory specimens during the acute phase of infection. The lowest concentration of SARS-CoV-2 viral copies this assay can detect  is 250 copies / mL. A negative result does not preclude SARS-CoV-2 infection and should not be used as the sole basis for treatment or other patient management decisions.  A negative result may occur with improper specimen collection / handling, submission of specimen other than nasopharyngeal swab, presence of viral mutation(s) within the areas targeted by this assay, and inadequate number of viral copies (<250 copies / mL). A negative result must be combined with clinical observations, patient history, and epidemiological information.  Fact Sheet for Patients:   BoilerBrush.com.cy  Fact Sheet for Healthcare Providers: https://pope.com/  This test is not yet approved or  cleared by the [place] FDA and has been authorized for detection and/or diagnosis of SARS-CoV-2 by FDA under an Emergency Use Authorization (EUA).  This EUA will remain in effect (meaning this test can be used) for the duration of the COVID-19 declaration under Section 564(b)(1) of the Act, 21 U.S.C. section 360bbb-3(b)(1), unless the authorization is terminated or revoked sooner.  Performed at [facility], [address], [place], [place] [address]   CBG monitoring, ED     Status: None   Collection Time: 10/20/19  9:57 PM  Result Value Ref Range   Glucose-Capillary 77 70 - 99 mg/dL    Comment: Glucose reference range applies only to samples taken after fasting for at least 8 hours.  Glucose, capillary     Status: Abnormal   Collection Time: 10/20/19 11:53 PM  Result Value Ref Range   Glucose-Capillary 124 (H) 70 - 99 mg/dL    Comment: Glucose reference range applies only to samples taken after fasting for at least 8 hours.  Urinalysis, Routine w reflex microscopic Urine, Clean Catch     Status: Abnormal   Collection Time: 10/21/19  4:19 AM  Result Value Ref Range   Color, Urine YELLOW YELLOW   APPearance CLEAR CLEAR   Specific Gravity, Urine 1.006 1.005 -  1.030   pH 5.0 5.0 - 8.0   Glucose, UA NEGATIVE NEGATIVE mg/dL   Hgb urine dipstick NEGATIVE NEGATIVE   Bilirubin Urine NEGATIVE NEGATIVE   Ketones, ur NEGATIVE NEGATIVE mg/dL   Protein, ur NEGATIVE NEGATIVE mg/dL   Nitrite POSITIVE (A) NEGATIVE   Leukocytes,Ua NEGATIVE NEGATIVE   RBC / HPF 0-5 0 - 5 RBC/hpf   WBC, UA 0-5 0 - 5 WBC/hpf   Bacteria, UA RARE (A) NONE SEEN   Squamous Epithelial / LPF 0-5 0 - 5   Mucus PRESENT     Comment: Performed at [facility], [address] [place], [place] [address]  Basic metabolic panel     Status: Abnormal   Collection Time: 10/21/19  6:20 AM  Result Value Ref Range   Sodium 135 135 - 145 mmol/L   Potassium 4.2 3.5 - 5.1 mmol/L   Chloride 102 98 - 111 mmol/L   CO2 23 22 - 32 mmol/L   Glucose, Bld 79 70 - 99 mg/dL    Comment: Glucose reference range applies only to samples taken after fasting for at least 8 hours.   BUN 39 (H) 6 - 20 mg/dL   Creatinine, Ser 6.29 (H) 0.44 -  1.00 mg/dL   Calcium 8.0 (L) 8.9 - 10.3 mg/dL   GFR calc non Af Amer 29 (L) >60 mL/min   GFR calc Af Amer 34 (L) >60 mL/min   Anion gap 10 5 - 15    Comment: Performed at [facility], [address] [place], [place] [address]  CBC     Status: Abnormal   Collection Time: 10/21/19  6:20 AM  Result Value Ref Range   WBC 6.7 4.0 - 10.5 K/uL   RBC 3.13 (L) 3.87 - 5.11 MIL/uL   Hemoglobin 9.8 (L) 12.0 - 15.0 g/dL   HCT 73.4 (L) 36 - 46 %   MCV 98.4 80.0 - 100.0 fL   MCH 31.3 26.0 - 34.0 pg   MCHC 31.8 30.0 - 36.0 g/dL   RDW 19.3 79.0 - 24.0 %   Platelets 287 150 - 400 K/uL   nRBC 0.0 0.0 - 0.2 %    Comment: Performed at [facility], [address] [place], [place] [address]  Glucose, capillary     Status: Abnormal   Collection Time: 10/21/19  7:42 AM  Result Value Ref Range   Glucose-Capillary 69 (L) 70 - 99 mg/dL    Comment: Glucose reference range applies only to samples taken after fasting for at least 8 hours.   Comment 1 Notify RN    Comment 2 Document in  Chart     Studies/Results: CAROTID DOPPLERS  IMPRESSION: Minor carotid atherosclerosis. No hemodynamically significant ICA stenosis. Degree of narrowing less than 50% bilaterally by ultrasound criteria.  Patent normal antegrade vertebral flow bilaterally    BRAIN MRI FINDINGS: The patient was unable to tolerate the full examination. As a result, only axial and coronal diffusion-weighted sequences could be obtained. There is moderate/severe motion degradation of the axial diffusion-weighted sequence and mild motion degradation of the coronal diffusion-weighted sequence. There is no evidence of acute infarct.  IMPRESSION: Prematurely terminated examination consisting of only axial and coronal diffusion-weighted imaging. The axial DWI sequence is moderate to severely motion degraded. The coronal DWI sequence is mildly motion degraded.  No evidence of acute infarct.     The MRI is reviewed in person. The study is terminated early but no evidence of acute infarcts are noted on DWI. There is mild global atrophy.     TTE  1. Left ventricular ejection fraction, by estimation, is 60 to 65%. The  left ventricle has normal function. The left ventricle has no regional  wall motion abnormalities. There is mild left ventricular hypertrophy.  Left ventricular diastolic parameters  were normal.  2. Right ventricular systolic function is normal. The right ventricular  size is normal. There is normal pulmonary artery systolic pressure.  3. The mitral valve is normal in structure. Trivial mitral valve  regurgitation. No evidence of mitral stenosis.  4. The aortic valve is tricuspid. Aortic valve regurgitation is not  visualized. No aortic stenosis is present.  5. The inferior vena cava is normal in size with greater than 50%  respiratory variability, suggesting right atrial pressure of 3 mmHg.     Slade Pierpoint A. [name], M.D.  Diplomate, Biomedical engineer of Psychiatry and  Neurology ( Neurology). 10/21/2019, 7:53 AM

## 2019-10-21 NOTE — Evaluation (Signed)
Physical Therapy Evaluation Patient Details Name: [name] [name] MRN: [id number] DOB: [birth year] Today's Date: 10/21/2019   History of Present Illness  54 y.o. 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  Clinical Impression  Pt admitted with above diagnosis. Pt near baseline, requiring SUPV and no physical assist with OOB mobility. Pt ambulates around room with crouched posture demonstrating flexion and hips, knees and trunk and closed kinetic chain weakness, fatigues with distances requiring seated rest. Pt reports feeling back to normal and states her goal is to return home. RN notified of pt up in chair at EOS, chair alarm on, and purewick removed due to soiled and urine cannister full. Pt currently with functional limitations due to the deficits listed below (see PT Problem List). Pt will benefit from skilled PT to increase their independence and safety with mobility to allow discharge to the venue listed below.       Follow Up Recommendations No PT follow up;Supervision/Assistance - 24 hour    Equipment Recommendations  None recommended by PT    Recommendations for Other Services       Precautions / Restrictions Precautions Precautions: Fall Restrictions Weight Bearing Restrictions: No      Mobility  Bed Mobility Overal bed mobility: Modified Independent  General bed mobility comments: comes to sitting EOB without physical assistance or cues  Transfers Overall transfer level: Needs assistance Equipment used: Rolling walker (2 wheeled) Transfers: Sit to/from Stand Sit to Stand: Supervision    General transfer comment: BUE assisting to power up, bilateral closed kinetic chain weakness  Ambulation/Gait Ambulation/Gait assistance: Supervision Gait Distance (Feet): 20 Feet Assistive device: Rolling walker (2 wheeled) Gait Pattern/deviations: Step-through pattern;Decreased stride  length;Trunk flexed Gait velocity: decreased   General Gait Details: slow, flat foot steps with crouched posture noted throughout gait cycle, attempts to lift RW with turns requiring cues to maintain safety, bil closed kinetic chain weakness noted  Stairs            Wheelchair Mobility    Modified Rankin (Stroke Patients Only)       Balance Overall balance assessment: Needs assistance Sitting-balance support: Feet supported;No upper extremity supported Sitting balance-Leahy Scale: Good Sitting balance - Comments: seated EOB   Standing balance support: During functional activity;Bilateral upper extremity supported Standing balance-Leahy Scale: Poor Standing balance comment: RW for steadying            Pertinent Vitals/Pain Pain Assessment: No/denies pain    Home Living Family/patient expects to be discharged to:: Group home    Prior Function Level of Independence: Independent with assistive device(s);Needs assistance   Gait / Transfers Assistance Needed: Pt reports rollator for community distances around group home  ADL's / Homemaking Assistance Needed: Pt reports independent with bathing and dressing. Pt reports facility provides cooking and cleaning.        Hand Dominance   Dominant Hand: Right    Extremity/Trunk Assessment   Upper Extremity Assessment Upper Extremity Assessment: Defer to OT evaluation    Lower Extremity Assessment Lower Extremity Assessment: Overall WFL for tasks assessed (symmetrical, AROM WNL, strength 4/5 throughout)    Cervical / Trunk Assessment Cervical / Trunk Assessment: Kyphotic  Communication   Communication: No difficulties  Cognition Arousal/Alertness: Awake/alert Behavior During Therapy: WFL for tasks assessed/performed Overall Cognitive Status: Within Functional Limits for tasks assessed    General Comments: pt a&o x4      General Comments  Exercises     Assessment/Plan    PT Assessment Patient needs  continued PT services  PT Problem List Decreased range of motion;Decreased activity tolerance;Decreased balance;Decreased knowledge of use of DME       PT Treatment Interventions DME instruction;Gait training;Stair training;Functional mobility training;Therapeutic activities;Therapeutic exercise;Balance training;Neuromuscular re-education;Patient/family education    PT Goals (Current goals can be found in the Care Plan section)  Acute Rehab PT Goals Patient Stated Goal: "go back home" PT Goal Formulation: With patient Time For Goal Achievement: 10/28/19 Potential to Achieve Goals: Good    Frequency Min 3X/week   Barriers to discharge        Co-evaluation               AM-PAC PT "6 Clicks" Mobility  Outcome Measure Help needed turning from your back to your side while in a flat bed without using bedrails?: None Help needed moving from lying on your back to sitting on the side of a flat bed without using bedrails?: None Help needed moving to and from a bed to a chair (including a wheelchair)?: None Help needed standing up from a chair using your arms (e.g., wheelchair or bedside chair)?: None Help needed to walk in hospital room?: None Help needed climbing 3-5 steps with a railing? : A Little 6 Click Score: 23    End of Session Equipment Utilized During Treatment: Gait belt Activity Tolerance: Patient tolerated treatment well Patient left: in chair;with call bell/phone within reach;with chair alarm set Nurse Communication: Mobility status;Other (comment) (purewick removed and urine cannister full) PT Visit Diagnosis: Unsteadiness on feet (R26.81);Other abnormalities of gait and mobility (R26.89)    Time: 2353-6144 PT Time Calculation (min) (ACUTE ONLY): 19 min   Charges:   PT Evaluation $PT Eval Low Complexity: 1 Low           Tori Robbie Nangle PT, DPT 10/21/19, 9:10 AM [phone]

## 2019-10-22 DIAGNOSIS — J449 Chronic obstructive pulmonary disease, unspecified: Secondary | ICD-10-CM

## 2019-10-22 DIAGNOSIS — I10 Essential (primary) hypertension: Secondary | ICD-10-CM

## 2019-10-22 DIAGNOSIS — F418 Other specified anxiety disorders: Secondary | ICD-10-CM

## 2019-10-22 DIAGNOSIS — E785 Hyperlipidemia, unspecified: Secondary | ICD-10-CM

## 2019-10-22 LAB — BASIC METABOLIC PANEL
Anion gap: 11 (ref 5–15)
BUN: 23 mg/dL — ABNORMAL HIGH (ref 6–20)
CO2: 24 mmol/L (ref 22–32)
Calcium: 7.8 mg/dL — ABNORMAL LOW (ref 8.9–10.3)
Chloride: 99 mmol/L (ref 98–111)
Creatinine, Ser: 1.12 mg/dL — ABNORMAL HIGH (ref 0.44–1.00)
GFR calc Af Amer: >60 mL/min (ref >60)
GFR calc non Af Amer: 56 mL/min — ABNORMAL LOW (ref >60)
Glucose, Bld: 140 mg/dL — ABNORMAL HIGH (ref 70–99)
Potassium: 4.3 mmol/L (ref 3.5–5.1)
Sodium: 134 mmol/L — ABNORMAL LOW (ref 135–145)

## 2019-10-22 LAB — CBC
HCT: 33.2 % — ABNORMAL LOW (ref 36.0–46.0)
Hemoglobin: 10.4 g/dL — ABNORMAL LOW (ref 12.0–15.0)
MCH: 30.8 pg (ref 26.0–34.0)
MCHC: 31.3 g/dL (ref 30.0–36.0)
MCV: 98.2 fL (ref 80.0–100.0)
Platelets: 302 10*3/uL (ref 150–400)
RBC: 3.38 MIL/uL — ABNORMAL LOW (ref 3.87–5.11)
RDW: 12.3 % (ref 11.5–15.5)
WBC: 8.1 10*3/uL (ref 4.0–10.5)
nRBC: 0 % (ref 0.0–0.2)

## 2019-10-22 LAB — MAGNESIUM: Magnesium: 1 mg/dL — ABNORMAL LOW (ref 1.7–2.4)

## 2019-10-22 LAB — GLUCOSE, CAPILLARY
Glucose-Capillary: 116 mg/dL — ABNORMAL HIGH (ref 70–99)
Glucose-Capillary: 150 mg/dL — ABNORMAL HIGH (ref 70–99)

## 2019-10-22 LAB — RPR: RPR Ser Ql: NONREACTIVE

## 2019-10-22 MED ORDER — MAGNESIUM SULFATE 2 GM/50ML IV SOLN
2.0000 g | Freq: Once | INTRAVENOUS | Status: AC
  Administered 2019-10-22: 2 g via INTRAVENOUS
  Filled 2019-10-22: qty 50

## 2019-10-22 MED ORDER — HYDROCODONE-ACETAMINOPHEN 5-325 MG PO TABS: 1.0000 | ORAL_TABLET | Freq: Four times a day (QID) | ORAL | Status: DC | PRN

## 2019-10-22 MED ORDER — MAGNESIUM SULFATE 50 % IJ SOLN: 2.0000 g | Freq: Once | INTRAMUSCULAR | Status: DC

## 2019-10-22 MED ORDER — CLOPIDOGREL BISULFATE 75 MG PO TABS: 75.0000 mg | ORAL_TABLET | Freq: Every day | ORAL | 0 refills | Status: AC

## 2019-10-22 MED ORDER — DOCUSATE SODIUM 100 MG PO CAPS
100.0000 mg | ORAL_CAPSULE | Freq: Two times a day (BID) | ORAL | 0 refills | Status: DC
Start: 2019-10-22 — End: 2021-02-22

## 2019-10-22 MED ORDER — FUROSEMIDE 20 MG PO TABS: 20.0000 mg | ORAL_TABLET | Freq: Two times a day (BID) | ORAL | Status: AC

## 2019-10-22 MED ORDER — ASPIRIN EC 81 MG PO TBEC: 81.0000 mg | DELAYED_RELEASE_TABLET | Freq: Every day | ORAL | 3 refills | Status: AC

## 2019-10-22 MED ORDER — NICOTINE 21 MG/24HR TD PT24: 21.0000 mg | MEDICATED_PATCH | Freq: Every day | TRANSDERMAL | 0 refills | Status: DC

## 2019-10-22 MED ORDER — VITAMIN D3 25 MCG (1000 UNIT) PO TABS
1000.0000 [IU] | ORAL_TABLET | Freq: Every day | ORAL | Status: DC
Start: 2019-10-22 — End: 2020-11-18

## 2019-10-22 NOTE — NC FL2 (Signed)
[place] MEDICAID FL2 LEVEL OF CARE SCREENING TOOL     IDENTIFICATION  Patient Name: [name] [name] Birthdate: 1965-11-18 Sex: female Admission Date (Current Location): 10/20/2019  [place] and [place] Number:  Aaron Edelman [id number] [facility] Facility and Address:  [facility],  [address] [place] [address]      Provider Number: [id number]  Attending Physician Name and Address:  Vassie Loll, MD  Relative Name and Phone Number:  Jimmye Norman (facility administrator) (Other) [phone]    Current Level of Care: SNF Recommended Level of Care: Other (Comment) (Group home) Prior Approval Number:    Date Approved/Denied:   PASRR Number:    Discharge Plan: Other (Comment) (Group home)    Current Diagnoses: Patient Active Problem List   Diagnosis Date Noted  . Hyperlipidemia   . Essential hypertension   . Depression with anxiety   . Chronic obstructive pulmonary disease (HCC)   . Acute renal failure superimposed on stage 3b chronic kidney disease (HCC) 10/21/2019  . Tobacco abuse 10/21/2019  . Sensory disturbance 10/21/2019  . TIA (transient ischemic attack) 10/20/2019  . Altered mental status   . Loss of consciousness (HCC) 09/08/2018  . Encephalopathy 09/08/2018  . 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 Anxiety 04/02/2017  . Acute cystitis 04/02/2017  . Urinary incontinence 01/25/2016  . Fecal incontinence 01/25/2016  . Dysthymia 01/25/2016    Orientation RESPIRATION BLADDER Height & Weight     Self, Place  Normal External catheter Weight: 141 lb 12.1 oz (64.3 kg) Height:  5\' 8"  (172.7 cm)  BEHAVIORAL SYMPTOMS/MOOD NEUROLOGICAL BOWEL NUTRITION STATUS      Continent Diet (Carb modified)  AMBULATORY STATUS COMMUNICATION OF NEEDS Skin   Limited Assist Verbally Normal                       Personal Care Assistance Level of Assistance  Bathing, Feeding, Dressing Bathing Assistance: Limited assistance Feeding  assistance: Limited assistance Dressing Assistance: Limited assistance     Functional Limitations Info  Sight, Hearing, Speech Sight Info: Adequate Hearing Info: Adequate Speech Info: Adequate    SPECIAL CARE FACTORS FREQUENCY                       Contractures Contractures Info: Not present    Additional Factors Info  Code Status, Psychotropic, Allergies Code Status Info: Full code Allergies Info: Latex Psychotropic Info: Wellbutrin, Buspar, Trazodone         Current Medications (10/22/2019):  This is the current hospital active medication list Current Facility-Administered Medications  Medication Dose Route Frequency Provider Last Rate Last Admin  . albuterol (PROVENTIL) (2.5 MG/3ML) 0.083% nebulizer solution 3 mL  3 mL Inhalation Q4H PRN Acheampong, 12/22/2019, MD      . aspirin EC tablet 325 mg  325 mg Oral Daily Genice Rouge, MD   325 mg at 10/22/19 0837  . atorvastatin (LIPITOR) tablet 40 mg  40 mg Oral Q2000 12/22/19, MD   40 mg at 10/21/19 2100  . busPIRone (BUSPAR) tablet 10 mg  10 mg Oral TID 2101, MD   10 mg at 10/22/19 0835  . cholecalciferol (VITAMIN D3) tablet 1,000 Units  1,000 Units Oral Daily Acheampong, 12/22/19, MD   1,000 Units at 10/22/19 [phone]  . clopidogrel (PLAVIX) tablet 75 mg  75 mg Oral Q breakfast 3716, MD   75 mg at 10/22/19 0838  . docusate sodium (COLACE)  capsule 100 mg  100 mg Oral BID Lilia Pro, MD   100 mg at 10/22/19 0835  . heparin injection 5,000 Units  5,000 Units Subcutaneous Q8H Acheampong, Genice Rouge, MD   5,000 Units at 10/22/19 0442  . HYDROcodone-acetaminophen (NORCO/VICODIN) 5-325 MG per tablet 1 tablet  1 tablet Oral Q6H PRN Acheampong, Genice Rouge, MD   1 tablet at 10/22/19 0441  . insulin aspart (novoLOG) injection 0-9 Units  0-9 Units Subcutaneous TID WC Acheampong, Genice Rouge, MD   1 Units at 10/22/19 1231  . mometasone-formoterol (DULERA) 100-5 MCG/ACT inhaler 2 puff  2 puff Inhalation BID  Acheampong, Genice Rouge, MD   2 puff at 10/22/19 0735  . nicotine (NICODERM CQ - dosed in mg/24 hours) patch 21 mg  21 mg Transdermal Daily Acheampong, Genice Rouge, MD   21 mg at 10/22/19 0840  . ondansetron (ZOFRAN) tablet 4 mg  4 mg Oral Q6H PRN Acheampong, Genice Rouge, MD       Or  . ondansetron (ZOFRAN) injection 4 mg  4 mg Intravenous Q6H PRN Acheampong, Genice Rouge, MD      . pantoprazole (PROTONIX) EC tablet 40 mg  40 mg Oral Daily Acheampong, Genice Rouge, MD   40 mg at 10/22/19 0836  . sodium chloride flush (NS) 0.9 % injection 3 mL  3 mL Intravenous Once Sofia, Leslie K, PA-C      . traZODone (DESYREL) tablet 100 mg  100 mg Oral QHS Lilia Pro, MD   100 mg at 10/21/19 2101     Discharge Medications: Please see discharge summary for a list of discharge medications.  Relevant Imaging Results:  Relevant Lab Results:   Additional Information Pt SSN: [id number]  Barry Brunner, LCSW

## 2019-10-22 NOTE — Progress Notes (Addendum)
Physical Therapy Treatment Patient Details Name: [name] [name] MRN: [id number] DOB: [birth year] Today's Date: 10/22/2019    History of Present Illness 54 y.o. 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    PT Comments    The patient was mildly impulsive at the start of today's session but was able to consistently follow multi-step commands. She was independent with supine to sit transfer. The patient had significant BLE tightness resulting in significant knee extension limitation during therapeutic exercise today. She had the strength and endurance to complete all exercises, but was not successful with form on all exercises due to ROM limitations. She was able to perform sit to stand min assist w RW, but was not able to achieve erect posture in RW. During the 40 feet ambulation w RW min assist, the patient displayed crouch gait with irregular B foot placement. When asked about prior gait pattern, the patient reported she did not previously walk like this. Due to BLE weakness and fatigue, the patient was returned to the room. Patient tolerated sitting up in chair - RN notified. PLAN: The patient will continue to benefit from skilled physical therapy services in hospital at recommended venue below in order to improve balance, gait, and ADL's to promote independence in functional activities.      Follow Up Recommendations  No PT follow up;Supervision/Assistance - 24 hour     Equipment Recommendations  None recommended by PT    Recommendations for Other Services       Precautions / Restrictions Precautions Precautions: Fall Restrictions Weight Bearing Restrictions: No    Mobility  Bed Mobility Overal bed mobility: Modified Independent             General bed mobility comments: comes to sitting EOB without physical assistance or cues  Transfers Overall transfer level: Needs assistance Equipment  used: Rolling walker (2 wheeled) Transfers: Sit to/from Stand Sit to Stand: Min assist;Min guard         General transfer comment: BUE assisting to power up w min assist from PT, BLE resulting in flexed trunk w heavy reliance on RW  Ambulation/Gait Ambulation/Gait assistance: Min guard Gait Distance (Feet): 40 Feet Assistive device: Rolling walker (2 wheeled) Gait Pattern/deviations: Step-through pattern;Decreased stride length;Trunk flexed Gait velocity: decreased   General Gait Details: slow, flat foot steps with crouched posture noted throughout gait cycle, VC's to attempt upright posture, pt reports she is not able to stand upright   Stairs             Wheelchair Mobility    Modified Rankin (Stroke Patients Only)       Balance Overall balance assessment: Needs assistance Sitting-balance support: Feet supported;No upper extremity supported Sitting balance-Leahy Scale: Good Sitting balance - Comments: seated EOB   Standing balance support: During functional activity;Bilateral upper extremity supported Standing balance-Leahy Scale: Fair Standing balance comment: RW for steadying; irregular foot placement for BLE on each step                            Cognition Arousal/Alertness: Awake/alert Behavior During Therapy: WFL for tasks assessed/performed;Impulsive Overall Cognitive Status: History of cognitive impairments - at baseline                                 General Comments: pt a&o x4  Exercises General Exercises - Lower Extremity Long Arc Quad: AROM;Strengthening;Seated;Both;10 reps Hip Flexion/Marching: AROM;Strengthening;Seated;Both;10 reps Toe Raises: AROM;Strengthening;Seated;Both;10 reps Heel Raises: AROM;Strengthening;Seated;Both;10 reps    General Comments        Pertinent Vitals/Pain Pain Assessment: No/denies pain    Home Living                      Prior Function            PT Goals  (current goals can now be found in the care plan section) Acute Rehab PT Goals Patient Stated Goal: "go back home" PT Goal Formulation: With patient Time For Goal Achievement: 10/28/19 Potential to Achieve Goals: Good Progress towards PT goals: Progressing toward goals    Frequency    Min 3X/week      PT Plan      Co-evaluation              AM-PAC PT "6 Clicks" Mobility   Outcome Measure  Help needed turning from your back to your side while in a flat bed without using bedrails?: None Help needed moving from lying on your back to sitting on the side of a flat bed without using bedrails?: None Help needed moving to and from a bed to a chair (including a wheelchair)?: A Little Help needed standing up from a chair using your arms (e.g., wheelchair or bedside chair)?: A Little Help needed to walk in hospital room?: A Little Help needed climbing 3-5 steps with a railing? : A Lot 6 Click Score: 19    End of Session Equipment Utilized During Treatment: Gait belt Activity Tolerance: Patient tolerated treatment well Patient left: in chair;with call bell/phone within reach Nurse Communication: Mobility status PT Visit Diagnosis: Unsteadiness on feet (R26.81);Other abnormalities of gait and mobility (R26.89);Muscle weakness (generalized) (M62.81);Ataxic gait (R26.0)     Time:  -     Charges:                        11:58 AM , 10/22/19 Lorin Picket, SPT Physical Therapy with [place]  [facility] [phone] office  During this treatment session, the therapist was present, participating in and directing the treatment.  1:50 PM, 10/22/19 Ocie Bob, MPT Physical Therapist with [facility] 336 [phone] office [phone] mobile phone

## 2019-10-22 NOTE — Discharge Summary (Signed)
Physician Discharge Summary  Jerilee HohJennifer R Eastmond MVH:[id number]:[id number] DOB: 03-04-1965 DOA: 10/20/2019  PCP: Patient, No Pcp Per  Admit date: 10/20/2019 Discharge date: 10/22/2019  Time spent: 35 minutes  Recommendations for Outpatient Follow-up:  1. Repeat BMET to follow electrolytes and renal function. 2. Reassess BP and adjust antihypertensive agents.    Discharge Diagnoses:  Active Problems:   TIA (transient ischemic attack)   Acute renal failure superimposed on stage 3b chronic kidney disease (HCC)   Tobacco abuse   Sensory disturbance   Hyperlipidemia   Essential hypertension   Depression with anxiety   Chronic obstructive pulmonary disease (HCC)   Discharge Condition: Stable and improved.  Discharged home with instruction to follow-up with PCP in 10 days and with neurology service in 6 weeks.  CODE STATUS: Full code  Diet recommendation: Heart healthy/modified carbohydrate diet  Filed Weights   10/21/19 0001  Weight: 64.3 kg    History of present illness:  54 year old female with a history of depression, diabetes mellitus type 2, hypertension, and tobacco abuse presenting with right hand numbness and weakness that began on 10/20/2019 around breakfast time.  The patient stated that she had some weakness causing her to drop her cough at the time.  The episode lasted 30 minutes.  She had a similar episode around 4:30 PM on 10/20/2019.  Once again, the episode lasted about 30 minutes.  She denied any headache, dysarthria, other focal extremity weakness, visual disturbance.  She denies any fevers, chills, chest pain, coughing, hemoptysis, shortness of breath, nausea, vomiting, diarrhea, abdominal pain. In the emergency department, the patient was afebrile hemodynamically stable.  Blood pressures were somewhat soft with systolic blood pressure in the upper 90s and low 100s.  Oxygen saturation was 100% room air.  BMP showed a serum creatinine of 2.59 which is above her usual baseline.  CBC  was essentially unremarkable with hemoglobin 10.8.  CT of the brain was negative.  Neurology was consulted to assist with management.  Hospital Course:  1-sensory disturbances/right hand weakness/TIA -Work-up negative for acute stroke -After discussing with neurology service symptoms suggesting TIA; complete resolution of symptoms at time of discharge -Patient will complete 30 days of Plavix and aspirin with subsequent use of aspirin on daily basis. -Follow-up in 6 weeks with neurology service. -Heart healthy diet and continue risk factor modifications has been instructed (control of blood pressure, cholesterol and smoking cessation).  2-chronic renal failure -Chronic kidney disease a stage IIIb at baseline -On presentation creatinine peaked 2.5 -Baseline creatinine around 1.6. -After holding nephrotoxic agents and providing fluid resuscitation, renal function back to baseline. -Safe to resume the use of lisinopril and Lasix at adjusted dose -Patient advised to maintain adequate hydration and to follow heart healthy diet.  3-essential hypertension -Patient was allowed permissive hypertension in the first 48 hours after TIA. -Amlodipine, lisinopril and Lasix has been resumed after discharge -Patient advised to follow heart healthy diet.  4-type 2 diabetes mellitus -We will resume the use of Metformin -Patient advised to follow modified carbohydrate diet -A1C 5.1  5-Tobacco abuse -cessation counseling has been provided -Nicotine patch given at discharge.  6-HLD -continue statins  7-depression/anxiety -Continue BuSpar and trazodone -No suicidal ideation hallucination -Stable mood.   Procedures:  See below for x-ray report   US carotid bilateral: Minor carotid atherosclerosis. No hemodynamically significant ICA stenosis. Degree of narrowing less than 50% bilaterally by ultrasound criteria.  Patent normal antegrade vertebral flow bilaterally   2D echo 1. Left  ventricular ejection fraction, by estimation,  is 60 to 65%. The  left ventricle has normal function. The left ventricle has no regional  wall motion abnormalities. There is mild left ventricular hypertrophy.  Left ventricular diastolic parameters  were normal.  2. Right ventricular systolic function is normal. The right ventricular  size is normal. There is normal pulmonary artery systolic pressure.  3. The mitral valve is normal in structure. Trivial mitral valve  regurgitation. No evidence of mitral stenosis.  4. The aortic valve is tricuspid. Aortic valve regurgitation is not  visualized. No aortic stenosis is present.  5. The inferior vena cava is normal in size with greater than 50%  respiratory variability, suggesting right atrial pressure of 3 mmHg.    Consultations:  Neurology service  Discharge Exam: Vitals:   10/22/19 0449 10/22/19 0735  BP: (!) 133/91   Pulse: 80   Resp: 18   Temp: 98.1 F (36.7 C)   SpO2: 98% 98%    General: Afebrile, no chest pain, no nausea, no vomiting, reports complete resolution of her right-sided weakness; back to baseline and in no acute distress. Cardiovascular: S1 and S2, no rubs, no gallops, no JVD. Respiratory: Scattered rhonchi, no wheezing, no crackles, no using accessory muscles.  Good O2 sat on room air. Abdomen: Soft, nontender, nondistended, positive bowel sounds Extremities: No cyanosis or clubbing.  Discharge Instructions   Discharge Instructions    Diet - low sodium heart healthy   Complete by: As directed    Discharge instructions   Complete by: As directed    Take medications as prescribed Follow heart healthy diet Arrange follow-up with Dr. [name] in 6 weeks (neurologist) Arrange follow-up in 10 days with PCP.     Allergies as of 10/22/2019      Reactions   Latex Rash   Severe itching.      Medication List    TAKE these medications   albuterol 108 (90 Base) MCG/ACT inhaler Commonly known as: VENTOLIN  HFA Inhale 2 puffs into the lungs every 4 (four) hours as needed for wheezing or shortness of breath.   amLODipine 10 MG tablet Commonly known as: NORVASC Take 10 mg by mouth every morning.   aspirin EC 81 MG tablet Take 1 tablet (81 mg total) by mouth daily. Swallow whole.   atorvastatin 40 MG tablet Commonly known as: LIPITOR Take 40 mg by mouth daily at 8 pm. (2000)   busPIRone 10 MG tablet Commonly known as: BUSPAR Take 10 mg by mouth 3 (three) times daily.   cholecalciferol 25 MCG (1000 UNIT) tablet Commonly known as: VITAMIN D3 Take 1,000 Units by mouth every morning.   cholecalciferol 25 MCG (1000 UNIT) tablet Commonly known as: VITAMIN D Take 1 tablet (1,000 Units total) by mouth daily. (0800)   clopidogrel 75 MG tablet Commonly known as: PLAVIX Take 1 tablet (75 mg total) by mouth daily with breakfast. Start taking on: October 23, 2019   diclofenac sodium 1 % Gel Commonly known as: VOLTAREN Apply 2 g topically 4 (four) times daily as needed. What changed: when to take this   docusate sodium 100 MG capsule Commonly known as: COLACE Take 1 capsule (100 mg total) by mouth 2 (two) times daily.   ferrous sulfate 325 (65 FE) MG tablet Take 1 tablet (325 mg total) by mouth 2 (two) times daily with a meal. What changed: when to take this   Fluticasone-Salmeterol 100-50 MCG/DOSE Aepb Commonly known as: ADVAIR Inhale 1 puff into the lungs 2 (two) times daily.  furosemide 20 MG tablet Commonly known as: LASIX Take 1 tablet (20 mg total) by mouth 2 (two) times daily. (0800) What changed: how much to take   HYDROcodone-acetaminophen 5-325 MG tablet Commonly known as: NORCO/VICODIN Take 1-2 tablets by mouth every 6 (six) hours as needed for severe pain (pain score 4-6). What changed:   when to take this  reasons to take this   lisinopril 20 MG tablet Commonly known as: ZESTRIL Take 20 mg by mouth daily. (0800)   metFORMIN 500 MG 24 hr tablet Commonly  known as: GLUCOPHAGE-XR Take 1,000 mg by mouth 2 (two) times daily. (0800 & 2000)   nicotine 21 mg/24hr patch Commonly known as: NICODERM CQ - dosed in mg/24 hours Place 1 patch (21 mg total) onto the skin daily. Start taking on: October 23, 2019   pantoprazole 40 MG tablet Commonly known as: PROTONIX Take 1 tablet (40 mg total) by mouth daily.   traMADol 50 MG tablet Commonly known as: ULTRAM Take 50 mg by mouth 4 (four) times daily.   traZODone 100 MG tablet Commonly known as: DESYREL Take 100 mg by mouth at bedtime.      Allergies  Allergen Reactions  . Latex Rash    Severe itching.    Follow-up Information    Beryle Beams, MD. Schedule an appointment as soon as possible for a visit in 6 week(s).   Specialty: Neurology Contact information: [address] [place] [address] [phone]               The results of significant diagnostics from this hospitalization (including imaging, microbiology, ancillary and laboratory) are listed below for reference.    Significant Diagnostic Studies: CT HEAD WO CONTRAST  Result Date: 10/20/2019 CLINICAL DATA:  New onset weakness. EXAM: CT HEAD WITHOUT CONTRAST TECHNIQUE: Contiguous axial images were obtained from the base of the skull through the vertex without intravenous contrast. COMPARISON:  September 07, 2018 FINDINGS: Brain: There is mild cerebral atrophy with widening of the extra-axial spaces and ventricular dilatation. There are areas of decreased attenuation within the white matter tracts of the supratentorial brain, consistent with microvascular disease changes. Small chronic bilateral basal ganglia lacunar infarcts are seen. Vascular: No hyperdense vessel or unexpected calcification. Skull: Normal. Negative for fracture or focal lesion. Sinuses/Orbits: There is a small right maxillary sinus air-fluid level. No acute osseous abnormalities are seen. Other: None. IMPRESSION: 1. Generalized cerebral atrophy. 2. No  acute intracranial abnormality. Electronically Signed   By: Aram Candela M.D.   On: 10/20/2019 18:47   MR BRAIN WO CONTRAST  Result Date: 10/21/2019 CLINICAL DATA:  Transient ischemic attack (TIA). Additional history provided: Right hand weakness. EXAM: MRI HEAD WITHOUT CONTRAST TECHNIQUE: Multiplanar, multiecho pulse sequences of the brain and surrounding structures were obtained without intravenous contrast. COMPARISON:  Non-contrast head CT 10/20/2019. Brain MRI 09/09/2018 FINDINGS: The patient was unable to tolerate the full examination. As a result, only axial and coronal diffusion-weighted sequences could be obtained. There is moderate/severe motion degradation of the axial diffusion-weighted sequence and mild motion degradation of the coronal diffusion-weighted sequence. There is no evidence of acute infarct. IMPRESSION: Prematurely terminated examination consisting of only axial and coronal diffusion-weighted imaging. The axial DWI sequence is moderate to severely motion degraded. The coronal DWI sequence is mildly motion degraded. No evidence of acute infarct. Electronically Signed   By: Jackey Loge DO   On: 10/21/2019 11:09   US RENAL  Result Date: 10/21/2019 CLINICAL DATA:  Acute renal failure.  Urinary incontinence. EXAM: RENAL / URINARY TRACT ULTRASOUND COMPLETE COMPARISON:  No prior. FINDINGS: Right Kidney: Renal measurements: 11.1 x 3.9 x 6.7 cm = volume: 151.3 mL. Increased echogenicity cannot be excluded. No mass or hydronephrosis visualized. Proximal right ureter may be mildly dilated. Left Kidney: Renal measurements: 10.2 x 5.7 x 4.6 cm = volume: 138.3 mL. Mild increased echogenicity cannot be excluded. No mass or hydronephrosis visualized. Bladder: Appears normal for degree of bladder distention. Other: None. IMPRESSION: 1. Mild increased echogenicity both kidneys cannot be excluded. Chronic medical renal disease cannot be excluded. 2. Proximal right ureter may be slightly dilated.  No hydronephrosis. No bladder distention. Electronically Signed   By: Maisie Fus  Register   On: 10/21/2019 10:59   US Carotid Bilateral (at [facility] and [facility] only)  Result Date: 10/21/2019 CLINICAL DATA:  Weakness, TIA EXAM: BILATERAL CAROTID DUPLEX ULTRASOUND TECHNIQUE: Wallace Cullens scale imaging, color Doppler and duplex ultrasound were performed of bilateral carotid and vertebral arteries in the neck. COMPARISON:  None. FINDINGS: Criteria: Quantification of carotid stenosis is based on velocity parameters that correlate the residual internal carotid diameter with NASCET-based stenosis levels, using the diameter of the distal internal carotid lumen as the denominator for stenosis measurement. The following velocity measurements were obtained: RIGHT ICA: 57/25 cm/sec CCA: 44/15 cm/sec SYSTOLIC ICA/CCA RATIO:  1.3 ECA: 42 cm/sec LEFT ICA: 45/20 cm/sec CCA: 52/16 cm/sec SYSTOLIC ICA/CCA RATIO:  0.9 ECA: 38 cm/sec RIGHT CAROTID ARTERY: Intimal thickening and mild atherosclerotic change at the bifurcation. Despite this, no hemodynamically significant right ICA stenosis, velocity elevation, turbulent flow. Degree of narrowing less than 50% by ultrasound criteria. RIGHT VERTEBRAL ARTERY:  Normal antegrade flow LEFT CAROTID ARTERY: Minor intimal thickening and similar mild atherosclerotic change. No hemodynamically significant left ICA stenosis, velocity elevation, turbulent flow. Degree of narrowing also less than 50% by ultrasound criteria. LEFT VERTEBRAL ARTERY:  Normal antegrade flow IMPRESSION: Minor carotid atherosclerosis. No hemodynamically significant ICA stenosis. Degree of narrowing less than 50% bilaterally by ultrasound criteria. Patent normal antegrade vertebral flow bilaterally Electronically Signed   By: Judie Petit.  Shick M.D.   On: 10/21/2019 16:31   ECHOCARDIOGRAM COMPLETE  Result Date: 10/21/2019    ECHOCARDIOGRAM REPORT   Patient Name:   MAMTA RIMMER Diers Date of Exam: 10/21/2019 Medical Rec #:  [id number]          Height:       68.0 in Accession #:    [id number]        Weight:       141.8 lb Date of Birth:  [birth year]        BSA:          1.766 m Patient Age:    53 years          BP:           119/89 mmHg Patient Gender: F                 HR:           74 bpm. [facility] Procedure: 2D Echo Indications:    TIA 435.9 / G45.9  History:        Patient has prior history of Echocardiogram examinations, most                 recent 09/09/2018. TIA; Risk Factors:Current Smoker. Acute renal                 failure, S/P TKR (total knee replacement) using cement, right.  Sonographer:    Jeryl Columbia RDCS (AE) Referring Phys: [id number] Genice Rouge ACHEAMPONG IMPRESSIONS  1. Left ventricular ejection fraction, by estimation, is 60 to 65%. The left ventricle has normal function. The left ventricle has no regional wall motion abnormalities. There is mild left ventricular hypertrophy. Left ventricular diastolic parameters were normal.  2. Right ventricular systolic function is normal. The right ventricular size is normal. There is normal pulmonary artery systolic pressure.  3. The mitral valve is normal in structure. Trivial mitral valve regurgitation. No evidence of mitral stenosis.  4. The aortic valve is tricuspid. Aortic valve regurgitation is not visualized. No aortic stenosis is present.  5. The inferior vena cava is normal in size with greater than 50% respiratory variability, suggesting right atrial pressure of 3 mmHg. FINDINGS  Left Ventricle: Left ventricular ejection fraction, by estimation, is 60 to 65%. The left ventricle has normal function. The left ventricle has no regional wall motion abnormalities. The left ventricular internal cavity size was normal in size. There is  mild left ventricular hypertrophy. Left ventricular diastolic parameters were normal. Right Ventricle: The right ventricular size is normal. No increase in right ventricular wall thickness. Right ventricular systolic function is normal. There is  normal pulmonary artery systolic pressure. The tricuspid regurgitant velocity is 1.94 m/s, and  with an assumed right atrial pressure of 10 mmHg, the estimated right ventricular systolic pressure is 25.1 mmHg. Left Atrium: Left atrial size was normal in size. Right Atrium: Right atrial size was normal in size. Pericardium: There is no evidence of pericardial effusion. Mitral Valve: The mitral valve is normal in structure. Trivial mitral valve regurgitation. No evidence of mitral valve stenosis. Tricuspid Valve: The tricuspid valve is normal in structure. Tricuspid valve regurgitation is trivial. No evidence of tricuspid stenosis. Aortic Valve: The aortic valve is tricuspid. Aortic valve regurgitation is not visualized. No aortic stenosis is present. Aortic valve mean gradient measures 2.3 mmHg. Aortic valve peak gradient measures 4.4 mmHg. Aortic valve area, by VTI measures 4.13 cm. Pulmonic Valve: The pulmonic valve was not well visualized. Pulmonic valve regurgitation is not visualized. No evidence of pulmonic stenosis. Aorta: The aortic root is normal in size and structure. Pulmonary Artery: Indeterminant PASP, inadequate TR jet. Venous: The inferior vena cava is normal in size with greater than 50% respiratory variability, suggesting right atrial pressure of 3 mmHg. IAS/Shunts: No atrial level shunt detected by color flow Doppler.  LEFT VENTRICLE PLAX 2D LVIDd:         3.84 cm  Diastology LVIDs:         2.44 cm  LV e' lateral:   11.90 cm/s LV PW:         1.10 cm  LV E/e' lateral: 3.9 LV IVS:        1.23 cm  LV e' medial:    7.40 cm/s LVOT diam:     2.40 cm  LV E/e' medial:  6.2 LV SV:         77 LV SV Index:   44 LVOT Area:     4.52 cm  RIGHT VENTRICLE RV S prime:     11.20 cm/s TAPSE (M-mode): 1.6 cm LEFT ATRIUM             Index       RIGHT ATRIUM          Index LA diam:        3.90 cm 2.21 cm/m  RA Area:     9.82 cm LA  Vol [facility]):   50.2 ml 28.43 ml/m RA Volume:   20.00 ml 11.33 ml/m LA Vol (A4C):   41.8  ml 23.67 ml/m LA Biplane Vol: 47.7 ml 27.02 ml/m  AORTIC VALVE AV Area (Vmax):    3.73 cm AV Area (Vmean):   3.55 cm AV Area (VTI):     4.13 cm AV Vmax:           104.71 cm/s AV Vmean:          71.984 cm/s AV VTI:            0.188 m AV Peak Grad:      4.4 mmHg AV Mean Grad:      2.3 mmHg LVOT Vmax:         86.42 cm/s LVOT Vmean:        56.543 cm/s LVOT VTI:          0.171 m LVOT/AV VTI ratio: 0.91  AORTA Ao Root diam: 3.10 cm MITRAL VALVE               TRICUSPID VALVE MV Area (PHT): 2.48 cm    TR Peak grad:   15.1 mmHg MV Decel Time: 306 msec    TR Vmax:        194.00 cm/s MV E velocity: 45.90 cm/s MV A velocity: 49.80 cm/s  SHUNTS MV E/A ratio:  0.92        Systemic VTI:  0.17 m                            Systemic Diam: 2.40 cm Dina Rich MD Electronically signed by Dina Rich MD Signature Date/Time: 10/21/2019/3:07:40 PM    Final     Microbiology: Recent Results (from the past 240 hour(s))  SARS Coronavirus 2 by RT PCR (hospital order, performed in [facility] hospital lab) Nasopharyngeal Nasopharyngeal Swab     Status: None   Collection Time: 10/20/19  7:18 PM   Specimen: Nasopharyngeal Swab  Result Value Ref Range Status   SARS Coronavirus 2 NEGATIVE NEGATIVE Final    Comment: (NOTE) SARS-CoV-2 target nucleic acids are NOT DETECTED.  The SARS-CoV-2 RNA is generally detectable in upper and lower respiratory specimens during the acute phase of infection. The lowest concentration of SARS-CoV-2 viral copies this assay can detect is 250 copies / mL. A negative result does not preclude SARS-CoV-2 infection and should not be used as the sole basis for treatment or other patient management decisions.  A negative result may occur with improper specimen collection / handling, submission of specimen other than nasopharyngeal swab, presence of viral mutation(s) within the areas targeted by this assay, and inadequate number of viral copies (<250 copies / mL). A negative result must be  combined with clinical observations, patient history, and epidemiological information.  Fact Sheet for Patients:   BoilerBrush.com.cy  Fact Sheet for Healthcare Providers: https://pope.com/  This test is not yet approved or  cleared by the [place] FDA and has been authorized for detection and/or diagnosis of SARS-CoV-2 by FDA under an Emergency Use Authorization (EUA).  This EUA will remain in effect (meaning this test can be used) for the duration of the COVID-19 declaration under Section 564(b)(1) of the Act, 21 U.S.C. section 360bbb-3(b)(1), unless the authorization is terminated or revoked sooner.  Performed at [facility], [address] [place], [place] [address]      Labs: Basic Metabolic Panel: Recent Labs  Lab 10/20/19 1800  10/21/19 0620 10/22/19 0541  NA 136 135 134*  K 4.5 4.2 4.3  CL 100 102 99  CO2 21* 23 24  GLUCOSE 82 79 140*  BUN 46* 39* 23*  CREATININE 2.59* 1.93* 1.12*  CALCIUM 8.5* 8.0* 7.8*  MG  --   --  1.0*   Liver Function Tests: Recent Labs  Lab 10/20/19 1800  AST 18  ALT 9  ALKPHOS 56  BILITOT 0.5  PROT 7.9  ALBUMIN 3.9   CBC: Recent Labs  Lab 10/20/19 1800 10/21/19 0620 10/22/19 0541  WBC 7.8 6.7 8.1  NEUTROABS 4.7  --   --   HGB 10.8* 9.8* 10.4*  HCT 34.2* 30.8* 33.2*  MCV 98.8 98.4 98.2  PLT 307 287 302   CBG: Recent Labs  Lab 10/21/19 1158 10/21/19 1608 10/21/19 2054 10/22/19 0755 10/22/19 1159  GLUCAP 149* 110* 146* 116* 150*    Signed:  Vassie Loll MD.  [facility] 10/22/2019, 12:57 PM

## 2019-10-23 LAB — HOMOCYSTEINE: Homocysteine: 38.4 umol/L — ABNORMAL HIGH (ref 0.0–14.5)

## 2020-01-01 ENCOUNTER — Inpatient Hospital Stay (HOSPITAL_COMMUNITY)
Admission: EM | Admit: 2020-01-01 | Discharge: 2020-01-07 | DRG: 871 | Disposition: A | Discharge status: Home Health | Payer: Medicaid Other | Attending: Internal Medicine | Admitting: Internal Medicine

## 2020-01-01 ENCOUNTER — Emergency Department (HOSPITAL_COMMUNITY): Payer: Medicaid Other

## 2020-01-01 ENCOUNTER — Other Ambulatory Visit: Payer: Self-pay

## 2020-01-01 ENCOUNTER — Encounter (HOSPITAL_COMMUNITY): Payer: Self-pay | Admitting: Emergency Medicine

## 2020-01-01 DIAGNOSIS — F17210 Nicotine dependence, cigarettes, uncomplicated: Secondary | ICD-10-CM | POA: Diagnosis present

## 2020-01-01 DIAGNOSIS — Z20822 Contact with and (suspected) exposure to covid-19: Secondary | ICD-10-CM | POA: Diagnosis present

## 2020-01-01 DIAGNOSIS — A4151 Sepsis due to Escherichia coli [E. coli]: Secondary | ICD-10-CM | POA: Diagnosis not present

## 2020-01-01 DIAGNOSIS — N179 Acute kidney failure, unspecified: Secondary | ICD-10-CM | POA: Diagnosis not present

## 2020-01-01 DIAGNOSIS — Z7902 Long term (current) use of antithrombotics/antiplatelets: Secondary | ICD-10-CM

## 2020-01-01 DIAGNOSIS — I129 Hypertensive chronic kidney disease with stage 1 through stage 4 chronic kidney disease, or unspecified chronic kidney disease: Secondary | ICD-10-CM | POA: Diagnosis present

## 2020-01-01 DIAGNOSIS — R54 Age-related physical debility: Secondary | ICD-10-CM | POA: Diagnosis present

## 2020-01-01 DIAGNOSIS — E875 Hyperkalemia: Secondary | ICD-10-CM | POA: Diagnosis present

## 2020-01-01 DIAGNOSIS — L89329 Pressure ulcer of left buttock, unspecified stage: Secondary | ICD-10-CM | POA: Diagnosis present

## 2020-01-01 DIAGNOSIS — R6520 Severe sepsis without septic shock: Secondary | ICD-10-CM

## 2020-01-01 DIAGNOSIS — A419 Sepsis, unspecified organism: Secondary | ICD-10-CM | POA: Diagnosis not present

## 2020-01-01 DIAGNOSIS — I959 Hypotension, unspecified: Secondary | ICD-10-CM | POA: Diagnosis present

## 2020-01-01 DIAGNOSIS — N390 Urinary tract infection, site not specified: Secondary | ICD-10-CM | POA: Diagnosis present

## 2020-01-01 DIAGNOSIS — R64 Cachexia: Secondary | ICD-10-CM | POA: Diagnosis present

## 2020-01-01 DIAGNOSIS — E785 Hyperlipidemia, unspecified: Secondary | ICD-10-CM | POA: Diagnosis present

## 2020-01-01 DIAGNOSIS — Z79891 Long term (current) use of opiate analgesic: Secondary | ICD-10-CM

## 2020-01-01 DIAGNOSIS — E119 Type 2 diabetes mellitus without complications: Secondary | ICD-10-CM | POA: Diagnosis not present

## 2020-01-01 DIAGNOSIS — N1832 Chronic kidney disease, stage 3b: Secondary | ICD-10-CM | POA: Diagnosis present

## 2020-01-01 DIAGNOSIS — L8990 Pressure ulcer of unspecified site, unspecified stage: Secondary | ICD-10-CM | POA: Insufficient documentation

## 2020-01-01 DIAGNOSIS — Z91040 Latex allergy status: Secondary | ICD-10-CM

## 2020-01-01 DIAGNOSIS — J449 Chronic obstructive pulmonary disease, unspecified: Secondary | ICD-10-CM | POA: Diagnosis present

## 2020-01-01 DIAGNOSIS — D631 Anemia in chronic kidney disease: Secondary | ICD-10-CM

## 2020-01-01 DIAGNOSIS — N189 Chronic kidney disease, unspecified: Secondary | ICD-10-CM

## 2020-01-01 DIAGNOSIS — I878 Other specified disorders of veins: Secondary | ICD-10-CM | POA: Diagnosis present

## 2020-01-01 DIAGNOSIS — R1084 Generalized abdominal pain: Secondary | ICD-10-CM

## 2020-01-01 DIAGNOSIS — R1310 Dysphagia, unspecified: Secondary | ICD-10-CM | POA: Diagnosis not present

## 2020-01-01 DIAGNOSIS — E1122 Type 2 diabetes mellitus with diabetic chronic kidney disease: Secondary | ICD-10-CM | POA: Diagnosis present

## 2020-01-01 DIAGNOSIS — Z96651 Presence of right artificial knee joint: Secondary | ICD-10-CM | POA: Diagnosis present

## 2020-01-01 DIAGNOSIS — N170 Acute kidney failure with tubular necrosis: Secondary | ICD-10-CM | POA: Diagnosis present

## 2020-01-01 DIAGNOSIS — E43 Unspecified severe protein-calorie malnutrition: Secondary | ICD-10-CM | POA: Diagnosis present

## 2020-01-01 DIAGNOSIS — F418 Other specified anxiety disorders: Secondary | ICD-10-CM | POA: Diagnosis present

## 2020-01-01 DIAGNOSIS — Z79899 Other long term (current) drug therapy: Secondary | ICD-10-CM

## 2020-01-01 DIAGNOSIS — Z7951 Long term (current) use of inhaled steroids: Secondary | ICD-10-CM

## 2020-01-01 DIAGNOSIS — Z7984 Long term (current) use of oral hypoglycemic drugs: Secondary | ICD-10-CM

## 2020-01-01 DIAGNOSIS — Z8249 Family history of ischemic heart disease and other diseases of the circulatory system: Secondary | ICD-10-CM

## 2020-01-01 DIAGNOSIS — E11649 Type 2 diabetes mellitus with hypoglycemia without coma: Secondary | ICD-10-CM

## 2020-01-01 DIAGNOSIS — D6489 Other specified anemias: Secondary | ICD-10-CM | POA: Diagnosis present

## 2020-01-01 DIAGNOSIS — Z8673 Personal history of transient ischemic attack (TIA), and cerebral infarction without residual deficits: Secondary | ICD-10-CM

## 2020-01-01 DIAGNOSIS — Z7982 Long term (current) use of aspirin: Secondary | ICD-10-CM

## 2020-01-01 DIAGNOSIS — Z6821 Body mass index (BMI) 21.0-21.9, adult: Secondary | ICD-10-CM

## 2020-01-01 LAB — URINALYSIS, ROUTINE W REFLEX MICROSCOPIC
Bilirubin Urine: NEGATIVE
Glucose, UA: NEGATIVE mg/dL
Ketones, ur: NEGATIVE mg/dL
Nitrite: NEGATIVE
Protein, ur: 100 mg/dL — AB
Specific Gravity, Urine: 1.014 (ref 1.005–1.030)
pH: 5 (ref 5.0–8.0)

## 2020-01-01 LAB — CBC WITH DIFFERENTIAL/PLATELET
Abs Immature Granulocytes: 0.07 10*3/uL (ref 0.00–0.07)
Basophils Absolute: 0 10*3/uL (ref 0.0–0.1)
Basophils Relative: 0 %
Eosinophils Absolute: 0 10*3/uL (ref 0.0–0.5)
Eosinophils Relative: 0 %
HCT: 32 % — ABNORMAL LOW (ref 36.0–46.0)
Hemoglobin: 9.8 g/dL — ABNORMAL LOW (ref 12.0–15.0)
Immature Granulocytes: 0 %
Lymphocytes Relative: 6 %
Lymphs Abs: 0.9 10*3/uL (ref 0.7–4.0)
MCH: 30.4 pg (ref 26.0–34.0)
MCHC: 30.6 g/dL (ref 30.0–36.0)
MCV: 99.4 fL (ref 80.0–100.0)
Monocytes Absolute: 0.6 10*3/uL (ref 0.1–1.0)
Monocytes Relative: 4 %
Neutro Abs: 14 10*3/uL — ABNORMAL HIGH (ref 1.7–7.7)
Neutrophils Relative %: 90 %
Platelets: 420 10*3/uL — ABNORMAL HIGH (ref 150–400)
RBC: 3.22 MIL/uL — ABNORMAL LOW (ref 3.87–5.11)
RDW: 12.9 % (ref 11.5–15.5)
WBC: 15.6 10*3/uL — ABNORMAL HIGH (ref 4.0–10.5)
nRBC: 0 % (ref 0.0–0.2)

## 2020-01-01 LAB — RAPID URINE DRUG SCREEN, HOSP PERFORMED
Amphetamines: NOT DETECTED
Barbiturates: NOT DETECTED
Benzodiazepines: NOT DETECTED
Cocaine: NOT DETECTED
Opiates: NOT DETECTED
Tetrahydrocannabinol: NOT DETECTED

## 2020-01-01 LAB — COMPREHENSIVE METABOLIC PANEL
ALT: 40 U/L (ref 0–44)
AST: 103 U/L — ABNORMAL HIGH (ref 15–41)
Albumin: 3 g/dL — ABNORMAL LOW (ref 3.5–5.0)
Alkaline Phosphatase: 53 U/L (ref 38–126)
Anion gap: >20 — ABNORMAL HIGH (ref 5–15)
BUN: 91 mg/dL — ABNORMAL HIGH (ref 6–20)
CO2: 11 mmol/L — ABNORMAL LOW (ref 22–32)
Calcium: 7.1 mg/dL — ABNORMAL LOW (ref 8.9–10.3)
Chloride: 105 mmol/L (ref 98–111)
Creatinine, Ser: 6.44 mg/dL — ABNORMAL HIGH (ref 0.44–1.00)
GFR, Estimated: 7 mL/min — ABNORMAL LOW (ref >60)
Glucose, Bld: 68 mg/dL — ABNORMAL LOW (ref 70–99)
Potassium: 6.6 mmol/L (ref 3.5–5.1)
Sodium: 139 mmol/L (ref 135–145)
Total Bilirubin: 0.5 mg/dL (ref 0.3–1.2)
Total Protein: 7.5 g/dL (ref 6.5–8.1)

## 2020-01-01 LAB — BRAIN NATRIURETIC PEPTIDE: B Natriuretic Peptide: 54 pg/mL (ref 0.0–100.0)

## 2020-01-01 LAB — LACTIC ACID, PLASMA
Lactic Acid, Venous: 5.4 mmol/L (ref 0.5–1.9)
Lactic Acid, Venous: 4.3 mmol/L (ref 0.5–1.9)

## 2020-01-01 LAB — PROTIME-INR
INR: 1.1 (ref 0.8–1.2)
Prothrombin Time: 13.4 seconds (ref 11.4–15.2)

## 2020-01-01 LAB — ETHANOL: Alcohol, Ethyl (B): <10 mg/dL (ref <10)

## 2020-01-01 LAB — POC OCCULT BLOOD, ED: Fecal Occult Bld: NEGATIVE

## 2020-01-01 LAB — RESPIRATORY PANEL BY RT PCR (FLU A&B, COVID)
Influenza A by PCR: NEGATIVE
Influenza B by PCR: NEGATIVE
SARS Coronavirus 2 by RT PCR: NEGATIVE

## 2020-01-01 LAB — TROPONIN I (HIGH SENSITIVITY)
Troponin I (High Sensitivity): <2 ng/L (ref <18)
Troponin I (High Sensitivity): 2 ng/L (ref <18)

## 2020-01-01 LAB — APTT: aPTT: 37 seconds — ABNORMAL HIGH (ref 24–36)

## 2020-01-01 MED ORDER — LACTATED RINGERS IV BOLUS (SEPSIS)
1000.0000 mL | Freq: Once | INTRAVENOUS | Status: AC
  Administered 2020-01-01: 1000 mL via INTRAVENOUS

## 2020-01-01 MED ORDER — ONDANSETRON HCL 4 MG PO TABS: 4.0000 mg | ORAL_TABLET | Freq: Four times a day (QID) | ORAL | Status: DC | PRN

## 2020-01-01 MED ORDER — FERROUS SULFATE 325 (65 FE) MG PO TABS
325.0000 mg | ORAL_TABLET | Freq: Every day | ORAL | Status: DC
  Administered 2020-01-02 – 2020-01-07 (×3): 325 mg via ORAL
  Filled 2020-01-01 (×4): qty 1

## 2020-01-01 MED ORDER — SODIUM CHLORIDE 0.9 % IV SOLN
2.0000 g | INTRAVENOUS | Status: DC
  Administered 2020-01-02 – 2020-01-04 (×2): 2 g via INTRAVENOUS
  Filled 2020-01-01 (×2): qty 2

## 2020-01-01 MED ORDER — ACETAMINOPHEN 650 MG RE SUPP: 650.0000 mg | Freq: Four times a day (QID) | RECTAL | Status: DC | PRN

## 2020-01-01 MED ORDER — MOMETASONE FURO-FORMOTEROL FUM 100-5 MCG/ACT IN AERO
2.0000 | INHALATION_SPRAY | Freq: Two times a day (BID) | RESPIRATORY_TRACT | Status: DC
  Administered 2020-01-02 – 2020-01-07 (×11): 2 via RESPIRATORY_TRACT
  Filled 2020-01-01 (×2): qty 8.8

## 2020-01-01 MED ORDER — ASPIRIN EC 81 MG PO TBEC
81.0000 mg | DELAYED_RELEASE_TABLET | Freq: Every day | ORAL | Status: DC
  Administered 2020-01-02: 81 mg via ORAL
  Filled 2020-01-01 (×2): qty 1

## 2020-01-01 MED ORDER — MEGESTROL ACETATE 40 MG PO TABS
40.0000 mg | ORAL_TABLET | Freq: Every day | ORAL | Status: DC
  Administered 2020-01-02 – 2020-01-06 (×4): 40 mg via ORAL
  Filled 2020-01-01 (×8): qty 1

## 2020-01-01 MED ORDER — CALCIUM GLUCONATE 10 % IV SOLN
1.0000 g | Freq: Once | INTRAVENOUS | Status: AC
  Administered 2020-01-01: 1 g via INTRAVENOUS
  Filled 2020-01-01: qty 10

## 2020-01-01 MED ORDER — INSULIN ASPART 100 UNIT/ML IV SOLN
5.0000 [IU] | Freq: Once | INTRAVENOUS | Status: AC
  Administered 2020-01-01: 5 [IU] via INTRAVENOUS

## 2020-01-01 MED ORDER — VITAMIN B-12 1000 MCG PO TABS
1000.0000 ug | ORAL_TABLET | Freq: Every day | ORAL | Status: DC
  Administered 2020-01-04 – 2020-01-07 (×4): 1000 ug via ORAL
  Filled 2020-01-01 (×6): qty 1

## 2020-01-01 MED ORDER — TRAZODONE HCL 50 MG PO TABS
100.0000 mg | ORAL_TABLET | Freq: Every day | ORAL | Status: DC
  Administered 2020-01-02 – 2020-01-06 (×6): 100 mg via ORAL
  Filled 2020-01-01 (×6): qty 2

## 2020-01-01 MED ORDER — SODIUM BICARBONATE 8.4 % IV SOLN
50.0000 meq | Freq: Once | INTRAVENOUS | Status: AC
  Administered 2020-01-01: 50 meq via INTRAVENOUS

## 2020-01-01 MED ORDER — BUSPIRONE HCL 5 MG PO TABS
10.0000 mg | ORAL_TABLET | Freq: Three times a day (TID) | ORAL | Status: DC
  Administered 2020-01-02 – 2020-01-07 (×14): 10 mg via ORAL
  Filled 2020-01-01 (×15): qty 2

## 2020-01-01 MED ORDER — ONDANSETRON HCL 4 MG/2ML IJ SOLN: 4.0000 mg | Freq: Four times a day (QID) | INTRAMUSCULAR | Status: DC | PRN

## 2020-01-01 MED ORDER — CLOPIDOGREL BISULFATE 75 MG PO TABS
75.0000 mg | ORAL_TABLET | Freq: Every day | ORAL | Status: DC
  Administered 2020-01-02 – 2020-01-07 (×5): 75 mg via ORAL
  Filled 2020-01-01 (×6): qty 1

## 2020-01-01 MED ORDER — ALBUTEROL SULFATE HFA 108 (90 BASE) MCG/ACT IN AERS
2.0000 | INHALATION_SPRAY | Freq: Once | RESPIRATORY_TRACT | Status: AC
  Administered 2020-01-01: 2 via RESPIRATORY_TRACT
  Filled 2020-01-01: qty 6.7

## 2020-01-01 MED ORDER — SODIUM BICARBONATE 8.4 % IV SOLN
INTRAVENOUS | Status: DC
  Filled 2020-01-01 (×2): qty 1000

## 2020-01-01 MED ORDER — VITAMIN D3 25 MCG PO TABS
1000.0000 [IU] | ORAL_TABLET | Freq: Every day | ORAL | Status: DC
  Administered 2020-01-02 – 2020-01-07 (×5): 1000 [IU] via ORAL
  Filled 2020-01-01 (×14): qty 1

## 2020-01-01 MED ORDER — HYDROCODONE-ACETAMINOPHEN 5-325 MG PO TABS: 1.0000 | ORAL_TABLET | Freq: Four times a day (QID) | ORAL | Status: DC | PRN

## 2020-01-01 MED ORDER — VANCOMYCIN HCL IN DEXTROSE 1-5 GM/200ML-% IV SOLN
1000.0000 mg | Freq: Once | INTRAVENOUS | Status: AC
  Administered 2020-01-02: 1000 mg via INTRAVENOUS
  Filled 2020-01-01: qty 200

## 2020-01-01 MED ORDER — ACETAMINOPHEN 325 MG PO TABS
650.0000 mg | ORAL_TABLET | Freq: Four times a day (QID) | ORAL | Status: DC | PRN
  Administered 2020-01-04: 650 mg via ORAL
  Filled 2020-01-01: qty 2

## 2020-01-01 MED ORDER — AMLODIPINE BESYLATE 5 MG PO TABS
10.0000 mg | ORAL_TABLET | Freq: Every morning | ORAL | Status: DC
  Administered 2020-01-02 – 2020-01-04 (×2): 10 mg via ORAL
  Filled 2020-01-01 (×3): qty 2

## 2020-01-01 MED ORDER — POTASSIUM CL IN DEXTROSE 5% 20 MEQ/L IV SOLN
20.0000 meq | INTRAVENOUS | Status: DC
Start: 2020-01-01 — End: 2020-01-01

## 2020-01-01 MED ORDER — HEPARIN SODIUM (PORCINE) 5000 UNIT/ML IJ SOLN
5000.0000 [IU] | Freq: Three times a day (TID) | INTRAMUSCULAR | Status: DC
  Administered 2020-01-02 – 2020-01-07 (×17): 5000 [IU] via SUBCUTANEOUS
  Filled 2020-01-01 (×17): qty 1

## 2020-01-01 MED ORDER — TRAMADOL HCL 50 MG PO TABS: 50.0000 mg | ORAL_TABLET | Freq: Four times a day (QID) | ORAL | Status: DC | PRN

## 2020-01-01 MED ORDER — ATORVASTATIN CALCIUM 40 MG PO TABS
40.0000 mg | ORAL_TABLET | Freq: Every day | ORAL | Status: DC
  Administered 2020-01-02 – 2020-01-06 (×6): 40 mg via ORAL
  Filled 2020-01-01 (×6): qty 1

## 2020-01-01 MED ORDER — FOLIC ACID 1 MG PO TABS
1.0000 mg | ORAL_TABLET | Freq: Every day | ORAL | Status: DC
  Administered 2020-01-02 – 2020-01-07 (×5): 1 mg via ORAL
  Filled 2020-01-01 (×6): qty 1

## 2020-01-01 MED ORDER — ALBUTEROL SULFATE HFA 108 (90 BASE) MCG/ACT IN AERS: 2.0000 | INHALATION_SPRAY | RESPIRATORY_TRACT | Status: DC | PRN

## 2020-01-01 MED ORDER — LACTATED RINGERS IV SOLN: INTRAVENOUS | Status: AC

## 2020-01-01 MED ORDER — METRONIDAZOLE IN NACL 5-0.79 MG/ML-% IV SOLN
500.0000 mg | Freq: Once | INTRAVENOUS | Status: AC
  Administered 2020-01-01: 500 mg via INTRAVENOUS
  Filled 2020-01-01: qty 100

## 2020-01-01 MED ORDER — SODIUM CHLORIDE 0.9 % IV SOLN
2.0000 g | Freq: Once | INTRAVENOUS | Status: AC
  Administered 2020-01-01: 2 g via INTRAVENOUS
  Filled 2020-01-01: qty 2

## 2020-01-01 MED ORDER — DEXTROSE 50 % IV SOLN
1.0000 | Freq: Once | INTRAVENOUS | Status: AC
  Administered 2020-01-01: 50 mL via INTRAVENOUS
  Filled 2020-01-01: qty 50

## 2020-01-01 MED ORDER — SODIUM ZIRCONIUM CYCLOSILICATE 5 G PO PACK
5.0000 g | PACK | Freq: Once | ORAL | Status: AC
  Administered 2020-01-01: 5 g via ORAL
  Filled 2020-01-01: qty 1

## 2020-01-01 MED ORDER — MOMETASONE FURO-FORMOTEROL FUM 100-5 MCG/ACT IN AERO: 2.0000 | INHALATION_SPRAY | Freq: Two times a day (BID) | RESPIRATORY_TRACT | Status: DC

## 2020-01-01 MED ORDER — VANCOMYCIN VARIABLE DOSE PER UNSTABLE RENAL FUNCTION (PHARMACIST DOSING): Status: DC

## 2020-01-01 MED ORDER — SODIUM BICARBONATE 8.4 % IV SOLN
INTRAVENOUS | Status: AC
  Filled 2020-01-01: qty 150

## 2020-01-01 NOTE — ED Notes (Signed)
Date and time results received: 01/01/20 2059   Test: Lactic Critical Value: 5.4  Name of Provider Notified:Dr Long  Orders Received? Or Actions Taken?: NA

## 2020-01-01 NOTE — Progress Notes (Signed)
Pharmacy Antibiotic Note  [name] [name] is a 54 y.o. female admitted on 01/01/2020 with sepsis.  Pharmacy has been consulted for Vanco/Cefepime dosing.  CC/HPI: Weakness, recent falls, hypotension  PMH: CKD, CVA, anxiety, arthritis, depression, DM, incontinence (urine/stool), HTN, HLD, COPD, CKD3, tobacco, TKR 2019,   ID: Afebrile, WBC 15.6, Lactic acid 5.4, Scr 6.44? Vanco 11/11>> Cefepime 11/11>>  Plan: Cefepime 2g IV x 1 then q24h for now Vanco 1g IV x 1, then f/u daily Scr trend.    Height: 5\' 8"  (172.7 cm) Weight: 61.7 kg (136 lb) IBW/kg (Calculated) : 63.9  Temp (24hrs), Avg:97.4 F (36.3 C), Min:97.4 F (36.3 C), Max:97.4 F (36.3 C)  Recent Labs  Lab 01/01/20 1830  WBC 15.6*  CREATININE 6.44*  LATICACIDVEN 5.4*    Estimated Creatinine Clearance: 9.8 mL/min (A) (by C-G formula based on SCr of 6.44 mg/dL (H)).    Allergies  Allergen Reactions  . Latex Rash    Severe itching.    [name] Seville S. 13/11/21, PharmD, BCPS Clinical Staff Pharmacist [email or web address]  Merilynn Finland 01/01/2020 9:34 PM

## 2020-01-01 NOTE — ED Provider Notes (Signed)
Emergency Department Provider Note   I have reviewed the triage vital signs and the nursing notes.   HISTORY  Chief Complaint Fatigue   HPI [name] [name] is a 54 y.o. female w/ PMH reviewed below including CKD and prior CVA arrives to the ED via EMS with generalized fatigue and hypotension. Per EMS group home staff report decreased energy and mobility starting yesterday. No known fever. No changes to medications. Patient denies any pain or SOB. She has a history of falls in the past but denies any recently. She reports feeling general weakness. Denies any vomiting or diarrhea. EMS arrived to find the patient hypotensive and weak appearing.   Level 5 caveat: AMS   Past Medical History:  Diagnosis Date  . Acute cystitis   . Anxiety   . Arthritis   . CVA (cerebral vascular accident) ([place])   . Depression   . Diabetes mellitus without complication ([place])   . Dyspnea   . Fecal incontinence   . Hypertension   . Incontinence   . Incontinence of urine   . Stroke ([place]) 2004   x 2  . Urinary incontinence     Patient Active Problem List   Diagnosis Date Noted  . AKI (acute kidney injury) ([place]) 01/01/2020  . Hyperlipidemia   . Essential hypertension   . Depression with anxiety   . Chronic obstructive pulmonary disease ([place])   . Acute renal failure superimposed on stage 3b chronic kidney disease ([place]) 10/21/2019  . Tobacco abuse 10/21/2019  . Sensory disturbance 10/21/2019  . TIA (transient ischemic attack) 10/20/2019  . Altered mental status   . Loss of consciousness ([place]) 09/08/2018  . Encephalopathy 09/08/2018  . 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 Anxiety 04/02/2017  . Acute cystitis 04/02/2017  . Urinary incontinence 01/25/2016  . Fecal incontinence 01/25/2016  . Dysthymia 01/25/2016    Past Surgical History:  Procedure Laterality Date  . COLONOSCOPY WITH PROPOFOL N/A 11/27/2018   Procedure: COLONOSCOPY WITH PROPOFOL;  Surgeon:  Toledo, Benay Pike, MD;  Location: [facility] ENDOSCOPY;  Service: Gastroenterology;  Laterality: N/A;  . ESOPHAGOGASTRODUODENOSCOPY (EGD) WITH PROPOFOL N/A 11/27/2018   Procedure: ESOPHAGOGASTRODUODENOSCOPY (EGD) WITH PROPOFOL;  Surgeon: Toledo, Benay Pike, MD;  Location: [facility] ENDOSCOPY;  Service: Gastroenterology;  Laterality: N/A;  . NO PAST SURGERIES    . TOTAL KNEE ARTHROPLASTY Right 10/03/2017   Procedure: TOTAL KNEE ARTHROPLASTY;  Surgeon: Lovell Sheehan, MD;  Location: [facility] ORS;  Service: Orthopedics;  Laterality: Right;    Allergies Latex  Family History  Problem Relation Age of Onset  . Heart disease Mother   . Heart disease Father     Social History Social History   Tobacco Use  . Smoking status: Current Every Day Smoker    Types: Cigarettes  . Smokeless tobacco: Never Used  Vaping Use  . Vaping Use: Never used  Substance Use Topics  . Alcohol use: No  . Drug use: No    Review of Systems  Constitutional: No fever/chills. Positive generalized weakness.  Eyes: No visual changes. ENT: No sore throat. Cardiovascular: Denies chest pain. Respiratory: Denies shortness of breath. Gastrointestinal: No abdominal pain.  No nausea, no vomiting.  No diarrhea.  No constipation. Genitourinary: Negative for dysuria. Musculoskeletal: Negative for back pain. Skin: Negative for rash. Neurological: Negative for headaches, focal weakness or numbness.  10-point ROS otherwise negative.  ____________________________________________   PHYSICAL EXAM:  VITAL SIGNS: ED Triage Vitals  Enc Vitals Group  BP 01/01/20 1426 (S) (!) 83/58     Pulse Rate 01/01/20 1426 81     Resp 01/01/20 1426 16     Temp 01/01/20 1426 (!) 97.4 F (36.3 C)     Temp Source 01/01/20 1426 Oral     SpO2 01/01/20 1426 92 %     Weight 01/01/20 1421 136 lb (61.7 kg)     Height 01/01/20 1421 5' 8"  (1.727 m)   Constitutional: Drowsy. Opens eyes to voice and provides brief answers to questions. Chronically ill  appearing but no acute distress.  Eyes: Conjunctivae are normal. PERRL.  Head: Atraumatic. Nose: No congestion/rhinnorhea. Mouth/Throat: Mucous membranes are dry.  Neck: No stridor.  Cardiovascular: Normal rate, regular rhythm. Good peripheral circulation. Grossly normal heart sounds.   Respiratory: Normal respiratory effort.  No retractions. Lungs CTAB. Gastrointestinal: Soft and nontender. No distention.  Musculoskeletal: No lower extremity tenderness but 2+ pitting edema in the B/L LEs. No gross deformities of extremities. Neurologic:  Normal speech and language. Following commands. No facial asymmetry.  Skin:  Skin is warm, dry and intact. No rash noted.   ____________________________________________   LABS (all labs ordered are listed, but only abnormal results are displayed)  Labs Reviewed  LACTIC ACID, PLASMA - Abnormal; Notable for the following components:      Result Value   Lactic Acid, Venous 5.4 (*)    All other components within normal limits  LACTIC ACID, PLASMA - Abnormal; Notable for the following components:   Lactic Acid, Venous 4.3 (*)    All other components within normal limits  COMPREHENSIVE METABOLIC PANEL - Abnormal; Notable for the following components:   Potassium 6.6 (*)    CO2 11 (*)    Glucose, Bld 68 (*)    BUN 91 (*)    Creatinine, Ser 6.44 (*)    Calcium 7.1 (*)    Albumin 3.0 (*)    AST 103 (*)    GFR, Estimated 7 (*)    Anion gap >20 (*)    All other components within normal limits  CBC WITH DIFFERENTIAL/PLATELET - Abnormal; Notable for the following components:   WBC 15.6 (*)    RBC 3.22 (*)    Hemoglobin 9.8 (*)    HCT 32.0 (*)    Platelets 420 (*)    Neutro Abs 14.0 (*)    All other components within normal limits  APTT - Abnormal; Notable for the following components:   aPTT 37 (*)    All other components within normal limits  URINALYSIS, ROUTINE W REFLEX MICROSCOPIC - Abnormal; Notable for the following components:    APPearance HAZY (*)    Hgb urine dipstick MODERATE (*)    Protein, ur 100 (*)    Leukocytes,Ua SMALL (*)    Bacteria, UA MANY (*)    All other components within normal limits  LACTIC ACID, PLASMA - Abnormal; Notable for the following components:   Lactic Acid, Venous 6.3 (*)    All other components within normal limits  COMPREHENSIVE METABOLIC PANEL - Abnormal; Notable for the following components:   Potassium 5.6 (*)    CO2 12 (*)    Glucose, Bld 54 (*)    BUN 86 (*)    Creatinine, Ser 6.19 (*)    Calcium 7.0 (*)    Albumin 2.6 (*)    AST 104 (*)    GFR, Estimated 8 (*)    Anion gap 21 (*)    All other components within normal limits  MAGNESIUM - Abnormal; Notable for the following components:   Magnesium 1.0 (*)    All other components within normal limits  BLOOD GAS, VENOUS - Abnormal; Notable for the following components:   pH, Ven 7.164 (*)    pCO2, Ven 35.6 (*)    Bicarbonate 12.3 (*)    Acid-base deficit 14.6 (*)    All other components within normal limits  CBG MONITORING, ED - Abnormal; Notable for the following components:   Glucose-Capillary 45 (*)    All other components within normal limits  CBG MONITORING, ED - Abnormal; Notable for the following components:   Glucose-Capillary 140 (*)    All other components within normal limits  CBG MONITORING, ED - Abnormal; Notable for the following components:   Glucose-Capillary 123 (*)    All other components within normal limits  CBG MONITORING, ED - Abnormal; Notable for the following components:   Glucose-Capillary 62 (*)    All other components within normal limits  CBG MONITORING, ED - Abnormal; Notable for the following components:   Glucose-Capillary 68 (*)    All other components within normal limits  CULTURE, BLOOD (ROUTINE X 2)  CULTURE, BLOOD (ROUTINE X 2)  RESPIRATORY PANEL BY RT PCR (FLU A&B, COVID)  URINE CULTURE  PROTIME-INR  BRAIN NATRIURETIC PEPTIDE  RAPID URINE DRUG SCREEN, HOSP PERFORMED   ETHANOL  PROTIME-INR  PROCALCITONIN  TSH  CORTISOL-AM, BLOOD  POC OCCULT BLOOD, ED  TROPONIN I (HIGH SENSITIVITY)  TROPONIN I (HIGH SENSITIVITY)   ____________________________________________  EKG   EKG Interpretation  Date/Time:  Thursday January 01 2020 14:56:52 EST Ventricular Rate:  80 PR Interval:    QRS Duration: 94 QT Interval:  429 QTC Calculation: 495 R Axis:   79 Text Interpretation: Sinus rhythm Borderline low voltage, extremity leads ST elev, probable normal early repol pattern Borderline prolonged QT interval No STEMI Confirmed by Nanda Quinton [phone]) on 01/01/2020 5:28:40 PM       ____________________________________________  RADIOLOGY  CT Head Wo Contrast  Result Date: 01/01/2020 CLINICAL DATA:  Altered level of consciousness, history of falls EXAM: CT HEAD WITHOUT CONTRAST TECHNIQUE: Contiguous axial images were obtained from the base of the skull through the vertex without intravenous contrast. COMPARISON:  10/20/2019 FINDINGS: Brain: Hypodensities of the bilateral basal ganglia and periventricular white matter are stable, consistent with chronic small vessel ischemic changes. No signs of acute infarct or hemorrhage. The lateral ventricles and remaining midline structures are unremarkable. No acute extra-axial fluid collections. No mass effect. Vascular: No hyperdense vessel or unexpected calcification. Skull: Normal. Negative for fracture or focal lesion. Sinuses/Orbits: No acute finding. Other: None. IMPRESSION: 1. Chronic small vessel ischemic changes. No acute intracranial process. Electronically Signed   By: Randa Ngo M.D.   On: 01/01/2020 16:41   DG Chest Port 1 View  Result Date: 01/01/2020 CLINICAL DATA:  Sepsis, weakness EXAM: PORTABLE CHEST 1 VIEW COMPARISON:  09/07/2018 FINDINGS: The heart size and mediastinal contours are within normal limits. Both lungs are clear. The visualized skeletal structures are unremarkable. IMPRESSION: No active  disease. Electronically Signed   By: Randa Ngo M.D.   On: 01/01/2020 16:17    ____________________________________________   PROCEDURES  Procedure(s) performed:   Procedures  CRITICAL CARE Performed by: Margette Fast Total critical care time: 75 minutes Critical care time was exclusive of separately billable procedures and treating other patients. Critical care was necessary to treat or prevent imminent or life-threatening deterioration. Critical care was time spent personally by me on  the following activities: development of treatment plan with patient and/or surrogate as well as nursing, discussions with consultants, evaluation of patient's response to treatment, examination of patient, obtaining history from patient or surrogate, ordering and performing treatments and interventions, ordering and review of laboratory studies, ordering and review of radiographic studies, pulse oximetry and re-evaluation of patient's condition.  Nanda Quinton, MD Emergency Medicine  ____________________________________________   INITIAL IMPRESSION / ASSESSMENT AND PLAN / ED COURSE  Pertinent labs & imaging results that were available during my care of the patient were reviewed by me and considered in my medical decision making (see chart for details).   Patient arrives to the ED with fatigue and hypotension. Suspect hypovolemia 2/2 dehydration but considering sepsis on my differential as well as cardiogenic syncope. History is limited. Patient managing her airway at this time. Plan for 34m/kg fluid bolus. No other SIRS criteria met. No respiratory distress. RR documented as 32 shortly after arrival but < 20 on my exam at bedside. CT heead ordered with some somnolence but no outward sign of trauma. Question renal failure to explain symptoms.   09:30 PM  Blood work significantly delayed due to blood collection issues and lab processing issues.  Patient's lab work coming back with significantly  elevated lactate.  I started antibiotics in addition to the IV fluids.  She has been fluid responsive here and remains awake and alert.  Her chemistry shows significant hyperkalemia as well as acute renal failure.  We will reach out to nephrology and start on insulin/D50 along with bicarb and albuterol with lokelma.   Discussed patient's case with [facility] to request admission. Patient and family (if present) updated with plan. [facility]  I reviewed all nursing notes, vitals, pertinent old records, EKGs, labs, imaging (as available).  ____________________________________________  FINAL CLINICAL IMPRESSION(S) / ED DIAGNOSES  Final diagnoses:  Acute renal failure, unspecified acute renal failure type ([place]  Hyperkalemia  Sepsis with acute renal failure without septic shock, due to unspecified organism, unspecified acute renal failure type ([place]     MEDICATIONS GIVEN DURING THIS VISIT:  Medications  lactated ringers infusion ( Intravenous Stopped 01/01/20 2313)  vancomycin variable dose per unstable renal function (pharmacist dosing) (has no administration in time range)  ceFEPIme (MAXIPIME) 2 g in sodium chloride 0.9 % 100 mL IVPB (has no administration in time range)  HYDROcodone-acetaminophen (NORCO/VICODIN) 5-325 MG per tablet 1-2 tablet (has no administration in time range)  Vitamin D3 (Vitamin D) tablet 1,000 Units (has no administration in time range)  aspirin EC tablet 81 mg (has no administration in time range)  traMADol (ULTRAM) tablet 50 mg (has no administration in time range)  megestrol (MEGACE) tablet 40 mg (has no administration in time range)  amLODipine (NORVASC) tablet 10 mg (has no administration in time range)  atorvastatin (LIPITOR) tablet 40 mg (40 mg Oral Given 01/02/20 0006)  busPIRone (BUSPAR) tablet 10 mg (10 mg Oral Given 01/02/20 0006)  traZODone (DESYREL) tablet 100 mg (100 mg Oral Given 01/02/20 0004)  clopidogrel (PLAVIX) tablet 75 mg (has  no administration in time range)  ferrous sulfate tablet 325 mg (has no administration in time range)  folic acid (FOLVITE) tablet 1 mg (has no administration in time range)  vitamin B-12 (CYANOCOBALAMIN) tablet 1,000 mcg (has no administration in time range)  albuterol (VENTOLIN HFA) 108 (90 Base) MCG/ACT inhaler 2 puff (has no administration in time range)  heparin injection 5,000 Units (5,000 Units Subcutaneous Given 01/02/20 0004)  acetaminophen (  TYLENOL) tablet 650 mg (has no administration in time range)    Or  acetaminophen (TYLENOL) suppository 650 mg (has no administration in time range)  ondansetron (ZOFRAN) tablet 4 mg (has no administration in time range)    Or  ondansetron (ZOFRAN) injection 4 mg (has no administration in time range)  mometasone-formoterol (DULERA) 100-5 MCG/ACT inhaler 2 puff (2 puffs Inhalation Given 01/02/20 0909)  insulin aspart (novoLOG) injection 0-9 Units (0 Units Subcutaneous Not Given 01/02/20 0854)  0.9 %  sodium chloride infusion ( Intravenous Restarted 01/02/20 0746)  dextrose 5 % solution (0 mLs Intravenous Hold 01/02/20 0259)  sodium zirconium cyclosilicate (LOKELMA) packet 5 g (has no administration in time range)  lactated ringers bolus 1,000 mL (0 mLs Intravenous Stopped 01/01/20 1937)    And  lactated ringers bolus 1,000 mL (0 mLs Intravenous Stopped 01/01/20 1906)  ceFEPIme (MAXIPIME) 2 g in sodium chloride 0.9 % 100 mL IVPB (0 g Intravenous Stopped 01/01/20 2312)  metroNIDAZOLE (FLAGYL) IVPB 500 mg (0 mg Intravenous Stopped 01/02/20 0012)  vancomycin (VANCOCIN) IVPB 1000 mg/200 mL premix (0 mg Intravenous Stopped 01/02/20 0215)  calcium gluconate inj 10% (1 g) URGENT USE ONLY! (1 g Intravenous Given 01/01/20 2156)  dextrose 50 % solution 50 mL (50 mLs Intravenous Given 01/02/20 0234)  insulin aspart (novoLOG) injection 5 Units (5 Units Intravenous Given 01/01/20 2201)  sodium bicarbonate injection 50 mEq (50 mEq Intravenous Given 01/01/20  2200)  sodium zirconium cyclosilicate (LOKELMA) packet 5 g (5 g Oral Given 01/01/20 2206)  albuterol (VENTOLIN HFA) 108 (90 Base) MCG/ACT inhaler 2 puff (2 puffs Inhalation Given 01/01/20 2157)  dextrose 50 % solution 50 mL (50 mLs Intravenous Given 01/02/20 0300)  sodium chloride 0.9 % bolus 500 mL (500 mLs Intravenous New Bag/Given 01/02/20 0636)     Note:  This document was prepared using Dragon voice recognition software and may include unintentional dictation errors.  Nanda Quinton, MD, [facility] Emergency Medicine    Jevaun Strick, [place], MD 01/02/20 [phone]

## 2020-01-01 NOTE — ED Notes (Signed)
Blood cultures drawn x 2 

## 2020-01-01 NOTE — H&P (Signed)
TRH H&P    Patient Demographics:    [name] [name], is a 54 y.o. female  MRN: [id number]  DOB - 12-18-1965  Admit Date - 01/01/2020  Referring MD/NP/PA: Long  Outpatient Primary MD for the patient is Patient, No Pcp Per  Patient coming from: Group home  Chief complaint- generalized weakness   HPI:    [name] [name]  is a 54 y.o. female, with history of stroke, urinary incontinence, fecal incontinence, diabetes mellitus type 2, depression/anxiety and more presents to the ED with a chief complaint of generalized weakness.  Unfortunately patient is not able to provide any history reporting that she does not know why she is here and that she has not been sick.  Patient is not in any pain at the time of my exam.  She is alert and oriented to self and place.  Chart review reveals that group home staff noted decreased energy and mobility starting 12/31/2019.  No fever was noted, no changes to medications.  They had not noticed any change in breathing, and patient denies shortness of breath at the time of the exam.  Patient also denies feeling nauseous at the time of exam.  When she arrived in the ED she was found to have generalized weakness, fatigue, and hypotension.  Sepsis work-up was initiated.  Patient had a white blood cell count of 15.6, heart rate of 101, and hypotension down to 87/60.  She was given the full 30 mL/kg bolus.  UA was done that is borderline.  It shows many bacteria, 0-5 white blood cells, and small leukocytes.  Urine culture is pending.  UDS was negative.  Chest x-ray showed no active disease.  CT head showed chronic my small vessel ischemic changes.  On chemistry panel patient was found to have metabolic acidosis with a bicarb of 11, hyperkalemia at 6.6, acute renal failure at 91 BUN and 6.44 creatinine.  Gap was greater than 20.  BNP 54, troponin II.  Lactic acid 5.4-4.3.  Blood cultures were drawn.   FOBT negative.  Patient was started on cefepime, bank, albuterol, given a gram of calcium, given sodium bicarb 1 amp, started on Lokelma.  ED provider called nephrology who urged shifting measures for potassium and Lokelma, but does not think that emergent dialysis is necessary.    Review of systems:    In addition to the HPI above,  Review of Systems  Unable to perform ROS: Mental acuity        Past History of the following :    Past Medical History:  Diagnosis Date  . Acute cystitis   . Anxiety   . Arthritis   . CVA (cerebral vascular accident) (HCC)   . Depression   . Diabetes mellitus without complication (HCC)   . Dyspnea   . Fecal incontinence   . Hypertension   . Incontinence   . Incontinence of urine   . Stroke (HCC) 2004   x 2  . Urinary incontinence       Past Surgical History:  Procedure Laterality Date  .  COLONOSCOPY WITH PROPOFOL N/A 11/27/2018   Procedure: COLONOSCOPY WITH PROPOFOL;  Surgeon: Toledo, Boykin Nearing, MD;  Location: [facility];  Service: Gastroenterology;  Laterality: N/A;  . ESOPHAGOGASTRODUODENOSCOPY (EGD) WITH PROPOFOL N/A 11/27/2018   Procedure: ESOPHAGOGASTRODUODENOSCOPY (EGD) WITH PROPOFOL;  Surgeon: Toledo, Boykin Nearing, MD;  Location: [facility] ENDOSCOPY;  Service: Gastroenterology;  Laterality: N/A;  . NO PAST SURGERIES    . TOTAL KNEE ARTHROPLASTY Right 10/03/2017   Procedure: TOTAL KNEE ARTHROPLASTY;  Surgeon: Lyndle Herrlich, MD;  Location: [facility] ORS;  Service: Orthopedics;  Laterality: Right;      Social History:      Social History   Tobacco Use  . Smoking status: Current Every Day Smoker    Types: Cigarettes  . Smokeless tobacco: Never Used  Substance Use Topics  . Alcohol use: No       Family History :     Family History  Problem Relation Age of Onset  . Heart disease Mother   . Heart disease Father       Home Medications:   Prior to Admission medications   Medication Sig Start Date End Date Taking? Authorizing  Provider  albuterol (PROVENTIL HFA;VENTOLIN HFA) 108 (90 Base) MCG/ACT inhaler Inhale 2 puffs into the lungs every 4 (four) hours as needed for wheezing or shortness of breath.   Yes [provider]  amLODipine (NORVASC) 10 MG tablet Take 10 mg by mouth every morning.    Yes [provider]  aspirin EC 81 MG tablet Take 1 tablet (81 mg total) by mouth daily. Swallow whole. 10/22/19 10/21/20 Yes Vassie Loll, MD  atorvastatin (LIPITOR) 40 MG tablet Take 40 mg by mouth daily at 8 pm. (2000)   Yes [provider]  busPIRone (BUSPAR) 10 MG tablet Take 10 mg by mouth 3 (three) times daily.   Yes [provider]  cholecalciferol (VITAMIN D3) 25 MCG (1000 UT) tablet Take 1,000 Units by mouth every morning.   Yes [provider]  clopidogrel (PLAVIX) 75 MG tablet Take 1 tablet (75 mg total) by mouth daily with breakfast. 10/23/19  Yes Vassie Loll, MD  Dexlansoprazole 30 MG capsule Take 30 mg by mouth daily.   Yes [provider]  diclofenac sodium (VOLTAREN) 1 % GEL Apply 2 g topically 4 (four) times daily as needed. Patient taking differently: Apply 2 g topically in the morning and at bedtime.  09/13/18  Yes Briant Cedar, MD  ferrous sulfate 325 (65 FE) MG tablet Take 1 tablet (325 mg total) by mouth 2 (two) times daily with a meal. Patient taking differently: Take 325 mg by mouth daily with breakfast.  09/13/18 01/01/20 Yes Briant Cedar, MD  Fluticasone-Salmeterol (ADVAIR) 100-50 MCG/DOSE AEPB Inhale 1 puff into the lungs 2 (two) times daily.   Yes [provider]  folic acid (FOLVITE) 1 MG tablet Take 1 mg by mouth daily.   Yes [provider]  furosemide (LASIX) 20 MG tablet Take 1 tablet (20 mg total) by mouth 2 (two) times daily. (0800) Patient taking differently: Take 30 mg by mouth 2 (two) times daily. (0800) 10/22/19  Yes Vassie Loll, MD  lisinopril (PRINIVIL,ZESTRIL) 20 MG tablet Take 20 mg by mouth daily. (0800)    Yes [provider]  megestrol (MEGACE) 40 MG tablet Take 40 mg by mouth daily.   Yes [provider]  metFORMIN (GLUCOPHAGE-XR) 500 MG 24 hr tablet Take 1,000 mg by mouth 2 (two) times daily. (0800 & 2000) 09/05/17  Yes  [provider]  traMADol (ULTRAM) 50 MG tablet Take 50 mg by mouth 4 (four) times daily.  09/30/19  Yes [provider]  traZODone (DESYREL) 100 MG tablet Take 100 mg by mouth at bedtime.   Yes [provider]  vitamin B-12 (CYANOCOBALAMIN) 1000 MCG tablet Take 1,000 mcg by mouth daily.   Yes [provider]  cholecalciferol (VITAMIN D) 25 MCG (1000 UNIT) tablet Take 1 tablet (1,000 Units total) by mouth daily. (0800) Patient not taking: Reported on 01/01/2020 10/22/19   Vassie Loll, MD  docusate sodium (COLACE) 100 MG capsule Take 1 capsule (100 mg total) by mouth 2 (two) times daily. Patient not taking: Reported on 01/01/2020 10/22/19   Vassie Loll, MD  HYDROcodone-acetaminophen (NORCO/VICODIN) 5-325 MG tablet Take 1-2 tablets by mouth every 6 (six) hours as needed for severe pain (pain score 4-6). Patient not taking: Reported on 01/01/2020 10/22/19   Vassie Loll, MD  nicotine (NICODERM CQ - DOSED IN MG/24 HOURS) 21 mg/24hr patch Place 1 patch (21 mg total) onto the skin daily. Patient not taking: Reported on 01/01/2020 10/23/19   Vassie Loll, MD  pantoprazole (PROTONIX) 40 MG tablet Take 1 tablet (40 mg total) by mouth daily. 09/14/18 10/20/19  Briant Cedar, MD     Allergies:     Allergies  Allergen Reactions  . Latex Rash    Severe itching.     Physical Exam:   Vitals  Blood pressure (!) 76/52, pulse 90, temperature (!) 97.4 F (36.3 C), temperature source Oral, resp. rate 15, height 5\' 8"  (1.727 m), weight 61.7 kg, SpO2 100 %.  1.  General: Patient resting in supine position in bed no acute distress  2. Psychiatric: Alert and oriented to self and place, not oriented to time Cooperative with  exam Mood and behavior normal for situation  3. Neurologic: Normal speech and language, follows commands, no facial asymmetry. Difficulty with minimal movement of lower extremities against gravity, symmetric  4. HEENMT:  Head is atraumatic, normocephalic, pupils are reactive to light, neck is supple, trachea is midline, mucous membranes are mildly dry  5. Respiratory : Lungs are clear to auscultation bilaterally  6. Cardiovascular : Heart rate is normal, rhythm is regular, peripheral pulses palpated  7. Gastrointestinal:  Abdomen is soft, nondistended, nontender to palpation  8. Skin:  Venous stasis changes of the lower extremities, no other acute lesion or rash on limited exam  9.Musculoskeletal:  2+ pitting edema lower extremities bilaterally, no acute deformity, no calf tenderness    Data Review:    CBC Recent Labs  Lab 01/01/20 1830  WBC 15.6*  HGB 9.8*  HCT 32.0*  PLT 420*  MCV 99.4  MCH 30.4  MCHC 30.6  RDW 12.9  LYMPHSABS 0.9  MONOABS 0.6  EOSABS 0.0  BASOSABS 0.0   ------------------------------------------------------------------------------------------------------------------  Results for orders placed or performed during the hospital encounter of 01/01/20 (from the past 48 hour(s))  Respiratory Panel by RT PCR (Flu A&B, Covid) - Nasopharyngeal Swab     Status: None   Collection Time: 01/01/20  3:08 PM   Specimen: Nasopharyngeal Swab  Result Value Ref Range   SARS Coronavirus 2 by RT PCR NEGATIVE NEGATIVE    Comment: (NOTE) SARS-CoV-2 target nucleic acids are NOT DETECTED.  The SARS-CoV-2 RNA is generally detectable in upper respiratoy specimens during the acute phase of infection. The lowest concentration of SARS-CoV-2 viral copies this assay can detect is 131 copies/mL. A negative result does not preclude SARS-Cov-2 infection  and should not be used as the sole basis for treatment or other patient management decisions. A negative result may  occur with  improper specimen collection/handling, submission of specimen other than nasopharyngeal swab, presence of viral mutation(s) within the areas targeted by this assay, and inadequate number of viral copies (<131 copies/mL). A negative result must be combined with clinical observations, patient history, and epidemiological information. The expected result is Negative.  Fact Sheet for Patients:  https://www.moore.com/  Fact Sheet for Healthcare Providers:  https://www.young.biz/  This test is no t yet approved or cleared by the [place] FDA and  has been authorized for detection and/or diagnosis of SARS-CoV-2 by FDA under an Emergency Use Authorization (EUA). This EUA will remain  in effect (meaning this test can be used) for the duration of the COVID-19 declaration under Section 564(b)(1) of the Act, 21 U.S.C. section 360bbb-3(b)(1), unless the authorization is terminated or revoked sooner.     Influenza A by PCR NEGATIVE NEGATIVE   Influenza B by PCR NEGATIVE NEGATIVE    Comment: (NOTE) The Xpert Xpress SARS-CoV-2/FLU/RSV assay is intended as an aid in  the diagnosis of influenza from Nasopharyngeal swab specimens and  should not be used as a sole basis for treatment. Nasal washings and  aspirates are unacceptable for Xpert Xpress SARS-CoV-2/FLU/RSV  testing.  Fact Sheet for Patients: https://www.moore.com/  Fact Sheet for Healthcare Providers: https://www.young.biz/  This test is not yet approved or cleared by the [place] FDA and  has been authorized for detection and/or diagnosis of SARS-CoV-2 by  FDA under an Emergency Use Authorization (EUA). This EUA will remain  in effect (meaning this test can be used) for the duration of the  Covid-19 declaration under Section 564(b)(1) of the Act, 21  U.S.C. section 360bbb-3(b)(1), unless the authorization is  terminated or  revoked. Performed at [facility], [address], [place], [place] [address]   Lactic acid, plasma     Status: Abnormal   Collection Time: 01/01/20  6:30 PM  Result Value Ref Range   Lactic Acid, Venous 5.4 (HH) 0.5 - 1.9 mmol/L    Comment: CRITICAL RESULT CALLED TO, READ BACK BY AND VERIFIED WITH: THOMPSON,R ON 01/01/20 AT 2055 BY LOY,C Performed at [facility], [address] [place], [place] [address]   Comprehensive metabolic panel     Status: Abnormal   Collection Time: 01/01/20  6:30 PM  Result Value Ref Range   Sodium 139 135 - 145 mmol/L   Potassium 6.6 (HH) 3.5 - 5.1 mmol/L    Comment: CRITICAL RESULT CALLED TO, READ BACK BY AND VERIFIED WITH: BLEVINS,Z ON 01/01/20 AT 2125 BY LOY,C    Chloride 105 98 - 111 mmol/L   CO2 11 (L) 22 - 32 mmol/L   Glucose, Bld 68 (L) 70 - 99 mg/dL    Comment: Glucose reference range applies only to samples taken after fasting for at least 8 hours.   BUN 91 (H) 6 - 20 mg/dL   Creatinine, Ser 3.08 (H) 0.44 - 1.00 mg/dL   Calcium 7.1 (L) 8.9 - 10.3 mg/dL   Total Protein 7.5 6.5 - 8.1 g/dL   Albumin 3.0 (L) 3.5 - 5.0 g/dL   AST 657 (H) 15 - 41 U/L   ALT 40 0 - 44 U/L   Alkaline Phosphatase 53 38 - 126 U/L   Total Bilirubin 0.5 0.3 - 1.2 mg/dL   GFR, Estimated 7 (L) >60 mL/min    Comment: (NOTE) Calculated using the CKD-EPI  Creatinine Equation (2021)    Anion gap >20 (H) 5 - 15    Comment: Performed at [facility], [address], [place], [place] [address]  CBC WITH DIFFERENTIAL     Status: Abnormal   Collection Time: 01/01/20  6:30 PM  Result Value Ref Range   WBC 15.6 (H) 4.0 - 10.5 K/uL   RBC 3.22 (L) 3.87 - 5.11 MIL/uL   Hemoglobin 9.8 (L) 12.0 - 15.0 g/dL   HCT 52.8 (L) 36 - 46 %   MCV 99.4 80.0 - 100.0 fL   MCH 30.4 26.0 - 34.0 pg   MCHC 30.6 30.0 - 36.0 g/dL   RDW 41.3 24.4 - 01.0 %   Platelets 420 (H) 150 - 400 K/uL   nRBC 0.0 0.0 - 0.2 %   Neutrophils Relative % 90 %   Neutro Abs 14.0 (H) 1.7 - 7.7 K/uL   Lymphocytes  Relative 6 %   Lymphs Abs 0.9 0.7 - 4.0 K/uL   Monocytes Relative 4 %   Monocytes Absolute 0.6 0.1 - 1.0 K/uL   Eosinophils Relative 0 %   Eosinophils Absolute 0.0 0.0 - 0.5 K/uL   Basophils Relative 0 %   Basophils Absolute 0.0 0.0 - 0.1 K/uL   Immature Granulocytes 0 %   Abs Immature Granulocytes 0.07 0.00 - 0.07 K/uL    Comment: Performed at [facility], [address] [place], [place] [address]  Protime-INR     Status: None   Collection Time: 01/01/20  6:30 PM  Result Value Ref Range   Prothrombin Time 13.4 11.4 - 15.2 seconds   INR 1.1 0.8 - 1.2    Comment: (NOTE) INR goal varies based on device and disease states. Performed at [facility], [address] [place], [place] [address]   APTT     Status: Abnormal   Collection Time: 01/01/20  6:30 PM  Result Value Ref Range   aPTT 37 (H) 24 - 36 seconds    Comment:        IF BASELINE aPTT IS ELEVATED, SUGGEST PATIENT RISK ASSESSMENT BE USED TO DETERMINE APPROPRIATE ANTICOAGULANT THERAPY. Performed at [facility], [address], [place], [place] [address]   Blood Culture (routine x 2)     Status: None (Preliminary result)   Collection Time: 01/01/20  6:30 PM   Specimen: Left Antecubital; Blood  Result Value Ref Range   Specimen Description LEFT ANTECUBITAL    Special Requests      BOTTLES DRAWN AEROBIC AND ANAEROBIC Blood Culture adequate volume Performed at [facility] [place] [facility], [address], [place], [place] [address]    Culture PENDING    Report Status PENDING   Blood Culture (routine x 2)     Status: None (Preliminary result)   Collection Time: 01/01/20  6:30 PM   Specimen: BLOOD LEFT HAND  Result Value Ref Range   Specimen Description BLOOD LEFT HAND    Special Requests      BOTTLES DRAWN AEROBIC AND ANAEROBIC Blood Culture adequate volume Performed at [facility], [address] [place], [place] [address]    Culture PENDING    Report Status PENDING   Troponin I (High Sensitivity)     Status: None   Collection  Time: 01/01/20  6:30 PM  Result Value Ref Range   Troponin I (High Sensitivity) <2 <18 ng/L    Comment: (NOTE) Elevated high sensitivity troponin I (hsTnI) values and significant  changes across serial measurements may suggest ACS but many other  chronic and acute conditions are known to elevate hsTnI results.  Refer to the Links section for chest pain algorithms and additional  guidance. Performed at [facility], [address] [place], [place] [address]   Brain natriuretic peptide     Status: None   Collection Time: 01/01/20  6:30 PM  Result Value Ref Range   B Natriuretic Peptide 54.0 0.0 - 100.0 pg/mL    Comment: Performed at [facility], [address], [place], [place] [address]  Ethanol     Status: None   Collection Time: 01/01/20  6:30 PM  Result Value Ref Range   Alcohol, Ethyl (B) <10 <10 mg/dL    Comment: (NOTE) Lowest detectable limit for serum alcohol is 10 mg/dL.  For medical purposes only. Performed at [facility], [address], [place], [place] [address]   Urinalysis, Routine w reflex microscopic Urine, Catheterized     Status: Abnormal   Collection Time: 01/01/20  6:55 PM  Result Value Ref Range   Color, Urine YELLOW YELLOW   APPearance HAZY (A) CLEAR   Specific Gravity, Urine 1.014 1.005 - 1.030   pH 5.0 5.0 - 8.0   Glucose, UA NEGATIVE NEGATIVE mg/dL   Hgb urine dipstick MODERATE (A) NEGATIVE   Bilirubin Urine NEGATIVE NEGATIVE   Ketones, ur NEGATIVE NEGATIVE mg/dL   Protein, ur 254 (A) NEGATIVE mg/dL   Nitrite NEGATIVE NEGATIVE   Leukocytes,Ua SMALL (A) NEGATIVE   RBC / HPF 0-5 0 - 5 RBC/hpf   WBC, UA 0-5 0 - 5 WBC/hpf   Bacteria, UA MANY (A) NONE SEEN   Squamous Epithelial / LPF 0-5 0 - 5    Comment: Performed at [facility], [address], [place], [place] [address]  POC occult blood, ED Provider will collect     Status: None   Collection Time: 01/01/20  6:55 PM  Result Value Ref Range   Fecal Occult Bld NEGATIVE NEGATIVE  Urine rapid drug  screen (hosp performed)     Status: None   Collection Time: 01/01/20  6:56 PM  Result Value Ref Range   Opiates NONE DETECTED NONE DETECTED   Cocaine NONE DETECTED NONE DETECTED   Benzodiazepines NONE DETECTED NONE DETECTED   Amphetamines NONE DETECTED NONE DETECTED   Tetrahydrocannabinol NONE DETECTED NONE DETECTED   Barbiturates NONE DETECTED NONE DETECTED    Comment: (NOTE) DRUG SCREEN FOR MEDICAL PURPOSES ONLY.  IF CONFIRMATION IS NEEDED FOR ANY PURPOSE, NOTIFY LAB WITHIN 5 DAYS.  LOWEST DETECTABLE LIMITS FOR URINE DRUG SCREEN Drug Class                     Cutoff (ng/mL) Amphetamine and metabolites    1000 Barbiturate and metabolites    200 Benzodiazepine                 200 Tricyclics and metabolites     300 Opiates and metabolites        300 Cocaine and metabolites        300 THC                            50 Performed at [facility], [address], [place], [place] [address]   Lactic acid, plasma     Status: Abnormal   Collection Time: 01/01/20  9:20 PM  Result Value Ref Range   Lactic Acid, Venous 4.3 (HH) 0.5 - 1.9 mmol/L    Comment: CRITICAL VALUE  NOTED.  VALUE IS CONSISTENT WITH PREVIOUSLY REPORTED AND CALLED VALUE. Performed at [facility], [address] [place], [place] [address]   Troponin I (High Sensitivity)     Status: None   Collection Time: 01/01/20  9:23 PM  Result Value Ref Range   Troponin I (High Sensitivity) 2 <18 ng/L    Comment: (NOTE) Elevated high sensitivity troponin I (hsTnI) values and significant  changes across serial measurements may suggest ACS but many other  chronic and acute conditions are known to elevate hsTnI results.  Refer to the "Links" section for chest pain algorithms and additional  guidance. Performed at [facility], [address] [place], [place] [address]     Chemistries  Recent Labs  Lab 01/01/20 1830  NA 139  K 6.6*  CL 105  CO2 11*  GLUCOSE 68*  BUN 91*  CREATININE 6.44*  CALCIUM 7.1*  AST 103*   ALT 40  ALKPHOS 53  BILITOT 0.5   ------------------------------------------------------------------------------------------------------------------  ------------------------------------------------------------------------------------------------------------------ GFR: Estimated Creatinine Clearance: 9.8 mL/min (A) (by C-G formula based on SCr of 6.44 mg/dL (H)). Liver Function Tests: Recent Labs  Lab 01/01/20 1830  AST 103*  ALT 40  ALKPHOS 53  BILITOT 0.5  PROT 7.5  ALBUMIN 3.0*   No results for input(s): LIPASE, AMYLASE in the last 168 hours. No results for input(s): AMMONIA in the last 168 hours. Coagulation Profile: Recent Labs  Lab 01/01/20 1830  INR 1.1   Cardiac Enzymes: No results for input(s): CKTOTAL, CKMB, CKMBINDEX, TROPONINI in the last 168 hours. BNP (last 3 results) No results for input(s): PROBNP in the last 8760 hours. HbA1C: No results for input(s): HGBA1C in the last 72 hours. CBG: No results for input(s): GLUCAP in the last 168 hours. Lipid Profile: No results for input(s): CHOL, HDL, LDLCALC, TRIG, CHOLHDL, LDLDIRECT in the last 72 hours. Thyroid Function Tests: No results for input(s): TSH, T4TOTAL, FREET4, T3FREE, THYROIDAB in the last 72 hours. Anemia Panel: No results for input(s): VITAMINB12, FOLATE, FERRITIN, TIBC, IRON, RETICCTPCT in the last 72 hours.  --------------------------------------------------------------------------------------------------------------- Urine analysis:    Component Value Date/Time   COLORURINE YELLOW 01/01/2020 1855   APPEARANCEUR HAZY (A) 01/01/2020 1855   LABSPEC 1.014 01/01/2020 1855   PHURINE 5.0 01/01/2020 1855   GLUCOSEU NEGATIVE 01/01/2020 1855   HGBUR MODERATE (A) 01/01/2020 1855   BILIRUBINUR NEGATIVE 01/01/2020 1855   KETONESUR NEGATIVE 01/01/2020 1855   PROTEINUR 100 (A) 01/01/2020 1855   NITRITE NEGATIVE 01/01/2020 1855   LEUKOCYTESUR SMALL (A) 01/01/2020 1855      Imaging Results:     CT Head Wo Contrast  Result Date: 01/01/2020 CLINICAL DATA:  Altered level of consciousness, history of falls EXAM: CT HEAD WITHOUT CONTRAST TECHNIQUE: Contiguous axial images were obtained from the base of the skull through the vertex without intravenous contrast. COMPARISON:  10/20/2019 FINDINGS: Brain: Hypodensities of the bilateral basal ganglia and periventricular white matter are stable, consistent with chronic small vessel ischemic changes. No signs of acute infarct or hemorrhage. The lateral ventricles and remaining midline structures are unremarkable. No acute extra-axial fluid collections. No mass effect. Vascular: No hyperdense vessel or unexpected calcification. Skull: Normal. Negative for fracture or focal lesion. Sinuses/Orbits: No acute finding. Other: None. IMPRESSION: 1. Chronic small vessel ischemic changes. No acute intracranial process. Electronically Signed   By: Sharlet Salina M.D.   On: 01/01/2020 16:41   DG Chest Port 1 View  Result Date: 01/01/2020 CLINICAL DATA:  Sepsis, weakness EXAM: PORTABLE CHEST 1 VIEW  COMPARISON:  09/07/2018 FINDINGS: The heart size and mediastinal contours are within normal limits. Both lungs are clear. The visualized skeletal structures are unremarkable. IMPRESSION: No active disease. Electronically Signed   By: Sharlet SalinaMichael  Brown M.D.   On: 01/01/2020 16:17       Assessment & Plan:    Active Problems:   AKI (acute kidney injury) (HCC)   1. Acute renal failure 1. Creatinine at baseline is 1.12, today is 6.44 2. BUN also elevated today at 91 3. Likely prerenal in the setting of hypotension 4. Fluid bolus 30 mL/kg given in ED 5. Continue fluids 150 6. Nephrology consulted who advised no urgent dialysis necessary 7. Hold nephrotoxic agents when possible including Lasix, lisinopril, PPI 8. Trend in a.m. 2. Rule out sepsis secondary to unknown source 1. SIRS criteria with heart rate 101 blood pressure 87/60 and white blood cell count 15.6 2. UA  borderline urine culture pending 3. Blood cultures pending 4. Chest x-ray no active disease 5. Lactic acidosis elevated at 5.4 and then 4.3 after fluid bolus 6. Patient started on cefepime and Vanco 7. Procalcitonin pending 3. Lactic acidosis 1. Initial lactic acid 5.4, improved to 4.3 after fluid bolus 2. Continue IV fluid hydration 3. Continue to trend 4. Likely secondary to hypotension 5. Continue to monitor 4. Metabolic acidosis 1. VBG pending 2. Bicarb 11 3. Amp of bicarb given in ED 4. Renal etiology most likely 5. Continue IV hydration 6. Trend in the a.m. 5. Hyperkalemia 1. Potassium 6.6 2. Nephrology recommended shifting measures, Lokelma 3. Patient was on dextrose 5% with 20 mEq of potassium chloride -change to normal saline 4. Trend potassium tonight and in the a.m. 5. Calcium gluconate given for stabilization 6. Hypoglycemia in the setting of diabetes mellitus type 2 1. Hypoglycemic at arrival, 68 2. Dextrose 5% given in ED 3. Repeat CBG pending    DVT Prophylaxis-   Heparin- SCDs   AM Labs Ordered, also please review Full Orders  Family Communication: No family at bedside  Code Status: Full  Admission status: Inpatient :The appropriate admission status for this patient is INPATIENT. Inpatient status is judged to be reasonable and necessary in order to provide the required intensity of service to ensure the patient's safety. The patient's presenting symptoms, physical exam findings, and initial radiographic and laboratory data in the context of their chronic comorbidities is felt to place them at high risk for further clinical deterioration. Furthermore, it is not anticipated that the patient will be medically stable for discharge from the hospital within 2 midnights of admission. The following factors support the admission status of inpatient.     The patient's presenting symptoms include generalized weakness The worrisome physical exam findings include  tachycardia The initial radiographic and laboratory data are worrisome because of leukocytosis 15.6, decreased bicarb at 11, renal failure with a creatinine of 6.44, hyperkalemia The chronic co-morbidities include CVA, urinary incontinence, hypertension, diabetes mellitus type 2        * I certify that at the point of admission it is my clinical judgment that the patient will require inpatient hospital care spanning beyond 2 midnights from the point of admission due to high intensity of service, high risk for further deterioration and high frequency of surveillance required.*  Time spent in minutes : 64   Dhara Schepp B Zierle-Ghosh DO

## 2020-01-01 NOTE — ED Triage Notes (Signed)
Patient brought in via EMS from Group Home [facility]). Alert and oriented. Airway patent. EMS called out for change in mobility that started yesterday per group home. No one able to give last well known. Per staff patient has had some falls. Patient denies hitting head or LOC. Denies any pain, nausea, vomiting, or diarrhea. Patient notably weak upon standing per paramedic.

## 2020-01-02 ENCOUNTER — Inpatient Hospital Stay (HOSPITAL_COMMUNITY): Payer: Medicaid Other

## 2020-01-02 DIAGNOSIS — R6520 Severe sepsis without septic shock: Secondary | ICD-10-CM

## 2020-01-02 DIAGNOSIS — R1084 Generalized abdominal pain: Secondary | ICD-10-CM

## 2020-01-02 DIAGNOSIS — A419 Sepsis, unspecified organism: Secondary | ICD-10-CM

## 2020-01-02 DIAGNOSIS — E875 Hyperkalemia: Secondary | ICD-10-CM

## 2020-01-02 DIAGNOSIS — E11649 Type 2 diabetes mellitus with hypoglycemia without coma: Secondary | ICD-10-CM

## 2020-01-02 LAB — CBG MONITORING, ED
Glucose-Capillary: 45 mg/dL — ABNORMAL LOW (ref 70–99)
Glucose-Capillary: 140 mg/dL — ABNORMAL HIGH (ref 70–99)
Glucose-Capillary: 123 mg/dL — ABNORMAL HIGH (ref 70–99)
Glucose-Capillary: 62 mg/dL — ABNORMAL LOW (ref 70–99)
Glucose-Capillary: 68 mg/dL — ABNORMAL LOW (ref 70–99)
Glucose-Capillary: 90 mg/dL (ref 70–99)
Glucose-Capillary: 100 mg/dL — ABNORMAL HIGH (ref 70–99)
Glucose-Capillary: 121 mg/dL — ABNORMAL HIGH (ref 70–99)
Glucose-Capillary: 91 mg/dL (ref 70–99)

## 2020-01-02 LAB — BASIC METABOLIC PANEL
Anion gap: 20 — ABNORMAL HIGH (ref 5–15)
BUN: 86 mg/dL — ABNORMAL HIGH (ref 6–20)
CO2: 12 mmol/L — ABNORMAL LOW (ref 22–32)
Calcium: 6 mg/dL — CL (ref 8.9–10.3)
Chloride: 107 mmol/L (ref 98–111)
Creatinine, Ser: 6.01 mg/dL — ABNORMAL HIGH (ref 0.44–1.00)
GFR, Estimated: 8 mL/min — ABNORMAL LOW (ref >60)
Glucose, Bld: 102 mg/dL — ABNORMAL HIGH (ref 70–99)
Potassium: 5.6 mmol/L — ABNORMAL HIGH (ref 3.5–5.1)
Sodium: 139 mmol/L (ref 135–145)

## 2020-01-02 LAB — COMPREHENSIVE METABOLIC PANEL
ALT: 40 U/L (ref 0–44)
AST: 104 U/L — ABNORMAL HIGH (ref 15–41)
Albumin: 2.6 g/dL — ABNORMAL LOW (ref 3.5–5.0)
Alkaline Phosphatase: 46 U/L (ref 38–126)
Anion gap: 21 — ABNORMAL HIGH (ref 5–15)
BUN: 86 mg/dL — ABNORMAL HIGH (ref 6–20)
CO2: 12 mmol/L — ABNORMAL LOW (ref 22–32)
Calcium: 7 mg/dL — ABNORMAL LOW (ref 8.9–10.3)
Chloride: 106 mmol/L (ref 98–111)
Creatinine, Ser: 6.19 mg/dL — ABNORMAL HIGH (ref 0.44–1.00)
GFR, Estimated: 8 mL/min — ABNORMAL LOW (ref >60)
Glucose, Bld: 54 mg/dL — ABNORMAL LOW (ref 70–99)
Potassium: 5.6 mmol/L — ABNORMAL HIGH (ref 3.5–5.1)
Sodium: 139 mmol/L (ref 135–145)
Total Bilirubin: 0.5 mg/dL (ref 0.3–1.2)
Total Protein: 6.6 g/dL (ref 6.5–8.1)

## 2020-01-02 LAB — BLOOD GAS, VENOUS
Acid-base deficit: 14.6 mmol/L — ABNORMAL HIGH (ref 0.0–2.0)
Bicarbonate: 12.3 mmol/L — ABNORMAL LOW (ref 20.0–28.0)
Drawn by: 1517
FIO2: 21
O2 Saturation: 51.4 %
Patient temperature: 37
pCO2, Ven: 35.6 mmHg — ABNORMAL LOW (ref 44.0–60.0)
pH, Ven: 7.164 — CL (ref 7.250–7.430)
pO2, Ven: 34 mmHg (ref 32.0–45.0)

## 2020-01-02 LAB — RENAL FUNCTION PANEL
Albumin: 2.4 g/dL — ABNORMAL LOW (ref 3.5–5.0)
Anion gap: 20 — ABNORMAL HIGH (ref 5–15)
BUN: 86 mg/dL — ABNORMAL HIGH (ref 6–20)
CO2: 13 mmol/L — ABNORMAL LOW (ref 22–32)
Calcium: 6.4 mg/dL — CL (ref 8.9–10.3)
Chloride: 107 mmol/L (ref 98–111)
Creatinine, Ser: 6.18 mg/dL — ABNORMAL HIGH (ref 0.44–1.00)
GFR, Estimated: 8 mL/min — ABNORMAL LOW
Glucose, Bld: 78 mg/dL (ref 70–99)
Phosphorus: 8.1 mg/dL — ABNORMAL HIGH (ref 2.5–4.6)
Potassium: 6 mmol/L — ABNORMAL HIGH (ref 3.5–5.1)
Sodium: 140 mmol/L (ref 135–145)

## 2020-01-02 LAB — PROCALCITONIN: Procalcitonin: 0.37 ng/mL

## 2020-01-02 LAB — MAGNESIUM: Magnesium: 1 mg/dL — ABNORMAL LOW (ref 1.7–2.4)

## 2020-01-02 LAB — TSH: TSH: 1.02 u[IU]/mL (ref 0.350–4.500)

## 2020-01-02 LAB — LIPASE, BLOOD: Lipase: 15 U/L (ref 11–51)

## 2020-01-02 LAB — CORTISOL-AM, BLOOD: Cortisol - AM: 26.4 ug/dL — ABNORMAL HIGH (ref 6.7–22.6)

## 2020-01-02 LAB — PROTIME-INR
INR: 1.1 (ref 0.8–1.2)
Prothrombin Time: 13.5 seconds (ref 11.4–15.2)

## 2020-01-02 LAB — LACTIC ACID, PLASMA: Lactic Acid, Venous: 6.3 mmol/L (ref 0.5–1.9)

## 2020-01-02 MED ORDER — NYSTATIN 100000 UNIT/GM EX CREA
TOPICAL_CREAM | Freq: Two times a day (BID) | CUTANEOUS | Status: DC
  Administered 2020-01-02 – 2020-01-04 (×5): 1 via TOPICAL
  Filled 2020-01-02: qty 15

## 2020-01-02 MED ORDER — CALCIUM GLUCONATE-NACL 1-0.675 GM/50ML-% IV SOLN
1.0000 g | INTRAVENOUS | Status: AC
  Administered 2020-01-02 – 2020-01-03 (×2): 1000 mg via INTRAVENOUS
  Filled 2020-01-02 (×2): qty 50

## 2020-01-02 MED ORDER — SODIUM CHLORIDE 0.9 % IV BOLUS
500.0000 mL | Freq: Once | INTRAVENOUS | Status: AC
  Administered 2020-01-02: 500 mL via INTRAVENOUS

## 2020-01-02 MED ORDER — DEXTROSE 50 % IV SOLN
1.0000 | Freq: Once | INTRAVENOUS | Status: AC
  Administered 2020-01-02: 50 mL via INTRAVENOUS

## 2020-01-02 MED ORDER — SODIUM CHLORIDE 0.9 % IV BOLUS: 1000.0000 mL | Freq: Once | INTRAVENOUS | Status: DC

## 2020-01-02 MED ORDER — INSULIN ASPART 100 UNIT/ML ~~LOC~~ SOLN: 0.0000 [IU] | Freq: Three times a day (TID) | SUBCUTANEOUS | Status: DC

## 2020-01-02 MED ORDER — CALCIUM GLUCONATE-NACL 2-0.675 GM/100ML-% IV SOLN
2.0000 g | Freq: Once | INTRAVENOUS | Status: DC
  Filled 2020-01-02: qty 100

## 2020-01-02 MED ORDER — SODIUM ZIRCONIUM CYCLOSILICATE 5 G PO PACK
5.0000 g | PACK | Freq: Once | ORAL | Status: AC
  Administered 2020-01-02: 5 g via ORAL
  Filled 2020-01-02: qty 1

## 2020-01-02 MED ORDER — DEXTROSE 5 % IV SOLN: INTRAVENOUS | Status: DC

## 2020-01-02 MED ORDER — SODIUM CHLORIDE 0.9 % IV SOLN: INTRAVENOUS | Status: DC

## 2020-01-02 MED ORDER — STERILE WATER FOR INJECTION IV SOLN
INTRAVENOUS | Status: DC
  Filled 2020-01-02 (×10): qty 850

## 2020-01-02 MED ORDER — DEXTROSE 50 % IV SOLN
INTRAVENOUS | Status: AC
  Administered 2020-01-02: 50 mL via INTRAVENOUS
  Filled 2020-01-02: qty 50

## 2020-01-02 NOTE — ED Notes (Signed)
Pt given orange jello

## 2020-01-02 NOTE — ED Notes (Signed)
Order changed  atttempted to call [facility] Abbie unsuccessful

## 2020-01-02 NOTE — ED Notes (Signed)
Verbal order from Dr Carren Rang to bolus from maintenance bag of NS.

## 2020-01-02 NOTE — ED Notes (Signed)
Dr Carren Rang paged regarding pt BP

## 2020-01-02 NOTE — ED Notes (Signed)
Critical from lab   Cal 6.0

## 2020-01-02 NOTE — ED Notes (Signed)
No new orders received from Dr Carren Rang at this time.

## 2020-01-02 NOTE — ED Notes (Signed)
Pt bed linens and gown changed due to bowel incontinence. Peri care performed and new purewick placed on pt.

## 2020-01-02 NOTE — Consult Note (Signed)
Reason for Consult: AKI/CKD stage 3 Referring Physician:  Irene Limbo, MD  [name] [name] is an 54 y.o. female has a PMH significant for HTN, DM type 2, CVA, depression/anxiety, and CKD stage 3b who was brought to [facility] ED via EMS from group home after staff noted altered mental status and generalized weakness.  In the ED she was hypotensive at 78/59, temp 97.4, confused and unable to communicate with staff.  She was started on IVF's and started workup for sepsis.  Labs revealed CO2 of 11, lactate of 5.4, BUN 91, Cr 6.44, K 6.6, BNP 54, WBC 15.6, Hgb 9.8.  We were consulted to further evaluate and manage her AKI/CKD stage 3b.  The trend in Scr is seen below.  Of note, she had been on lisinopril and metformin prior to admission.  She denies any N/V/D, dysuria, pyuria, hematuria, urgency, frequency, or retention.  She does not remember what happened yesterday.  Trend in Creatinine: Creatinine, Ser  Date/Time Value Ref Range Status  01/02/2020 01:17 AM 6.19 (H) 0.44 - 1.00 mg/dL Final  58/10/9831 82:50 PM 6.44 (H) 0.44 - 1.00 mg/dL Final  53/97/6734 19:37 AM 1.12 (H) 0.44 - 1.00 mg/dL Final  90/24/0973 53:29 AM 1.93 (H) 0.44 - 1.00 mg/dL Final  92/42/6834 19:62 PM 2.59 (H) 0.44 - 1.00 mg/dL Final  22/97/9892 11:94 AM 0.92 0.44 - 1.00 mg/dL Final  17/40/8144 81:85 AM 0.79 0.44 - 1.00 mg/dL Final  63/14/9702 63:78 AM 0.89 0.44 - 1.00 mg/dL Final  58/85/0277 41:28 AM 0.87 0.44 - 1.00 mg/dL Final  78/67/6720 94:70 AM 0.94 0.44 - 1.00 mg/dL Final  96/28/3662 94:76 PM 1.00 0.44 - 1.00 mg/dL Final  54/65/0354 65:68 PM 0.94 0.44 - 1.00 mg/dL Final  12/75/1700 17:49 AM 0.93 0.44 - 1.00 mg/dL Final  44/96/7591 63:84 AM 0.90 0.44 - 1.00 mg/dL Final  66/59/9357 01:77 PM 0.89 0.44 - 1.00 mg/dL Final  93/90/3009 23:30 PM 1.02 (H) 0.44 - 1.00 mg/dL Final  07/62/2633 35:45 PM 0.72 0.44 - 1.00 mg/dL Final  62/56/3893 73:42 PM 0.71 0.44 - 1.00 mg/dL Final  87/68/1157 26:20 PM 0.59 0.44 - 1.00 mg/dL Final   35/59/7416 38:45 AM 0.84 0.44 - 1.00 mg/dL Final    PMH:   Past Medical History:  Diagnosis Date  . Acute cystitis   . Anxiety   . Arthritis   . CVA (cerebral vascular accident) (HCC)   . Depression   . Diabetes mellitus without complication (HCC)   . Dyspnea   . Fecal incontinence   . Hypertension   . Incontinence   . Incontinence of urine   . Stroke (HCC) 2004   x 2  . Urinary incontinence     PSH:   Past Surgical History:  Procedure Laterality Date  . COLONOSCOPY WITH PROPOFOL N/A 11/27/2018   Procedure: COLONOSCOPY WITH PROPOFOL;  Surgeon: Toledo, Boykin Nearing, MD;  Location: [facility] ENDOSCOPY;  Service: Gastroenterology;  Laterality: N/A;  . ESOPHAGOGASTRODUODENOSCOPY (EGD) WITH PROPOFOL N/A 11/27/2018   Procedure: ESOPHAGOGASTRODUODENOSCOPY (EGD) WITH PROPOFOL;  Surgeon: Toledo, Boykin Nearing, MD;  Location: [facility] ENDOSCOPY;  Service: Gastroenterology;  Laterality: N/A;  . NO PAST SURGERIES    . TOTAL KNEE ARTHROPLASTY Right 10/03/2017   Procedure: TOTAL KNEE ARTHROPLASTY;  Surgeon: Lyndle Herrlich, MD;  Location: [facility] ORS;  Service: Orthopedics;  Laterality: Right;    Allergies:  Allergies  Allergen Reactions  . Latex Rash    Severe itching.    Medications:   Prior to Admission medications   Medication  Sig Start Date End Date Taking? Authorizing Provider  albuterol (PROVENTIL HFA;VENTOLIN HFA) 108 (90 Base) MCG/ACT inhaler Inhale 2 puffs into the lungs every 4 (four) hours as needed for wheezing or shortness of breath.   Yes [provider]  amLODipine (NORVASC) 10 MG tablet Take 10 mg by mouth every morning.    Yes [provider]  aspirin EC 81 MG tablet Take 1 tablet (81 mg total) by mouth daily. Swallow whole. 10/22/19 10/21/20 Yes Vassie Loll, MD  atorvastatin (LIPITOR) 40 MG tablet Take 40 mg by mouth daily at 8 pm. (2000)   Yes [provider]  busPIRone (BUSPAR) 10 MG tablet Take 10 mg by mouth 3 (three) times daily.   Yes [provider]  cholecalciferol (VITAMIN D3) 25 MCG (1000 UT) tablet Take 1,000 Units by mouth every morning.   Yes [provider]  clopidogrel (PLAVIX) 75 MG tablet Take 1 tablet (75 mg total) by mouth daily with breakfast. 10/23/19  Yes Vassie Loll, MD  Dexlansoprazole 30 MG capsule Take 30 mg by mouth daily.   Yes [provider]  diclofenac sodium (VOLTAREN) 1 % GEL Apply 2 g topically 4 (four) times daily as needed. Patient taking differently: Apply 2 g topically in the morning and at bedtime.  09/13/18  Yes Briant Cedar, MD  ferrous sulfate 325 (65 FE) MG tablet Take 1 tablet (325 mg total) by mouth 2 (two) times daily with a meal. Patient taking differently: Take 325 mg by mouth daily with breakfast.  09/13/18 01/01/20 Yes Briant Cedar, MD  Fluticasone-Salmeterol (ADVAIR) 100-50 MCG/DOSE AEPB Inhale 1 puff into the lungs 2 (two) times daily.   Yes [provider]  folic acid (FOLVITE) 1 MG tablet Take 1 mg by mouth daily.   Yes [provider]  furosemide (LASIX) 20 MG tablet Take 1 tablet (20 mg total) by mouth 2 (two) times daily. (0800) Patient taking differently: Take 30 mg by mouth 2 (two) times daily. (0800) 10/22/19  Yes Vassie Loll, MD  lisinopril (PRINIVIL,ZESTRIL) 20 MG tablet Take 20 mg by mouth daily. (0800)   Yes [provider]  megestrol (MEGACE) 40 MG tablet Take 40 mg by mouth daily.   Yes [provider]  metFORMIN (GLUCOPHAGE-XR) 500 MG 24 hr tablet Take 1,000 mg by mouth 2 (two) times daily. (0800 & 2000) 09/05/17  Yes [provider]  traMADol (ULTRAM) 50 MG tablet Take 50 mg by mouth 4 (four) times daily.  09/30/19  Yes [provider]  traZODone (DESYREL) 100 MG tablet Take 100 mg by mouth at bedtime.   Yes [provider]  vitamin B-12 (CYANOCOBALAMIN) 1000 MCG tablet Take 1,000 mcg by mouth daily.   Yes [provider]  cholecalciferol (VITAMIN D) 25 MCG (1000  UNIT) tablet Take 1 tablet (1,000 Units total) by mouth daily. (0800) Patient not taking: Reported on 01/01/2020 10/22/19   Vassie Loll, MD  docusate sodium (COLACE) 100 MG capsule Take 1 capsule (100 mg total) by mouth 2 (two) times daily. Patient not taking: Reported on 01/01/2020 10/22/19   Vassie Loll, MD  HYDROcodone-acetaminophen (NORCO/VICODIN) 5-325 MG tablet Take 1-2 tablets by mouth every 6 (six) hours as needed for severe pain (pain score 4-6). Patient not taking: Reported on 01/01/2020 10/22/19   Vassie Loll, MD  nicotine (NICODERM CQ - DOSED IN MG/24 HOURS) 21 mg/24hr patch Place 1 patch (21 mg total) onto the skin daily. Patient not taking: Reported on 01/01/2020 10/23/19  Vassie Loll, MD  pantoprazole (PROTONIX) 40 MG tablet Take 1 tablet (40 mg total) by mouth daily. 09/14/18 10/20/19  Briant Cedar, MD    Inpatient medications: . amLODipine  10 mg Oral q morning - 10a  . aspirin EC  81 mg Oral Daily  . atorvastatin  40 mg Oral Q2000  . busPIRone  10 mg Oral TID  . clopidogrel  75 mg Oral Q breakfast  . ferrous sulfate  325 mg Oral Q breakfast  . folic acid  1 mg Oral Daily  . heparin  5,000 Units Subcutaneous Q8H  . insulin aspart  0-9 Units Subcutaneous TID WC  . megestrol  40 mg Oral Daily  . mometasone-formoterol  2 puff Inhalation BID  . sodium zirconium cyclosilicate  5 g Oral Once  . traZODone  100 mg Oral QHS  . vancomycin variable dose per unstable renal function (pharmacist dosing)   Does not apply See admin instructions  . vitamin B-12  1,000 mcg Oral Daily  . Vitamin D3  1,000 Units Oral Daily    Discontinued Meds:   Medications Discontinued During This Encounter  Medication Reason  . dextrose 5 % with KCl 20 mEq / L  infusion   . mometasone-formoterol (DULERA) 100-5 MCG/ACT inhaler 2 puff   . dextrose 5 % 1,000 mL with potassium chloride 20 mEq, sodium bicarbonate 150 mEq infusion     Social History:  reports that she has been smoking  cigarettes. She has never used smokeless tobacco. She reports that she does not drink alcohol and does not use drugs.  Family History:   Family History  Problem Relation Age of Onset  . Heart disease Mother   . Heart disease Father     Review of systems not obtained due to patient factors. Weight change:   Intake/Output Summary (Last 24 hours) at 01/02/2020 1009 Last data filed at 01/02/2020 0215 Gross per 24 hour  Intake 8876.67 ml  Output --  Net 8876.67 ml   BP 94/65   Pulse 99   Temp (!) 97.4 F (36.3 C) (Oral)   Resp (!) 28   Ht 5\' 8"  (1.727 m)   Wt 61.7 kg   SpO2 100%   BMI 20.68 kg/m  Vitals:   01/02/20 0815 01/02/20 0845 01/02/20 0900 01/02/20 0909  BP: 92/67 91/67 94/65    Pulse: 100 94 99   Resp: (!) 24 15 (!) 28   Temp:      TempSrc:      SpO2: 100% 94% 100% 100%  Weight:      Height:         General appearance: fatigued, slowed mentation and difficulty concentrating Head: Normocephalic, without obvious abnormality, atraumatic Resp: clear to auscultation bilaterally Cardio: regular rate and rhythm, S1, S2 normal, no murmur, click, rub or gallop GI: soft, non-tender; bowel sounds normal; no masses,  no organomegaly Extremities: edema 1+ bilateral pedal edema and some erythema and induration of dorsum of feet L>R  Labs: Basic Metabolic Panel: Recent Labs  Lab 01/01/20 1830 01/02/20 0117  NA 139 139  K 6.6* 5.6*  CL 105 106  CO2 11* 12*  GLUCOSE 68* 54*  BUN 91* 86*  CREATININE 6.44* 6.19*  ALBUMIN 3.0* 2.6*  CALCIUM 7.1* 7.0*   Liver Function Tests: Recent Labs  Lab 01/01/20 1830 01/02/20 0117  AST 103* 104*  ALT 40 40  ALKPHOS 53 46  BILITOT 0.5 0.5  PROT 7.5 6.6  ALBUMIN 3.0* 2.6*   No  results for input(s): LIPASE, AMYLASE in the last 168 hours. No results for input(s): AMMONIA in the last 168 hours. CBC: Recent Labs  Lab 01/01/20 1830  WBC 15.6*  NEUTROABS 14.0*  HGB 9.8*  HCT 32.0*  MCV 99.4  PLT 420*    PT/INR: @LABRCNTIP (inr:5) Cardiac Enzymes: )No results for input(s): CKTOTAL, CKMB, CKMBINDEX, TROPONINI in the last 168 hours. CBG: Recent Labs  Lab 01/02/20 0224 01/02/20 0254 01/02/20 0357 01/02/20 0612 01/02/20 0742  GLUCAP 45* 140* 123* 62* 68*    Iron Studies: No results for input(s): IRON, TIBC, TRANSFERRIN, FERRITIN in the last 168 hours.  Xrays/Other Studies: CT Head Wo Contrast  Result Date: 01/01/2020 CLINICAL DATA:  Altered level of consciousness, history of falls EXAM: CT HEAD WITHOUT CONTRAST TECHNIQUE: Contiguous axial images were obtained from the base of the skull through the vertex without intravenous contrast. COMPARISON:  10/20/2019 FINDINGS: Brain: Hypodensities of the bilateral basal ganglia and periventricular white matter are stable, consistent with chronic small vessel ischemic changes. No signs of acute infarct or hemorrhage. The lateral ventricles and remaining midline structures are unremarkable. No acute extra-axial fluid collections. No mass effect. Vascular: No hyperdense vessel or unexpected calcification. Skull: Normal. Negative for fracture or focal lesion. Sinuses/Orbits: No acute finding. Other: None. IMPRESSION: 1. Chronic small vessel ischemic changes. No acute intracranial process. Electronically Signed   By: Sharlet SalinaMichael  Brown M.D.   On: 01/01/2020 16:41   DG Chest Port 1 View  Result Date: 01/01/2020 CLINICAL DATA:  Sepsis, weakness EXAM: PORTABLE CHEST 1 VIEW COMPARISON:  09/07/2018 FINDINGS: The heart size and mediastinal contours are within normal limits. Both lungs are clear. The visualized skeletal structures are unremarkable. IMPRESSION: No active disease. Electronically Signed   By: Sharlet SalinaMichael  Brown M.D.   On: 01/01/2020 16:17     Assessment/Plan: 1.  AKI/CKD stage IIIb- presumably due to ischemic ATN in setting of sepsis/hypovolemia and concomitant ACE inhibition.  BUN/Cr and K improving with IVF's.  1. Continue to hold ACE and  metformin 2. Continue with IVF's but will change to isotonic bicarb 3. Check renal US to r/o obstruction 4. No indication for dialysis at this time.  Will continue to follow UOP and Scr.  2. Hyperkalemia- improving.  Will recheck. 3. Metabolic acidosis- due to #1.  Will change IVF's to isotonic bicarb and follow.  4. SIRS with ongoing hypotension- blood and urine cultures pending.  Started on cefepime and vancomycin 5. Lactic acidosis- remains elevated 6. AMS- due to sepsis and hypotension.  CT scan negative for acute changes.  She is able to answer simple questions now. 7. DM- per primary. 8. Bilateral pedal edema- possible cellulitis of left foot.  Denies any tenderness.   Jomarie LongsJoseph A Febe Champa 01/02/2020, 10:09 AM

## 2020-01-02 NOTE — ED Notes (Signed)
Pt incontinent of stool   Cleaned   Changed by NT Delorise Jackson

## 2020-01-02 NOTE — ED Notes (Signed)
Dr Bruna Potter paged

## 2020-01-02 NOTE — Progress Notes (Signed)
PROGRESS NOTE  [name] [name] NWG:[id number] DOB: February 07, 1966 DOA: 01/01/2020 PCP: Terrial Rhodes, MD  Brief History   53yow PMH strok, DM type 2, resides in group home presented w/ generalzied weakness, foun dto generally weak, hypotensive. Sepsis workup initiated. Labs revealed lactic acidosis, metabolic acidosis, acute renal failure. Admitted for eval and tx of acute renal failure and acidosis. Sepsis considered on admission but no source.    A & P  AKI on CKD stage IIIb w/ metabolic acidosis and hyperkalemia complicated by lisinopril and metformin --etiology unclear, not clearly septic --hold metformin and ACE-I, continue IVF. Renal u/s no hydro. --treat hyperkalemia w/ Lokelma and trend --Sodium bicarb infusion  --appreciate nephrology involvement  Possible sepsis, no source identified. BC pending, CXR NAD, reported abd pain, but exam benign, eating and AXR nonspecific --lactic acid remained elevated but discordant w/ clinical picture, doubt sepsis, probably related to AKI and metformin --will continue abx for now but stop 11/13 if no source found  Hypoglycemia, DM type 2 --probably from AKI and poor oral intake --monitor CBG. No metformin.  Disposition Plan:  Discussion: acutely ill w/ guarded prognosis with AKI, metabolic acidosis, hyperkalemia  Status is: Inpatient  Remains inpatient appropriate because:IV treatments appropriate due to intensity of illness or inability to take PO and Inpatient level of care appropriate due to severity of illness   Dispo: The patient is from: Group home              Anticipated d/c is to: Group home              Anticipated d/c date is: 3 days              Patient currently is not medically stable to d/c.  DVT prophylaxis: heparin injection 5,000 Units Start: 01/01/20 2330 SCDs Start: 01/01/20 2315   Code Status: Full Code Family Communication: none  Brendia Sacks, MD  [facility] Direct contact: see [email or web address]  (further directions at bottom of note if needed) 7PM-7AM contact night coverage as at bottom of note 01/02/2020, 2:20 PM  LOS: 1 day   Significant Hospital Events   .    Consults:  . Nephrology    Procedures:  .   Significant Diagnostic Tests:  . CXR NAD . AXR nonspecific prominent SB loops no obstruction . Renal u/s no hydro   Micro Data:  .    Antimicrobials:  .   Interval History/Subjective  CC: f/u AKI  Feels ok today, ate ok, breathing ok. Reports some pain in abdomen  Objective   Vitals:  Vitals:   01/02/20 1345 01/02/20 1400  BP: (!) 76/58 (!) 87/69  Pulse: 97 89  Resp: 17 19  Temp:    SpO2: 100% (!) 65%    Exam:  Constitutional:   . Appears calm and comfortable in ED, 1/2 eaten lunch tray noted ENMT:  . grossly normal hearing  . Lips appear normal Respiratory:  . CTA bilaterally, no w/r/r.  . Respiratory effort normal. Cardiovascular:  . RRR, no m/r/g . 1+ BLE extremity edema   Abdomen:  . Soft, ntnd . Macerated skin around umbilicus c/w moisture Musculoskeletal:  . Appears grossly unremarkable Psychiatric:  . Mental status o Mood, affect appropriate  I have personally reviewed the following:   Today's Data  . Glucose 68 > 90 . K+ 6.6 > 6.0 . AST 103 > 104 . Lactic acid 5.4 > 4.3 ? 6.3 . procalcitonin .37 . Lipase WNL  Scheduled Meds: . amLODipine  10 mg Oral q morning - 10a  . aspirin EC  81 mg Oral Daily  . atorvastatin  40 mg Oral Q2000  . busPIRone  10 mg Oral TID  . clopidogrel  75 mg Oral Q breakfast  . ferrous sulfate  325 mg Oral Q breakfast  . folic acid  1 mg Oral Daily  . heparin  5,000 Units Subcutaneous Q8H  . insulin aspart  0-9 Units Subcutaneous TID WC  . megestrol  40 mg Oral Daily  . mometasone-formoterol  2 puff Inhalation BID  . nystatin cream   Topical BID  . traZODone  100 mg Oral QHS  . vancomycin variable dose per unstable renal function (pharmacist dosing)   Does not apply See admin instructions    . vitamin B-12  1,000 mcg Oral Daily  . Vitamin D3  1,000 Units Oral Daily   Continuous Infusions: . ceFEPime (MAXIPIME) IV    . dextrose Stopped (01/02/20 0259)  .  sodium bicarbonate (isotonic) infusion in sterile water 125 mL/hr at 01/02/20 1133    Principal Problem:   AKI (acute kidney injury) (HCC) Active Problems:   Sepsis (HCC)   Type 2 diabetes mellitus with hypoglycemia without coma (HCC)   LOS: 1 day   How to contact the [facility] Attending or Consulting provider 7A - 7P or covering provider during after hours 7P -7A, for this patient?  1. Check the care team in [facility] and look for a) attending/consulting TRH provider listed and b) the [facility] team listed 2. Log into [email or web address] and use [facility] universal password to access. If you do not have the password, please contact the hospital operator. 3. Locate the [facility] provider you are looking for under [facility] and page to a number that you can be directly reached. 4. If you still have difficulty reaching the provider, please page the [facility] (Director on Call) for the Hospitalists listed on amion for assistance.

## 2020-01-02 NOTE — TOC Initial Note (Signed)
Transition of Care [facility]) - Initial/Assessment Note   Patient Details  Name: [name] [name] MRN: [id number] Date of Birth: 02/07/1966  [facility]) CM/SW Contact:    Ewing Schlein, LCSW Phone Number: 01/02/2020, 2:29 PM  Clinical Narrative: Patient is a 54 year old female who was admitted for AKI, sepsis, and type 2 diabetes mellitus with hypoglycemia without coma. Readmission checklist completed due to high readmission score. CSW made multiple attempts to call the facility administrator, Jimmye Norman, with [facility] but was unable to reach anyone or leave a voicemail. CSW attempted to call [place] DSS guardianship supervisor, Myriam Forehand [phone]), as DSS is the patient's guardian, but was unable to reach her so CSW left VM requesting call back.  CSW spoke with [name] from [facility] to complete assessment. Per Ms. Nedra Hai, patient is able to complete her ADLs with some assistance. Patient's only DME is a walker and she is not currently active with [facility] services. Ms. Nedra Hai reported patient takes her medications as prescribed. Ms. Nedra Hai reported the patient can return to the facility at discharge. FL2 started. TOC to follow.  Expected Discharge Plan: Home/Self Care Barriers to Discharge: Continued Medical Work up  Patient Goals and CMS Choice Patient states their goals for this hospitalization and ongoing recovery are:: Return to [facility] Choice offered to / list presented to : NA  Expected Discharge Plan and Services Expected Discharge Plan: Home/Self Care In-house Referral: Clinical Social Work Discharge Planning Services: NA Post Acute Care Choice: NA Living arrangements for the past 2 months: Group Home              DME Arranged: N/A DME Agency: NA HH Arranged: NA HH Agency: NA  Prior Living Arrangements/Services Living arrangements for the past 2 months: Group Home Lives with:: Facility Resident Patient language and need for  interpreter reviewed:: Yes Do you feel safe going back to the place where you live?: Yes      Need for Family Participation in Patient Care: Yes (Comment) Care giver support system in place?: Yes (comment) Current home services: DME [facility]) Criminal Activity/Legal Involvement Pertinent to Current Situation/Hospitalization: No - Comment as needed  Permission Sought/Granted Permission sought to share information with : Facility Medical sales representative  Emotional Assessment Appearance:: Appears stated age Orientation: : Oriented to Place, Oriented to Self Alcohol / Substance Use: Tobacco Use Psych Involvement: No (comment)  Admission diagnosis:  AKI (acute kidney injury) (HCC) [N17.9] Patient Active Problem List   Diagnosis Date Noted  . Sepsis (HCC) 01/02/2020  . Type 2 diabetes mellitus with hypoglycemia without coma (HCC) 01/02/2020  . AKI (acute kidney injury) (HCC) 01/01/2020  . Hyperlipidemia   . Essential hypertension   . Depression with anxiety   . Chronic obstructive pulmonary disease (HCC)   . Tobacco abuse 10/21/2019  . Sensory disturbance 10/21/2019  . TIA (transient ischemic attack) 10/20/2019  . Altered mental status   . Loss of consciousness (HCC) 09/08/2018  . Encephalopathy 09/08/2018  . 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 Anxiety 04/02/2017  . Urinary incontinence 01/25/2016  . Fecal incontinence 01/25/2016  . Dysthymia 01/25/2016   PCP:  Terrial Rhodes, MD Pharmacy:   [facility] - [place], [place] - [address] [place] [address] Phone: [phone] Fax: [phone]  Readmission Risk Interventions Readmission Risk Prevention Plan 01/02/2020 10/04/2017  Post Dischage Appt - Complete  Medication Screening - Complete  Transportation Screening  Complete Complete  PCP follow-up - Complete  HRI or Home Care Consult Complete -  Social Work Consult for Recovery Care  Planning/Counseling Complete -  Palliative Care Screening Not Applicable -  Medication Review (RN Care Manager) Complete -  Some recent data might be hidden

## 2020-01-02 NOTE — Hospital Course (Addendum)
53yow PMH strok, DM type 2, resides in group home presented w/ generalzied weakness, found to generally weak, hypotensive. Sepsis workup initiated. Labs revealed lactic acidosis, metabolic acidosis, acute renal failure. Admitted for eval and tx of acute renal failure and acidosis. Seen by nephrology. Renal fxn improving w/ IVF, hyperkalemia and acidosis resolved. Calcium remains low and will replete. Continue IVF to see further improvement prior to release, anticipated for 11/17.

## 2020-01-02 NOTE — ED Notes (Signed)
ICU 3 assigned but never made ready   Bed is now taken away   Pt awaiting room assignemt

## 2020-01-02 NOTE — ED Notes (Signed)
Date and time results received: 01/02/20 0156 (use smartphrase ".now" to insert current time)  Test: Venous Blood Gas pH Critical Value: 7.164  Name of Provider Notified: Carren Rang, MD  Orders Received? Or Actions Taken?:

## 2020-01-03 ENCOUNTER — Encounter (HOSPITAL_COMMUNITY): Payer: Self-pay | Admitting: Family Medicine

## 2020-01-03 DIAGNOSIS — N189 Chronic kidney disease, unspecified: Secondary | ICD-10-CM

## 2020-01-03 DIAGNOSIS — L8990 Pressure ulcer of unspecified site, unspecified stage: Secondary | ICD-10-CM | POA: Insufficient documentation

## 2020-01-03 DIAGNOSIS — D631 Anemia in chronic kidney disease: Secondary | ICD-10-CM

## 2020-01-03 DIAGNOSIS — R1310 Dysphagia, unspecified: Secondary | ICD-10-CM

## 2020-01-03 DIAGNOSIS — N170 Acute kidney failure with tubular necrosis: Secondary | ICD-10-CM

## 2020-01-03 DIAGNOSIS — N1832 Chronic kidney disease, stage 3b: Secondary | ICD-10-CM

## 2020-01-03 LAB — PROCALCITONIN: Procalcitonin: 0.33 ng/mL

## 2020-01-03 LAB — CBC
HCT: 30.2 % — ABNORMAL LOW (ref 36.0–46.0)
Hemoglobin: 9.8 g/dL — ABNORMAL LOW (ref 12.0–15.0)
MCH: 30.9 pg (ref 26.0–34.0)
MCHC: 32.5 g/dL (ref 30.0–36.0)
MCV: 95.3 fL (ref 80.0–100.0)
Platelets: 357 10*3/uL (ref 150–400)
RBC: 3.17 MIL/uL — ABNORMAL LOW (ref 3.87–5.11)
RDW: 12.7 % (ref 11.5–15.5)
WBC: 11.5 10*3/uL — ABNORMAL HIGH (ref 4.0–10.5)
nRBC: 0 % (ref 0.0–0.2)

## 2020-01-03 LAB — RENAL FUNCTION PANEL
Albumin: 2.3 g/dL — ABNORMAL LOW (ref 3.5–5.0)
Anion gap: 18 — ABNORMAL HIGH (ref 5–15)
BUN: 87 mg/dL — ABNORMAL HIGH (ref 6–20)
CO2: 16 mmol/L — ABNORMAL LOW (ref 22–32)
Calcium: 6.2 mg/dL — CL (ref 8.9–10.3)
Chloride: 106 mmol/L (ref 98–111)
Creatinine, Ser: 5.7 mg/dL — ABNORMAL HIGH (ref 0.44–1.00)
GFR, Estimated: 8 mL/min — ABNORMAL LOW (ref >60)
Glucose, Bld: 90 mg/dL (ref 70–99)
Phosphorus: 6.7 mg/dL — ABNORMAL HIGH (ref 2.5–4.6)
Potassium: 5.4 mmol/L — ABNORMAL HIGH (ref 3.5–5.1)
Sodium: 140 mmol/L (ref 135–145)

## 2020-01-03 LAB — GLUCOSE, CAPILLARY
Glucose-Capillary: 81 mg/dL (ref 70–99)
Glucose-Capillary: 73 mg/dL (ref 70–99)
Glucose-Capillary: 84 mg/dL (ref 70–99)

## 2020-01-03 LAB — MRSA PCR SCREENING: MRSA by PCR: NEGATIVE

## 2020-01-03 LAB — CBG MONITORING, ED: Glucose-Capillary: 86 mg/dL (ref 70–99)

## 2020-01-03 MED ORDER — CHLORHEXIDINE GLUCONATE 0.12 % MT SOLN
15.0000 mL | Freq: Two times a day (BID) | OROMUCOSAL | Status: DC
  Administered 2020-01-03 – 2020-01-07 (×9): 15 mL via OROMUCOSAL
  Filled 2020-01-03 (×7): qty 15

## 2020-01-03 MED ORDER — ALBUTEROL SULFATE (2.5 MG/3ML) 0.083% IN NEBU: 2.5000 mg | INHALATION_SOLUTION | RESPIRATORY_TRACT | Status: DC | PRN

## 2020-01-03 MED ORDER — CALCIUM GLUCONATE-NACL 2-0.675 GM/100ML-% IV SOLN: 2.0000 g | Freq: Once | INTRAVENOUS | Status: DC

## 2020-01-03 MED ORDER — CALCIUM GLUCONATE-NACL 1-0.675 GM/50ML-% IV SOLN
1.0000 g | INTRAVENOUS | Status: AC
  Administered 2020-01-03 (×2): 1000 mg via INTRAVENOUS
  Filled 2020-01-03 (×2): qty 50

## 2020-01-03 MED ORDER — CHLORHEXIDINE GLUCONATE CLOTH 2 % EX PADS
6.0000 | MEDICATED_PAD | Freq: Every day | CUTANEOUS | Status: DC
  Administered 2020-01-03 – 2020-01-07 (×4): 6 via TOPICAL

## 2020-01-03 MED ORDER — ORAL CARE MOUTH RINSE
15.0000 mL | Freq: Two times a day (BID) | OROMUCOSAL | Status: DC
  Administered 2020-01-03 – 2020-01-07 (×9): 15 mL via OROMUCOSAL

## 2020-01-03 NOTE — ED Notes (Signed)
CRITICAL VALUE ALERT  Critical Value:  Calcium 6.2   Date & Time Notied:  01/02/2020& 0735  Provider Notified: Dr. Irene Limbo  Orders Received/Actions taken: notified

## 2020-01-03 NOTE — ED Notes (Signed)
Pt bed linens changed due to bowel incontinence. Pt given new warm blanket.

## 2020-01-03 NOTE — Progress Notes (Signed)
Pt attempting to get out of bed, requesting water. Alert but confused, following commands. Seems more alert than previous assessment. Re-attempted water challenge due to improved mentation. Failed again. Attempted applesauce again - no s/s aspiration with the applesauce. Speech notified.

## 2020-01-03 NOTE — Progress Notes (Signed)
Attempted to give patient applesauce. Pt sitting up in bed, awake, following commands, oriented to self only. Pt took bite of applesauce and proceeded to hold it in her mouth. When coached to chin tuck and swallow, she spit it out. Speech consulted.

## 2020-01-03 NOTE — Progress Notes (Signed)
PROGRESS NOTE  [name] [name] QIW:[id number] DOB: [birth year] DOA: 01/01/2020 PCP: Terrial Rhodes, MD  Brief History   53yow PMH strok, DM type 2, resides in group home presented w/ generalzied weakness, foun dto generally weak, hypotensive. Sepsis workup initiated. Labs revealed lactic acidosis, metabolic acidosis, acute renal failure. Admitted for eval and tx of acute renal failure and acidosis. Sepsis considered on admission but no source.    A & P  AKI on CKD stage IIIb w/ metabolic acidosis and hyperkalemia complicated by lisinopril and metformin --Suspect ATN secondary to sepsis secondary to UTI.  Complicated by Metformin and ACE inhibitor as an outpatient.  Renal ultrasound no hydronephrosis.  --Creatinine improving.  Potassium trending down.  Lokelma x1.  Continue bicarbonate infusion as per nephrology.   Sepsis secondary to UTI. BC pending, CXR NAD --Clinically improved.  Narrowed to monotherapy antibiotic.  Hypoglycemia, DM type 2 --Resolved.  Secondary to poor oral intake. Follow.  Dysphagia --Make n.p.o.  Speech therapy consultation.  Hypocalcemia --Replete, BMP in AM  Anemia of CKD --stable.  Disposition Plan:  Discussion: Remains acutely ill with acute AKI, hyperkalemia and metabolic acidosis.  Status is: Inpatient  Remains inpatient appropriate because:IV treatments appropriate due to intensity of illness or inability to take PO and Inpatient level of care appropriate due to severity of illness   Dispo: The patient is from: Group home              Anticipated d/c is to: Group home              Anticipated d/c date is: 3 days              Patient currently is not medically stable to d/c.  DVT prophylaxis: heparin injection 5,000 Units Start: 01/01/20 2330 SCDs Start: 01/01/20 2315   Code Status: Full Code Family Communication: Guardian [phone], no answer  Brendia Sacks, MD  [facility] Direct contact: see [email or web address] (further  directions at bottom of note if needed) 7PM-7AM contact night coverage as at bottom of note 01/03/2020, 3:38 PM  LOS: 2 days   Significant Hospital Events   .    Consults:  . Nephrology    Procedures:  .   Significant Diagnostic Tests:  . CXR NAD . AXR nonspecific prominent SB loops no obstruction . Renal u/s no hydro   Micro Data:  .    Antimicrobials:  .   Interval History/Subjective  CC: f/u AKI  Feels fine no complaints  Objective   Vitals:  Vitals:   01/03/20 1400 01/03/20 1500  BP: 130/71 (!) 129/106  Pulse: (!) 101 (!) 108  Resp: 11 (!) 25  Temp:    SpO2: 98% 98%    Exam:  Constitutional:   . Appears calm and comfortable ENMT:  . grossly normal hearing  . Lips appear normal Respiratory:  . CTA bilaterally, no w/r/r.  . Respiratory effort normal.  Cardiovascular:  . RRR, no m/r/g . No change in bilateral pedal edema  Abdomen:  . soft Psychiatric:  . Mental status o Mood, affect appropriate  I have personally reviewed the following:   Today's Data  . UOP unrecorded . K+ 6.6 > 6.0 > 5.4 . Creatinine down to 5.7, CO2 up an dAG down . procalcitonin .33 . Hgb 9.8  Scheduled Meds: . amLODipine  10 mg Oral q morning - 10a  . aspirin EC  81 mg Oral Daily  . atorvastatin  40 mg Oral Q2000  . busPIRone  10 mg  Oral TID  . chlorhexidine  15 mL Mouth Rinse BID  . Chlorhexidine Gluconate Cloth  6 each Topical Daily  . clopidogrel  75 mg Oral Q breakfast  . ferrous sulfate  325 mg Oral Q breakfast  . folic acid  1 mg Oral Daily  . heparin  5,000 Units Subcutaneous Q8H  . mouth rinse  15 mL Mouth Rinse q12n4p  . megestrol  40 mg Oral Daily  . mometasone-formoterol  2 puff Inhalation BID  . nystatin cream   Topical BID  . traZODone  100 mg Oral QHS  . vancomycin variable dose per unstable renal function (pharmacist dosing)   Does not apply See admin instructions  . vitamin B-12  1,000 mcg Oral Daily  . Vitamin D3  1,000 Units Oral Daily    Continuous Infusions: . ceFEPime (MAXIPIME) IV Stopped (01/02/20 2244)  .  sodium bicarbonate (isotonic) infusion in sterile water 125 mL/hr at 01/03/20 1500    Principal Problem:   AKI (acute kidney injury) (HCC) Active Problems:   Sepsis (HCC)   Type 2 diabetes mellitus with hypoglycemia without coma (HCC)   Pressure injury of skin   Dysphagia   Hypocalcemia   Anemia in chronic kidney disease (CKD)   LOS: 2 days   How to contact the [facility] Attending or Consulting provider 7A - 7P or covering provider during after hours 7P -7A, for this patient?  1. Check the care team in [facility] and look for a) attending/consulting [facility] provider listed and b) the [facility] team listed 2. Log into [email or web address] and use [place]'s universal password to access. If you do not have the password, please contact the hospital operator. 3. Locate the [facility] provider you are looking for under [facility] and page to a number that you can be directly reached. 4. If you still have difficulty reaching the provider, please page the [facility] (Director on Call) for the Hospitalists listed on amion for assistance.

## 2020-01-03 NOTE — Progress Notes (Signed)
[facility] Progress Note   Subjective:   No complaints, unable to provide much history.  Endorses nausea.  Tells me she lives alone but appears she lives in a group home  Objective Vitals:   01/03/20 0900 01/03/20 0905 01/03/20 1000 01/03/20 1100  BP: 92/67 92/67 (!) 135/108 118/67  Pulse: 89  96 98  Resp: 17  19 14   Temp: 97.6 F (36.4 C)     TempSrc: Oral     SpO2: 100%  100% 92%  Weight:      Height:       Physical Exam General: frail, cachetic, weak Heart: RRR, no rub Lungs: dec BS bases, normal WOB, no rales Abdomen: soft, nontender Extremities: 1+ edema, mild ankle erythema and some skin scaling Neuro: conversant but slow to respond  Additional Objective Labs: Basic Metabolic Panel: Recent Labs  Lab 01/02/20 1059 01/02/20 1903 01/03/20 0502  NA 140 139 140  K 6.0* 5.6* 5.4*  CL 107 107 106  CO2 13* 12* 16*  GLUCOSE 78 102* 90  BUN 86* 86* 87*  CREATININE 6.18* 6.01* 5.70*  CALCIUM 6.4* 6.0* 6.2*  PHOS 8.1*  --  6.7*   Liver Function Tests: Recent Labs  Lab 01/01/20 1830 01/01/20 1830 01/02/20 0117 01/02/20 1059 01/03/20 0502  AST 103*  --  104*  --   --   ALT 40  --  40  --   --   ALKPHOS 53  --  46  --   --   BILITOT 0.5  --  0.5  --   --   PROT 7.5  --  6.6  --   --   ALBUMIN 3.0*   < > 2.6* 2.4* 2.3*   < > = values in this interval not displayed.   Recent Labs  Lab 01/02/20 1059  LIPASE 15   CBC: Recent Labs  Lab 01/01/20 1830 01/03/20 0502  WBC 15.6* 11.5*  NEUTROABS 14.0*  --   HGB 9.8* 9.8*  HCT 32.0* 30.2*  MCV 99.4 95.3  PLT 420* 357   Blood Culture    Component Value Date/Time   SDES  01/01/2020 1855    IN/OUT CATH URINE Performed at [facility], [address] [place], [place]    [facility]  01/01/2020 1855    NONE Performed at [facility], [address] [place], [place]    CULT (A) 01/01/2020 1855    >=100,000 COLONIES/mL ESCHERICHIA COLI CULTURE REINCUBATED FOR BETTER  GROWTH SUSCEPTIBILITIES TO FOLLOW Performed at [facility], [address] [place], [place]    REPTSTATUS PENDING 01/01/2020 1855    Cardiac Enzymes: No results for input(s): CKTOTAL, CKMB, CKMBINDEX, TROPONINI in the last 168 hours. CBG: Recent Labs  Lab 01/02/20 1655 01/02/20 1911 01/02/20 1935 01/03/20 0808 01/03/20 1153  GLUCAP 100* 121* 91 86 81   Iron Studies: No results for input(s): IRON, TIBC, TRANSFERRIN, FERRITIN in the last 72 hours. @lablastinr3 @ Studies/Results: CT Head Wo Contrast  Result Date: 01/01/2020 CLINICAL DATA:  Altered level of consciousness, history of falls EXAM: CT HEAD WITHOUT CONTRAST TECHNIQUE: Contiguous axial images were obtained from the base of the skull through the vertex without intravenous contrast. COMPARISON:  10/20/2019 FINDINGS: Brain: Hypodensities of the bilateral basal ganglia and periventricular white matter are stable, consistent with chronic small vessel ischemic changes. No signs of acute infarct or hemorrhage. The lateral ventricles and remaining midline structures are unremarkable. No acute extra-axial fluid collections. No mass  effect. Vascular: No hyperdense vessel or unexpected calcification. Skull: Normal. Negative for fracture or focal lesion. Sinuses/Orbits: No acute finding. Other: None. IMPRESSION: 1. Chronic small vessel ischemic changes. No acute intracranial process. Electronically Signed   By: Sharlet Salina M.D.   On: 01/01/2020 16:41   US RENAL  Result Date: 01/02/2020 CLINICAL DATA:  Acute kidney injury EXAM: RENAL / URINARY TRACT ULTRASOUND COMPLETE COMPARISON:  10/21/2019 FINDINGS: Right Kidney: Renal measurements: 11.2 x 5.2 x 5.7 cm = volume: 173 mL. Prominent renal cortical echogenicity. No hydronephrosis or mass. Subcentimeter simple cyst along the upper renal cortex. Left Kidney: Renal measurements: 10.4 x 5.5 x 4.8 cm = volume: 142 mL. Echogenicity within normal limits. No mass or hydronephrosis  visualized. Bladder: Appears normal for degree of bladder distention. Other: Narrow sonographic windows the left kidney and bladder. IMPRESSION: 1. Stable from scan 2 weeks ago. 2. Medical renal disease without hydronephrosis or asymmetry. Electronically Signed   By: Marnee Spring M.D.   On: 01/02/2020 10:58   DG Chest Port 1 View  Result Date: 01/01/2020 CLINICAL DATA:  Sepsis, weakness EXAM: PORTABLE CHEST 1 VIEW COMPARISON:  09/07/2018 FINDINGS: The heart size and mediastinal contours are within normal limits. Both lungs are clear. The visualized skeletal structures are unremarkable. IMPRESSION: No active disease. Electronically Signed   By: Sharlet Salina M.D.   On: 01/01/2020 16:17   DG Abd Portable 1V  Result Date: 01/02/2020 CLINICAL DATA:  Abdominal pain. EXAM: PORTABLE ABDOMEN - 1 VIEW COMPARISON:  02/03/2006 abdominal radiographs. FINDINGS: Gas is seen within diffusely prominent small bowel loops. No pneumoperitoneum. Mild stool burden. No suspicious calcific densities. Mild lumbar and bilateral hip degenerative changes. No acute osseous abnormality. IMPRESSION: Diffusely prominent proximal small bowel loops. Mild stool burden. Electronically Signed   By: Stana Bunting M.D.   On: 01/02/2020 12:46   Medications: . ceFEPime (MAXIPIME) IV Stopped (01/02/20 2244)  .  sodium bicarbonate (isotonic) infusion in sterile water 125 mL/hr at 01/03/20 1030   . amLODipine  10 mg Oral q morning - 10a  . aspirin EC  81 mg Oral Daily  . atorvastatin  40 mg Oral Q2000  . busPIRone  10 mg Oral TID  . chlorhexidine  15 mL Mouth Rinse BID  . Chlorhexidine Gluconate Cloth  6 each Topical Daily  . clopidogrel  75 mg Oral Q breakfast  . ferrous sulfate  325 mg Oral Q breakfast  . folic acid  1 mg Oral Daily  . heparin  5,000 Units Subcutaneous Q8H  . insulin aspart  0-9 Units Subcutaneous TID WC  . mouth rinse  15 mL Mouth Rinse q12n4p  . megestrol  40 mg Oral Daily  . mometasone-formoterol   2 puff Inhalation BID  . nystatin cream   Topical BID  . traZODone  100 mg Oral QHS  . vancomycin variable dose per unstable renal function (pharmacist dosing)   Does not apply See admin instructions  . vitamin B-12  1,000 mcg Oral Daily  . Vitamin D3  1,000 Units Oral Daily    Assessment/Plan: 1.  AKI/CKD stage IIIb- presumably due to ischemic ATN in setting of sepsis/hypovolemia and concomitant ACE inhibition.  BUN/Cr and K improving slowly with IVF's. Renal US without obstruction.   1. Continue to hold ACE and metformin 2. Continue with IVF's with isotonic bicarb, low threshhold to d/c if s/s volume overload 3. No indication for dialysis at this time.  Attempted conversation re: RRT with her today; looks to be  a very poor candidate.  She said "I don't know" when we discussed today regarding consent if need arose.  Cont to address as dictated by clinical course.  2. Hyperkalemia- improving.  lokelma 5g now.  Trend.  3. Metabolic acidosis- due to #1.  Improving with IV bicarb gtt.  4. Sepsis secondary to E coli UTI - on appropriate antibiotics.  MAPs ok without pressors.  Clinically improving.  5. AMS- due to sepsis and hypotension.  CT scan negative for acute changes.  She is able to answer simple questions now. 6. DM- per primary. 7. Bilateral pedal edema- possible cellulitis of left foot.  Denies any tenderness.  Estill Bakes MD 01/03/2020, 12:10 PM   [facility] Pager: [phone]

## 2020-01-04 DIAGNOSIS — N390 Urinary tract infection, site not specified: Secondary | ICD-10-CM

## 2020-01-04 LAB — BASIC METABOLIC PANEL
Anion gap: 12 (ref 5–15)
BUN: 72 mg/dL — ABNORMAL HIGH (ref 6–20)
CO2: 24 mmol/L (ref 22–32)
Calcium: 5.7 mg/dL — CL (ref 8.9–10.3)
Chloride: 106 mmol/L (ref 98–111)
Creatinine, Ser: 4.23 mg/dL — ABNORMAL HIGH (ref 0.44–1.00)
GFR, Estimated: 12 mL/min — ABNORMAL LOW (ref >60)
Glucose, Bld: 81 mg/dL (ref 70–99)
Potassium: 4.1 mmol/L (ref 3.5–5.1)
Sodium: 142 mmol/L (ref 135–145)

## 2020-01-04 LAB — URINE CULTURE: Culture: >=100000 — AB

## 2020-01-04 LAB — PROCALCITONIN: Procalcitonin: 0.26 ng/mL

## 2020-01-04 MED ORDER — SODIUM CHLORIDE 0.9 % IV BOLUS
500.0000 mL | Freq: Once | INTRAVENOUS | Status: AC
  Administered 2020-01-04: 500 mL via INTRAVENOUS

## 2020-01-04 MED ORDER — CEPHALEXIN 500 MG PO CAPS
500.0000 mg | ORAL_CAPSULE | Freq: Two times a day (BID) | ORAL | Status: DC
  Administered 2020-01-04 – 2020-01-07 (×6): 500 mg via ORAL
  Filled 2020-01-04 (×12): qty 1

## 2020-01-04 MED ORDER — AMLODIPINE BESYLATE 5 MG PO TABS
5.0000 mg | ORAL_TABLET | Freq: Every morning | ORAL | Status: DC
  Administered 2020-01-05 – 2020-01-07 (×3): 5 mg via ORAL
  Filled 2020-01-04 (×3): qty 1

## 2020-01-04 MED ORDER — CALCIUM GLUCONATE-NACL 1-0.675 GM/50ML-% IV SOLN
1.0000 g | INTRAVENOUS | Status: AC
  Administered 2020-01-04 (×2): 1000 mg via INTRAVENOUS
  Filled 2020-01-04 (×2): qty 50

## 2020-01-04 MED ORDER — ASPIRIN 81 MG PO CHEW
81.0000 mg | CHEWABLE_TABLET | Freq: Every day | ORAL | Status: DC
  Administered 2020-01-04 – 2020-01-07 (×4): 81 mg via ORAL
  Filled 2020-01-04 (×4): qty 1

## 2020-01-04 MED ORDER — SODIUM BICARBONATE 650 MG PO TABS
650.0000 mg | ORAL_TABLET | Freq: Three times a day (TID) | ORAL | Status: DC
  Administered 2020-01-04 – 2020-01-07 (×9): 650 mg via ORAL
  Filled 2020-01-04 (×9): qty 1

## 2020-01-04 MED ORDER — SODIUM CHLORIDE 0.9 % IV SOLN: INTRAVENOUS | Status: AC

## 2020-01-04 NOTE — Progress Notes (Signed)
Report called, patient transferred to ICU

## 2020-01-04 NOTE — Progress Notes (Signed)
[facility] Progress Note   Subjective:   No complaints, unable to provide much history - says she ate and when I ask what she ate she says 'nothing'.  I/Os yesterday 1L / 1L all UOP, so far UOP today.    Objective Vitals:   01/04/20 0800 01/04/20 0900 01/04/20 1000 01/04/20 1100  BP: (!) 101/57 (!) 87/58 111/75 116/72  Pulse: 100 (!) 103 99 95  Resp: 14 11 15 12   Temp:      TempSrc:      SpO2: 99% 100% 100% 100%  Weight:      Height:       Physical Exam General: frail, cachetic, weak Heart: RRR, no rub Lungs: dec BS bases, normal WOB, no rales Abdomen: soft, nontender Extremities: 1+ edema, mild ankle erythema and some skin scaling Neuro: conversant but slow to respond  Additional Objective Labs: Basic Metabolic Panel: Recent Labs  Lab 01/02/20 1059 01/02/20 1059 01/02/20 1903 01/03/20 0502 01/04/20 0538  NA 140   < > 139 140 142  K 6.0*   < > 5.6* 5.4* 4.1  CL 107   < > 107 106 106  CO2 13*   < > 12* 16* 24  GLUCOSE 78   < > 102* 90 81  BUN 86*   < > 86* 87* 72*  CREATININE 6.18*   < > 6.01* 5.70* 4.23*  CALCIUM 6.4*   < > 6.0* 6.2* 5.7*  PHOS 8.1*  --   --  6.7*  --    < > = values in this interval not displayed.   Liver Function Tests: Recent Labs  Lab 01/01/20 1830 01/01/20 1830 01/02/20 0117 01/02/20 1059 01/03/20 0502  AST 103*  --  104*  --   --   ALT 40  --  40  --   --   ALKPHOS 53  --  46  --   --   BILITOT 0.5  --  0.5  --   --   PROT 7.5  --  6.6  --   --   ALBUMIN 3.0*   < > 2.6* 2.4* 2.3*   < > = values in this interval not displayed.   Recent Labs  Lab 01/02/20 1059  LIPASE 15   CBC: Recent Labs  Lab 01/01/20 1830 01/03/20 0502  WBC 15.6* 11.5*  NEUTROABS 14.0*  --   HGB 9.8* 9.8*  HCT 32.0* 30.2*  MCV 99.4 95.3  PLT 420* 357   Blood Culture    Component Value Date/Time   SDES  01/01/2020 1855    IN/OUT CATH URINE Performed at [facility], [address], [place], [place]     [facility]  01/01/2020 1855    NONE Performed at [facility], [address] [place], [place]    CULT >=100,000 COLONIES/mL ESCHERICHIA COLI (A) 01/01/2020 1855   REPTSTATUS 01/04/2020 FINAL 01/01/2020 1855    Cardiac Enzymes: No results for input(s): CKTOTAL, CKMB, CKMBINDEX, TROPONINI in the last 168 hours. CBG: Recent Labs  Lab 01/02/20 1935 01/03/20 0808 01/03/20 1153 01/03/20 1605 01/03/20 2315  GLUCAP 91 86 81 73 84   Iron Studies: No results for input(s): IRON, TIBC, TRANSFERRIN, FERRITIN in the last 72 hours. @lablastinr3 @ Studies/Results: DG Abd Portable 1V  Result Date: 01/02/2020 CLINICAL DATA:  Abdominal pain. EXAM: PORTABLE ABDOMEN - 1 VIEW COMPARISON:  02/03/2006 abdominal radiographs. FINDINGS: Gas is seen within diffusely prominent small bowel loops. No pneumoperitoneum. Mild stool burden. No  suspicious calcific densities. Mild lumbar and bilateral hip degenerative changes. No acute osseous abnormality. IMPRESSION: Diffusely prominent proximal small bowel loops. Mild stool burden. Electronically Signed   By: Stana Bunting M.D.   On: 01/02/2020 12:46   Medications: .  sodium bicarbonate (isotonic) infusion in sterile water 125 mL/hr at 01/04/20 1000   . amLODipine  10 mg Oral q morning - 10a  . aspirin  81 mg Oral Daily  . atorvastatin  40 mg Oral Q2000  . busPIRone  10 mg Oral TID  . cephALEXin  500 mg Oral Q12H  . chlorhexidine  15 mL Mouth Rinse BID  . Chlorhexidine Gluconate Cloth  6 each Topical Daily  . clopidogrel  75 mg Oral Q breakfast  . ferrous sulfate  325 mg Oral Q breakfast  . folic acid  1 mg Oral Daily  . heparin  5,000 Units Subcutaneous Q8H  . mouth rinse  15 mL Mouth Rinse q12n4p  . megestrol  40 mg Oral Daily  . mometasone-formoterol  2 puff Inhalation BID  . nystatin cream   Topical BID  . traZODone  100 mg Oral QHS  . vancomycin variable dose per unstable renal function (pharmacist dosing)   Does not apply See admin  instructions  . vitamin B-12  1,000 mcg Oral Daily  . Vitamin D3  1,000 Units Oral Daily    Assessment/Plan: 1.  AKI/CKD stage IIIb- presumably due to ischemic ATN in setting of sepsis/hypovolemia and concomitant ACE inhibition.  BUN/Cr and K improving slowly with IVF's. Renal US without obstruction.   1. Continue to hold ACE and metformin 2. Continue with IVF's with isotonic bicarb which we can switch to NS today (looks like po intake is poor so we will continue), low threshhold to d/c if s/s volume overload 3. No indication for dialysis at this time.  Attempted conversation re: RRT with her yesterday; looks to be a very poor candidate.  She said "I don't know" when we discussed today regarding consent if need arose.  Cont to address as dictated by clinical course which thankfully isn't needed since she's improving. 2. Hyperkalemia- improved after lokelma 5g yesterday.  3. Metabolic acidosis- due to #1.  Improved with IV bicarb gtt. Switch to NS and po bicarb. 4. Sepsis secondary to E coli UTI - on appropriate antibiotics.  MAPs ok without pressors.  Clinically improving.  5. AMS- due to sepsis and hypotension.  CT scan negative for acute changes.  She is able to answer simple questions now. 6. DM- per primary. 7. Bilateral pedal edema- possible cellulitis of left foot.  Denies any tenderness. 8. HTN - decreased amlodipine to 5 mg daily due to hypotension today, holding ACEi given AKI. 9. Hypocalcemia - being repleted.  Estill Bakes MD 01/04/2020, 12:03 PM  [facility] Pager: [phone]

## 2020-01-04 NOTE — Progress Notes (Addendum)
PROGRESS NOTE  [name] [name] PHX:[id number] DOB: 1965-06-09 DOA: 01/01/2020 PCP: Terrial Rhodes, MD  Brief History   53yow PMH strok, DM type 2, resides in group home presented w/ generalzied weakness, found to generally weak, hypotensive. Sepsis workup initiated. Labs revealed lactic acidosis, metabolic acidosis, acute renal failure. Admitted for eval and tx of acute renal failure and acidosis. Seen by nephrology. Renal fxn improving w/ IVF, hyperkalemia and acidosis resolved. Calcium remains low and will replete. Continue IVF to see further improvement prior to release.   A & P  AKI on CKD stage IIIb w/ metabolic acidosis and hyperkalemia complicated by lisinopril and metformin --secondary to ATNy to sepsis secondary to UTI.  Complicated by Metformin and ACE inhibitor as an outpatient.  Renal ultrasound no hydronephrosis.  --Creatinine continues to improve and K+ and CO2 now normal. Acidosis resolved. No evidence of significant volume overload. --continue management per nephrology.  Sepsis secondary to UTI. BC pending, CXR NAD --Clinically resolved.  Narrowed to monotherapy antibiotic > to oral today.  Hypoglycemia, DM type 2 --Resolved.  Secondary to poor oral intake. Continue to follow.  Dysphagia unspecified R13.10 --discussed w/ A. Myer Haff and agree w/ Dysphagia 1, nectar-thick liquids diet  Hypocalcemia, severe but asymptomatic. --Replete aggresively, BMP in AM  Anemia of CKD --stable.  Disposition Plan:  Discussion: Remains acutely ill with acute AKI but showing some improvement. Can transfer to telemetry.  Status is: Inpatient  Remains inpatient appropriate because:IV treatments appropriate due to intensity of illness or inability to take PO and Inpatient level of care appropriate due to severity of illness   Dispo: The patient is from: Group home              Anticipated d/c is to: Group home              Anticipated d/c date is: 3 days               Patient currently is not medically stable to d/c.  DVT prophylaxis: heparin injection 5,000 Units Start: 01/01/20 2330 SCDs Start: 01/01/20 2315   Code Status: Full Code Family Communication: Guardian [phone], no answer yesterday will attempt again today  Brendia Sacks, MD  [facility] Direct contact: see [email or web address] (further directions at bottom of note if needed) 7PM-7AM contact night coverage as at bottom of note 01/04/2020, 9:34 AM  LOS: 3 days   Significant Hospital Events   .    Consults:  . Nephrology    Procedures:  .   Significant Diagnostic Tests:  . CXR NAD . AXR nonspecific prominent SB loops no obstruction . Renal u/s no hydro   Micro Data:  . UC E coli   Antimicrobials:  . Keflex  Interval History/Subjective  CC: f/u AKI  Intermittently sleepy No issues noted overnight but yesteday afternoon was attempting to get out of bed and was confused.  Objective   Vitals:  Vitals:   01/04/20 0700 01/04/20 0800  BP: 106/67 (!) 101/57  Pulse: (!) 133 100  Resp: 18 14  Temp:    SpO2: 99% 99%    Exam:  Constitutional:   . Appears calm and comfortable Eyes:  . pupils and irises appear normal . Normal lids and conjunctivae ENMT:  . grossly normal hearing  Respiratory:  . CTA bilaterally, no w/r/r.  . Respiratory effort normal.  Cardiovascular:  . RRR, no m/r/g . No change in mild bilateral pedal edema   Skin:  . No rashes, lesions, ulcers Psychiatric:  .  Mental status . Sleeping currently  I have personally reviewed the following:   Today's Data  . UOP 1000, I/O not accurate w/ unrecorded voids . CBG stable . K+ 6.6 > 6.0 > 5.4 > 4.1 . Creatinine 5.7 > 4.23  . AG WNL today . CO2 now 24  Scheduled Meds: . amLODipine  10 mg Oral q morning - 10a  . aspirin EC  81 mg Oral Daily  . atorvastatin  40 mg Oral Q2000  . busPIRone  10 mg Oral TID  . cephALEXin  500 mg Oral Q12H  . chlorhexidine  15 mL Mouth Rinse BID  .  Chlorhexidine Gluconate Cloth  6 each Topical Daily  . clopidogrel  75 mg Oral Q breakfast  . ferrous sulfate  325 mg Oral Q breakfast  . folic acid  1 mg Oral Daily  . heparin  5,000 Units Subcutaneous Q8H  . mouth rinse  15 mL Mouth Rinse q12n4p  . megestrol  40 mg Oral Daily  . mometasone-formoterol  2 puff Inhalation BID  . nystatin cream   Topical BID  . traZODone  100 mg Oral QHS  . vancomycin variable dose per unstable renal function (pharmacist dosing)   Does not apply See admin instructions  . vitamin B-12  1,000 mcg Oral Daily  . Vitamin D3  1,000 Units Oral Daily   Continuous Infusions: . calcium gluconate    .  sodium bicarbonate (isotonic) infusion in sterile water 125 mL/hr at 01/04/20 0705    Principal Problem:   AKI (acute kidney injury) (HCC) Active Problems:   Sepsis (HCC)   Type 2 diabetes mellitus with hypoglycemia without coma (HCC)   Pressure injury of skin   Dysphagia   Hypocalcemia   Anemia in chronic kidney disease (CKD)   LOS: 3 days   How to contact the [facility] Attending or Consulting provider 7A - 7P or covering provider during after hours 7P -7A, for this patient?  1. Check the care team in [facility] and look for a) attending/consulting TRH provider listed and b) the [facility] team listed 2. Log into [email or web address] and use 's universal password to access. If you do not have the password, please contact the hospital operator. 3. Locate the [facility] provider you are looking for under [facility] and page to a number that you can be directly reached. 4. If you still have difficulty reaching the provider, please page the [facility] (Director on Call) for the Hospitalists listed on amion for assistance.

## 2020-01-04 NOTE — Plan of Care (Signed)
  Problem: SLP Dysphagia Goals Goal: Patient will utilize recommended strategies Description: Patient will utilize recommended strategies during swallow to increase swallowing safety with recommended diet Flowsheets (Taken 01/04/2020 0807) Patient will utilize recommended strategies during swallow to increase swallowing safety with: max assist   Thank you, Yancy Knoble H. Romie Levee, CCC-SLP Speech Language Pathologist

## 2020-01-04 NOTE — Progress Notes (Signed)
   01/04/20 1903  Assess: MEWS Score  Temp (!) 100.9 F (38.3 C)  BP 91/62  Pulse Rate (!) 131  Resp 18  Level of Consciousness Alert  SpO2 99 %  O2 Device Room Air  Assess: MEWS Score  MEWS Temp 1  MEWS Systolic 1  MEWS Pulse 3  MEWS RR 0  MEWS LOC 0  MEWS Score 5  MEWS Score Color Red  Assess: if the MEWS score is Yellow or Red  Were vital signs taken at a resting state? Yes  Focused Assessment No change from prior assessment  Early Detection of Sepsis Score *See Row Information* Medium  MEWS guidelines implemented *See Row Information* Yes  Treat  MEWS Interventions Escalated (See documentation below)  Take Vital Signs  Increase Vital Sign Frequency  Red: Q 1hr X 4 then Q 4hr X 4, if remains red, continue Q 4hrs  Escalate  MEWS: Escalate Red: discuss with charge nurse/RN and provider, consider discussing with RRT  Notify: Charge Nurse/RN  Name of Charge Nurse/RN Notified [name] RN  Date Charge Nurse/RN Notified 01/04/20  Time Charge Nurse/RN Notified 1903  Notify: Provider  Provider Name/Title Dr. Michaele Offer NP  Date Provider Notified 01/04/20  Time Provider Notified 1905  Notification Type Page  Notification Reason Other (Comment) (MEWS 5)  Response See new orders (Transfer back to ICU)  Date of Provider Response 01/04/20  Time of Provider Response 1930  Notify: Rapid Response  Name of Rapid Response RN Notified Rudie Meyer RN  Document  Patient Outcome Transferred/level of care increased  Progress note created (see row info) Yes

## 2020-01-04 NOTE — Evaluation (Signed)
Clinical/Bedside Swallow Evaluation Patient Details  Name: [name] [name] MRN: [id number] Date of Birth: 12-11-1965  Today's Date: 01/04/2020 Time: SLP Start Time (ACUTE ONLY): 0734 SLP Stop Time (ACUTE ONLY): 0759 SLP Time Calculation (min) (ACUTE ONLY): 25 min  Past Medical History:  Past Medical History:  Diagnosis Date  . Acute cystitis   . Anxiety   . Arthritis   . CVA (cerebral vascular accident) (HCC)   . Depression   . Diabetes mellitus without complication (HCC)   . Dyspnea   . Fecal incontinence   . Hypertension   . Incontinence   . Incontinence of urine   . Stroke (HCC) 2004   x 2  . Urinary incontinence    Past Surgical History:  Past Surgical History:  Procedure Laterality Date  . COLONOSCOPY WITH PROPOFOL N/A 11/27/2018   Procedure: COLONOSCOPY WITH PROPOFOL;  Surgeon: Toledo, Boykin Nearing, MD;  Location: [facility] ENDOSCOPY;  Service: Gastroenterology;  Laterality: N/A;  . ESOPHAGOGASTRODUODENOSCOPY (EGD) WITH PROPOFOL N/A 11/27/2018   Procedure: ESOPHAGOGASTRODUODENOSCOPY (EGD) WITH PROPOFOL;  Surgeon: Toledo, Boykin Nearing, MD;  Location: [facility] ENDOSCOPY;  Service: Gastroenterology;  Laterality: N/A;  . NO PAST SURGERIES    . TOTAL KNEE ARTHROPLASTY Right 10/03/2017   Procedure: TOTAL KNEE ARTHROPLASTY;  Surgeon: Lyndle Herrlich, MD;  Location: [facility] ORS;  Service: Orthopedics;  Laterality: Right;   HPI:  53yow PMH strok, DM type 2, resides in group home presented w/ generalzied weakness, foun dto generally weak, hypotensive. Sepsis workup initiated. Labs revealed lactic acidosis, metabolic acidosis, acute renal failure. Admitted for eval and tx of acute renal failure and acidosis. Sepsis considered on admission but no source.    Assessment / Plan / Recommendation Clinical Impression  Clinical bedside swallow complete while Pt was sitting upright in bed; Pt was roused with max verbal cues and repositioning and remained alert for a brief period and limited trials. PT  demonstrated immediate coughing with thin liquid trials; no coughing was noted with NTL. Pt consumed a very limited amount of puree textures without incident. Recommend initiate D1/puree diet and NTL, however, Pt should only be provided PO when she is alert, responsive and sitting upright. ST will continue to follow acutely. SLP Visit Diagnosis: Dysphagia, unspecified (R13.10)    Aspiration Risk  Mild aspiration risk;Moderate aspiration risk    Diet Recommendation Dysphagia 1 (Puree);Nectar-thick liquid   Liquid Administration via: Cup Medication Administration: Crushed with puree Supervision: Full supervision/cueing for compensatory strategies;Staff to assist with self feeding Compensations: Minimize environmental distractions;Slow rate;Small sips/bites Postural Changes: Seated upright at 90 degrees;Remain upright for at least 30 minutes after po intake    Other  Recommendations Oral Care Recommendations: Oral care BID   Follow up Recommendations Skilled Nursing facility;24 hour supervision/assistance      Frequency and Duration min 1 x/week  1 week       Prognosis Prognosis for Safe Diet Advancement: Fair      Swallow Study   General Date of Onset: 01/01/20 HPI: 53yow PMH strok, DM type 2, resides in group home presented w/ generalzied weakness, foun dto generally weak, hypotensive. Sepsis workup initiated. Labs revealed lactic acidosis, metabolic acidosis, acute renal failure. Admitted for eval and tx of acute renal failure and acidosis. Sepsis considered on admission but no source.  Type of Study: Bedside Swallow Evaluation Previous Swallow Assessment: BSE 2020 Diet Prior to this Study: NPO Temperature Spikes Noted: No Respiratory Status: Room air History of Recent Intubation: No Behavior/Cognition: Cooperative;Lethargic/Drowsy;Requires cueing Oral Cavity Assessment: Dry;Dried secretions  Oral Care Completed by SLP: Yes Oral Cavity - Dentition: Missing dentition;Poor  condition;Adequate natural dentition Vision: Functional for self-feeding Self-Feeding Abilities: Needs assist;Needs set up Patient Positioning: Upright in bed Baseline Vocal Quality: Not observed Volitional Cough: Cognitively unable to elicit Volitional Swallow: Unable to elicit    Oral/Motor/Sensory Function Overall Oral Motor/Sensory Function: Within functional limits   Ice Chips Ice chips: Within functional limits   Thin Liquid Thin Liquid: Impaired Presentation: Cup Oral Phase Impairments: Poor awareness of bolus;Reduced lingual movement/coordination Oral Phase Functional Implications: Right anterior spillage;Left anterior spillage Pharyngeal  Phase Impairments: Suspected delayed Swallow;Multiple swallows;Wet Vocal Quality;Throat Clearing - Delayed;Cough - Immediate    Nectar Thick Nectar Thick Liquid: Within functional limits   Honey Thick Honey Thick Liquid: Not tested   Puree Puree: Within functional limits   Solid     Solid: Not tested     Rindy Kollman H. Romie Levee, CCC-SLP Speech Language Pathologist  Georgetta Haber 01/04/2020,8:05 AM

## 2020-01-05 LAB — BASIC METABOLIC PANEL
Anion gap: 10 (ref 5–15)
BUN: 49 mg/dL — ABNORMAL HIGH (ref 6–20)
CO2: 26 mmol/L (ref 22–32)
Calcium: 6.6 mg/dL — ABNORMAL LOW (ref 8.9–10.3)
Chloride: 109 mmol/L (ref 98–111)
Creatinine, Ser: 2.79 mg/dL — ABNORMAL HIGH (ref 0.44–1.00)
GFR, Estimated: 20 mL/min — ABNORMAL LOW (ref >60)
Glucose, Bld: 115 mg/dL — ABNORMAL HIGH (ref 70–99)
Potassium: 3.5 mmol/L (ref 3.5–5.1)
Sodium: 145 mmol/L (ref 135–145)

## 2020-01-05 LAB — GLUCOSE, CAPILLARY
Glucose-Capillary: 131 mg/dL — ABNORMAL HIGH (ref 70–99)
Glucose-Capillary: 104 mg/dL — ABNORMAL HIGH (ref 70–99)
Glucose-Capillary: 87 mg/dL (ref 70–99)

## 2020-01-05 MED ORDER — CALCIUM GLUCONATE-NACL 1-0.675 GM/50ML-% IV SOLN
1.0000 g | INTRAVENOUS | Status: AC
  Administered 2020-01-05 (×2): 1000 mg via INTRAVENOUS
  Filled 2020-01-05 (×2): qty 50

## 2020-01-05 NOTE — Consult Note (Signed)
WOC Nurse Consult Note: Patient receiving care in [facility] ICU7.  Consult completed with assistance of primary RN, Gypsy Decant via [employer]. Reason for Consult: "wounds present on admission" Wound type: Dry, scaling, cracking skin to bilateral feet, and abrasions from the patient constantly crossing her feet and rubbing them together.  For these areas I have ordered daily washing with soap and water, application of Sween moisturizing ointment and use of Prevalon boots.  There are 2 areas on the left buttock, the smaller one is yellow with a red center and roughly the size of a dime; the other larger one is yellow and roughly the size of a quarter.  For these I have ordered: Wash wounds on left buttock with soap and water. Pat dry. Apply Skin Barrier wipes Hart [place] [phone]) around the wound, allow to air dry. Then place a hydrocolloid dressing [place] [phone]) over the wound. Perform daily.   Pressure Injury POA: Yes Measurement: To be provided by the bedside RN in the flowsheet section Wound bed: as above Drainage (amount, consistency, odor) the smaller left buttock wound is friable and bleeding to touch. Periwound: intact Dressing procedure/placement/frequency: as above.  Monitor the wound area(s) for worsening of condition such as: Signs/symptoms of infection,  Increase in size,  Development of or worsening of odor, Development of pain, or increased pain at the affected locations.  Notify the medical team if any of these develop.  Thank you for the consult.  Discussed plan of care with the patient and bedside nurse.  WOC nurse will not follow at this time.  Please re-consult the WOC team if needed.  Helmut Muster, RN, MSN, CWOCN, CNS-BC, pager [phone]

## 2020-01-05 NOTE — Progress Notes (Signed)
  Speech Language Pathology Treatment: Dysphagia  Patient Details Name: [name] [name] MRN: [id number] DOB: 1965/04/23 Today's Date: 01/05/2020 Time: 4098-1191 SLP Time Calculation (min) (ACUTE ONLY): 21 min  Assessment / Plan / Recommendation Clinical Impression  Pt seen for ongoing dysphagia intervention following BSE completed yesterday. Pt more alert and communicative. She initially presented with facial grimace with swallows, however this diminished throughout the course of trials. Pt presented with ice chips, thin water via cup/straw sips, puree, mech soft, and regular textures. Pt with mildly prolonged oral phase with solids and resulting min lingual residue which clears with liquid wash. Pt with some delayed throat clearing and coughing when consuming regular textures and thin liquid wash. RN reports that Pt is drinking her NTL and seems to like them. Will advance to D3/mech soft today and try thin liquids again tomorrow. Above to RN.    HPI HPI: 53yow PMH strok, DM type 2, resides in group home presented w/ generalzied weakness, foun dto generally weak, hypotensive. Sepsis workup initiated. Labs revealed lactic acidosis, metabolic acidosis, acute renal failure. Admitted for eval and tx of acute renal failure and acidosis. Sepsis considered on admission but no source.       SLP Plan  Continue with current plan of care       Recommendations  Diet recommendations: Dysphagia 3 (mechanical soft);Nectar-thick liquid Medication Administration: Whole meds with puree Supervision: Staff to assist with self feeding;Full supervision/cueing for compensatory strategies Compensations: Minimize environmental distractions;Slow rate;Small sips/bites Postural Changes and/or Swallow Maneuvers: Seated upright 90 degrees;Upright 30-60 min after meal                Oral Care Recommendations: Oral care BID;Staff/trained caregiver to provide oral care Follow up Recommendations: 24 hour  supervision/assistance SLP Visit Diagnosis: Dysphagia, unspecified (R13.10) Plan: Continue with current plan of care       Thank you,  Havery Moros, CCC-SLP [phone]                 Audrielle Vankuren 01/05/2020, 4:24 PM

## 2020-01-05 NOTE — Progress Notes (Signed)
PROGRESS NOTE  MARVELYN BOUCHILLON NTZ:[id number] DOB: 09-Jul-1965 DOA: 01/01/2020 PCP: Terrial Rhodes, MD  Brief History   53yow PMH strok, DM type 2, resides in group home presented w/ generalzied weakness, found to generally weak, hypotensive. Sepsis workup initiated. Labs revealed lactic acidosis, metabolic acidosis, acute renal failure. Admitted for eval and tx of acute renal failure and acidosis. Seen by nephrology. Renal fxn improving w/ IVF, hyperkalemia and acidosis resolved. Calcium remains low and will replete. Continue IVF to see further improvement prior to release.   A & P  AKI on CKD stage IIIb w/ metabolic acidosis and hyperkalemia complicated by lisinopril and metformin --Thought secondary to ATN secondary to sepsis and UTI, complicated by Metformin and ACE inhibitor as an outpatient.  Renal ultrasound no hydronephrosis. --Fortunately renal function continues to improve with resolution of metabolic acidosis and hyperkalemia.  No evidence of significant volume overload.  Urine output picking up. --Continue management per nephrology.  Continue sodium bicarbonate for now.  Sepsis secondary to E. coli UTI. BC pending, CXR NAD --Low-grade temperature last night with tachycardia of unclear etiology.  She appears stable now with normal heart rate and exam is benign.  Blood cultures no growth to date, continues on oral antibiotic for E. coli UTI.  DM type 2 --Well-controlled with no recent lows.  Diet controlled.  No sliding scale insulin.  Dysphagia unspecified R13.10 --Continue dysphagia 1, nectar-thick liquids diet  Hypocalcemia, severe but asymptomatic. --Improved but still low.  Replete aggressively.  Check BMP in a.m.  Anemia of CKD --Has been stable, check CBC in a.m.  Disposition Plan:  Discussion: Appears stable on exam, renal function is improving, hemodynamics are stable and there is no signs or symptoms to suggest worsening infection or sepsis.  We will continue  fluids and antibiotics and trend BMP.  Remain in stepdown unit today.  Status is: Inpatient  Remains inpatient appropriate because:IV treatments appropriate due to intensity of illness or inability to take PO and Inpatient level of care appropriate due to severity of illness   Dispo: The patient is from: Group home              Anticipated d/c is to: Group home              Anticipated d/c date is: 3 days              Patient currently is not medically stable to d/c.  DVT prophylaxis: heparin injection 5,000 Units Start: 01/01/20 2330 SCDs Start: 01/01/20 2315   Code Status: Full Code Family Communication: Guardian [phone], will call again today  Brendia Sacks, MD  [facility] Direct contact: see [email or web address] (further directions at bottom of note if needed) 7PM-7AM contact night coverage as at bottom of note 01/05/2020, 9:56 AM  LOS: 4 days   Significant Hospital Events   .    Consults:  . Nephrology    Procedures:  .   Significant Diagnostic Tests:  . CXR NAD . AXR nonspecific prominent SB loops no obstruction . Renal u/s no hydro   Micro Data:  . UC E coli   Antimicrobials:  . Keflex  Interval History/Subjective  CC: f/u AKI  Was transferred out last night but developed low-grade temperature and tachycardia so was sent back to the ICU.  Blood pressure improved with fluids.  Patient feels well, no complaints, no pain.  Objective   Vitals:  Vitals:   01/05/20 0900 01/05/20 0905  BP: (!) 131/95 (!) 131/95  Pulse: 92  Resp: 14   Temp:    SpO2: 98%     Exam:  Constitutional:   . Appears calm and comfortable lying in bed ENMT:  . grossly normal hearing  . Lips appear dry Respiratory:  . CTA bilaterally, no w/r/r.  . Respiratory effort normal.  Cardiovascular:  . RRR, no m/r/g . Telemetry SR w/ NSVT 7 beat . No change in bilateral loew extremity edema pedal edema   Abdomen:  . Soft, ntnd, skin appears unremarkable Musculoskeletal:    . Moves all extremities Psychiatric:  . Mental status o Mood, affect appropriate . Thinks she is at [facility], does not answer time questions   I have personally reviewed the following:   Today's Data  . UOP 2900, I/O not accurate w/ unrecorded voids . CBG remains stable . K+ 6.6 > 6.0 > 5.4 > 4.1 >3.5 . Creatinine 5.7 > 4.23 > 2.79 . AG WNL . CO2 now 26  Scheduled Meds: . amLODipine  5 mg Oral q morning - 10a  . aspirin  81 mg Oral Daily  . atorvastatin  40 mg Oral Q2000  . busPIRone  10 mg Oral TID  . cephALEXin  500 mg Oral Q12H  . chlorhexidine  15 mL Mouth Rinse BID  . Chlorhexidine Gluconate Cloth  6 each Topical Daily  . clopidogrel  75 mg Oral Q breakfast  . ferrous sulfate  325 mg Oral Q breakfast  . folic acid  1 mg Oral Daily  . heparin  5,000 Units Subcutaneous Q8H  . mouth rinse  15 mL Mouth Rinse q12n4p  . megestrol  40 mg Oral Daily  . mometasone-formoterol  2 puff Inhalation BID  . nystatin cream   Topical BID  . sodium bicarbonate  650 mg Oral TID  . traZODone  100 mg Oral QHS  . vancomycin variable dose per unstable renal function (pharmacist dosing)   Does not apply See admin instructions  . vitamin B-12  1,000 mcg Oral Daily  . Vitamin D3  1,000 Units Oral Daily   Continuous Infusions: . sodium chloride 100 mL/hr at 01/05/20 0930  . calcium gluconate 1,000 mg (01/05/20 0931)    Principal Problem:   AKI (acute kidney injury) (HCC) Active Problems:   Sepsis (HCC)   Type 2 diabetes mellitus with hypoglycemia without coma (HCC)   Pressure injury of skin   Dysphagia   Hypocalcemia   Anemia in chronic kidney disease (CKD)   Acute lower UTI   LOS: 4 days   How to contact the [facility] Attending or Consulting provider 7A - 7P or covering provider during after hours 7P -7A, for this patient?  1. Check the care team in [facility] and look for a) attending/consulting TRH provider listed and b) the [facility] team listed 2. Log into [email or web address] and use [facility]  universal password to access. If you do not have the password, please contact the hospital operator. 3. Locate the [facility] provider you are looking for under [facility] and page to a number that you can be directly reached. 4. If you still have difficulty reaching the provider, please page the [facility] (Director on Call) for the Hospitalists listed on amion for assistance.

## 2020-01-05 NOTE — Progress Notes (Signed)
Patient ID: [name] [name], female   DOB: [birth year], 54 y.o.   MRN: [id number] S: Transferred back to ICU last night due to hypotension.  BP's improved with IVF.  No complaints this morning.  Thinks she is at [facility]. O:BP (!) 131/95   Pulse (!) 55   Temp 98.7 F (37.1 C) (Oral)   Resp 15   Ht 5\' 8"  (1.727 m)   Wt 64.3 kg   SpO2 98%   BMI 21.55 kg/m   Intake/Output Summary (Last 24 hours) at 01/05/2020 0911 Last data filed at 01/05/2020 0700 Gross per 24 hour  Intake 2352.76 ml  Output 2900 ml  Net -547.24 ml   Intake/Output: I/O last 3 completed shifts: In: 3468.1 [I.V.:3318; IV Piggyback:150.1] Out: 2900 [Urine:2900]  Intake/Output this shift:  No intake/output data recorded. Weight change:  Gen: NAD, slowed mentation CVS: bradycardic no rub Resp: cta Abd: benign Ext: 1+ pedal edema with some erythema and scaling.  Recent Labs  Lab 01/01/20 1830 01/02/20 0117 01/02/20 1059 01/02/20 1903 01/03/20 0502 01/04/20 0538 01/05/20 0546  NA 139 139 140 139 140 142 145  K 6.6* 5.6* 6.0* 5.6* 5.4* 4.1 3.5  CL 105 106 107 107 106 106 109  CO2 11* 12* 13* 12* 16* 24 26  GLUCOSE 68* 54* 78 102* 90 81 115*  BUN 91* 86* 86* 86* 87* 72* 49*  CREATININE 6.44* 6.19* 6.18* 6.01* 5.70* 4.23* 2.79*  ALBUMIN 3.0* 2.6* 2.4*  --  2.3*  --   --   CALCIUM 7.1* 7.0* 6.4* 6.0* 6.2* 5.7* 6.6*  PHOS  --   --  8.1*  --  6.7*  --   --   AST 103* 104*  --   --   --   --   --   ALT 40 40  --   --   --   --   --    Liver Function Tests: Recent Labs  Lab 01/01/20 1830 01/01/20 1830 01/02/20 0117 01/02/20 1059 01/03/20 0502  AST 103*  --  104*  --   --   ALT 40  --  40  --   --   ALKPHOS 53  --  46  --   --   BILITOT 0.5  --  0.5  --   --   PROT 7.5  --  6.6  --   --   ALBUMIN 3.0*   < > 2.6* 2.4* 2.3*   < > = values in this interval not displayed.   Recent Labs  Lab 01/02/20 1059  LIPASE 15   No results for input(s): AMMONIA in the last 168  hours. CBC: Recent Labs  Lab 01/01/20 1830 01/03/20 0502  WBC 15.6* 11.5*  NEUTROABS 14.0*  --   HGB 9.8* 9.8*  HCT 32.0* 30.2*  MCV 99.4 95.3  PLT 420* 357   Cardiac Enzymes: No results for input(s): CKTOTAL, CKMB, CKMBINDEX, TROPONINI in the last 168 hours. CBG: Recent Labs  Lab 01/02/20 1935 01/03/20 0808 01/03/20 1153 01/03/20 1605 01/03/20 2315  GLUCAP 91 86 81 73 84    Iron Studies: No results for input(s): IRON, TIBC, TRANSFERRIN, FERRITIN in the last 72 hours. Studies/Results: No results found. 01/05/20 amLODipine  5 mg Oral q morning - 10a  . aspirin  81 mg Oral Daily  . atorvastatin  40 mg Oral Q2000  . busPIRone  10 mg Oral TID  . cephALEXin  500 mg Oral Q12H  . chlorhexidine  15 mL Mouth Rinse BID  . Chlorhexidine Gluconate Cloth  6 each Topical Daily  . clopidogrel  75 mg Oral Q breakfast  . ferrous sulfate  325 mg Oral Q breakfast  . folic acid  1 mg Oral Daily  . heparin  5,000 Units Subcutaneous Q8H  . mouth rinse  15 mL Mouth Rinse q12n4p  . megestrol  40 mg Oral Daily  . mometasone-formoterol  2 puff Inhalation BID  . nystatin cream   Topical BID  . sodium bicarbonate  650 mg Oral TID  . traZODone  100 mg Oral QHS  . vancomycin variable dose per unstable renal function (pharmacist dosing)   Does not apply See admin instructions  . vitamin B-12  1,000 mcg Oral Daily  . Vitamin D3  1,000 Units Oral Daily    BMET    Component Value Date/Time   NA 145 01/05/2020 0546   K 3.5 01/05/2020 0546   CL 109 01/05/2020 0546   CO2 26 01/05/2020 0546   GLUCOSE 115 (H) 01/05/2020 0546   BUN 49 (H) 01/05/2020 0546   CREATININE 2.79 (H) 01/05/2020 0546   CALCIUM 6.6 (L) 01/05/2020 0546   GFRNONAA 20 (L) 01/05/2020 0546   GFRAA >60 10/22/2019 0541   CBC    Component Value Date/Time   WBC 11.5 (H) 01/03/2020 0502   RBC 3.17 (L) 01/03/2020 0502   HGB 9.8 (L) 01/03/2020 0502   HCT 30.2 (L) 01/03/2020 0502   PLT 357 01/03/2020 0502   MCV 95.3 01/03/2020  0502   MCH 30.9 01/03/2020 0502   MCHC 32.5 01/03/2020 0502   RDW 12.7 01/03/2020 0502   LYMPHSABS 0.9 01/01/2020 1830   MONOABS 0.6 01/01/2020 1830   EOSABS 0.0 01/01/2020 1830   BASOSABS 0.0 01/01/2020 1830    Assessment/Plan: 1.  AKI/CKD stage IIIb- presumably due to ischemic ATN in setting of sepsis/hypovolemia and concomitant ACE inhibition.  BUN/Cr and K improving with IVF's.  1. Continue to hold ACE and metformin 2. Continue with IVF's now on NS 3. Renal US w/o obstruction 4. Increased UOP and improved BUN/Cr 5. No indication for dialysis at this time.  Poor candidate for longterm dialysis. 6. Will continue to follow UOP and Scr.  2. Hyperkalemia- improved. 3. Metabolic acidosis- due to #1.  improved with isotonic bicarb, now on IV NS.  4. E. Coli urosepsis- blood cultures negative.  On po cefuroxime.  5. AMS- due to sepsis and hypotension.  CT scan negative for acute changes.  She is able to answer simple questions now. 6. DM- per primary. 7. Bilateral pedal edema- possible cellulitis of left foot.  Denies any tenderness. 8. HTN- would not resume ACE or ARB as risks outweigh benefits given recurrent issues with AKI/CKD.  Irena Cords, MD [employer] [phone]

## 2020-01-06 DIAGNOSIS — E119 Type 2 diabetes mellitus without complications: Secondary | ICD-10-CM

## 2020-01-06 LAB — CBC
HCT: 30 % — ABNORMAL LOW (ref 36.0–46.0)
Hemoglobin: 9.2 g/dL — ABNORMAL LOW (ref 12.0–15.0)
MCH: 30.2 pg (ref 26.0–34.0)
MCHC: 30.7 g/dL (ref 30.0–36.0)
MCV: 98.4 fL (ref 80.0–100.0)
Platelets: 323 10*3/uL (ref 150–400)
RBC: 3.05 MIL/uL — ABNORMAL LOW (ref 3.87–5.11)
RDW: 12.6 % (ref 11.5–15.5)
WBC: 8 10*3/uL (ref 4.0–10.5)
nRBC: 0 % (ref 0.0–0.2)

## 2020-01-06 LAB — CULTURE, BLOOD (ROUTINE X 2)
Culture: NO GROWTH
Culture: NO GROWTH
Special Requests: ADEQUATE
Special Requests: ADEQUATE

## 2020-01-06 LAB — RENAL FUNCTION PANEL
Albumin: 2.5 g/dL — ABNORMAL LOW (ref 3.5–5.0)
Anion gap: 12 (ref 5–15)
BUN: 35 mg/dL — ABNORMAL HIGH (ref 6–20)
CO2: 25 mmol/L (ref 22–32)
Calcium: 7.4 mg/dL — ABNORMAL LOW (ref 8.9–10.3)
Chloride: 110 mmol/L (ref 98–111)
Creatinine, Ser: 2.06 mg/dL — ABNORMAL HIGH (ref 0.44–1.00)
GFR, Estimated: 28 mL/min — ABNORMAL LOW (ref >60)
Glucose, Bld: 83 mg/dL (ref 70–99)
Phosphorus: 1.7 mg/dL — ABNORMAL LOW (ref 2.5–4.6)
Potassium: 4.1 mmol/L (ref 3.5–5.1)
Sodium: 147 mmol/L — ABNORMAL HIGH (ref 135–145)

## 2020-01-06 LAB — GLUCOSE, CAPILLARY
Glucose-Capillary: 71 mg/dL (ref 70–99)
Glucose-Capillary: 231 mg/dL — ABNORMAL HIGH (ref 70–99)
Glucose-Capillary: 103 mg/dL — ABNORMAL HIGH (ref 70–99)

## 2020-01-06 MED ORDER — RESOURCE THICKENUP CLEAR PO POWD
ORAL | Status: DC | PRN
  Filled 2020-01-06: qty 125

## 2020-01-06 MED ORDER — CALCIUM GLUCONATE-NACL 1-0.675 GM/50ML-% IV SOLN
1.0000 g | INTRAVENOUS | Status: AC
  Administered 2020-01-06 (×2): 1000 mg via INTRAVENOUS
  Filled 2020-01-06: qty 50

## 2020-01-06 MED ORDER — POTASSIUM PHOSPHATES 15 MMOLE/5ML IV SOLN: 30.0000 mmol | Freq: Once | INTRAVENOUS | Status: DC

## 2020-01-06 MED ORDER — POTASSIUM PHOSPHATES 15 MMOLE/5ML IV SOLN
20.0000 mmol | Freq: Once | INTRAVENOUS | Status: AC
  Administered 2020-01-06: 20 mmol via INTRAVENOUS
  Filled 2020-01-06: qty 6.67

## 2020-01-06 NOTE — Plan of Care (Signed)
°  Problem: Acute Rehab PT Goals(only PT should resolve) Goal: Pt Will Go Supine/Side To Sit Outcome: Progressing Flowsheets (Taken 01/06/2020 1007) Pt will go Supine/Side to Sit:  with modified independence  with supervision Goal: Patient Will Transfer Sit To/From Stand Outcome: Progressing Flowsheets (Taken 01/06/2020 1007) Patient will transfer sit to/from stand: with min guard assist Goal: Pt Will Transfer Bed To Chair/Chair To Bed Outcome: Progressing Flowsheets (Taken 01/06/2020 1007) Pt will Transfer Bed to Chair/Chair to Bed: min guard assist Goal: Pt Will Ambulate Outcome: Progressing Flowsheets (Taken 01/06/2020 1007) Pt will Ambulate:  50 feet  with minimal assist  with rolling walker   10:07 AM, 01/06/20 Ocie Bob, MPT Physical Therapist with [facility] [phone] office [phone] mobile phone

## 2020-01-06 NOTE — Evaluation (Signed)
Physical Therapy Evaluation Patient Details Name: [name] [name] MRN: [id number] DOB: [birth year] Today's Date: 01/06/2020   History of Present Illness  [place] Sorn  is a 54 y.o. female, with history of stroke, urinary incontinence, fecal incontinence, diabetes mellitus type 2, depression/anxiety and more presents to the ED with a chief complaint of generalized weakness.  Unfortunately patient is not able to provide any history reporting that she does not know why she is here and that she has not been sick.  Patient is not in any pain at the time of my exam.  She is alert and oriented to self and place.  Chart review reveals that group home staff noted decreased energy and mobility starting 12/31/2019.  No fever was noted, no changes to medications.  They had not noticed any change in breathing, and patient denies shortness of breath at the time of the exam.  Patient also denies feeling nauseous at the time of exam.  When she arrived in the ED she was found to have generalized weakness, fatigue, and hypotension.  Sepsis work-up was initiated.  Patient had a white blood cell count of 15.6, heart rate of 101, and hypotension down to 87/60.  She was given the full 30 mL/kg bolus.  UA was done that is borderline.  It shows many bacteria, 0-5 white blood cells, and small leukocytes.  Urine culture is pending.  UDS was negative.  Chest x-ray showed no active disease.  CT head showed chronic my small vessel ischemic changes.  On chemistry panel patient was found to have metabolic acidosis with a bicarb of 11, hyperkalemia at 6.6, acute renal failure at 91 BUN and 6.44 creatinine.  Gap was greater than 20.  BNP 54, troponin II.  Lactic acid 5.4-4.3.  Blood cultures were drawn.  FOBT negative.  Patient was started on cefepime, bank, albuterol, given a gram of calcium, given sodium bicarb 1 amp, started on Lokelma.  ED provider called nephrology who urged shifting measures for potassium and Lokelma, but does not  think that emergent dialysis is necessary.    Clinical Impression  Patient demonstrates slow labored movement for sitting up at bedside requiring use of bed rail, has to lean on armrest of chair during transfers without AD, slow labored unsteady cadence using RW to ambulate in room, limited mostly due to c/o fatigue and tolerated sitting up in chair after therapy - RN aware.  Patient will benefit from continued physical therapy in hospital and recommended venue below to increase strength, balance, endurance for safe ADLs and gait.     Follow Up Recommendations SNF;Supervision for mobility/OOB;Supervision - Intermittent    Equipment Recommendations  Rolling walker with 5" wheels    Recommendations for Other Services       Precautions / Restrictions Precautions Precautions: Fall Restrictions Weight Bearing Restrictions: No      Mobility  Bed Mobility Overal bed mobility: Needs Assistance Bed Mobility: Supine to Sit     Supine to sit: Min guard     General bed mobility comments: increased time, labored movement    Transfers Overall transfer level: Needs assistance Equipment used: Rolling walker (2 wheeled);1 person hand held assist;None Transfers: Sit to/from [employer] Sit to Stand: Min assist;Min guard Stand pivot transfers: Min assist;Min guard       General transfer comment: has to use armrest of chair when not using RW, slow labored movement  Ambulation/Gait Ambulation/Gait assistance: Min assist;Mod assist Gait Distance (Feet): 20 Feet Assistive device: Rolling walker (2 wheeled)  Gait Pattern/deviations: Decreased step length - right;Decreased step length - left;Decreased stride length;Decreased dorsiflexion - right Gait velocity: decreased   General Gait Details: slow labored unsteady cadence with decreased right ankle dorsiflexion, no loss of balance, limited secondary to fatigue  Stairs            Wheelchair Mobility    Modified  Rankin (Stroke Patients Only)       Balance Overall balance assessment: Needs assistance Sitting-balance support: Feet supported;No upper extremity supported Sitting balance-Leahy Scale: Fair Sitting balance - Comments: fair/good seated at EOB   Standing balance support: During functional activity;No upper extremity supported Standing balance-Leahy Scale: Poor Standing balance comment: fair using RW                             Pertinent Vitals/Pain Pain Assessment: No/denies pain    Home Living Family/patient expects to be discharged to:: Group home                      Prior Function Level of Independence: Needs assistance   Gait / Transfers Assistance Needed: 1 person assisted short household distances leaning on walls, nearby objects for support, "per patient", 1 person assist for transfers to wheelchair  ADL's / Homemaking Assistance Needed: Pt reports independent with bathing and dressing. Pt reports facility provides cooking and cleaning.        Hand Dominance   Dominant Hand: Right    Extremity/Trunk Assessment   Upper Extremity Assessment Upper Extremity Assessment: Generalized weakness    Lower Extremity Assessment Lower Extremity Assessment: Generalized weakness    Cervical / Trunk Assessment Cervical / Trunk Assessment: Kyphotic  Communication   Communication: No difficulties  Cognition Arousal/Alertness: Awake/alert Behavior During Therapy: WFL for tasks assessed/performed Overall Cognitive Status: Within Functional Limits for tasks assessed                                        General Comments      Exercises     Assessment/Plan    PT Assessment Patient needs continued PT services  PT Problem List Decreased strength;Decreased activity tolerance;Decreased balance;Decreased mobility       PT Treatment Interventions Balance training;DME instruction;Gait training;Functional mobility training;Therapeutic  activities;Therapeutic exercise;Stair training;Patient/family education    PT Goals (Current goals can be found in the Care Plan section)  Acute Rehab PT Goals Patient Stated Goal: return to group home with assistance PT Goal Formulation: With patient Time For Goal Achievement: 01/20/20 Potential to Achieve Goals: Good    Frequency Min 3X/week   Barriers to discharge        Co-evaluation               AM-PAC PT "6 Clicks" Mobility  Outcome Measure Help needed turning from your back to your side while in a flat bed without using bedrails?: None Help needed moving from lying on your back to sitting on the side of a flat bed without using bedrails?: A Little Help needed moving to and from a bed to a chair (including a wheelchair)?: A Lot Help needed standing up from a chair using your arms (e.g., wheelchair or bedside chair)?: A Little Help needed to walk in hospital room?: A Lot Help needed climbing 3-5 steps with a railing? : A Lot 6 Click Score: 16    End of Session  Activity Tolerance: Patient tolerated treatment well;Patient limited by fatigue Patient left: in chair;with call bell/phone within reach Nurse Communication: Mobility status PT Visit Diagnosis: Unsteadiness on feet (R26.81);Other abnormalities of gait and mobility (R26.89);Muscle weakness (generalized) (M62.81)    Time: 9417-4081 PT Time Calculation (min) (ACUTE ONLY): 30 min   Charges:   PT Evaluation $PT Eval Moderate Complexity: 1 Mod PT Treatments $Therapeutic Activity: 23-37 mins        10:06 AM, 01/06/20 Ocie Bob, MPT Physical Therapist with [facility] [phone] office [phone] mobile phone

## 2020-01-06 NOTE — TOC Progression Note (Signed)
Transition of Care [facility]) - Progression Note   Patient Details  Name: [name] [name] MRN: [id number] Date of Birth: 04-12-1965  Transition of Care [facility]) CM/SW Contact  Ewing Schlein, LCSW Phone Number: 01/06/2020, 11:52 AM  Clinical Narrative: CSW called [place] DSS guardianship supervisor, Myriam Forehand [phone]), to follow up regarding patient's hospital admission. Ms. Linard Millers requested that patient's discharge summary be faxed to [phone] at discharge. The plan continues to be the patient will return to her group home. CSW left voicemail with [name] with [facility] requesting call back as CSW was unable to reach the facility administrator, Jimmye Norman, and could not leave a voicemail. TOC to follow.  Expected Discharge Plan: Home/Self Care Barriers to Discharge: Continued Medical Work up  Expected Discharge Plan and Services Expected Discharge Plan: Home/Self Care In-house Referral: Clinical Social Work Discharge Planning Services: NA Post Acute Care Choice: NA Living arrangements for the past 2 months: Group Home           DME Arranged: N/A DME Agency: NA HH Arranged: NA HH Agency: NA  Readmission Risk Interventions Readmission Risk Prevention Plan 01/02/2020 10/04/2017  Post Dischage Appt - Complete  Medication Screening - Complete  Transportation Screening Complete Complete  PCP follow-up - Complete  HRI or Home Care Consult Complete -  Social Work Consult for Recovery Care Planning/Counseling Complete -  Palliative Care Screening Not Applicable -  Medication Review Oceanographer) Complete -  Some recent data might be hidden

## 2020-01-06 NOTE — Progress Notes (Addendum)
PROGRESS NOTE  [name] [name] ZOX:[id number] DOB: Sep 29, 1965 DOA: 01/01/2020 PCP: Terrial Rhodes, MD  Brief History   53yow PMH strok, DM type 2, resides in group home presented w/ generalzied weakness, found to generally weak, hypotensive. Sepsis workup initiated. Labs revealed lactic acidosis, metabolic acidosis, acute renal failure. Admitted for eval and tx of acute renal failure and acidosis. Seen by nephrology. Renal fxn improving w/ IVF, hyperkalemia and acidosis resolved. Calcium remains low and will replete. Continue IVF to see further improvement prior to release, anticipated for 11/17.   A & P  AKI on CKD stage IIIb w/ metabolic acidosis and hyperkalemia complicated by lisinopril and metformin --secondary to ATN secondary to sepsis and UTI, complicated by Metformin and ACE inhibitor as an outpatient.  Renal ultrasound no hydronephrosis. --Fortunately continues to improve with creatinine trending down and adequate urine output. --Continue management per nephrology.    Sepsis secondary to E. coli UTI. BC pending, CXR NAD --Appears resolved at this point.  Continue oral antibiotic.  DM type 2 --Well-controlled with no recent lows.  Diet controlled.  No sliding scale insulin.  Dysphagia unspecified R13.10 --Continue dysphagia 1, nectar-thick liquids diet  Hypocalcemia, severe but asymptomatic. --Improving with repletion.  Replete further today.  Anemia of CKD --Hemoglobin stable.  No further evaluation.  Disposition Plan:  Discussion: No new issues.  Renal function improving.  No signs or symptoms of developing infection or worsening of illness.  Transfer to medical bed.  Continue IV fluids and monitor creatinine.  If creatinine continues to improve, likely return to group home 11/17.  Status is: Inpatient  Remains inpatient appropriate because:IV treatments appropriate due to intensity of illness or inability to take PO and Inpatient level of care appropriate due to  severity of illness   Dispo: The patient is from: Group home              Anticipated d/c is to: Group home              Anticipated d/c date is: 1 day              Patient currently is not medically stable to d/c.  DVT prophylaxis: heparin injection 5,000 Units Start: 01/01/20 2330 SCDs Start: 01/01/20 2315   Code Status: Full Code Family Communication: Kela Millin [place] [phone] > left message  Brendia Sacks, MD  [facility] Direct contact: see [email or web address] (further directions at bottom of note if needed) 7PM-7AM contact night coverage as at bottom of note 01/06/2020, 10:06 AM  LOS: 5 days   Significant Hospital Events   .    Consults:  . Nephrology    Procedures:  .   Significant Diagnostic Tests:  . CXR NAD . AXR nonspecific prominent SB loops no obstruction . Renal u/s no hydro   Micro Data:  . UC E coli   Antimicrobials:  . Keflex  Interval History/Subjective  CC: f/u AKI  No issues overnight.  Feels well, no complaints.  Tolerating diet.  Objective   Vitals:  Vitals:   01/06/20 0500 01/06/20 0723  BP: 121/74   Pulse:    Resp: 15 18  Temp:  99 F (37.2 C)  SpO2:      Exam:  Constitutional:   . Appears calm and comfortable ENMT:  . grossly normal hearing  Respiratory:  . CTA bilaterally, no w/r/r.  . Respiratory effort normal. Cardiovascular:  . RRR, no m/r/g . No significant LE extremity edema, pedal edema trace Psychiatric:  . Mental status  o Mood, affect appropriate o Orientation to self, "[facility]"   I have personally reviewed the following:   Today's Data  . UOP 1700, I/O not accurate w/ unrecorded voids . CBG remains stable . K+ 6.6 > 6.0 > 5.4 > 4.1 >3.5 . Creatinine 5.7 > 4.23 > 2.79 > 2.06 . AG WNL . Phose 1.7 . Hgb stable at 9.2  Scheduled Meds: . amLODipine  5 mg Oral q morning - 10a  . aspirin  81 mg Oral Daily  . atorvastatin  40 mg Oral Q2000  . busPIRone  10 mg Oral TID  . cephALEXin   500 mg Oral Q12H  . chlorhexidine  15 mL Mouth Rinse BID  . Chlorhexidine Gluconate Cloth  6 each Topical Daily  . clopidogrel  75 mg Oral Q breakfast  . ferrous sulfate  325 mg Oral Q breakfast  . folic acid  1 mg Oral Daily  . heparin  5,000 Units Subcutaneous Q8H  . mouth rinse  15 mL Mouth Rinse q12n4p  . megestrol  40 mg Oral Daily  . mometasone-formoterol  2 puff Inhalation BID  . nystatin cream   Topical BID  . sodium bicarbonate  650 mg Oral TID  . traZODone  100 mg Oral QHS  . vitamin B-12  1,000 mcg Oral Daily  . Vitamin D3  1,000 Units Oral Daily   Continuous Infusions: . calcium gluconate    . potassium PHOSPHATE IVPB (in mmol)      Principal Problem:   AKI (acute kidney injury) (HCC) Active Problems:   Sepsis (HCC)   Type 2 diabetes mellitus with hypoglycemia without coma (HCC)   Pressure injury of skin   Dysphagia   Hypocalcemia   Anemia in chronic kidney disease (CKD)   Acute lower UTI   LOS: 5 days   How to contact the [facility] Attending or Consulting provider 7A - 7P or covering provider during after hours 7P -7A, for this patient?  1. Check the care team in [facility] and look for a) attending/consulting TRH provider listed and b) the [facility] team listed 2. Log into [email or web address] and use [place]'s universal password to access. If you do not have the password, please contact the hospital operator. 3. Locate the [facility] provider you are looking for under [facility] and page to a number that you can be directly reached. 4. If you still have difficulty reaching the provider, please page the [facility] (Director on Call) for the Hospitalists listed on amion for assistance.

## 2020-01-06 NOTE — Progress Notes (Signed)
Patient ID: [name] [name], female   DOB: 01/15/1966, 54 y.o.   MRN: [id number] S: No events overnight.  Sitting up eating breakfast.  No new complaints. O:BP 121/74   Pulse 97   Temp 99 F (37.2 C) (Oral)   Resp 18   Ht 5\' 8"  (1.727 m)   Wt 64.1 kg   SpO2 99%   BMI 21.49 kg/m   Intake/Output Summary (Last 24 hours) at 01/06/2020 0924 Last data filed at 01/06/2020 0600 Gross per 24 hour  Intake 548.72 ml  Output 1700 ml  Net -1151.28 ml   Intake/Output: I/O last 3 completed shifts: In: 1734.5 [P.O.:100; I.V.:1532.6; IV Piggyback:101.8] Out: 2600 [Urine:2600]  Intake/Output this shift:  No intake/output data recorded. Weight change:  Gen: slowed mentation, NAD CVS:RRR, no rub Resp: cta Abd: +BS, soft, NT/ND Ext: trace bilateral pedal edema  Recent Labs  Lab 01/01/20 1830 01/01/20 1830 01/02/20 0117 01/02/20 1059 01/02/20 1903 01/03/20 0502 01/04/20 0538 01/05/20 0546 01/06/20 0506  NA 139   < > 139 140 139 140 142 145 147*  K 6.6*   < > 5.6* 6.0* 5.6* 5.4* 4.1 3.5 4.1  CL 105   < > 106 107 107 106 106 109 110  CO2 11*   < > 12* 13* 12* 16* 24 26 25   GLUCOSE 68*   < > 54* 78 102* 90 81 115* 83  BUN 91*   < > 86* 86* 86* 87* 72* 49* 35*  CREATININE 6.44*   < > 6.19* 6.18* 6.01* 5.70* 4.23* 2.79* 2.06*  ALBUMIN 3.0*  --  2.6* 2.4*  --  2.3*  --   --  2.5*  CALCIUM 7.1*   < > 7.0* 6.4* 6.0* 6.2* 5.7* 6.6* 7.4*  PHOS  --   --   --  8.1*  --  6.7*  --   --  1.7*  AST 103*  --  104*  --   --   --   --   --   --   ALT 40  --  40  --   --   --   --   --   --    < > = values in this interval not displayed.   Liver Function Tests: Recent Labs  Lab 01/01/20 1830 01/01/20 1830 01/02/20 0117 01/02/20 0117 01/02/20 1059 01/03/20 0502 01/06/20 0506  AST 103*  --  104*  --   --   --   --   ALT 40  --  40  --   --   --   --   ALKPHOS 53  --  46  --   --   --   --   BILITOT 0.5  --  0.5  --   --   --   --   PROT 7.5  --  6.6  --   --   --   --   ALBUMIN 3.0*   <  > 2.6*   < > 2.4* 2.3* 2.5*   < > = values in this interval not displayed.   Recent Labs  Lab 01/02/20 1059  LIPASE 15   No results for input(s): AMMONIA in the last 168 hours. CBC: Recent Labs  Lab 01/01/20 1830 01/03/20 0502 01/06/20 0506  WBC 15.6* 11.5* 8.0  NEUTROABS 14.0*  --   --   HGB 9.8* 9.8* 9.2*  HCT 32.0* 30.2* 30.0*  MCV 99.4 95.3 98.4  PLT 420* 357 323  Cardiac Enzymes: No results for input(s): CKTOTAL, CKMB, CKMBINDEX, TROPONINI in the last 168 hours. CBG: Recent Labs  Lab 01/03/20 2315 01/05/20 1115 01/05/20 1647 01/05/20 2217 01/06/20 0738  GLUCAP 84 131* 104* 87 71    Iron Studies: No results for input(s): IRON, TIBC, TRANSFERRIN, FERRITIN in the last 72 hours. Studies/Results: No results found. Marland Kitchen amLODipine  5 mg Oral q morning - 10a  . aspirin  81 mg Oral Daily  . atorvastatin  40 mg Oral Q2000  . busPIRone  10 mg Oral TID  . cephALEXin  500 mg Oral Q12H  . chlorhexidine  15 mL Mouth Rinse BID  . Chlorhexidine Gluconate Cloth  6 each Topical Daily  . clopidogrel  75 mg Oral Q breakfast  . ferrous sulfate  325 mg Oral Q breakfast  . folic acid  1 mg Oral Daily  . heparin  5,000 Units Subcutaneous Q8H  . mouth rinse  15 mL Mouth Rinse q12n4p  . megestrol  40 mg Oral Daily  . mometasone-formoterol  2 puff Inhalation BID  . nystatin cream   Topical BID  . sodium bicarbonate  650 mg Oral TID  . traZODone  100 mg Oral QHS  . vitamin B-12  1,000 mcg Oral Daily  . Vitamin D3  1,000 Units Oral Daily    BMET    Component Value Date/Time   NA 147 (H) 01/06/2020 0506   K 4.1 01/06/2020 0506   CL 110 01/06/2020 0506   CO2 25 01/06/2020 0506   GLUCOSE 83 01/06/2020 0506   BUN 35 (H) 01/06/2020 0506   CREATININE 2.06 (H) 01/06/2020 0506   CALCIUM 7.4 (L) 01/06/2020 0506   GFRNONAA 28 (L) 01/06/2020 0506   GFRAA >60 10/22/2019 0541   CBC    Component Value Date/Time   WBC 8.0 01/06/2020 0506   RBC 3.05 (L) 01/06/2020 0506   HGB 9.2  (L) 01/06/2020 0506   HCT 30.0 (L) 01/06/2020 0506   PLT 323 01/06/2020 0506   MCV 98.4 01/06/2020 0506   MCH 30.2 01/06/2020 0506   MCHC 30.7 01/06/2020 0506   RDW 12.6 01/06/2020 0506   LYMPHSABS 0.9 01/01/2020 1830   MONOABS 0.6 01/01/2020 1830   EOSABS 0.0 01/01/2020 1830   BASOSABS 0.0 01/01/2020 1830     Assessment/Plan: 1. AKI/CKD stage IIIb- presumably due to ischemic ATN in setting of sepsis/hypovolemia and concomitant ACE inhibition. BUN/Cr and K improving with IVF's.  1. Continue to hold ACE and metformin 2. Continue with IVF's now on NS 3. Renal US w/o obstruction 4. Increased UOP and improved BUN/Cr 5. No indication for dialysis at this time. Poor candidate for longterm dialysis. 6. Will continue to follow UOP and Scr.  7. Would not resume ACE or ARB as risks outweigh benefits given multiple episodes of AKI/CKD. 2. Hyperkalemia- improved. 3. Metabolic acidosis- due to #1. improved with isotonic bicarb, now on IV NS.  4. E. Coli urosepsis- blood cultures negative.  On po cefuroxime.  5. AMS- due to sepsis and hypotension. CT scan negative for acute changes. Oriented to self and place (knows she is in an hospital but thinks its [facility]).  Does not know month, day, year, or the president.  Unknown baseline.  6. DM- per primary. 7. Anemia of critical illness- continue to follow. 8. Severe protein malnutrition- cont with protein supplements. 9. Bilateral pedal edema- improved overnight. Denies any tenderness. 10. HTN- would not resume ACE or ARB as risks outweigh benefits given recurrent issues with  AKI/CKD.  Irena Cords, MD [employer] [phone]

## 2020-01-07 LAB — RENAL FUNCTION PANEL
Albumin: 2.4 g/dL — ABNORMAL LOW (ref 3.5–5.0)
Anion gap: 9 (ref 5–15)
BUN: 27 mg/dL — ABNORMAL HIGH (ref 6–20)
CO2: 24 mmol/L (ref 22–32)
Calcium: 7 mg/dL — ABNORMAL LOW (ref 8.9–10.3)
Chloride: 111 mmol/L (ref 98–111)
Creatinine, Ser: 1.66 mg/dL — ABNORMAL HIGH (ref 0.44–1.00)
GFR, Estimated: 37 mL/min — ABNORMAL LOW (ref >60)
Glucose, Bld: 96 mg/dL (ref 70–99)
Phosphorus: 1.6 mg/dL — ABNORMAL LOW (ref 2.5–4.6)
Potassium: 4 mmol/L (ref 3.5–5.1)
Sodium: 144 mmol/L (ref 135–145)

## 2020-01-07 LAB — CBC
HCT: 29.5 % — ABNORMAL LOW (ref 36.0–46.0)
Hemoglobin: 9 g/dL — ABNORMAL LOW (ref 12.0–15.0)
MCH: 29.8 pg (ref 26.0–34.0)
MCHC: 30.5 g/dL (ref 30.0–36.0)
MCV: 97.7 fL (ref 80.0–100.0)
Platelets: 282 10*3/uL (ref 150–400)
RBC: 3.02 MIL/uL — ABNORMAL LOW (ref 3.87–5.11)
RDW: 12.4 % (ref 11.5–15.5)
WBC: 7.6 10*3/uL (ref 4.0–10.5)
nRBC: 0 % (ref 0.0–0.2)

## 2020-01-07 LAB — GLUCOSE, CAPILLARY
Glucose-Capillary: 85 mg/dL (ref 70–99)
Glucose-Capillary: 356 mg/dL — ABNORMAL HIGH (ref 70–99)

## 2020-01-07 MED ORDER — SODIUM BICARBONATE 650 MG PO TABS: 650.0000 mg | ORAL_TABLET | Freq: Three times a day (TID) | ORAL | 0 refills | Status: AC

## 2020-01-07 MED ORDER — CEPHALEXIN 500 MG PO CAPS: 500.0000 mg | ORAL_CAPSULE | Freq: Two times a day (BID) | ORAL | 0 refills | Status: AC

## 2020-01-07 MED ORDER — AMLODIPINE BESYLATE 5 MG PO TABS: 5.0000 mg | ORAL_TABLET | Freq: Every morning | ORAL | 2 refills | Status: DC

## 2020-01-07 NOTE — Progress Notes (Signed)
Patient ID: [name] [name], female   DOB: 08-Mar-1965, 54 y.o.   MRN: [id number]  S: No events overnight.  Resting comfortably. No complaints.  O:BP 127/74 (BP Location: Left Arm)   Pulse 100   Temp 99.3 F (37.4 C) (Oral)   Resp 20   Ht 5\' 8"  (1.727 m)   Wt 59.2 kg   SpO2 100%   BMI 19.84 kg/m   Intake/Output Summary (Last 24 hours) at 01/07/2020 0816 Last data filed at 01/06/2020 1500 Gross per 24 hour  Intake 262.42 ml  Output --  Net 262.42 ml   Intake/Output: I/O last 3 completed shifts: In: 262.4 [IV Piggyback:262.4] Out: 700 [Urine:700]  Intake/Output this shift:  No intake/output data recorded. Weight change: -4.9 kg Gen: slowed mentation, NAD CVS:RRR, no rub Resp: cta Abd: +BS, soft, NT/ND Ext: trace bilateral pedal edema  Recent Labs  Lab 01/01/20 1830 01/01/20 1830 01/02/20 0117 01/02/20 0117 01/02/20 1059 01/02/20 1903 01/03/20 0502 01/04/20 0538 01/05/20 0546 01/06/20 0506 01/07/20 0644  NA 139   < > 139   < > 140 139 140 142 145 147* 144  K 6.6*   < > 5.6*   < > 6.0* 5.6* 5.4* 4.1 3.5 4.1 4.0  CL 105   < > 106   < > 107 107 106 106 109 110 111  CO2 11*   < > 12*   < > 13* 12* 16* 24 26 25 24   GLUCOSE 68*   < > 54*   < > 78 102* 90 81 115* 83 96  BUN 91*   < > 86*   < > 86* 86* 87* 72* 49* 35* 27*  CREATININE 6.44*   < > 6.19*   < > 6.18* 6.01* 5.70* 4.23* 2.79* 2.06* 1.66*  ALBUMIN 3.0*  --  2.6*  --  2.4*  --  2.3*  --   --  2.5* 2.4*  CALCIUM 7.1*   < > 7.0*   < > 6.4* 6.0* 6.2* 5.7* 6.6* 7.4* 7.0*  PHOS  --   --   --   --  8.1*  --  6.7*  --   --  1.7* 1.6*  AST 103*  --  104*  --   --   --   --   --   --   --   --   ALT 40  --  40  --   --   --   --   --   --   --   --    < > = values in this interval not displayed.   Liver Function Tests: Recent Labs  Lab 01/01/20 1830 01/01/20 1830 01/02/20 0117 01/02/20 1059 01/03/20 0502 01/06/20 0506 01/07/20 0644  AST 103*  --  104*  --   --   --   --   ALT 40  --  40  --   --   --   --    ALKPHOS 53  --  46  --   --   --   --   BILITOT 0.5  --  0.5  --   --   --   --   PROT 7.5  --  6.6  --   --   --   --   ALBUMIN 3.0*   < > 2.6*   < > 2.3* 2.5* 2.4*   < > = values in this interval not displayed.   Recent Labs  Lab  01/02/20 1059  LIPASE 15   No results for input(s): AMMONIA in the last 168 hours. CBC: Recent Labs  Lab 01/01/20 1830 01/01/20 1830 01/03/20 0502 01/06/20 0506 01/07/20 0644  WBC 15.6*   < > 11.5* 8.0 7.6  NEUTROABS 14.0*  --   --   --   --   HGB 9.8*   < > 9.8* 9.2* 9.0*  HCT 32.0*   < > 30.2* 30.0* 29.5*  MCV 99.4  --  95.3 98.4 97.7  PLT 420*   < > 357 323 282   < > = values in this interval not displayed.   Cardiac Enzymes: No results for input(s): CKTOTAL, CKMB, CKMBINDEX, TROPONINI in the last 168 hours. CBG: Recent Labs  Lab 01/05/20 2217 01/06/20 0738 01/06/20 1139 01/06/20 1632 01/07/20 0736  GLUCAP 87 71 231* 103* 85    Iron Studies: No results for input(s): IRON, TIBC, TRANSFERRIN, FERRITIN in the last 72 hours. Studies/Results: No results found. Marland Kitchen amLODipine  5 mg Oral q morning - 10a  . aspirin  81 mg Oral Daily  . atorvastatin  40 mg Oral Q2000  . busPIRone  10 mg Oral TID  . cephALEXin  500 mg Oral Q12H  . chlorhexidine  15 mL Mouth Rinse BID  . Chlorhexidine Gluconate Cloth  6 each Topical Daily  . clopidogrel  75 mg Oral Q breakfast  . ferrous sulfate  325 mg Oral Q breakfast  . folic acid  1 mg Oral Daily  . heparin  5,000 Units Subcutaneous Q8H  . mouth rinse  15 mL Mouth Rinse q12n4p  . megestrol  40 mg Oral Daily  . mometasone-formoterol  2 puff Inhalation BID  . nystatin cream   Topical BID  . sodium bicarbonate  650 mg Oral TID  . traZODone  100 mg Oral QHS  . vitamin B-12  1,000 mcg Oral Daily  . Vitamin D3  1,000 Units Oral Daily    BMET    Component Value Date/Time   NA 144 01/07/2020 0644   K 4.0 01/07/2020 0644   CL 111 01/07/2020 0644   CO2 24 01/07/2020 0644   GLUCOSE 96 01/07/2020  0644   BUN 27 (H) 01/07/2020 0644   CREATININE 1.66 (H) 01/07/2020 0644   CALCIUM 7.0 (L) 01/07/2020 0644   GFRNONAA 37 (L) 01/07/2020 0644   GFRAA >60 10/22/2019 0541   CBC    Component Value Date/Time   WBC 7.6 01/07/2020 0644   RBC 3.02 (L) 01/07/2020 0644   HGB 9.0 (L) 01/07/2020 0644   HCT 29.5 (L) 01/07/2020 0644   PLT 282 01/07/2020 0644   MCV 97.7 01/07/2020 0644   MCH 29.8 01/07/2020 0644   MCHC 30.5 01/07/2020 0644   RDW 12.4 01/07/2020 0644   LYMPHSABS 0.9 01/01/2020 1830   MONOABS 0.6 01/01/2020 1830   EOSABS 0.0 01/01/2020 1830   BASOSABS 0.0 01/01/2020 1830     Assessment/Plan: 1. AKI/CKD stage IIIb- baseline Cr likely around 1. presumably due to ischemic ATN in setting of sepsis/hypovolemia and concomitant ACE inhibition. BUN/Cr and K improved with IVF's and continues to improve off fluids. 1. Continue to hold ACE and metformin 2. Off IVF, encouraged PO intake 3. Renal US w/o obstruction 4. No indication for dialysis at this time. Poor candidate for longterm dialysis. 5. Will continue to follow UOP and Scr.  6. Would not resume ACE or ARB for now as risks outweigh benefits given multiple episodes of AKI/CKD. 7. Offered her to  follow up at either our [place] or [place] office however she lives in [place] and wants to stay local. Can be referred to [facility] (clinic present in [place]) 2. Hyperkalemia- improved. 3. Metabolic acidosis- due to #1. improved with isotonic bicarb, off IVF now 4. Sepsis secondary to E. Coli UTI- blood cultures negative.  On po cefuroxime.  5. AMS- due to sepsis and hypotension. CT scan negative for acute changes. 6. DM- per primary. 7. Anemia of critical illness- continue to follow. 8. Severe protein malnutrition- cont with protein supplements. 9. Bilateral pedal edema- stable. Denies any tenderness. 10. HTN- would not resume ACE or ARB for now as risks outweigh benefits given recurrent issues with AKI/CKD.  Controlled with amlodipine  Will sign off from a nephrology perspective. Discussed with primary service. Thank you for involving [place] in the care of this patient. Please call with any questions/concerns.  Anthony Sar, MD [facility]

## 2020-01-07 NOTE — Progress Notes (Signed)
  Speech Language Pathology Treatment: Dysphagia  Patient Details Name: [name] [name] MRN: [id number] DOB: [birth year] Today's Date: 01/07/2020 Time: 9485-4627 SLP Time Calculation (min) (ACUTE ONLY): 22 min  Assessment / Plan / Recommendation Clinical Impression  Pt seen for ongoing dysphagia intervention. Pt with dried, bloody lips, however oral cavity looks clear. Oral care provided. Pt seen with breakfast meal of D3 and NTL with trials of thin water via cup and straw. Pt requires set up assistance, but is then able to self feed. Pt with prolonged oral phase with solids, impaired mastication and likely reduced lingual movement resulting in oral residuals which eventually clear with repeat swallows and liquid wash. Pt without overt signs of reduced airway protection with thin liquids. Will advance to thin liquids and continue D3. SLP will continue to follow.    HPI HPI: 53yow PMH strok, DM type 2, resides in group home presented w/ generalzied weakness, foun dto generally weak, hypotensive. Sepsis workup initiated. Labs revealed lactic acidosis, metabolic acidosis, acute renal failure. Admitted for eval and tx of acute renal failure and acidosis. Sepsis considered on admission but no source.       SLP Plan  Continue with current plan of care       Recommendations  Diet recommendations: Dysphagia 3 (mechanical soft);Thin liquid Liquids provided via: Cup;Straw Medication Administration: Whole meds with puree Supervision: Staff to assist with self feeding;Full supervision/cueing for compensatory strategies Compensations: Minimize environmental distractions;Slow rate;Small sips/bites Postural Changes and/or Swallow Maneuvers: Seated upright 90 degrees;Upright 30-60 min after meal                Oral Care Recommendations: Oral care BID;Staff/trained caregiver to provide oral care Follow up Recommendations: 24 hour supervision/assistance SLP Visit Diagnosis: Dysphagia, unspecified  (R13.10) Plan: Continue with current plan of care       Thank you,  Havery Moros, CCC-SLP [phone]                 Glendoris Nodarse 01/07/2020, 9:42 AM

## 2020-01-07 NOTE — Discharge Summary (Addendum)
Physician Discharge Summary  [name] [name] MEQ:[id number] DOB: 1965-07-09 DOA: 01/01/2020  PCP: Terrial Rhodes, MD  Admit date: 01/01/2020  Discharge date: 01/07/2020  Admitted From:Group home  Disposition:  Group home  Recommendations for Outpatient Follow-up:  1. Follow up with PCP in 1-2 weeks, will be arranged by TOC 2. Please obtain BMP/CBC in one week 3. Patient will need outpatient nephrology follow-up likely at [facility], can be arranged by PCP 4. Continue on other medications as prior 5. Hold further ACE inhibitors/ARB 6. Hold Metformin 7. Finish course of treatment for UTI with Keflex for 1 more day  Home Health: Yes with RN for wound care  Equipment/Devices: None  Discharge Condition: Stable  CODE STATUS: Full  Diet recommendation: Dysphagia 3  Brief/Interim Summary: 53yow PMH strok, DM type 2, resides in group home presented w/ generalzied weakness, found to generally weak, hypotensive. Sepsis workup initiated. Labs revealed lactic acidosis, metabolic acidosis, acute renal failure. Admitted for eval and tx of acute renal failure and acidosis. Seen by nephrology. Renal fxn improving w/ IVF, hyperkalemia and acidosis resolved. Calcium remains low and will replete. Continue IVF to see further improvement prior to release, anticipated for 11/17.  -Patient is doing well on day of discharge with creatinine 1.66 and has been seen by nephrology with no further plans at this time aside from outpatient follow-up.  Recommendations to hold further ACE inhibitor and Metformin in outpatient setting.  Diabetes has remained controlled and she has now been escalated to dysphagia 3 diet.  Her sepsis has resolved and she is stable for discharge back to her group home with wound care with home health RN.  She will remain on Keflex for 1 more day for her E. coli UTI for a total 5-day course of treatment.  Discharge Diagnoses:  Principal Problem:   AKI (acute kidney injury)  (HCC) Active Problems:   Sepsis (HCC)   Type 2 diabetes mellitus with hypoglycemia without coma (HCC)   Pressure injury of skin   Dysphagia   Hypocalcemia   Anemia in chronic kidney disease (CKD)   Acute lower UTI  Principal discharge diagnosis: AKI on CKD stage IIIb along with sepsis secondary to E. coli UTI-present on admission.  Discharge Instructions  Discharge Instructions    Diet - low sodium heart healthy   Complete by: As directed    Discharge wound care:   Complete by: As directed    Wash wounds on left buttock with soap and water. Pat dry. Apply Skin Barrier wipes [employer] [place] [phone]) around the wound, allow to air dry. Then place a hydrocolloid dressing [employer] [place] [phone]) over the wound. Perform daily.  Gently wash feet with soap and water, pat dry. Apply Sween Moisturizer (pink and white tube in clean utility) to feet.  Then place feet in [employer].   Increase activity slowly   Complete by: As directed      Allergies as of 01/07/2020      Reactions   Latex Rash   Severe itching.      Medication List    STOP taking these medications   HYDROcodone-acetaminophen 5-325 MG tablet Commonly known as: NORCO/VICODIN   lisinopril 20 MG tablet Commonly known as: ZESTRIL   metFORMIN 500 MG 24 hr tablet Commonly known as: GLUCOPHAGE-XR     TAKE these medications   albuterol 108 (90 Base) MCG/ACT inhaler Commonly known as: VENTOLIN HFA Inhale 2 puffs into the lungs every 4 (four) hours as needed for wheezing or shortness of breath.  amLODipine 5 MG tablet Commonly known as: NORVASC Take 1 tablet (5 mg total) by mouth every morning. Start taking on: January 08, 2020 What changed:   medication strength  how much to take   aspirin EC 81 MG tablet Take 1 tablet (81 mg total) by mouth daily. Swallow whole.   atorvastatin 40 MG tablet Commonly known as: LIPITOR Take 40 mg by mouth daily at 8 pm. (2000)   busPIRone 10 MG tablet Commonly known as:  BUSPAR Take 10 mg by mouth 3 (three) times daily.   cephALEXin 500 MG capsule Commonly known as: KEFLEX Take 1 capsule (500 mg total) by mouth every 12 (twelve) hours for 1 day.   cholecalciferol 25 MCG (1000 UNIT) tablet Commonly known as: VITAMIN D3 Take 1,000 Units by mouth every morning.   cholecalciferol 25 MCG (1000 UNIT) tablet Commonly known as: VITAMIN D Take 1 tablet (1,000 Units total) by mouth daily. (0800)   clopidogrel 75 MG tablet Commonly known as: PLAVIX Take 1 tablet (75 mg total) by mouth daily with breakfast.   Dexlansoprazole 30 MG capsule Take 30 mg by mouth daily.   diclofenac sodium 1 % Gel Commonly known as: VOLTAREN Apply 2 g topically 4 (four) times daily as needed. What changed: when to take this   docusate sodium 100 MG capsule Commonly known as: COLACE Take 1 capsule (100 mg total) by mouth 2 (two) times daily.   ferrous sulfate 325 (65 FE) MG tablet Take 1 tablet (325 mg total) by mouth 2 (two) times daily with a meal. What changed: when to take this   Fluticasone-Salmeterol 100-50 MCG/DOSE Aepb Commonly known as: ADVAIR Inhale 1 puff into the lungs 2 (two) times daily.   folic acid 1 MG tablet Commonly known as: FOLVITE Take 1 mg by mouth daily.   furosemide 20 MG tablet Commonly known as: LASIX Take 1 tablet (20 mg total) by mouth 2 (two) times daily. (0800) What changed: how much to take   megestrol 40 MG tablet Commonly known as: MEGACE Take 40 mg by mouth daily.   nicotine 21 mg/24hr patch Commonly known as: NICODERM CQ - dosed in mg/24 hours Place 1 patch (21 mg total) onto the skin daily.   pantoprazole 40 MG tablet Commonly known as: PROTONIX Take 1 tablet (40 mg total) by mouth daily.   sodium bicarbonate 650 MG tablet Take 1 tablet (650 mg total) by mouth 3 (three) times daily.   traMADol 50 MG tablet Commonly known as: ULTRAM Take 50 mg by mouth 4 (four) times daily.   traZODone 100 MG tablet Commonly known  as: DESYREL Take 100 mg by mouth at bedtime.   vitamin B-12 1000 MCG tablet Commonly known as: CYANOCOBALAMIN Take 1,000 mcg by mouth daily.            Discharge Care Instructions  (From admission, onward)         Start     Ordered   01/07/20 0000  Discharge wound care:       Comments: Wash wounds on left buttock with soap and water. Pat dry. Apply Skin Barrier wipes [employer] [place] [phone]) around the wound, allow to air dry. Then place a hydrocolloid dressing [employer] [place] [phone]) over the wound. Perform daily.  Gently wash feet with soap and water, pat dry. Apply Sween Moisturizer (pink and white tube in clean utility) to feet.  Then place feet in [employer].   01/07/20 1103          Follow-up  Information    pcp Follow up in 1 week(s).              Allergies  Allergen Reactions  . Latex Rash    Severe itching.    Consultations:  Nephrology   Procedures/Studies: CT Head Wo Contrast  Result Date: 01/01/2020 CLINICAL DATA:  Altered level of consciousness, history of falls EXAM: CT HEAD WITHOUT CONTRAST TECHNIQUE: Contiguous axial images were obtained from the base of the skull through the vertex without intravenous contrast. COMPARISON:  10/20/2019 FINDINGS: Brain: Hypodensities of the bilateral basal ganglia and periventricular white matter are stable, consistent with chronic small vessel ischemic changes. No signs of acute infarct or hemorrhage. The lateral ventricles and remaining midline structures are unremarkable. No acute extra-axial fluid collections. No mass effect. Vascular: No hyperdense vessel or unexpected calcification. Skull: Normal. Negative for fracture or focal lesion. Sinuses/Orbits: No acute finding. Other: None. IMPRESSION: 1. Chronic small vessel ischemic changes. No acute intracranial process. Electronically Signed   By: Sharlet Salina M.D.   On: 01/01/2020 16:41   US RENAL  Result Date: 01/02/2020 CLINICAL DATA:  Acute kidney injury EXAM: RENAL /  URINARY TRACT ULTRASOUND COMPLETE COMPARISON:  10/21/2019 FINDINGS: Right Kidney: Renal measurements: 11.2 x 5.2 x 5.7 cm = volume: 173 mL. Prominent renal cortical echogenicity. No hydronephrosis or mass. Subcentimeter simple cyst along the upper renal cortex. Left Kidney: Renal measurements: 10.4 x 5.5 x 4.8 cm = volume: 142 mL. Echogenicity within normal limits. No mass or hydronephrosis visualized. Bladder: Appears normal for degree of bladder distention. Other: Narrow sonographic windows the left kidney and bladder. IMPRESSION: 1. Stable from scan 2 weeks ago. 2. Medical renal disease without hydronephrosis or asymmetry. Electronically Signed   By: Marnee Spring M.D.   On: 01/02/2020 10:58   DG Chest Port 1 View  Result Date: 01/01/2020 CLINICAL DATA:  Sepsis, weakness EXAM: PORTABLE CHEST 1 VIEW COMPARISON:  09/07/2018 FINDINGS: The heart size and mediastinal contours are within normal limits. Both lungs are clear. The visualized skeletal structures are unremarkable. IMPRESSION: No active disease. Electronically Signed   By: Sharlet Salina M.D.   On: 01/01/2020 16:17   DG Abd Portable 1V  Result Date: 01/02/2020 CLINICAL DATA:  Abdominal pain. EXAM: PORTABLE ABDOMEN - 1 VIEW COMPARISON:  02/03/2006 abdominal radiographs. FINDINGS: Gas is seen within diffusely prominent small bowel loops. No pneumoperitoneum. Mild stool burden. No suspicious calcific densities. Mild lumbar and bilateral hip degenerative changes. No acute osseous abnormality. IMPRESSION: Diffusely prominent proximal small bowel loops. Mild stool burden. Electronically Signed   By: Stana Bunting M.D.   On: 01/02/2020 12:46     Discharge Exam: Vitals:   01/07/20 0441 01/07/20 0846  BP: 127/74   Pulse: 100   Resp: 20   Temp: 99.3 F (37.4 C)   SpO2: 100% 99%   Vitals:   01/06/20 1930 01/07/20 0034 01/07/20 0441 01/07/20 0846  BP: (!) 139/106 (!) 121/99 127/74   Pulse: (!) 110 (!) 103 100   Resp: 20 20 20    Temp:  98.4 F (36.9 C) 98.2 F (36.8 C) 99.3 F (37.4 C)   TempSrc: Oral Oral Oral   SpO2: 100% 100% 100% 99%  Weight:      Height:        General: Pt is alert, awake, not in acute distress Cardiovascular: RRR, S1/S2 +, no rubs, no gallops Respiratory: CTA bilaterally, no wheezing, no rhonchi Abdominal: Soft, NT, ND, bowel sounds + Extremities: no edema, no cyanosis  The results of significant diagnostics from this hospitalization (including imaging, microbiology, ancillary and laboratory) are listed below for reference.     Microbiology: Recent Results (from the past 240 hour(s))  Respiratory Panel by RT PCR (Flu A&B, Covid) - Nasopharyngeal Swab     Status: None   Collection Time: 01/01/20  3:08 PM   Specimen: Nasopharyngeal Swab  Result Value Ref Range Status   SARS Coronavirus 2 by RT PCR NEGATIVE NEGATIVE Final    Comment: (NOTE) SARS-CoV-2 target nucleic acids are NOT DETECTED.  The SARS-CoV-2 RNA is generally detectable in upper respiratoy specimens during the acute phase of infection. The lowest concentration of SARS-CoV-2 viral copies this assay can detect is 131 copies/mL. A negative result does not preclude SARS-Cov-2 infection and should not be used as the sole basis for treatment or other patient management decisions. A negative result may occur with  improper specimen collection/handling, submission of specimen other than nasopharyngeal swab, presence of viral mutation(s) within the areas targeted by this assay, and inadequate number of viral copies (<131 copies/mL). A negative result must be combined with clinical observations, patient history, and epidemiological information. The expected result is Negative.  Fact Sheet for Patients:  https://www.moore.com/  Fact Sheet for Healthcare Providers:  https://www.young.biz/  This test is no t yet approved or cleared by the [place] FDA and  has been authorized for  detection and/or diagnosis of SARS-CoV-2 by FDA under an Emergency Use Authorization (EUA). This EUA will remain  in effect (meaning this test can be used) for the duration of the COVID-19 declaration under Section 564(b)(1) of the Act, 21 U.S.C. section 360bbb-3(b)(1), unless the authorization is terminated or revoked sooner.     Influenza A by PCR NEGATIVE NEGATIVE Final   Influenza B by PCR NEGATIVE NEGATIVE Final    Comment: (NOTE) The Xpert Xpress SARS-CoV-2/FLU/RSV assay is intended as an aid in  the diagnosis of influenza from Nasopharyngeal swab specimens and  should not be used as a sole basis for treatment. Nasal washings and  aspirates are unacceptable for Xpert Xpress SARS-CoV-2/FLU/RSV  testing.  Fact Sheet for Patients: https://www.moore.com/  Fact Sheet for Healthcare Providers: https://www.young.biz/  This test is not yet approved or cleared by the [place] FDA and  has been authorized for detection and/or diagnosis of SARS-CoV-2 by  FDA under an Emergency Use Authorization (EUA). This EUA will remain  in effect (meaning this test can be used) for the duration of the  Covid-19 declaration under Section 564(b)(1) of the Act, 21  U.S.C. section 360bbb-3(b)(1), unless the authorization is  terminated or revoked. Performed at [facility], [address], [place], [place] [address]   Blood Culture (routine x 2)     Status: None   Collection Time: 01/01/20  6:30 PM   Specimen: Left Antecubital; Blood  Result Value Ref Range Status   Specimen Description LEFT ANTECUBITAL  Final   Special Requests   Final    BOTTLES DRAWN AEROBIC AND ANAEROBIC Blood Culture adequate volume   Culture   Final    NO GROWTH 5 DAYS Performed at [facility], [address], [place], [place] [address]    Report Status 01/06/2020 FINAL  Final  Blood Culture (routine x 2)     Status: None   Collection Time: 01/01/20  6:30 PM   Specimen: BLOOD  LEFT HAND  Result Value Ref Range Status   Specimen Description BLOOD LEFT HAND  Final   Special Requests   Final  BOTTLES DRAWN AEROBIC AND ANAEROBIC Blood Culture adequate volume   Culture   Final    NO GROWTH 5 DAYS Performed at [facility], [address], [place], [place] [address]    Report Status 01/06/2020 FINAL  Final  Urine culture     Status: Abnormal   Collection Time: 01/01/20  6:55 PM   Specimen: In/Out Cath Urine  Result Value Ref Range Status   Specimen Description   Final    IN/OUT CATH URINE Performed at [facility], [address] [place], [place] [address]    Special Requests   Final    NONE Performed at [facility], [address], [place], [place] [address]    Culture >=100,000 COLONIES/mL ESCHERICHIA COLI (A)  Final   Report Status 01/04/2020 FINAL  Final   Organism ID, Bacteria ESCHERICHIA COLI (A)  Final      Susceptibility   Escherichia coli - MIC*    AMPICILLIN <=2 SENSITIVE Sensitive     CEFAZOLIN <=4 SENSITIVE Sensitive     CEFEPIME <=0.12 SENSITIVE Sensitive     CEFTRIAXONE <=0.25 SENSITIVE Sensitive     CIPROFLOXACIN <=0.25 SENSITIVE Sensitive     GENTAMICIN <=1 SENSITIVE Sensitive     IMIPENEM <=0.25 SENSITIVE Sensitive     NITROFURANTOIN 32 SENSITIVE Sensitive     TRIMETH/SULFA <=20 SENSITIVE Sensitive     AMPICILLIN/SULBACTAM <=2 SENSITIVE Sensitive     PIP/TAZO <=4 SENSITIVE Sensitive     * >=100,000 COLONIES/mL ESCHERICHIA COLI  MRSA PCR Screening     Status: None   Collection Time: 01/03/20  8:33 AM   Specimen: Nasal Mucosa; Nasopharyngeal  Result Value Ref Range Status   MRSA by PCR NEGATIVE NEGATIVE Final    Comment:        The GeneXpert MRSA Assay (FDA approved for NASAL specimens only), is one component of a comprehensive MRSA colonization surveillance program. It is not intended to diagnose MRSA infection nor to guide or monitor treatment for MRSA infections. Performed at [facility], [address], [place],  [place] [address]      Labs: BNP (last 3 results) Recent Labs    01/01/20 1830  BNP 54.0   Basic Metabolic Panel: Recent Labs  Lab 01/02/20 0117 01/02/20 0117 01/02/20 1059 01/02/20 1903 01/03/20 0502 01/04/20 0538 01/05/20 0546 01/06/20 0506 01/07/20 0644  NA 139   < > 140   < > 140 142 145 147* 144  K 5.6*   < > 6.0*   < > 5.4* 4.1 3.5 4.1 4.0  CL 106   < > 107   < > 106 106 109 110 111  CO2 12*   < > 13*   < > 16* GLUCOSE 54*   < > 78   < > 90 81 115* 83 96  BUN 86*   < > 86*   < > 87* 72* 49* 35* 27*  CREATININE 6.19*   < > 6.18*   < > 5.70* 4.23* 2.79* 2.06* 1.66*  CALCIUM 7.0*   < > 6.4*   < > 6.2* 5.7* 6.6* 7.4* 7.0*  MG 1.0*  --   --   --   --   --   --   --   --   PHOS  --   --  8.1*  --  6.7*  --   --  1.7* 1.6*   < > = values in this interval not displayed.   Liver Function Tests:  Recent Labs  Lab 01/01/20 1830 01/01/20 1830 01/02/20 0117 01/02/20 1059 01/03/20 0502 01/06/20 0506 01/07/20 0644  AST 103*  --  104*  --   --   --   --   ALT 40  --  40  --   --   --   --   ALKPHOS 53  --  46  --   --   --   --   BILITOT 0.5  --  0.5  --   --   --   --   PROT 7.5  --  6.6  --   --   --   --   ALBUMIN 3.0*   < > 2.6* 2.4* 2.3* 2.5* 2.4*   < > = values in this interval not displayed.   Recent Labs  Lab 01/02/20 1059  LIPASE 15   No results for input(s): AMMONIA in the last 168 hours. CBC: Recent Labs  Lab 01/01/20 1830 01/03/20 0502 01/06/20 0506 01/07/20 0644  WBC 15.6* 11.5* 8.0 7.6  NEUTROABS 14.0*  --   --   --   HGB 9.8* 9.8* 9.2* 9.0*  HCT 32.0* 30.2* 30.0* 29.5*  MCV 99.4 95.3 98.4 97.7  PLT 420* 357 323 282   Cardiac Enzymes: No results for input(s): CKTOTAL, CKMB, CKMBINDEX, TROPONINI in the last 168 hours. BNP: Invalid input(s): POCBNP CBG: Recent Labs  Lab 01/06/20 0738 01/06/20 1139 01/06/20 1632 01/07/20 0736 01/07/20 1107  GLUCAP 71 231* 103* 85 356*   D-Dimer No results for input(s): DDIMER in the last 72  hours. Hgb A1c No results for input(s): HGBA1C in the last 72 hours. Lipid Profile No results for input(s): CHOL, HDL, LDLCALC, TRIG, CHOLHDL, LDLDIRECT in the last 72 hours. Thyroid function studies No results for input(s): TSH, T4TOTAL, T3FREE, THYROIDAB in the last 72 hours.  Invalid input(s): FREET3 Anemia work up No results for input(s): VITAMINB12, FOLATE, FERRITIN, TIBC, IRON, RETICCTPCT in the last 72 hours. Urinalysis    Component Value Date/Time   COLORURINE YELLOW 01/01/2020 1855   APPEARANCEUR HAZY (A) 01/01/2020 1855   LABSPEC 1.014 01/01/2020 1855   PHURINE 5.0 01/01/2020 1855   GLUCOSEU NEGATIVE 01/01/2020 1855   HGBUR MODERATE (A) 01/01/2020 1855   BILIRUBINUR NEGATIVE 01/01/2020 1855   KETONESUR NEGATIVE 01/01/2020 1855   PROTEINUR 100 (A) 01/01/2020 1855   NITRITE NEGATIVE 01/01/2020 1855   LEUKOCYTESUR SMALL (A) 01/01/2020 1855   Sepsis Labs Invalid input(s): PROCALCITONIN,  WBC,  LACTICIDVEN Microbiology Recent Results (from the past 240 hour(s))  Respiratory Panel by RT PCR (Flu A&B, Covid) - Nasopharyngeal Swab     Status: None   Collection Time: 01/01/20  3:08 PM   Specimen: Nasopharyngeal Swab  Result Value Ref Range Status   SARS Coronavirus 2 by RT PCR NEGATIVE NEGATIVE Final    Comment: (NOTE) SARS-CoV-2 target nucleic acids are NOT DETECTED.  The SARS-CoV-2 RNA is generally detectable in upper respiratoy specimens during the acute phase of infection. The lowest concentration of SARS-CoV-2 viral copies this assay can detect is 131 copies/mL. A negative result does not preclude SARS-Cov-2 infection and should not be used as the sole basis for treatment or other patient management decisions. A negative result may occur with  improper specimen collection/handling, submission of specimen other than nasopharyngeal swab, presence of viral mutation(s) within the areas targeted by this assay, and inadequate number of viral copies (<131 copies/mL). A  negative result must be combined with clinical observations, patient history, and epidemiological information.  The expected result is Negative.  Fact Sheet for Patients:  https://www.moore.com/  Fact Sheet for Healthcare Providers:  https://www.young.biz/  This test is no t yet approved or cleared by the [place] FDA and  has been authorized for detection and/or diagnosis of SARS-CoV-2 by FDA under an Emergency Use Authorization (EUA). This EUA will remain  in effect (meaning this test can be used) for the duration of the COVID-19 declaration under Section 564(b)(1) of the Act, 21 U.S.C. section 360bbb-3(b)(1), unless the authorization is terminated or revoked sooner.     Influenza A by PCR NEGATIVE NEGATIVE Final   Influenza B by PCR NEGATIVE NEGATIVE Final    Comment: (NOTE) The Xpert Xpress SARS-CoV-2/FLU/RSV assay is intended as an aid in  the diagnosis of influenza from Nasopharyngeal swab specimens and  should not be used as a sole basis for treatment. Nasal washings and  aspirates are unacceptable for Xpert Xpress SARS-CoV-2/FLU/RSV  testing.  Fact Sheet for Patients: https://www.moore.com/  Fact Sheet for Healthcare Providers: https://www.young.biz/  This test is not yet approved or cleared by the [place] FDA and  has been authorized for detection and/or diagnosis of SARS-CoV-2 by  FDA under an Emergency Use Authorization (EUA). This EUA will remain  in effect (meaning this test can be used) for the duration of the  Covid-19 declaration under Section 564(b)(1) of the Act, 21  U.S.C. section 360bbb-3(b)(1), unless the authorization is  terminated or revoked. Performed at [facility], [address] [place], [place] [address]   Blood Culture (routine x 2)     Status: None   Collection Time: 01/01/20  6:30 PM   Specimen: Left Antecubital; Blood  Result Value Ref Range Status    Specimen Description LEFT ANTECUBITAL  Final   Special Requests   Final    BOTTLES DRAWN AEROBIC AND ANAEROBIC Blood Culture adequate volume   Culture   Final    NO GROWTH 5 DAYS Performed at [facility], [address] [place], [place] [address]    Report Status 01/06/2020 FINAL  Final  Blood Culture (routine x 2)     Status: None   Collection Time: 01/01/20  6:30 PM   Specimen: BLOOD LEFT HAND  Result Value Ref Range Status   Specimen Description BLOOD LEFT HAND  Final   Special Requests   Final    BOTTLES DRAWN AEROBIC AND ANAEROBIC Blood Culture adequate volume   Culture   Final    NO GROWTH 5 DAYS Performed at [facility], [address] [place], [place] [address]    Report Status 01/06/2020 FINAL  Final  Urine culture     Status: Abnormal   Collection Time: 01/01/20  6:55 PM   Specimen: In/Out Cath Urine  Result Value Ref Range Status   Specimen Description   Final    IN/OUT CATH URINE Performed at [facility], [address], [place], [place] [address]    Special Requests   Final    NONE Performed at [facility], [address] [place], [place] [address]    Culture >=100,000 COLONIES/mL ESCHERICHIA COLI (A)  Final   Report Status 01/04/2020 FINAL  Final   Organism ID, Bacteria ESCHERICHIA COLI (A)  Final      Susceptibility   Escherichia coli - MIC*    AMPICILLIN <=2 SENSITIVE Sensitive     CEFAZOLIN <=4 SENSITIVE Sensitive     CEFEPIME <=0.12 SENSITIVE Sensitive     CEFTRIAXONE <=0.25 SENSITIVE Sensitive     CIPROFLOXACIN <=  0.25 SENSITIVE Sensitive     GENTAMICIN <=1 SENSITIVE Sensitive     IMIPENEM <=0.25 SENSITIVE Sensitive     NITROFURANTOIN 32 SENSITIVE Sensitive     TRIMETH/SULFA <=20 SENSITIVE Sensitive     AMPICILLIN/SULBACTAM <=2 SENSITIVE Sensitive     PIP/TAZO <=4 SENSITIVE Sensitive     * >=100,000 COLONIES/mL ESCHERICHIA COLI  MRSA PCR Screening     Status: None   Collection Time: 01/03/20  8:33 AM   Specimen: Nasal Mucosa; Nasopharyngeal   Result Value Ref Range Status   MRSA by PCR NEGATIVE NEGATIVE Final    Comment:        The GeneXpert MRSA Assay (FDA approved for NASAL specimens only), is one component of a comprehensive MRSA colonization surveillance program. It is not intended to diagnose MRSA infection nor to guide or monitor treatment for MRSA infections. Performed at [facility], [address] [place], [place] [id number]      Time coordinating discharge: 35 minutes  SIGNED:   Erick BlinksPratik D Ilyaas Musto, DO [facility] 01/07/2020, 11:36 AM  If 7PM-7AM, please contact night-coverage [email or web address]

## 2020-01-07 NOTE — [place] FL2 (Signed)
[place] MEDICAID FL2 LEVEL OF CARE SCREENING TOOL     IDENTIFICATION  Patient Name: [name] [name] Birthdate: [birth year] Sex: female Admission Date (Current Location): 01/01/2020  [facility] and [place] Number:  [employer] and Address:  [facility],  [address] [place] [address]      Provider Number: [id number]  Attending Physician Name and Address:  Erick Blinks, DO  Relative Name and Phone Number:       Current Level of Care: Hospital Recommended Level of Care: Assisted Living Facility [facility]) Prior Approval Number:    Date Approved/Denied:   PASRR Number:    Discharge Plan: Domiciliary (Rest home) [facility])    Current Diagnoses: Patient Active Problem List   Diagnosis Date Noted  . Acute lower UTI 01/04/2020  . Pressure injury of skin 01/03/2020  . Dysphagia 01/03/2020  . Hypocalcemia 01/03/2020  . Anemia in chronic kidney disease (CKD) 01/03/2020  . Sepsis (HCC) 01/02/2020  . Type 2 diabetes mellitus with hypoglycemia without coma (HCC) 01/02/2020  . AKI (acute kidney injury) (HCC) 01/01/2020  . Hyperlipidemia   . Essential hypertension   . Depression with anxiety   . Chronic obstructive pulmonary disease (HCC)   . Tobacco abuse 10/21/2019  . Sensory disturbance 10/21/2019  . TIA (transient ischemic attack) 10/20/2019  . Altered mental status   . Loss of consciousness (HCC) 09/08/2018  . Encephalopathy 09/08/2018  . 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 Anxiety 04/02/2017  . Urinary incontinence 01/25/2016  . Fecal incontinence 01/25/2016  . Dysthymia 01/25/2016    Orientation RESPIRATION BLADDER Height & Weight     Self, Place  Normal Continent Weight: 130 lb 8.2 oz (59.2 kg) Height:  5\' 8"  (172.7 cm)  BEHAVIORAL SYMPTOMS/MOOD NEUROLOGICAL BOWEL NUTRITION STATUS      Continent Diet (Heart healthy/carb modified)  AMBULATORY STATUS COMMUNICATION OF NEEDS Skin    Limited Assist Verbally PU Stage and Appropriate Care   PU Stage 2 Dressing: BID                   Personal Care Assistance Level of Assistance  Bathing, Feeding, Dressing Bathing Assistance: Limited assistance Feeding assistance: Independent Dressing Assistance: Limited assistance     Functional Limitations Info  Sight, Hearing, Speech Sight Info: Adequate Hearing Info: Adequate Speech Info: Adequate    SPECIAL CARE FACTORS FREQUENCY                       Contractures Contractures Info: Not present    Additional Factors Info  Code Status, Allergies, Psychotropic, Insulin Sliding Scale Code Status Info: Full Allergies Info: Latex Psychotropic Info: BuSpar (buspirone); Desyrel (trazadone) Insulin Sliding Scale Info: See discharge summary       Current Medications (01/07/2020):  This is the current hospital active medication list Current Facility-Administered Medications  Medication Dose Route Frequency Provider Last Rate Last Admin  . acetaminophen (TYLENOL) tablet 650 mg  650 mg Oral Q6H PRN 01/09/2020, MD   650 mg at 01/04/20 1944   Or  . acetaminophen (TYLENOL) suppository 650 mg  650 mg Rectal Q6H PRN 01/06/20, MD      . albuterol (PROVENTIL) (2.5 MG/3ML) 0.083% nebulizer solution 2.5 mg  2.5 mg Nebulization Q4H PRN Standley Brooking, MD      . amLODipine (NORVASC) tablet 5 mg  5 mg Oral q morning - 10a Standley Brooking, MD  5 mg at 01/07/20 0954  . aspirin chewable tablet 81 mg  81 mg Oral Daily Standley Brooking, MD   81 mg at 01/07/20 0954  . atorvastatin (LIPITOR) tablet 40 mg  40 mg Oral Q2000 Standley Brooking, MD   40 mg at 01/06/20 1956  . busPIRone (BUSPAR) tablet 10 mg  10 mg Oral TID Standley Brooking, MD   10 mg at 01/07/20 0954  . cephALEXin (KEFLEX) capsule 500 mg  500 mg Oral Q12H Standley Brooking, MD   500 mg at 01/07/20 0954  . chlorhexidine (PERIDEX) 0.12 % solution 15 mL  15 mL Mouth Rinse BID Standley Brooking, MD   15 mL at 01/07/20 0954  . Chlorhexidine Gluconate Cloth 2 % PADS 6 each  6 each Topical Daily Standley Brooking, MD   6 each at 01/07/20 [phone]  . clopidogrel (PLAVIX) tablet 75 mg  75 mg Oral Q breakfast Standley Brooking, MD   75 mg at 01/07/20 0954  . ferrous sulfate tablet 325 mg  325 mg Oral Q breakfast Standley Brooking, MD   325 mg at 01/07/20 0954  . folic acid (FOLVITE) tablet 1 mg  1 mg Oral Daily Standley Brooking, MD   1 mg at 01/07/20 0954  . heparin injection 5,000 Units  5,000 Units Subcutaneous Q8H Standley Brooking, MD   5,000 Units at 01/07/20 [phone]  . HYDROcodone-acetaminophen (NORCO/VICODIN) 5-325 MG per tablet 1-2 tablet  1-2 tablet Oral Q6H PRN Standley Brooking, MD      . MEDLINE mouth rinse  15 mL Mouth Rinse q12n4p Standley Brooking, MD   15 mL at 01/06/20 1831  . megestrol (MEGACE) tablet 40 mg  40 mg Oral Daily Standley Brooking, MD   40 mg at 01/06/20 0830  . mometasone-formoterol (DULERA) 100-5 MCG/ACT inhaler 2 puff  2 puff Inhalation BID Standley Brooking, MD   2 puff at 01/07/20 0846  . nystatin cream (MYCOSTATIN)   Topical BID Standley Brooking, MD   Given at 01/07/20 [phone]  . ondansetron (ZOFRAN) tablet 4 mg  4 mg Oral Q6H PRN Standley Brooking, MD       Or  . ondansetron [facility]) injection 4 mg  4 mg Intravenous Q6H PRN Standley Brooking, MD      . Resource ThickenUp Clear   Oral PRN Standley Brooking, MD      . sodium bicarbonate tablet 650 mg  650 mg Oral TID Standley Brooking, MD   650 mg at 01/07/20 0954  . traZODone (DESYREL) tablet 100 mg  100 mg Oral QHS Standley Brooking, MD   100 mg at 01/06/20 2205  . vitamin B-12 (CYANOCOBALAMIN) tablet 1,000 mcg  1,000 mcg Oral Daily Standley Brooking, MD   1,000 mcg at 01/07/20 0954  . Vitamin D3 (Vitamin D) tablet 1,000 Units  1,000 Units Oral Daily Standley Brooking, MD   1,000 Units at 01/07/20 8182     Discharge Medications:  STOP taking these medications        HYDROcodone-acetaminophen 5-325 MG tablet Commonly known as: NORCO/VICODIN   lisinopril 20 MG tablet Commonly known as: ZESTRIL   metFORMIN 500 MG 24 hr tablet Commonly known as: GLUCOPHAGE-XR             TAKE these medications       albuterol 108 (90 Base) MCG/ACT inhaler Commonly known as: VENTOLIN HFA Inhale 2 puffs into the lungs  every 4 (four) hours as needed for wheezing or shortness of breath.   amLODipine 5 MG tablet Commonly known as: NORVASC Take 1 tablet (5 mg total) by mouth every morning. Start taking on: January 08, 2020 What changed:   medication strength  how much to take   aspirin EC 81 MG tablet Take 1 tablet (81 mg total) by mouth daily. Swallow whole.   atorvastatin 40 MG tablet Commonly known as: LIPITOR Take 40 mg by mouth daily at 8 pm. (2000)   busPIRone 10 MG tablet Commonly known as: BUSPAR Take 10 mg by mouth 3 (three) times daily.   cephALEXin 500 MG capsule Commonly known as: KEFLEX Take 1 capsule (500 mg total) by mouth every 12 (twelve) hours for 1 day.   cholecalciferol 25 MCG (1000 UNIT) tablet Commonly known as: VITAMIN D3 Take 1,000 Units by mouth every morning.   cholecalciferol 25 MCG (1000 UNIT) tablet Commonly known as: VITAMIN D Take 1 tablet (1,000 Units total) by mouth daily. (0800)   clopidogrel 75 MG tablet Commonly known as: PLAVIX Take 1 tablet (75 mg total) by mouth daily with breakfast.   Dexlansoprazole 30 MG capsule Take 30 mg by mouth daily.   diclofenac sodium 1 % Gel Commonly known as: VOLTAREN Apply 2 g topically 4 (four) times daily as needed. What changed: when to take this   docusate sodium 100 MG capsule Commonly known as: COLACE Take 1 capsule (100 mg total) by mouth 2 (two) times daily.   ferrous sulfate 325 (65 FE) MG tablet Take 1 tablet (325 mg total) by mouth 2 (two) times daily with a meal. What changed: when to take this   Fluticasone-Salmeterol 100-50 MCG/DOSE  Aepb Commonly known as: ADVAIR Inhale 1 puff into the lungs 2 (two) times daily.   folic acid 1 MG tablet Commonly known as: FOLVITE Take 1 mg by mouth daily.   furosemide 20 MG tablet Commonly known as: LASIX Take 1 tablet (20 mg total) by mouth 2 (two) times daily. (0800) What changed: how much to take   megestrol 40 MG tablet Commonly known as: MEGACE Take 40 mg by mouth daily.   nicotine 21 mg/24hr patch Commonly known as: NICODERM CQ - dosed in mg/24 hours Place 1 patch (21 mg total) onto the skin daily.   pantoprazole 40 MG tablet Commonly known as: PROTONIX Take 1 tablet (40 mg total) by mouth daily.   sodium bicarbonate 650 MG tablet Take 1 tablet (650 mg total) by mouth 3 (three) times daily.   traMADol 50 MG tablet Commonly known as: ULTRAM Take 50 mg by mouth 4 (four) times daily.   traZODone 100 MG tablet Commonly known as: DESYREL Take 100 mg by mouth at bedtime.   vitamin B-12 1000 MCG tablet Commonly known as: CYANOCOBALAMIN Take 1,000 mcg by mouth daily.                    Relevant Imaging Results:  Relevant Lab Results:   Additional Information Home Health RN for wound care teaching. [facility] will follow.  Elliot Gault, LCSW

## 2020-01-07 NOTE — TOC Transition Note (Signed)
Transition of Care [facility]) - CM/SW Discharge Note   Patient Details  Name: [name] [name] MRN: [id number] Date of Birth: 1966/02/01  [facility]) CM/SW Contact:  Elliot Gault, LCSW Phone Number: 01/07/2020, 1:11 PM   Clinical Narrative:     Pt stable for dc back to her group home today per MD. Pt will need HHRN for wound care. Spoke with Jimmye Norman from [facility] group home to update. Per XTKWIOX, pt can return today. She will need EMS transport. [name] states that if [facility] RN can show her the wound care, then they can manage after that.   Spoke with [facility] DSS guardian to update on above. Guardian in agreement with dc plan. DC summary faxed to guardian at their request. PCP is Lorrin Mais, NP per guardian. TOC arranged follow up appointment with Lorrin Mais at attending MD request. Appointment information added to pt's AVS and dc summary faxed to the PCP office.   HH arranged with [facility]. Pt's PCP aware and will sign orders. Asked RN to send pt home with some dressing supplies. EMS form printed to the floor and EMS contacted for transport.  There are no other TOC needs for dc.  Final next level of care: Home w Home Health Services Barriers to Discharge: Barriers Resolved   Patient Goals and CMS Choice Patient states their goals for this hospitalization and ongoing recovery are:: Return to [facility]   Choice offered to / list presented to : NA  Discharge Placement                       Discharge Plan and Services In-house Referral: Clinical Social Work Discharge Planning Services: Follow-up appt scheduled Post Acute Care Choice: NA          DME Arranged: N/A DME Agency: NA       HH Arranged: RN HH Agency: [facility] Date HH Agency Contacted: 01/07/20   Representative spoke with at [facility] Agency: Bonita Quin  Social Determinants of Health (SDOH) Interventions     Readmission Risk Interventions Readmission Risk  Prevention Plan 01/02/2020 10/04/2017  Post Dischage Appt - Complete  Medication Screening - Complete  Transportation Screening Complete Complete  PCP follow-up - Complete  HRI or Home Care Consult Complete -  Social Work Consult for Recovery Care Planning/Counseling Complete -  Palliative Care Screening Not Applicable -  Medication Review Ocean[employer]) Complete -  Some recent data might be hidden

## 2020-02-18 ENCOUNTER — Emergency Department (HOSPITAL_COMMUNITY)
Admission: EM | Admit: 2020-02-18 | Discharge: 2020-02-19 | Disposition: A | Discharge status: Home / Self Care | Payer: Medicaid Other | Attending: Emergency Medicine | Admitting: Emergency Medicine

## 2020-02-18 ENCOUNTER — Other Ambulatory Visit: Payer: Self-pay

## 2020-02-18 ENCOUNTER — Encounter (HOSPITAL_COMMUNITY): Payer: Self-pay | Admitting: *Deleted

## 2020-02-18 DIAGNOSIS — R609 Edema, unspecified: Secondary | ICD-10-CM | POA: Insufficient documentation

## 2020-02-18 DIAGNOSIS — Z20822 Contact with and (suspected) exposure to covid-19: Secondary | ICD-10-CM | POA: Insufficient documentation

## 2020-02-18 DIAGNOSIS — F4321 Adjustment disorder with depressed mood: Secondary | ICD-10-CM | POA: Diagnosis not present

## 2020-02-18 DIAGNOSIS — Z96651 Presence of right artificial knee joint: Secondary | ICD-10-CM | POA: Insufficient documentation

## 2020-02-18 DIAGNOSIS — Z91040 Latex allergy status: Secondary | ICD-10-CM | POA: Insufficient documentation

## 2020-02-18 DIAGNOSIS — E11649 Type 2 diabetes mellitus with hypoglycemia without coma: Secondary | ICD-10-CM | POA: Insufficient documentation

## 2020-02-18 DIAGNOSIS — F17210 Nicotine dependence, cigarettes, uncomplicated: Secondary | ICD-10-CM | POA: Insufficient documentation

## 2020-02-18 DIAGNOSIS — D631 Anemia in chronic kidney disease: Secondary | ICD-10-CM | POA: Diagnosis not present

## 2020-02-18 DIAGNOSIS — Z634 Disappearance and death of family member: Secondary | ICD-10-CM

## 2020-02-18 DIAGNOSIS — I129 Hypertensive chronic kidney disease with stage 1 through stage 4 chronic kidney disease, or unspecified chronic kidney disease: Secondary | ICD-10-CM | POA: Diagnosis not present

## 2020-02-18 DIAGNOSIS — Z7951 Long term (current) use of inhaled steroids: Secondary | ICD-10-CM | POA: Diagnosis not present

## 2020-02-18 DIAGNOSIS — N189 Chronic kidney disease, unspecified: Secondary | ICD-10-CM | POA: Insufficient documentation

## 2020-02-18 DIAGNOSIS — R627 Adult failure to thrive: Secondary | ICD-10-CM | POA: Diagnosis present

## 2020-02-18 DIAGNOSIS — Z7982 Long term (current) use of aspirin: Secondary | ICD-10-CM | POA: Diagnosis not present

## 2020-02-18 DIAGNOSIS — Z8673 Personal history of transient ischemic attack (TIA), and cerebral infarction without residual deficits: Secondary | ICD-10-CM | POA: Diagnosis not present

## 2020-02-18 DIAGNOSIS — J449 Chronic obstructive pulmonary disease, unspecified: Secondary | ICD-10-CM | POA: Insufficient documentation

## 2020-02-18 DIAGNOSIS — Z79899 Other long term (current) drug therapy: Secondary | ICD-10-CM | POA: Insufficient documentation

## 2020-02-18 DIAGNOSIS — Z23 Encounter for immunization: Secondary | ICD-10-CM | POA: Insufficient documentation

## 2020-02-18 LAB — COMPREHENSIVE METABOLIC PANEL
ALT: 19 U/L (ref 0–44)
AST: 27 U/L (ref 15–41)
Albumin: 2.9 g/dL — ABNORMAL LOW (ref 3.5–5.0)
Alkaline Phosphatase: 44 U/L (ref 38–126)
Anion gap: 10 (ref 5–15)
BUN: 16 mg/dL (ref 6–20)
CO2: 26 mmol/L (ref 22–32)
Calcium: 8.6 mg/dL — ABNORMAL LOW (ref 8.9–10.3)
Chloride: 101 mmol/L (ref 98–111)
Creatinine, Ser: 1.24 mg/dL — ABNORMAL HIGH (ref 0.44–1.00)
GFR, Estimated: 52 mL/min — ABNORMAL LOW (ref >60)
Glucose, Bld: 86 mg/dL (ref 70–99)
Potassium: 3.8 mmol/L (ref 3.5–5.1)
Sodium: 137 mmol/L (ref 135–145)
Total Bilirubin: 0.4 mg/dL (ref 0.3–1.2)
Total Protein: 7.1 g/dL (ref 6.5–8.1)

## 2020-02-18 LAB — CBC WITH DIFFERENTIAL/PLATELET
Abs Immature Granulocytes: 0.01 10*3/uL (ref 0.00–0.07)
Basophils Absolute: 0 10*3/uL (ref 0.0–0.1)
Basophils Relative: 0 %
Eosinophils Absolute: 0.2 10*3/uL (ref 0.0–0.5)
Eosinophils Relative: 3 %
HCT: 29.3 % — ABNORMAL LOW (ref 36.0–46.0)
Hemoglobin: 9.1 g/dL — ABNORMAL LOW (ref 12.0–15.0)
Immature Granulocytes: 0 %
Lymphocytes Relative: 30 %
Lymphs Abs: 1.8 10*3/uL (ref 0.7–4.0)
MCH: 30 pg (ref 26.0–34.0)
MCHC: 31.1 g/dL (ref 30.0–36.0)
MCV: 96.7 fL (ref 80.0–100.0)
Monocytes Absolute: 0.5 10*3/uL (ref 0.1–1.0)
Monocytes Relative: 9 %
Neutro Abs: 3.3 10*3/uL (ref 1.7–7.7)
Neutrophils Relative %: 58 %
Platelets: 528 10*3/uL — ABNORMAL HIGH (ref 150–400)
RBC: 3.03 MIL/uL — ABNORMAL LOW (ref 3.87–5.11)
RDW: 11.9 % (ref 11.5–15.5)
WBC: 5.8 10*3/uL (ref 4.0–10.5)
nRBC: 0 % (ref 0.0–0.2)

## 2020-02-18 LAB — CK: Total CK: 527 U/L — ABNORMAL HIGH (ref 38–234)

## 2020-02-18 LAB — MAGNESIUM: Magnesium: 2.1 mg/dL (ref 1.7–2.4)

## 2020-02-18 MED ORDER — GUAIFENESIN 100 MG/5ML PO SOLN
5.0000 mL | Freq: Three times a day (TID) | ORAL | Status: DC
  Administered 2020-02-18 – 2020-02-19 (×2): 100 mg via ORAL
  Filled 2020-02-18 (×2): qty 5

## 2020-02-18 MED ORDER — TRAZODONE HCL 50 MG PO TABS
100.0000 mg | ORAL_TABLET | Freq: Every day | ORAL | Status: DC
  Administered 2020-02-18: 21:00:00 100 mg via ORAL
  Filled 2020-02-18: qty 2

## 2020-02-18 MED ORDER — ONDANSETRON HCL 4 MG PO TABS: 4.0000 mg | ORAL_TABLET | Freq: Three times a day (TID) | ORAL | Status: DC | PRN

## 2020-02-18 MED ORDER — ACETAMINOPHEN ER 650 MG PO TBCR: 650.0000 mg | EXTENDED_RELEASE_TABLET | Freq: Three times a day (TID) | ORAL | Status: DC | PRN

## 2020-02-18 MED ORDER — FOLIC ACID 1 MG PO TABS: 1.0000 mg | ORAL_TABLET | Freq: Every day | ORAL | Status: DC

## 2020-02-18 MED ORDER — CLOPIDOGREL BISULFATE 75 MG PO TABS
75.0000 mg | ORAL_TABLET | Freq: Every day | ORAL | Status: DC
  Administered 2020-02-19: 11:00:00 75 mg via ORAL
  Filled 2020-02-18: qty 1

## 2020-02-18 MED ORDER — SODIUM CHLORIDE 0.9 % IV BOLUS
1000.0000 mL | Freq: Once | INTRAVENOUS | Status: AC
  Administered 2020-02-18: 18:00:00 1000 mL via INTRAVENOUS

## 2020-02-18 MED ORDER — TETANUS-DIPHTH-ACELL PERTUSSIS 5-2.5-18.5 LF-MCG/0.5 IM SUSY
0.5000 mL | PREFILLED_SYRINGE | Freq: Once | INTRAMUSCULAR | Status: AC
  Administered 2020-02-18: 21:00:00 0.5 mL via INTRAMUSCULAR
  Filled 2020-02-18: qty 0.5

## 2020-02-18 MED ORDER — VITAMIN D3 25 MCG (1000 UNIT) PO TABS: 1000.0000 [IU] | ORAL_TABLET | Freq: Every day | ORAL | Status: DC

## 2020-02-18 MED ORDER — DOCUSATE SODIUM 100 MG PO CAPS: 100.0000 mg | ORAL_CAPSULE | Freq: Two times a day (BID) | ORAL | Status: DC

## 2020-02-18 MED ORDER — PANTOPRAZOLE SODIUM 40 MG PO TBEC
40.0000 mg | DELAYED_RELEASE_TABLET | Freq: Every day | ORAL | Status: DC
  Administered 2020-02-19: 11:00:00 40 mg via ORAL
  Filled 2020-02-18: qty 1

## 2020-02-18 MED ORDER — ASPIRIN EC 81 MG PO TBEC
81.0000 mg | DELAYED_RELEASE_TABLET | Freq: Every day | ORAL | Status: DC
Start: 2020-02-19 — End: 2020-02-20
  Administered 2020-02-19: 11:00:00 81 mg via ORAL
  Filled 2020-02-18: qty 1

## 2020-02-18 MED ORDER — ASPIRIN EC 81 MG PO TBEC: 81.0000 mg | DELAYED_RELEASE_TABLET | Freq: Every day | ORAL | Status: DC

## 2020-02-18 MED ORDER — FERROUS SULFATE 325 (65 FE) MG PO TABS
325.0000 mg | ORAL_TABLET | Freq: Two times a day (BID) | ORAL | Status: DC
  Administered 2020-02-19: 11:00:00 325 mg via ORAL
  Filled 2020-02-18: qty 1

## 2020-02-18 MED ORDER — FOLIC ACID 1 MG PO TABS
1.0000 mg | ORAL_TABLET | Freq: Every day | ORAL | Status: DC
  Administered 2020-02-19: 11:00:00 1 mg via ORAL
  Filled 2020-02-18: qty 1

## 2020-02-18 MED ORDER — BACITRACIN ZINC 500 UNIT/GM EX OINT
TOPICAL_OINTMENT | Freq: Once | CUTANEOUS | Status: AC
  Filled 2020-02-18: qty 0.9

## 2020-02-18 MED ORDER — ZOLPIDEM TARTRATE 5 MG PO TABS: 5.0000 mg | ORAL_TABLET | Freq: Every evening | ORAL | Status: DC | PRN

## 2020-02-18 MED ORDER — PANTOPRAZOLE SODIUM 40 MG PO TBEC: 40.0000 mg | DELAYED_RELEASE_TABLET | Freq: Every day | ORAL | Status: DC

## 2020-02-18 MED ORDER — ACETAMINOPHEN 325 MG PO TABS: 650.0000 mg | ORAL_TABLET | Freq: Three times a day (TID) | ORAL | Status: DC | PRN

## 2020-02-18 MED ORDER — ALBUTEROL SULFATE HFA 108 (90 BASE) MCG/ACT IN AERS: 2.0000 | INHALATION_SPRAY | RESPIRATORY_TRACT | Status: DC | PRN

## 2020-02-18 MED ORDER — ATORVASTATIN CALCIUM 40 MG PO TABS
40.0000 mg | ORAL_TABLET | Freq: Every day | ORAL | Status: DC
  Administered 2020-02-18: 21:00:00 40 mg via ORAL
  Filled 2020-02-18: qty 1

## 2020-02-18 MED ORDER — BUSPIRONE HCL 5 MG PO TABS
10.0000 mg | ORAL_TABLET | Freq: Three times a day (TID) | ORAL | Status: DC
  Administered 2020-02-18 – 2020-02-19 (×2): 10 mg via ORAL
  Filled 2020-02-18 (×2): qty 2

## 2020-02-18 MED ORDER — ZOLPIDEM TARTRATE 5 MG PO TABS: 10.0000 mg | ORAL_TABLET | Freq: Every evening | ORAL | Status: DC | PRN

## 2020-02-18 MED ORDER — AMLODIPINE BESYLATE 5 MG PO TABS
5.0000 mg | ORAL_TABLET | Freq: Every morning | ORAL | Status: DC
  Administered 2020-02-19: 11:00:00 5 mg via ORAL
  Filled 2020-02-18: qty 1

## 2020-02-18 NOTE — ED Provider Notes (Addendum)
[facility] EMERGENCY DEPARTMENT Provider Note   CSN: [id number] Arrival date & time: 02/18/20  1344     History No chief complaint on file.   [name] [name] is a 54 y.o. female.  HPI    54 year old female comes in with chief complaint of failure to thrive.  I spoke with [name] [name] at the group home where patient resides.  She provides the most history that is pertinent to this visit.  Unfortunately, patient lost her 46 year old son around 94 December.  Patient was notified of this by her father signs on 20 December.  Patient has been grieving since then.  [name] [name] notes that she would find the patient on the floor every morning.  Patient has been more removed.  She has been crying a lot.  More recently she has noted that patient has injured herself  /self harmed her in the legs with parts of furniture.  Patient has no complaints from her side.  She does indicate that she is grieving.  She does indicate that she has not been taking great care of herself.  She does indicate that the swelling in her leg is worse.  She denies any nausea, vomiting, fevers, chills.  She has no suicidal ideation.   Past Medical History:  Diagnosis Date  . Acute cystitis   . Anxiety   . Arthritis   . CVA (cerebral vascular accident) (HCC)   . Depression   . Diabetes mellitus without complication (HCC)   . Dyspnea   . Fecal incontinence   . Hypertension   . Incontinence   . Incontinence of urine   . Stroke (HCC) 2004   x 2  . Urinary incontinence     Patient Active Problem List   Diagnosis Date Noted  . Acute lower UTI 01/04/2020  . Pressure injury of skin 01/03/2020  . Dysphagia 01/03/2020  . Hypocalcemia 01/03/2020  . Anemia in chronic kidney disease (CKD) 01/03/2020  . Sepsis (HCC) 01/02/2020  . Type 2 diabetes mellitus with hypoglycemia without coma (HCC) 01/02/2020  . AKI (acute kidney injury) (HCC) 01/01/2020  . Hyperlipidemia   . Essential hypertension   . Depression with  anxiety   . Chronic obstructive pulmonary disease (HCC)   . Tobacco abuse 10/21/2019  . Sensory disturbance 10/21/2019  . TIA (transient ischemic attack) 10/20/2019  . Altered mental status   . Loss of consciousness (HCC) 09/08/2018  . Encephalopathy 09/08/2018  . 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 Anxiety 04/02/2017  . Urinary incontinence 01/25/2016  . Fecal incontinence 01/25/2016  . Dysthymia 01/25/2016    Past Surgical History:  Procedure Laterality Date  . COLONOSCOPY WITH PROPOFOL N/A 11/27/2018   Procedure: COLONOSCOPY WITH PROPOFOL;  Surgeon: Toledo, Boykin Nearing, MD;  Location: [facility] ENDOSCOPY;  Service: Gastroenterology;  Laterality: N/A;  . ESOPHAGOGASTRODUODENOSCOPY (EGD) WITH PROPOFOL N/A 11/27/2018   Procedure: ESOPHAGOGASTRODUODENOSCOPY (EGD) WITH PROPOFOL;  Surgeon: Toledo, Boykin Nearing, MD;  Location: [facility] ENDOSCOPY;  Service: Gastroenterology;  Laterality: N/A;  . NO PAST SURGERIES    . TOTAL KNEE ARTHROPLASTY Right 10/03/2017   Procedure: TOTAL KNEE ARTHROPLASTY;  Surgeon: Lyndle Herrlich, MD;  Location: [facility] ORS;  Service: Orthopedics;  Laterality: Right;     OB History   No obstetric history on file.     Family History  Problem Relation Age of Onset  . Heart disease Mother   . Heart disease Father     Social History   Tobacco Use  .  Smoking status: Current Every Day Smoker    Types: Cigarettes  . Smokeless tobacco: Never Used  Vaping Use  . Vaping Use: Never used  Substance Use Topics  . Alcohol use: No  . Drug use: No    Home Medications Prior to Admission medications   Medication Sig Start Date End Date Taking? Authorizing Provider  acetaminophen (TYLENOL) 650 MG CR tablet Take 650 mg by mouth every 8 (eight) hours as needed for pain.   Yes [provider]  albuterol (PROVENTIL HFA;VENTOLIN HFA) 108 (90 Base) MCG/ACT inhaler Inhale 2 puffs into the lungs every 4 (four) hours as needed for wheezing or  shortness of breath.   Yes [provider]  amLODipine (NORVASC) 5 MG tablet Take 1 tablet (5 mg total) by mouth every morning. 01/08/20 02/07/20 Yes Shah, Pratik D, DO  aspirin EC 81 MG tablet Take 1 tablet (81 mg total) by mouth daily. Swallow whole. 10/22/19 10/21/20 Yes Vassie Loll, MD  atorvastatin (LIPITOR) 40 MG tablet Take 40 mg by mouth at bedtime. (2000)   Yes [provider]  busPIRone (BUSPAR) 10 MG tablet Take 10 mg by mouth 3 (three) times daily.   Yes [provider]  cholecalciferol (VITAMIN D3) 25 MCG (1000 UT) tablet Take 1,000 Units by mouth every morning.   Yes [provider]  clopidogrel (PLAVIX) 75 MG tablet Take 1 tablet (75 mg total) by mouth daily with breakfast. 10/23/19  Yes Vassie Loll, MD  Dexlansoprazole 30 MG capsule Take 30 mg by mouth daily.   Yes [provider]  diclofenac sodium (VOLTAREN) 1 % GEL Apply 2 g topically 4 (four) times daily as needed. Patient taking differently: Apply 2 g topically in the morning and at bedtime. 09/13/18  Yes Briant Cedar, MD  ferrous sulfate 325 (65 FE) MG tablet Take 1 tablet (325 mg total) by mouth 2 (two) times daily with a meal. Patient taking differently: Take 325 mg by mouth daily with breakfast. 09/13/18 01/01/20 Yes Briant Cedar, MD  Fluticasone-Salmeterol (ADVAIR) 100-50 MCG/DOSE AEPB Inhale 1 puff into the lungs 2 (two) times daily.   Yes [provider]  folic acid (FOLVITE) 1 MG tablet Take 1 mg by mouth daily.   Yes [provider]  furosemide (LASIX) 20 MG tablet Take 1 tablet (20 mg total) by mouth 2 (two) times daily. (0800) Patient taking differently: Take 30 mg by mouth 2 (two) times daily. (0800) 10/22/19  Yes Vassie Loll, MD  guaiFENesin (ROBITUSSIN) 100 MG/5ML SOLN Take 5 mLs by mouth 3 (three) times daily.   Yes [provider]  LANTUS SOLOSTAR 100 UNIT/ML Solostar Pen Inject 12 Units into the skin at bedtime. 01/13/20  Yes  [provider]  LORazepam (ATIVAN) 1 MG tablet Take 1 mg by mouth 2 (two) times daily as needed for anxiety. 02/09/20  Yes [provider]  megestrol (MEGACE) 40 MG tablet Take 40 mg by mouth daily.   Yes [provider]  sodium bicarbonate 650 MG tablet Take 650 mg by mouth daily. 02/17/20  Yes [provider]  traMADol (ULTRAM) 50 MG tablet Take 50 mg by mouth 4 (four) times daily.  09/30/19  Yes [provider]  traZODone (DESYREL) 100 MG tablet Take 100 mg by mouth at bedtime.   Yes [provider]  vitamin B-12 (CYANOCOBALAMIN) 1000 MCG tablet Take 1,000 mcg by mouth daily.   Yes [provider]  zolpidem (AMBIEN) 10 MG tablet Take 10 mg  by mouth at bedtime as needed. 02/09/20  Yes [provider]  azithromycin (ZITHROMAX) 250 MG tablet Take 250 mg by mouth as directed. Patient not taking: No sig reported 02/09/20   [provider]  cephALEXin (KEFLEX) 500 MG capsule Take by mouth.    [provider]  cholecalciferol (VITAMIN D) 25 MCG (1000 UNIT) tablet Take 1 tablet (1,000 Units total) by mouth daily. (0800) Patient not taking: Reported on 01/01/2020 10/22/19   Vassie Loll, MD  docusate sodium (COLACE) 100 MG capsule Take 1 capsule (100 mg total) by mouth 2 (two) times daily. Patient not taking: Reported on 01/01/2020 10/22/19   Vassie Loll, MD  nicotine (NICODERM CQ - DOSED IN MG/24 HOURS) 21 mg/24hr patch Place 1 patch (21 mg total) onto the skin daily. Patient not taking: No sig reported 10/23/19   Vassie Loll, MD  pantoprazole (PROTONIX) 40 MG tablet Take 1 tablet (40 mg total) by mouth daily. 09/14/18 10/20/19  Briant Cedar, MD    Allergies    Latex  Review of Systems   Review of Systems  Constitutional: Positive for activity change.  Respiratory: Negative for shortness of breath.   Cardiovascular: Negative for chest pain.  Allergic/Immunologic: Negative for immunocompromised  state.  Hematological: Does not bruise/bleed easily.  Psychiatric/Behavioral: Positive for self-injury.  All other systems reviewed and are negative.   Physical Exam Updated Vital Signs BP (!) 157/104   Pulse 87   Temp 98.3 F (36.8 C) (Axillary)   Resp 12   SpO2 100%   Physical Exam Vitals and nursing note reviewed.  Constitutional:      Appearance: She is well-developed and well-nourished.  HENT:     Head: Normocephalic and atraumatic.  Eyes:     Extraocular Movements: EOM normal.     Pupils: Pupils are equal, round, and reactive to light.  Cardiovascular:     Rate and Rhythm: Normal rate and regular rhythm.     Heart sounds: Normal heart sounds. No murmur heard.   Pulmonary:     Effort: Pulmonary effort is normal. No respiratory distress.  Abdominal:     General: There is no distension.     Palpations: Abdomen is soft.     Tenderness: There is no abdominal tenderness. There is no guarding or rebound.  Musculoskeletal:     Cervical back: Neck supple.     Right lower leg: Edema present.  Skin:    General: Skin is warm and dry.     Comments: Patient has multiple pressure ulcers over the gluteal region and bilateral lower extremity.  Over the right gluteal region she has stage II ulcer. The skin around the lower abdomen, pelvis is excoriated/erythematous  Neurological:     Mental Status: She is alert and oriented to person, place, and time.     ED Results / Procedures / Treatments   Labs (all labs ordered are listed, but only abnormal results are displayed) Labs Reviewed  COMPREHENSIVE METABOLIC PANEL - Abnormal; Notable for the following components:      Result Value   Creatinine, Ser 1.24 (*)    Calcium 8.6 (*)    Albumin 2.9 (*)    GFR, Estimated 52 (*)    All other components within normal limits  CBC WITH DIFFERENTIAL/PLATELET - Abnormal; Notable for the following components:   RBC 3.03 (*)    Hemoglobin 9.1 (*)    HCT 29.3 (*)    Platelets 528 (*)     All other components within  normal limits  CK - Abnormal; Notable for the following components:   Total CK 527 (*)    All other components within normal limits  SARS CORONAVIRUS 2 (TAT 6-24 HRS)  MAGNESIUM    EKG None  Radiology No results found.  Procedures Procedures (including critical care time)  Medications Ordered in ED Medications  sodium chloride 0.9 % bolus 1,000 mL (1,000 mLs Intravenous New Bag/Given 02/18/20 1814)    ED Course  I have reviewed the triage vital signs and the nursing notes.  Pertinent labs & imaging results that were available during my care of the patient were reviewed by me and considered in my medical decision making (see chart for details).  Clinical Course as of 02/18/20 2032  Wed Feb 18, 2020  2031 Mildly elevated CK.  Creatinine is normal.  She has been given mild hydration here.  Inconsequential, as long as patient is tolerating orals. [AN]  2031 Patient is medically cleared for psych evaluation. [AN]    Clinical Course User Index [AN] Derwood KaplanNanavati, Morse Brueggemann, MD   MDM Rules/Calculators/A&P                          54 year old female brought into the ER with chief complaint of failure to thrive.  Patient had an unfortunate event of her 54 year old passing away unexpectedly recently.  She is grieving.  It appears that she has been self harming, however patient denies it.  She has no SI.  I think the patient is grieving, but it does not seem like she is coping as well as we would like her to.  There is a negative impact of this news on her health and wellbeing.  We will give her tetanus shot. We will dressed the wound while she is here.  No signs of infection which is reassuring.  We will consult psych for recommendation on medications that can help her grieve better.  Final Clinical Impression(s) / ED Diagnoses Final diagnoses:  None    Rx / DC Orders ED Discharge Orders    None       Derwood KaplanNanavati, Kaniyah Lisby, MD 02/18/20 [phone]

## 2020-02-18 NOTE — ED Triage Notes (Signed)
Pt refused to take meds or eat since death of her son on 2023/02/27.  incont of urine and bowels and pt refuses to have staff clean her.

## 2020-02-19 DIAGNOSIS — F4321 Adjustment disorder with depressed mood: Secondary | ICD-10-CM

## 2020-02-19 LAB — SARS CORONAVIRUS 2 (TAT 6-24 HRS): SARS Coronavirus 2: NEGATIVE

## 2020-02-19 MED ORDER — BUSPIRONE HCL 5 MG PO TABS: 15.0000 mg | ORAL_TABLET | Freq: Three times a day (TID) | ORAL | Status: DC

## 2020-02-19 MED ORDER — BUSPIRONE HCL 15 MG PO TABS: 15.0000 mg | ORAL_TABLET | Freq: Three times a day (TID) | ORAL | 0 refills | Status: DC

## 2020-02-19 NOTE — ED Notes (Signed)
Patient soiled with urine. Patient moved pur wick. Patient cleaned and linen changed. Patient repositioned and given warm blankets.

## 2020-02-19 NOTE — ED Notes (Signed)
Pt on call with TTS 

## 2020-02-19 NOTE — Progress Notes (Signed)
Pt has been psychiatrically cleared. [name] [phone]) at [facility] was notified of disposition. She stated that pt will need to be transported by ambulance and asks that [facility] RN call her before pt is transported.    Wells Guiles, MSW, LCSW, LCAS Clinical Social Worker II Disposition CSW [phone]

## 2020-02-19 NOTE — ED Notes (Signed)
Pt linen, brief and gown changed

## 2020-02-19 NOTE — Discharge Instructions (Addendum)
Psychiatry team has increased her Buspar.  Return to the ER if the symptoms get worse.

## 2020-02-19 NOTE — Consult Note (Signed)
Telepsych Consultation   Location of Patient: [facility] Location of Provider: [facility]  Patient Identification: [name] [name] MRN:  [id number] Principal Diagnosis: Complicated grief Diagnosis:  Principal Problem:   Complicated grief   Total Time spent with patient: 30 minutes  HPI:  Reassessment: Patient seen via telepsych. Chart reviewed. [name] [name] is a 54 year old with no reported psychiatric history who presented to ED from her group home for failure to thrive. Per notes she had not been eating well or taking medications since her 28 year old son passed away unexpectedly two weeks ago, and there was concern that she had self-harmed with her furniture.  On assessment today, [name] [name] seen sitting in bed. She presents with poor eye contact and minimal speech. She admits to depressed mood since her son passed away two weeks ago. Denies prior history of depression. Denies problems with sleep/appetite/energy. Denies nightmares/flashbacks. She admits to feeling anxious and restless. When asked about reports of refusing medications/not eating well, patient states "I don't know." She does express understanding that she needs medications for her health problems and states she can resume medications when she returns home. She denies any SI and denies thoughts of wanting to be dead or hurt herself. Denies self-harm. She states the wound on her leg is from falling on the floor. She states she has been getting out of bed without using her walker and fell due to weakness. She states she will use her walker next time. Denies HI/AVH. She shows no signs of responding to internal stimuli. She is oriented to person and place. States it is January or February of 2020, easily redirected. It appears she has already been taking Buspar. We discussed increasing Buspar and starting an antidepressant, and patient expresses agreement.  Per TTS assessment: [name] [name] is a 54 year old female who  presents voluntary and unaccompanied to [facility]. Clinician asked the pt, "what brought you to the hospital?" Pt reported, "they said I was depressed." Pt reported, her 69 year old son was killed on 02/02/2020, she found out the next day. Pt reported, she is taking care of herself. Pt denies, symptoms of depression/anxiety; SI, HI, AVH self-injurious behaviors and access to weapons.  Clinician contacted [name] [phone]) staff at [facility] to gather additional information.  Per staff, the pt's fourteen year old son was hit by car and died on 2020-02-09 the pt was told by her oldest son on 02/09/2020. Staff reported, the pt has been laying in the floor and refusing baths. Staff reported, she seen the pt poke a drawer fixture on her leg and crying.    Pt denies, substance use. Pt denies, being linked to OPT resources (medication management and/or counseling.)   Pt presents alert with normal speech. Pt's mood, affect was sad. Pt's thought content was appropriate to mood an circumstances. Pt's insight was poor. Pt's judgement was fair. Pt reported, if discharged from [facility] she can contract for safety.   Disposition: Patient shows no evidence of acute risk of harm to self or others and is psych cleared for discharge. I attempted to contact patient's legal guardian Gus Puma [phone] for update and consent to start antidepressant with no response, HIPAA compliant voicemail left. Will increase Buspar to 15 mg TID. CSW to contact group home. ED staff updated.  Past Psychiatric History: None reported  Risk to Self:   Risk to Others:   Prior Inpatient Therapy:   Prior Outpatient Therapy:    Past Medical History:  Past  Medical History:  Diagnosis Date  . Acute cystitis   . Anxiety   . Arthritis   . CVA (cerebral vascular accident) (HCC)   . Depression   . Diabetes mellitus without complication (HCC)   . Dyspnea   . Fecal incontinence   . Hypertension   .  Incontinence   . Incontinence of urine   . Stroke (HCC) 2004   x 2  . Urinary incontinence     Past Surgical History:  Procedure Laterality Date  . COLONOSCOPY WITH PROPOFOL N/A 11/27/2018   Procedure: COLONOSCOPY WITH PROPOFOL;  Surgeon: Toledo, Boykin Nearing, MD;  Location: [facility] ENDOSCOPY;  Service: Gastroenterology;  Laterality: N/A;  . ESOPHAGOGASTRODUODENOSCOPY (EGD) WITH PROPOFOL N/A 11/27/2018   Procedure: ESOPHAGOGASTRODUODENOSCOPY (EGD) WITH PROPOFOL;  Surgeon: Toledo, Boykin Nearing, MD;  Location: [facility] ENDOSCOPY;  Service: Gastroenterology;  Laterality: N/A;  . NO PAST SURGERIES    . TOTAL KNEE ARTHROPLASTY Right 10/03/2017   Procedure: TOTAL KNEE ARTHROPLASTY;  Surgeon: Lyndle Herrlich, MD;  Location: [facility] ORS;  Service: Orthopedics;  Laterality: Right;   Family History:  Family History  Problem Relation Age of Onset  . Heart disease Mother   . Heart disease Father    Family Psychiatric  History: Unknown Social History:  Social History   Substance and Sexual Activity  Alcohol Use No     Social History   Substance and Sexual Activity  Drug Use No    Social History   Socioeconomic History  . Marital status: Single    Spouse name: Not on file  . Number of children: Not on file  . Years of education: Not on file  . Highest education level: Not on file  Occupational History  . Not on file  Tobacco Use  . Smoking status: Current Every Day Smoker    Types: Cigarettes  . Smokeless tobacco: Never Used  Vaping Use  . Vaping Use: Never used  Substance and Sexual Activity  . Alcohol use: No  . Drug use: No  . Sexual activity: Not on file  Other Topics Concern  . Not on file  Social History Narrative   ** Merged History Encounter **       Social Determinants of Health   Financial Resource Strain: Not on file  Food Insecurity: Not on file  Transportation Needs: Not on file  Physical Activity: Not on file  Stress: Not on file  Social Connections: Not on file    Additional Social History:    Allergies:   Allergies  Allergen Reactions  . Latex Rash    Severe itching.    Labs:  Results for orders placed or performed during the hospital encounter of 02/18/20 (from the past 48 hour(s))  SARS CORONAVIRUS 2 (TAT 6-24 HRS) Nasopharyngeal Nasopharyngeal Swab     Status: None   Collection Time: 02/18/20  5:31 PM   Specimen: Nasopharyngeal Swab  Result Value Ref Range   SARS Coronavirus 2 NEGATIVE NEGATIVE    Comment: (NOTE) SARS-CoV-2 target nucleic acids are NOT DETECTED.  The SARS-CoV-2 RNA is generally detectable in upper and lower respiratory specimens during the acute phase of infection. Negative results do not preclude SARS-CoV-2 infection, do not rule out co-infections with other pathogens, and should not be used as the sole basis for treatment or other patient management decisions. Negative results must be combined with clinical observations, patient history, and epidemiological information. The expected result is Negative.  Fact Sheet for Patients: HairSlick.no  Fact Sheet for Healthcare Providers: quierodirigir.com  This test is not yet approved or cleared by the [place] FDA and  has been authorized for detection and/or diagnosis of SARS-CoV-2 by FDA under an Emergency Use Authorization (EUA). This EUA will remain  in effect (meaning this test can be used) for the duration of the COVID-19 declaration under Se ction 564(b)(1) of the Act, 21 U.S.C. section 360bbb-3(b)(1), unless the authorization is terminated or revoked sooner.  Performed at [facility], [address] [place], [place] [address]   Comprehensive metabolic panel     Status: Abnormal   Collection Time: 02/18/20  6:00 PM  Result Value Ref Range   Sodium 137 135 - 145 mmol/L   Potassium 3.8 3.5 - 5.1 mmol/L   Chloride 101 98 - 111 mmol/L   CO2 26 22 - 32 mmol/L   Glucose, Bld 86 70 - 99  mg/dL    Comment: Glucose reference range applies only to samples taken after fasting for at least 8 hours.   BUN 16 6 - 20 mg/dL   Creatinine, Ser 6.041.24 (H) 0.44 - 1.00 mg/dL   Calcium 8.6 (L) 8.9 - 10.3 mg/dL   Total Protein 7.1 6.5 - 8.1 g/dL   Albumin 2.9 (L) 3.5 - 5.0 g/dL   AST 27 15 - 41 U/L   ALT 19 0 - 44 U/L   Alkaline Phosphatase 44 38 - 126 U/L   Total Bilirubin 0.4 0.3 - 1.2 mg/dL   GFR, Estimated 52 (L) >60 mL/min    Comment: (NOTE) Calculated using the CKD-EPI Creatinine Equation (2021)    Anion gap 10 5 - 15    Comment: Performed at [facility], [address] [place], [place] [address]  CBC with Differential     Status: Abnormal   Collection Time: 02/18/20  6:00 PM  Result Value Ref Range   WBC 5.8 4.0 - 10.5 K/uL   RBC 3.03 (L) 3.87 - 5.11 MIL/uL   Hemoglobin 9.1 (L) 12.0 - 15.0 g/dL   HCT 11.929.3 (L) 14.736.0 - 82.946.0 %   MCV 96.7 80.0 - 100.0 fL   MCH 30.0 26.0 - 34.0 pg   MCHC 31.1 30.0 - 36.0 g/dL   RDW 56.211.9 13.011.5 - 86.515.5 %   Platelets 528 (H) 150 - 400 K/uL   nRBC 0.0 0.0 - 0.2 %   Neutrophils Relative % 58 %   Neutro Abs 3.3 1.7 - 7.7 K/uL   Lymphocytes Relative 30 %   Lymphs Abs 1.8 0.7 - 4.0 K/uL   Monocytes Relative 9 %   Monocytes Absolute 0.5 0.1 - 1.0 K/uL   Eosinophils Relative 3 %   Eosinophils Absolute 0.2 0.0 - 0.5 K/uL   Basophils Relative 0 %   Basophils Absolute 0.0 0.0 - 0.1 K/uL   Immature Granulocytes 0 %   Abs Immature Granulocytes 0.01 0.00 - 0.07 K/uL    Comment: Performed at [facility], [address] [place], [place] [address]  Magnesium     Status: None   Collection Time: 02/18/20  6:00 PM  Result Value Ref Range   Magnesium 2.1 1.7 - 2.4 mg/dL    Comment: Performed at [facility], [address] [place], [place] [address]  CK     Status: Abnormal   Collection Time: 02/18/20  6:07 PM  Result Value Ref Range   Total CK 527 (H) 38 - 234 U/L    Comment: Performed at [facility], [address] [place], [place] [address]  Medications:  Current Facility-Administered Medications  Medication Dose Route Frequency Provider Last Rate Last Admin  . acetaminophen (TYLENOL) tablet 650 mg  650 mg Oral Q8H PRN Nanavati, Ankit, MD      . albuterol (VENTOLIN HFA) 108 (90 Base) MCG/ACT inhaler 2 puff  2 puff Inhalation Q4H PRN Nanavati, Ankit, MD      . amLODipine (NORVASC) tablet 5 mg  5 mg Oral q morning - 10a Nanavati, Ankit, MD   5 mg at 02/19/20 1128  . aspirin EC tablet 81 mg  81 mg Oral Daily Nanavati, Ankit, MD   81 mg at 02/19/20 1128  . atorvastatin (LIPITOR) tablet 40 mg  40 mg Oral QHS Derwood Kaplan, MD   40 mg at 02/18/20 2118  . busPIRone (BUSPAR) tablet 10 mg  10 mg Oral TID Derwood Kaplan, MD   10 mg at 02/19/20 1128  . clopidogrel (PLAVIX) tablet 75 mg  75 mg Oral Q breakfast Derwood Kaplan, MD   75 mg at 02/19/20 1129  . ferrous sulfate tablet 325 mg  325 mg Oral BID WC Nanavati, Ankit, MD   325 mg at 02/19/20 1128  . folic acid (FOLVITE) tablet 1 mg  1 mg Oral Daily Nanavati, Ankit, MD   1 mg at 02/19/20 1128  . guaiFENesin (ROBITUSSIN) 100 MG/5ML solution 100 mg  5 mL Oral TID Derwood Kaplan, MD   100 mg at 02/19/20 1128  . ondansetron (ZOFRAN) tablet 4 mg  4 mg Oral Q8H PRN Nanavati, Ankit, MD      . pantoprazole (PROTONIX) EC tablet 40 mg  40 mg Oral Daily Rhunette Croft, Ankit, MD   40 mg at 02/19/20 1128  . traZODone (DESYREL) tablet 100 mg  100 mg Oral QHS Nanavati, Ankit, MD   100 mg at 02/18/20 2117  . zolpidem (AMBIEN) tablet 5 mg  5 mg Oral QHS PRN Derwood Kaplan, MD       Current Outpatient Medications  Medication Sig Dispense Refill  . acetaminophen (TYLENOL) 650 MG CR tablet Take 650 mg by mouth every 8 (eight) hours as needed for pain.    Marland Kitchen albuterol (PROVENTIL HFA;VENTOLIN HFA) 108 (90 Base) MCG/ACT inhaler Inhale 2 puffs into the lungs every 4 (four) hours as needed for wheezing or shortness of breath.    Marland Kitchen amLODipine (NORVASC) 5 MG tablet Take 1 tablet (5 mg total) by mouth every  morning. 30 tablet 2  . aspirin EC 81 MG tablet Take 1 tablet (81 mg total) by mouth daily. Swallow whole. 30 tablet 3  . atorvastatin (LIPITOR) 40 MG tablet Take 40 mg by mouth at bedtime. (2000)    . busPIRone (BUSPAR) 10 MG tablet Take 10 mg by mouth 3 (three) times daily.    . cholecalciferol (VITAMIN D3) 25 MCG (1000 UT) tablet Take 1,000 Units by mouth every morning.    . clopidogrel (PLAVIX) 75 MG tablet Take 1 tablet (75 mg total) by mouth daily with breakfast. 30 tablet 0  . Dexlansoprazole 30 MG capsule Take 30 mg by mouth daily.    . diclofenac sodium (VOLTAREN) 1 % GEL Apply 2 g topically 4 (four) times daily as needed. (Patient taking differently: Apply 2 g topically in the morning and at bedtime.) 50 g 0  . ferrous sulfate 325 (65 FE) MG tablet Take 1 tablet (325 mg total) by mouth 2 (two) times daily with a meal. (Patient taking differently: Take 325 mg by mouth daily with breakfast.) 60 tablet 0  . Fluticasone-Salmeterol (ADVAIR)  100-50 MCG/DOSE AEPB Inhale 1 puff into the lungs 2 (two) times daily.    . folic acid (FOLVITE) 1 MG tablet Take 1 mg by mouth daily.    . furosemide (LASIX) 20 MG tablet Take 1 tablet (20 mg total) by mouth 2 (two) times daily. (0800) (Patient taking differently: Take 30 mg by mouth 2 (two) times daily. (0800))    . guaiFENesin (ROBITUSSIN) 100 MG/5ML SOLN Take 5 mLs by mouth 3 (three) times daily.    Marland Kitchen LANTUS SOLOSTAR 100 UNIT/ML Solostar Pen Inject 12 Units into the skin at bedtime.    Marland Kitchen LORazepam (ATIVAN) 1 MG tablet Take 1 mg by mouth 2 (two) times daily as needed for anxiety.    . megestrol (MEGACE) 40 MG tablet Take 40 mg by mouth daily.    . sodium bicarbonate 650 MG tablet Take 650 mg by mouth daily.    . traMADol (ULTRAM) 50 MG tablet Take 50 mg by mouth 4 (four) times daily.     . traZODone (DESYREL) 100 MG tablet Take 100 mg by mouth at bedtime.    . vitamin B-12 (CYANOCOBALAMIN) 1000 MCG tablet Take 1,000 mcg by mouth daily.    Marland Kitchen zolpidem  (AMBIEN) 10 MG tablet Take 10 mg by mouth at bedtime as needed.    Marland Kitchen azithromycin (ZITHROMAX) 250 MG tablet Take 250 mg by mouth as directed. (Patient not taking: No sig reported)    . cephALEXin (KEFLEX) 500 MG capsule Take by mouth.    . cholecalciferol (VITAMIN D) 25 MCG (1000 UNIT) tablet Take 1 tablet (1,000 Units total) by mouth daily. (0800) (Patient not taking: Reported on 01/01/2020)    . docusate sodium (COLACE) 100 MG capsule Take 1 capsule (100 mg total) by mouth 2 (two) times daily. (Patient not taking: Reported on 01/01/2020) 10 capsule 0  . nicotine (NICODERM CQ - DOSED IN MG/24 HOURS) 21 mg/24hr patch Place 1 patch (21 mg total) onto the skin daily. (Patient not taking: No sig reported) 28 patch 0  . pantoprazole (PROTONIX) 40 MG tablet Take 1 tablet (40 mg total) by mouth daily. 30 tablet 0   Psychiatric Specialty Exam: Physical Exam  Review of Systems  Blood pressure (!) 142/94, pulse 90, temperature 98.3 F (36.8 C), temperature source Axillary, resp. rate (!) 24, SpO2 100 %.There is no height or weight on file to calculate BMI.  General Appearance: Fairly Groomed  Eye Contact:  Poor  Speech:  Slow  Volume:  Decreased  Mood:  Depressed  Affect:  Congruent  Thought Process:  Coherent  Orientation:  Other:  oriented to person and place, states it is January or February 2020  Thought Content:  Logical  Suicidal Thoughts:  No  Homicidal Thoughts:  No  Memory:  Immediate;   Fair Recent;   Fair Remote;   Fair  Judgement:  Fair  Insight:  Fair  Psychomotor Activity:  Decreased  Concentration:  Concentration: Fair and Attention Span: Fair  Recall:  [employer] of Knowledge:  Fair  Language:  Fair  Akathisia:  No  Handed:  Right  AIMS (if indicated):     Assets:  Manufacturing systems engineer Housing Leisure Time Resilience Social Support  ADL's:  Impaired  Cognition:  Impaired,  Mild  Sleep:       Disposition: Patient shows no evidence of acute risk of harm to self or  others and is psych cleared for discharge. I attempted to contact patient's legal guardian Gus Puma [phone] for update  and consent to start antidepressant with no response, HIPAA compliant voicemail left. Will increase Buspar to 15 mg TID. CSW to contact group home. ED staff updated.  This service was provided via telemedicine using a 2-way, interactive audio and video technology with the identified patient and this Clinical research associate.  Aldean Baker, NP 02/19/2020 11:40 AM

## 2020-02-19 NOTE — ED Notes (Signed)
MD made aware pt is psych cleared

## 2020-02-19 NOTE — ED Notes (Signed)
[facility] called for report. Report taking by Stanton Kidney at [facility]. Pt awaiting EMS transport at this time.

## 2020-02-19 NOTE — BH Assessment (Signed)
Comprehensive Clinical Assessment (CCA) Note  02/19/2020 [name] [name] [id number]   [name] [name] is a 54 year old female who presents voluntary and unaccompanied to [facility]. Clinician asked the pt, "what brought you to the hospital?" Pt reported, "they said I was depressed." Pt reported, her 83 year old son was killed on 02/02/2020, she found out the next day. Pt reported, she is taking care of herself. Pt denies, symptoms of depression/anxiety; SI, HI, AVH self-injurious behaviors and access to weapons.  Clinician contacted [name] [name] [phone]) staff at [facility] to gather additional information.  Per staff, the pt's fourteen year old son was hit by car and died on 10-Feb-2020 the pt was told by her oldest son on 02/09/2020. Staff reported, the pt has been laying in the floor and refusing baths. Staff reported, she seen the pt poke a drawer fixture on her leg and crying.    Pt denies, substance use. Pt denies, being linked to OPT resources (medication management and/or counseling.)   Pt presents alert with normal speech. Pt's mood, affect was sad. Pt's thought content was appropriate to mood an circumstances. Pt's insight was poor. Pt's judgement was fair. Pt reported, if discharged from [facility] she can contract for safety.   Disposition: [name] Abts, PA-C recommends pt to be observed pending collateral information from group home owner. Disposition discussed with [name] [name].   Diagnosis: Deferred.  Chief Complaint: No chief complaint on file.  Visit Diagnosis:    CCA Screening, Triage and Referral (STR)  Patient Reported Information How did you hear about [place]? No data recorded Referral name: No data recorded Referral phone number: No data recorded  Whom do you see for routine medical problems? No data recorded Practice/Facility Name: No data recorded Practice/Facility Phone Number: No data recorded Name of Contact: No data recorded Contact Number: No  data recorded Contact Fax Number: No data recorded Prescriber Name: No data recorded Prescriber Address (if known): No data recorded  What Is the Reason for Your Visit/Call Today? No data recorded How Long Has This Been Causing You Problems? No data recorded What Do You Feel Would Help You the Most Today? No data recorded  Have You Recently Been in Any Inpatient Treatment (Hospital/Detox/Crisis Center/28-Day Program)? No data recorded Name/Location of Program/Hospital:No data recorded How Long Were You There? No data recorded When Were You Discharged? No data recorded  Have You Ever Received Services From [facility] Before? No data recorded Who Do You See at [facility]? No data recorded  Have You Recently Had Any Thoughts About Hurting Yourself? No data recorded Are You Planning to Commit Suicide/Harm Yourself At This time? No data recorded  Have you Recently Had Thoughts About Hurting Someone [name] [name]? No data recorded Explanation: No data recorded  Have You Used Any Alcohol or Drugs in the Past 24 Hours? No data recorded How Long Ago Did You Use Drugs or Alcohol? No data recorded What Did You Use and How Much? No data recorded  Do You Currently Have a Therapist/Psychiatrist? No data recorded Name of Therapist/Psychiatrist: No data recorded  Have You Been Recently Discharged From Any Office Practice or Programs? No data recorded Explanation of Discharge From Practice/Program: No data recorded    CCA Screening Triage Referral Assessment Type of Contact: No data recorded Is this Initial or Reassessment? No data recorded Date Telepsych consult ordered in [facility]:  No data recorded Time Telepsych consult ordered in [facility]:  No data recorded  Patient Reported Information  Reviewed? No data recorded Patient Left Without Being Seen? No data recorded Reason for Not Completing Assessment: No data recorded  Collateral Involvement: No data recorded  Does Patient Have a Court Appointed Legal  Guardian? No data recorded Name and Contact of Legal Guardian: No data recorded If Minor and Not Living with Parent(s), Who has Custody? No data recorded Is CPS involved or ever been involved? No data recorded Is APS involved or ever been involved? No data recorded  Patient Determined To Be At Risk for Harm To Self or Others Based on Review of Patient Reported Information or Presenting Complaint? No data recorded Method: No data recorded Availability of Means: No data recorded Intent: No data recorded Notification Required: No data recorded Additional Information for Danger to Others Potential: No data recorded Additional Comments for Danger to Others Potential: No data recorded Are There Guns or Other Weapons in Your Home? No data recorded Types of Guns/Weapons: No data recorded Are These Weapons Safely Secured?                            No data recorded Who Could Verify You Are Able To Have These Secured: No data recorded Do You Have any Outstanding Charges, Pending Court Dates, Parole/Probation? No data recorded Contacted To Inform of Risk of Harm To Self or Others: No data recorded  Location of Assessment: No data recorded  Does Patient Present under Involuntary Commitment? No data recorded IVC Papers Initial File Date: No data recorded  [place]idence[place] data recorded  Patient Currently Receiving the Following Services: No data recorded  Determination of Need: No data recorded  Options For Referral: No data recorded    CCA Biopsychosocial Intake/Chief Complaint:  Per EDP note: "I spoke with Ms. [name] [name] at the group home where patient resides. She provides the most history that is pertinent to this visit.     Unfortunately, patient lost her 54 year old son around 2518 December. Patient was notified of this by her father signs on 20 December. Patient has been grieving since then. Ms. [name] PoundDeborah notes that she would find the patient on the floor every morning. Patient has been  more removed. She has been crying a lot. More recently she has noted that patient has injured herself  /self harmed her in the legs with parts of furniture.  Patient has no complaints from her side. She does indicate that she is grieving. She does indicate that she has not been taking great care of herself. She does indicate that the swelling in her leg is worse. She denies any nausea, vomiting, fevers, chills. She has no suicidal ideation."  Current Symptoms/Problems: Grief.   Patient Reported Schizophrenia/Schizoaffective Diagnosis in Past: No   Strengths: Not assessed.  Preferences: Not assessed.  Abilities: Not assessed.   Type of Services Patient Feels are Needed: Not assessed.   Initial Clinical Notes/Concerns: No data recorded  Mental Health Symptoms Depression:  Sleep (too much or little)   Duration of Depressive symptoms: No data recorded  Mania:  None   Anxiety:   None   Psychosis:  None   Duration of Psychotic symptoms: No data recorded  Trauma:  None   Obsessions:  None   Compulsions:  None   Inattention:  None   Hyperactivity/Impulsivity:  N/A   Oppositional/Defiant Behaviors:  None   Emotional Irregularity:  None   Other Mood/Personality Symptoms:  No data recorded   Mental Status Exam Appearance and self-care  Stature:  Average   Weight:  No data recorded  Clothing:  No data recorded  Grooming:  No data recorded  Cosmetic use:  None   Posture/gait:  Normal   Motor activity:  Not Remarkable   Sensorium  Attention:  Normal   Concentration:  Normal   Orientation:  X5   Recall/memory:  Normal   Affect and Mood  Affect:  -- (Sad.)   Mood:  Other (Comment) (Sad.)   Relating  Eye contact:  Normal   Facial expression:  Responsive   Attitude toward examiner:  Cooperative   Thought and Language  Speech flow: Normal   Thought content:  Appropriate to Mood and Circumstances   Preoccupation:  None   Hallucinations:  None    Organization:  No data recorded  [employer] of Knowledge:  Fair   Intelligence:  Average   Abstraction:  -- Industrial/product designer)   Judgement:  Fair   Reality Testing:  -- (UTA)   Insight:  Poor   Decision Making:  -- (UTA)   Social Functioning  Social Maturity:  -- Industrial/product designer)   Social Judgement:  -- (UTA)   Stress  Stressors:  Grief/losses   Coping Ability:  Human resources officer Deficits:  Communication   Supports:  Friends/Service system     Religion: Religion/Spirituality Are You A Religious Person?: Yes What is Your Religious Affiliation?: Chiropodist: Leisure / Recreation Do You Have Hobbies?: No  Exercise/Diet: Exercise/Diet Do You Follow a Special Diet?: No Do You Have Any Trouble Sleeping?: Yes Explanation of Sleeping Difficulties: Pt reported, getting six hours of sleep.   CCA Employment/Education Employment/Work Situation: Employment / Work Situation Employment situation: On disability Why is patient on disability: Not assessed. How long has patient been on disability: Not assessed. What is the longest time patient has a held a job?: Not assessed. Where was the patient employed at that time?: Not assessed. Has patient ever been in the [employer]?: No  Education: Education Is Patient Currently Attending School?: No Last Grade Completed: 12 Did You Graduate From [employer]?: Yes Did You Attend College?: Yes What Type of College Degree Do you Have?: [employer].   CCA Family/Childhood History Family and Relationship History: Family history Marital status: Single What is your sexual orientation?: Not assessed. Has your sexual activity been affected by drugs, alcohol, medication, or emotional stress?: Not assessed. Does patient have children?: Yes How many children?: 3 How is patient's relationship with their children?: Pt reported, her son was killed on 02/02/2020.  Childhood History:  Childhood History By  whom was/is the patient raised?:  (Not assessed.) Additional childhood history information: Not assessed. Description of patient's relationship with caregiver when they were a child: Not assessed. Patient's description of current relationship with people who raised him/her: Not assessed. How were you disciplined when you got in trouble as a child/adolescent?: Not assessed. Does patient have siblings?: No Did patient suffer any verbal/emotional/physical/sexual abuse as a child?: No Did patient suffer from severe childhood neglect?: No Has patient ever been sexually abused/assaulted/raped as an adolescent or adult?: No Was the patient ever a victim of a crime or a disaster?: No Witnessed domestic violence?: No Has patient been affected by domestic violence as an adult?:  (NA)  Child/Adolescent Assessment:     CCA Substance Use Alcohol/Drug Use: Alcohol / Drug Use Pain Medications: See MAR Prescriptions: See MAR Over the Counter: See MAR History of alcohol / drug use?: No history of alcohol / drug  abuse    ASAM's:  Six Dimensions of Multidimensional Assessment  Dimension 1:  Acute Intoxication and/or Withdrawal Potential:      Dimension 2:  Biomedical Conditions and Complications:      Dimension 3:  Emotional, Behavioral, or Cognitive Conditions and Complications:     Dimension 4:  Readiness to Change:     Dimension 5:  Relapse, Continued use, or Continued Problem Potential:     Dimension 6:  Recovery/Living Environment:     ASAM Severity Score:    ASAM Recommended Level of Treatment:     Substance use Disorder (SUD)    Recommendations for Services/Supports/Treatments: Recommendations for Services/Supports/Treatments Recommendations For Services/Supports/Treatments: Other (Comment) (Pt to be observed pending collateral information from the group home owner.)  DSM5 Diagnoses: Patient Active Problem List   Diagnosis Date Noted  . Acute lower UTI 01/04/2020  . Pressure  injury of skin 01/03/2020  . Dysphagia 01/03/2020  . Hypocalcemia 01/03/2020  . Anemia in chronic kidney disease (CKD) 01/03/2020  . Sepsis (HCC) 01/02/2020  . Type 2 diabetes mellitus with hypoglycemia without coma (HCC) 01/02/2020  . AKI (acute kidney injury) (HCC) 01/01/2020  . Hyperlipidemia   . Essential hypertension   . Depression with anxiety   . Chronic obstructive pulmonary disease (HCC)   . Tobacco abuse 10/21/2019  . Sensory disturbance 10/21/2019  . TIA (transient ischemic attack) 10/20/2019  . Altered mental status   . Loss of consciousness (HCC) 09/08/2018  . Encephalopathy 09/08/2018  . 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 Anxiety 04/02/2017  . Urinary incontinence 01/25/2016  . Fecal incontinence 01/25/2016  . Dysthymia 01/25/2016     Referrals to Alternative Service(s): Referred to Alternative Service(s):   Place:   Date:   Time:    Referred to Alternative Service(s):   Place:   Date:   Time:    Referred to Alternative Service(s):   Place:   Date:   Time:    Referred to Alternative Service(s):   Place:   Date:   Time:     Redmond Pulling, [facility]  Comprehensive Clinical Assessment (CCA) Screening, Triage and Referral Note  02/19/2020 [name] [name] [id number]  Chief Complaint: No chief complaint on file.  Visit Diagnosis:   Patient Reported Information How did you hear about [place]? No data recorded  Referral name: No data recorded  Referral phone number: No data recorded Whom do you see for routine medical problems? No data recorded  Practice/Facility Name: No data recorded  Practice/Facility Phone Number: No data recorded  Name of Contact: No data recorded  Contact Number: No data recorded  Contact Fax Number: No data recorded  Prescriber Name: No data recorded  Prescriber Address (if known): No data recorded What Is the Reason for Your Visit/Call Today? No data recorded How Long Has This Been Causing You Problems?  No data recorded Have You Recently Been in Any Inpatient Treatment (Hospital/Detox/Crisis Center/28-Day Program)? No data recorded  Name/Location of Program/Hospital:No data recorded  How Long Were You There? No data recorded  When Were You Discharged? No data recorded Have You Ever Received Services From [facility] Before? No data recorded  Who Do You See at [facility]? No data recorded Have You Recently Had Any Thoughts About Hurting Yourself? No data recorded  Are You Planning to Commit Suicide/Harm Yourself At This time?  No data recorded Have you Recently Had Thoughts About Hurting Someone [name] [name]? No data recorded  Explanation: No data recorded Have  You Used Any Alcohol or Drugs in the Past 24 Hours? No data recorded  How Long Ago Did You Use Drugs or Alcohol?  No data recorded  What Did You Use and How Much? No data recorded What Do You Feel Would Help You the Most Today? No data recorded Do You Currently Have a Therapist/Psychiatrist? No data recorded  Name of Therapist/Psychiatrist: No data recorded  Have You Been Recently Discharged From Any Office Practice or Programs? No data recorded  Explanation of Discharge From Practice/Program:  No data recorded    CCA Screening Triage Referral Assessment Type of Contact: No data recorded  Is this Initial or Reassessment? No data recorded  Date Telepsych consult ordered in [facility]:  No data recorded  Time Telepsych consult ordered in [facility]L:  No data recorded Patient Reported Information Reviewed? No data recorded  Patient Left Without Being Seen? No data recorded  Reason for Not Completing Assessment: No data recorded Collateral Involvement: No data recorded Does Patient Have a Court Appointed Legal Guardian? No data recorded  Name and Contact of Legal Guardian:  No data recorded If Minor and Not Living with Parent(s), Who has Custody? No data recorded Is CPS involved or ever been involved? No data recorded Is APS involved or ever been  involved? No data recorded Patient Determined To Be At Risk for Harm To Self or Others Based on Review of Patient Reported Information or Presenting Complaint? No data recorded  Method: No data recorded  Availability of Means: No data recorded  Intent: No data recorded  Notification Required: No data recorded  Additional Information for Danger to Others Potential:  No data recorded  Additional Comments for Danger to Others Potential:  No data recorded  Are There Guns or Other Weapons in Your Home?  No data recorded   Types of Guns/Weapons: No data recorded   Are These Weapons Safely Secured?                              No data recorded   Who Could Verify You Are Able To Have These Secured:    No data recorded Do You Have any Outstanding Charges, Pending Court Dates, Parole/Probation? No data recorded Contacted To Inform of Risk of Harm To Self or Others: No data recorded Location of Assessment: No data recorded Does Patient Present under Involuntary Commitment? No data recorded  IVC Papers Initial File Date: No data recorded  [place] of Residence: No data recorded Patient Currently Receiving the Following Services: No data recorded  Determination of Need: No data recorded  Options For Referral: No data recorded  Redmond Pulling, [facility]     Redmond Pulling, MS, [facility], [facility] Triage Specialist [phone]

## 2020-02-19 NOTE — BH Assessment (Signed)
Clinician left a HIPPA compliant voice message for [name] [name], [phone]) owner of [facility] with call back information.    Redmond Pulling, MS, [facility] Triage Specialist [phone].

## 2020-09-10 ENCOUNTER — Emergency Department
Admission: EM | Admit: 2020-09-10 | Discharge: 2020-09-10 | Disposition: A | Discharge status: Home / Self Care | Payer: Medicaid Other | Attending: Student in an Organized Health Care Education/Training Program | Admitting: Student in an Organized Health Care Education/Training Program

## 2020-09-10 ENCOUNTER — Other Ambulatory Visit: Payer: Self-pay

## 2020-09-10 ENCOUNTER — Encounter: Payer: Self-pay | Admitting: Emergency Medicine

## 2020-09-10 DIAGNOSIS — I10 Essential (primary) hypertension: Secondary | ICD-10-CM | POA: Diagnosis not present

## 2020-09-10 DIAGNOSIS — Z7902 Long term (current) use of antithrombotics/antiplatelets: Secondary | ICD-10-CM | POA: Diagnosis not present

## 2020-09-10 DIAGNOSIS — F17210 Nicotine dependence, cigarettes, uncomplicated: Secondary | ICD-10-CM | POA: Insufficient documentation

## 2020-09-10 DIAGNOSIS — E119 Type 2 diabetes mellitus without complications: Secondary | ICD-10-CM | POA: Insufficient documentation

## 2020-09-10 DIAGNOSIS — Z96651 Presence of right artificial knee joint: Secondary | ICD-10-CM | POA: Insufficient documentation

## 2020-09-10 DIAGNOSIS — S8992XA Unspecified injury of left lower leg, initial encounter: Secondary | ICD-10-CM | POA: Diagnosis present

## 2020-09-10 DIAGNOSIS — X58XXXA Exposure to other specified factors, initial encounter: Secondary | ICD-10-CM | POA: Insufficient documentation

## 2020-09-10 DIAGNOSIS — Z79899 Other long term (current) drug therapy: Secondary | ICD-10-CM | POA: Diagnosis not present

## 2020-09-10 DIAGNOSIS — Z794 Long term (current) use of insulin: Secondary | ICD-10-CM | POA: Diagnosis not present

## 2020-09-10 DIAGNOSIS — Z91040 Latex allergy status: Secondary | ICD-10-CM | POA: Diagnosis not present

## 2020-09-10 DIAGNOSIS — S81802A Unspecified open wound, left lower leg, initial encounter: Secondary | ICD-10-CM | POA: Diagnosis not present

## 2020-09-10 DIAGNOSIS — Z7982 Long term (current) use of aspirin: Secondary | ICD-10-CM | POA: Diagnosis not present

## 2020-09-10 LAB — CBC WITH DIFFERENTIAL/PLATELET
Abs Immature Granulocytes: 0.01 10*3/uL (ref 0.00–0.07)
Basophils Absolute: 0 10*3/uL (ref 0.0–0.1)
Basophils Relative: 1 %
Eosinophils Absolute: 0.3 10*3/uL (ref 0.0–0.5)
Eosinophils Relative: 5 %
HCT: 26.9 % — ABNORMAL LOW (ref 36.0–46.0)
Hemoglobin: 8.7 g/dL — ABNORMAL LOW (ref 12.0–15.0)
Immature Granulocytes: 0 %
Lymphocytes Relative: 28 %
Lymphs Abs: 1.9 10*3/uL (ref 0.7–4.0)
MCH: 31.9 pg (ref 26.0–34.0)
MCHC: 32.3 g/dL (ref 30.0–36.0)
MCV: 98.5 fL (ref 80.0–100.0)
Monocytes Absolute: 0.6 10*3/uL (ref 0.1–1.0)
Monocytes Relative: 9 %
Neutro Abs: 3.8 10*3/uL (ref 1.7–7.7)
Neutrophils Relative %: 57 %
Platelets: 366 10*3/uL (ref 150–400)
RBC: 2.73 MIL/uL — ABNORMAL LOW (ref 3.87–5.11)
RDW: 15.9 % — ABNORMAL HIGH (ref 11.5–15.5)
WBC: 6.7 10*3/uL (ref 4.0–10.5)
nRBC: 0 % (ref 0.0–0.2)

## 2020-09-10 LAB — BASIC METABOLIC PANEL
Anion gap: 6 (ref 5–15)
BUN: 33 mg/dL — ABNORMAL HIGH (ref 6–20)
CO2: 24 mmol/L (ref 22–32)
Calcium: 8.5 mg/dL — ABNORMAL LOW (ref 8.9–10.3)
Chloride: 111 mmol/L (ref 98–111)
Creatinine, Ser: 1.54 mg/dL — ABNORMAL HIGH (ref 0.44–1.00)
GFR, Estimated: 40 mL/min — ABNORMAL LOW (ref >60)
Glucose, Bld: 120 mg/dL — ABNORMAL HIGH (ref 70–99)
Potassium: 4.7 mmol/L (ref 3.5–5.1)
Sodium: 141 mmol/L (ref 135–145)

## 2020-09-10 LAB — LACTIC ACID, PLASMA: Lactic Acid, Venous: 0.8 mmol/L (ref 0.5–1.9)

## 2020-09-10 MED ORDER — SULFAMETHOXAZOLE-TRIMETHOPRIM 800-160 MG PO TABS: 1.0000 | ORAL_TABLET | Freq: Two times a day (BID) | ORAL | 0 refills | Status: DC

## 2020-09-10 MED ORDER — SULFAMETHOXAZOLE-TRIMETHOPRIM 800-160 MG PO TABS
1.0000 | ORAL_TABLET | Freq: Once | ORAL | Status: AC
  Administered 2020-09-10: 1 via ORAL
  Filled 2020-09-10: qty 1

## 2020-09-10 NOTE — ED Notes (Signed)
Wet-to-dry dressing applied to pt's wound on left, lower leg using guaze and kerlex

## 2020-09-10 NOTE — Discharge Instructions (Addendum)
Keep the wound clean, dry, and covered.  Use a wet-to-dry dressing on the wound, with dressing changes every 1 to 2 days.  Give antibiotic as prescribed.  Follow-up with the [place] wound care center, for wound care check.  Return to the ED if needed.
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
## 2020-09-10 NOTE — ED Notes (Signed)
Called [facility] for transport to the [place] [facility] [place]  [address]

## 2020-09-10 NOTE — ED Triage Notes (Signed)
Pt brought in by [facility] with c/o left leg pain. She has had an infection in it, it is swollen and seeping.

## 2020-09-12 ENCOUNTER — Encounter: Payer: Self-pay | Admitting: *Deleted

## 2020-09-12 ENCOUNTER — Emergency Department: Payer: Medicaid Other

## 2020-09-12 ENCOUNTER — Other Ambulatory Visit: Payer: Self-pay

## 2020-09-12 ENCOUNTER — Emergency Department
Admission: EM | Admit: 2020-09-12 | Discharge: 2020-09-12 | Disposition: A | Discharge status: Home / Self Care | Payer: Medicaid Other | Attending: Emergency Medicine | Admitting: Emergency Medicine

## 2020-09-12 DIAGNOSIS — T148XXA Other injury of unspecified body region, initial encounter: Secondary | ICD-10-CM

## 2020-09-12 DIAGNOSIS — E1122 Type 2 diabetes mellitus with diabetic chronic kidney disease: Secondary | ICD-10-CM | POA: Diagnosis not present

## 2020-09-12 DIAGNOSIS — Z96651 Presence of right artificial knee joint: Secondary | ICD-10-CM | POA: Insufficient documentation

## 2020-09-12 DIAGNOSIS — N189 Chronic kidney disease, unspecified: Secondary | ICD-10-CM | POA: Diagnosis not present

## 2020-09-12 DIAGNOSIS — Z7982 Long term (current) use of aspirin: Secondary | ICD-10-CM | POA: Insufficient documentation

## 2020-09-12 DIAGNOSIS — Z91040 Latex allergy status: Secondary | ICD-10-CM | POA: Insufficient documentation

## 2020-09-12 DIAGNOSIS — R600 Localized edema: Secondary | ICD-10-CM | POA: Insufficient documentation

## 2020-09-12 DIAGNOSIS — M1990 Unspecified osteoarthritis, unspecified site: Secondary | ICD-10-CM

## 2020-09-12 DIAGNOSIS — I129 Hypertensive chronic kidney disease with stage 1 through stage 4 chronic kidney disease, or unspecified chronic kidney disease: Secondary | ICD-10-CM | POA: Diagnosis not present

## 2020-09-12 DIAGNOSIS — D631 Anemia in chronic kidney disease: Secondary | ICD-10-CM | POA: Diagnosis not present

## 2020-09-12 DIAGNOSIS — S81802A Unspecified open wound, left lower leg, initial encounter: Secondary | ICD-10-CM | POA: Insufficient documentation

## 2020-09-12 DIAGNOSIS — W010XXA Fall on same level from slipping, tripping and stumbling without subsequent striking against object, initial encounter: Secondary | ICD-10-CM | POA: Insufficient documentation

## 2020-09-12 DIAGNOSIS — S8992XA Unspecified injury of left lower leg, initial encounter: Secondary | ICD-10-CM | POA: Diagnosis present

## 2020-09-12 DIAGNOSIS — F17210 Nicotine dependence, cigarettes, uncomplicated: Secondary | ICD-10-CM | POA: Diagnosis not present

## 2020-09-12 DIAGNOSIS — S8002XA Contusion of left knee, initial encounter: Secondary | ICD-10-CM

## 2020-09-12 DIAGNOSIS — D649 Anemia, unspecified: Secondary | ICD-10-CM

## 2020-09-12 DIAGNOSIS — W19XXXA Unspecified fall, initial encounter: Secondary | ICD-10-CM

## 2020-09-12 DIAGNOSIS — Z79899 Other long term (current) drug therapy: Secondary | ICD-10-CM | POA: Insufficient documentation

## 2020-09-12 LAB — COMPREHENSIVE METABOLIC PANEL
ALT: 19 U/L (ref 0–44)
AST: 22 U/L (ref 15–41)
Albumin: 3.3 g/dL — ABNORMAL LOW (ref 3.5–5.0)
Alkaline Phosphatase: 50 U/L (ref 38–126)
Anion gap: 5 (ref 5–15)
BUN: 36 mg/dL — ABNORMAL HIGH (ref 6–20)
CO2: 23 mmol/L (ref 22–32)
Calcium: 8.8 mg/dL — ABNORMAL LOW (ref 8.9–10.3)
Chloride: 109 mmol/L (ref 98–111)
Creatinine, Ser: 1.91 mg/dL — ABNORMAL HIGH (ref 0.44–1.00)
GFR, Estimated: 31 mL/min — ABNORMAL LOW (ref >60)
Glucose, Bld: 92 mg/dL (ref 70–99)
Potassium: 4.9 mmol/L (ref 3.5–5.1)
Sodium: 137 mmol/L (ref 135–145)
Total Bilirubin: 0.7 mg/dL (ref 0.3–1.2)
Total Protein: 7.5 g/dL (ref 6.5–8.1)

## 2020-09-12 LAB — LACTIC ACID, PLASMA
Lactic Acid, Venous: 0.9 mmol/L (ref 0.5–1.9)
Lactic Acid, Venous: 1 mmol/L (ref 0.5–1.9)

## 2020-09-12 LAB — CBC WITH DIFFERENTIAL/PLATELET
Abs Immature Granulocytes: 0.01 10*3/uL (ref 0.00–0.07)
Basophils Absolute: 0 10*3/uL (ref 0.0–0.1)
Basophils Relative: 1 %
Eosinophils Absolute: 0.3 10*3/uL (ref 0.0–0.5)
Eosinophils Relative: 4 %
HCT: 26.7 % — ABNORMAL LOW (ref 36.0–46.0)
Hemoglobin: 8.7 g/dL — ABNORMAL LOW (ref 12.0–15.0)
Immature Granulocytes: 0 %
Lymphocytes Relative: 39 %
Lymphs Abs: 2.8 10*3/uL (ref 0.7–4.0)
MCH: 31.9 pg (ref 26.0–34.0)
MCHC: 32.6 g/dL (ref 30.0–36.0)
MCV: 97.8 fL (ref 80.0–100.0)
Monocytes Absolute: 0.7 10*3/uL (ref 0.1–1.0)
Monocytes Relative: 10 %
Neutro Abs: 3.4 10*3/uL (ref 1.7–7.7)
Neutrophils Relative %: 46 %
Platelets: 339 10*3/uL (ref 150–400)
RBC: 2.73 MIL/uL — ABNORMAL LOW (ref 3.87–5.11)
RDW: 15.8 % — ABNORMAL HIGH (ref 11.5–15.5)
WBC: 7.3 10*3/uL (ref 4.0–10.5)
nRBC: 0 % (ref 0.0–0.2)

## 2020-09-12 LAB — BRAIN NATRIURETIC PEPTIDE: B Natriuretic Peptide: 146.3 pg/mL — ABNORMAL HIGH (ref 0.0–100.0)

## 2020-09-12 LAB — TROPONIN I (HIGH SENSITIVITY)
Troponin I (High Sensitivity): <2 ng/L (ref <18)
Troponin I (High Sensitivity): <2 ng/L (ref <18)

## 2020-09-12 MED ORDER — ACETAMINOPHEN 500 MG PO TABS
1000.0000 mg | ORAL_TABLET | Freq: Once | ORAL | Status: AC
  Administered 2020-09-12: 1000 mg via ORAL
  Filled 2020-09-12: qty 2

## 2020-09-12 NOTE — ED Notes (Signed)
Pt given warm blanket.
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
## 2020-09-12 NOTE — ED Provider Notes (Signed)
Emergency Medicine Provider Triage Evaluation Note  [name] [name] , a 55 y.o. female  was evaluated in triage.  Pt complains of left leg pain.  Seen 2 days ago for left leg ulceration, placed on Bactrim.  Complains of left knee pain, redness and swelling..  Review of Systems  Positive: Left leg wound Negative: Fever, vomiting  Physical Exam  BP 118/73 (BP Location: Left Arm)   Pulse 82   Temp 99.5 F (37.5 C) (Oral)   Resp 18   SpO2 99%  Gen:   Awake, no distress   Resp:  Normal effort  MSK:   LLE with purulent wound, redness and swelling; right knee with ulceration Other:    Medical Decision Making  Medically screening exam initiated at 3:47 AM.  Appropriate orders placed.  Daphene Chisholm Sepulveda was informed that the remainder of the evaluation will be completed by another provider, this initial triage assessment does not replace that evaluation, and the importance of remaining in the ED until their evaluation is complete.  55 year old female returning for worsening left lower leg ulceration, now purulent.  Will obtain blood cultures, lab work, lactic acid.  Will obtain plain film x-rays of left knee as well as DVT ultrasound.  Patient awaiting treatment room.   Irean Hong, MD 09/12/20 [phone]
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
## 2020-09-12 NOTE — ED Notes (Addendum)
Pt and pt bed saturated in urine. Pt cleaned and changed, pt bed linen changed. Pts leg wrapped in gauze dressing.

## 2020-09-17 LAB — CULTURE, BLOOD (ROUTINE X 2)
Culture: NO GROWTH
Culture: NO GROWTH
Special Requests: ADEQUATE
Special Requests: ADEQUATE

## 2020-09-17 LAB — AEROBIC/ANAEROBIC CULTURE W GRAM STAIN (SURGICAL/DEEP WOUND): Special Requests: NORMAL

## 2020-09-20 ENCOUNTER — Other Ambulatory Visit: Payer: Self-pay

## 2020-09-20 ENCOUNTER — Encounter: Payer: Medicaid Other | Attending: Physician Assistant | Admitting: Physician Assistant

## 2020-09-20 DIAGNOSIS — S81812A Laceration without foreign body, left lower leg, initial encounter: Secondary | ICD-10-CM | POA: Insufficient documentation

## 2020-09-20 DIAGNOSIS — L97822 Non-pressure chronic ulcer of other part of left lower leg with fat layer exposed: Secondary | ICD-10-CM | POA: Diagnosis not present

## 2020-09-20 DIAGNOSIS — E11622 Type 2 diabetes mellitus with other skin ulcer: Secondary | ICD-10-CM | POA: Diagnosis not present

## 2020-09-20 DIAGNOSIS — X58XXXA Exposure to other specified factors, initial encounter: Secondary | ICD-10-CM | POA: Diagnosis not present

## 2020-09-20 DIAGNOSIS — F0150 Vascular dementia without behavioral disturbance: Secondary | ICD-10-CM | POA: Insufficient documentation

## 2020-09-20 DIAGNOSIS — L97829 Non-pressure chronic ulcer of other part of left lower leg with unspecified severity: Secondary | ICD-10-CM | POA: Diagnosis present

## 2020-09-22 ENCOUNTER — Other Ambulatory Visit: Payer: Self-pay

## 2020-09-22 ENCOUNTER — Emergency Department: Payer: Medicaid Other

## 2020-09-22 ENCOUNTER — Emergency Department
Admission: EM | Admit: 2020-09-22 | Discharge: 2020-09-22 | Disposition: A | Discharge status: Home / Self Care | Payer: Medicaid Other | Attending: Emergency Medicine | Admitting: Emergency Medicine

## 2020-09-22 DIAGNOSIS — J449 Chronic obstructive pulmonary disease, unspecified: Secondary | ICD-10-CM | POA: Insufficient documentation

## 2020-09-22 DIAGNOSIS — E119 Type 2 diabetes mellitus without complications: Secondary | ICD-10-CM | POA: Insufficient documentation

## 2020-09-22 DIAGNOSIS — S81802A Unspecified open wound, left lower leg, initial encounter: Secondary | ICD-10-CM | POA: Diagnosis not present

## 2020-09-22 DIAGNOSIS — X58XXXA Exposure to other specified factors, initial encounter: Secondary | ICD-10-CM | POA: Diagnosis not present

## 2020-09-22 DIAGNOSIS — N189 Chronic kidney disease, unspecified: Secondary | ICD-10-CM | POA: Diagnosis not present

## 2020-09-22 DIAGNOSIS — Z91040 Latex allergy status: Secondary | ICD-10-CM | POA: Insufficient documentation

## 2020-09-22 DIAGNOSIS — Z794 Long term (current) use of insulin: Secondary | ICD-10-CM | POA: Insufficient documentation

## 2020-09-22 DIAGNOSIS — Z7982 Long term (current) use of aspirin: Secondary | ICD-10-CM | POA: Insufficient documentation

## 2020-09-22 DIAGNOSIS — F17210 Nicotine dependence, cigarettes, uncomplicated: Secondary | ICD-10-CM | POA: Insufficient documentation

## 2020-09-22 DIAGNOSIS — S8992XA Unspecified injury of left lower leg, initial encounter: Secondary | ICD-10-CM | POA: Diagnosis present

## 2020-09-22 DIAGNOSIS — Z96651 Presence of right artificial knee joint: Secondary | ICD-10-CM | POA: Diagnosis not present

## 2020-09-22 DIAGNOSIS — I129 Hypertensive chronic kidney disease with stage 1 through stage 4 chronic kidney disease, or unspecified chronic kidney disease: Secondary | ICD-10-CM | POA: Diagnosis not present

## 2020-09-22 DIAGNOSIS — Z79899 Other long term (current) drug therapy: Secondary | ICD-10-CM | POA: Diagnosis not present

## 2020-09-22 DIAGNOSIS — D631 Anemia in chronic kidney disease: Secondary | ICD-10-CM | POA: Insufficient documentation

## 2020-09-22 DIAGNOSIS — Z7951 Long term (current) use of inhaled steroids: Secondary | ICD-10-CM | POA: Insufficient documentation

## 2020-09-22 LAB — BASIC METABOLIC PANEL
Anion gap: 8 (ref 5–15)
BUN: 38 mg/dL — ABNORMAL HIGH (ref 6–20)
CO2: 18 mmol/L — ABNORMAL LOW (ref 22–32)
Calcium: 9 mg/dL (ref 8.9–10.3)
Chloride: 109 mmol/L (ref 98–111)
Creatinine, Ser: 1.84 mg/dL — ABNORMAL HIGH (ref 0.44–1.00)
GFR, Estimated: 32 mL/min — ABNORMAL LOW (ref >60)
Glucose, Bld: 93 mg/dL (ref 70–99)
Potassium: 5.5 mmol/L — ABNORMAL HIGH (ref 3.5–5.1)
Sodium: 135 mmol/L (ref 135–145)

## 2020-09-22 LAB — CBC
HCT: 29.8 % — ABNORMAL LOW (ref 36.0–46.0)
Hemoglobin: 9.4 g/dL — ABNORMAL LOW (ref 12.0–15.0)
MCH: 31.3 pg (ref 26.0–34.0)
MCHC: 31.5 g/dL (ref 30.0–36.0)
MCV: 99.3 fL (ref 80.0–100.0)
Platelets: 308 10*3/uL (ref 150–400)
RBC: 3 MIL/uL — ABNORMAL LOW (ref 3.87–5.11)
RDW: 15.9 % — ABNORMAL HIGH (ref 11.5–15.5)
WBC: 6.8 10*3/uL (ref 4.0–10.5)
nRBC: 0 % (ref 0.0–0.2)

## 2020-09-22 MED ORDER — ONDANSETRON HCL 4 MG/2ML IJ SOLN
4.0000 mg | Freq: Once | INTRAMUSCULAR | Status: AC
  Administered 2020-09-22: 4 mg via INTRAVENOUS
  Filled 2020-09-22: qty 2

## 2020-09-22 MED ORDER — SODIUM CHLORIDE 0.9 % IV BOLUS
1000.0000 mL | Freq: Once | INTRAVENOUS | Status: AC
  Administered 2020-09-22: 1000 mL via INTRAVENOUS

## 2020-09-22 MED ORDER — PATIROMER SORBITEX CALCIUM 8.4 G PO PACK
8.4000 g | PACK | Freq: Every day | ORAL | Status: DC
  Administered 2020-09-22: 8.4 g via ORAL
  Filled 2020-09-22: qty 1

## 2020-09-22 MED ORDER — DOXYCYCLINE HYCLATE 100 MG PO CAPS: 100.0000 mg | ORAL_CAPSULE | Freq: Two times a day (BID) | ORAL | 0 refills | Status: AC

## 2020-09-22 MED ORDER — MORPHINE SULFATE (PF) 4 MG/ML IV SOLN
6.0000 mg | Freq: Once | INTRAVENOUS | Status: AC
  Administered 2020-09-22: 6 mg via INTRAVENOUS
  Filled 2020-09-22: qty 2

## 2020-09-22 MED ORDER — HYDROCODONE-ACETAMINOPHEN 5-325 MG PO TABS: 1.0000 | ORAL_TABLET | ORAL | 0 refills | Status: DC | PRN

## 2020-09-22 NOTE — ED Notes (Signed)
Patient sleeping, awaiting ambulance transport

## 2020-09-22 NOTE — ED Triage Notes (Signed)
Pt here with left leg pain that started yesterday. Pt denies injury. Pt has a wound on her left leg and she wrapped it due to it draining.

## 2020-09-22 NOTE — Discharge Instructions (Addendum)
You can take the hydrocodone as needed for severe pain.  Continue local wound care with wound dressing changes every day.  I recommend a nonstick dressing and Kerlix.  Continue routine care at the wound clinic, would recommend being seen ideally at least twice a week for the next several weeks until healed.  Because of your ongoing pain, we are going to continue antibiotics to prevent superinfection.  I prescribed doxycycline for the next week.

## 2020-09-22 NOTE — ED Notes (Signed)
First Nurse Note: Pt to ED via [facility] from [facility] for leg pain. Pt is in NAD. VSS per ems.

## 2020-09-22 NOTE — ED Notes (Signed)
Pt under guardianship of [facility]. Attempted to call legal guardian listed with no answer. Supervisor Lafayette Dragon contacted but also did not answer. Attempted to leave voicemail but mailbox Is full at this time.

## 2020-09-22 NOTE — ED Provider Notes (Signed)
[facility] Emergency Department Provider Note  ____________________________________________   Event Date/Time   First MD Initiated Contact with Patient 09/22/20 1056     (approximate)  I have reviewed the triage vital signs and the nursing notes.   HISTORY  Chief Complaint Leg Pain    HPI [name] [name] is a 55 y.o. female with past medical history of stroke, diabetes, hypertension, here with left leg pain.  The patient has been seen twice in the last several weeks for wound to the left leg.  She was just seen in the wound clinic yesterday and had this dressed.  She states she is here today because her wound has been increasingly painful over the last several days.  She denies any increased drainage or swelling.  Denies any increased redness.  No warmth.  No fevers.  She was hoping the wound clinic would give her something for pain yesterday but they did not.  They dressed the wound and she has been receiving wound care at her facility.  Denies any other acute complaints.  No new injuries.  Pain is 8 out of 10 at rest, worse with any kind of movement or palpation.  No specific leaving factors.  She has been taking Tylenol as needed.    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   Acute lower UTI 01/04/2020   Pressure injury of skin 01/03/2020   Dysphagia 01/03/2020   Hypocalcemia 01/03/2020   Anemia in chronic kidney disease (CKD) 01/03/2020   Sepsis (HCC) 01/02/2020   Type 2 diabetes mellitus with hypoglycemia without coma (HCC) 01/02/2020   AKI (acute kidney injury) (HCC)  01/01/2020   Hyperlipidemia    Essential hypertension    Depression with anxiety    Chronic obstructive pulmonary disease (HCC)    Tobacco abuse 10/21/2019   Sensory disturbance 10/21/2019   TIA (transient ischemic attack) 10/20/2019   Altered mental status    Loss of consciousness (HCC) 09/08/2018   Encephalopathy 09/08/2018   S/P TKR (total knee replacement) using cement, 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   Anxiety 04/02/2017   Urinary incontinence 01/25/2016   Fecal incontinence 01/25/2016   Dysthymia 01/25/2016    Past Surgical History:  Procedure Laterality Date   COLONOSCOPY WITH PROPOFOL N/A 11/27/2018   Procedure: COLONOSCOPY WITH PROPOFOL;  Surgeon: Toledo, Boykin Nearing, MD;  Location: [facility] ENDOSCOPY;  Service: Gastroenterology;  Laterality: N/A;   ESOPHAGOGASTRODUODENOSCOPY (EGD) WITH PROPOFOL N/A 11/27/2018   Procedure: ESOPHAGOGASTRODUODENOSCOPY (EGD) WITH PROPOFOL;  Surgeon: Toledo, Boykin Nearing, MD;  Location: [facility] ENDOSCOPY;  Service: Gastroenterology;  Laterality: N/A;   NO PAST SURGERIES     TOTAL KNEE ARTHROPLASTY Right 10/03/2017   Procedure: TOTAL KNEE ARTHROPLASTY;  Surgeon: Lyndle Herrlich, MD;  Location: [facility] ORS;  Service: Orthopedics;  Laterality: Right;    Prior to Admission medications   Medication Sig Start Date End Date Taking? Authorizing Provider  doxycycline (VIBRAMYCIN) 100 MG capsule Take 1 capsule (100 mg total) by mouth 2 (two) times daily for 7  days. 09/22/20 09/29/20 Yes Shaune Pollack, MD  HYDROcodone-acetaminophen (NORCO/VICODIN) 5-325 MG tablet Take 1 tablet by mouth every 4 (four) hours as needed for severe pain. 09/22/20 09/22/21 Yes Shaune Pollack, MD  acetaminophen (TYLENOL) 650 MG CR tablet Take 650 mg by mouth every 8 (eight) hours as needed for pain.    [provider]  albuterol (PROVENTIL HFA;VENTOLIN HFA) 108 (90 Base) MCG/ACT inhaler Inhale 2 puffs into the lungs every 4 (four) hours as needed for wheezing or shortness of breath.    [provider]  amLODipine (NORVASC) 5 MG tablet Take 1 tablet (5 mg total) by mouth every morning. 01/08/20 02/07/20  Sherryll Burger, Pratik D, DO  aspirin EC 81 MG tablet Take 1 tablet (81 mg total) by mouth daily. Swallow whole. 10/22/19 10/21/20  Vassie Loll, MD  atorvastatin (LIPITOR) 40 MG tablet Take 40 mg by mouth at bedtime. (2000)    [provider]  busPIRone (BUSPAR) 15 MG tablet Take 1 tablet (15 mg total) by mouth 3 (three) times daily. 02/19/20   Aldean Baker, NP  cholecalciferol (VITAMIN D) 25 MCG (1000 UNIT) tablet Take 1 tablet (1,000 Units total) by mouth daily. (0800) Patient not taking: Reported on 01/01/2020 10/22/19   Vassie Loll, MD  cholecalciferol (VITAMIN D3) 25 MCG (1000 UT) tablet Take 1,000 Units by mouth every morning.    [provider]  clopidogrel (PLAVIX) 75 MG tablet Take 1 tablet (75 mg total) by mouth daily with breakfast. 10/23/19   Vassie Loll, MD  Dexlansoprazole 30 MG capsule Take 30 mg by mouth daily.    [provider]  diclofenac sodium (VOLTAREN) 1 % GEL Apply 2 g topically 4 (four) times daily as needed. Patient taking differently: Apply 2 g topically in the morning and at bedtime. 09/13/18   Briant Cedar, MD  docusate sodium (COLACE) 100 MG capsule Take 1 capsule (100 mg total) by mouth 2 (two) times daily. Patient not taking: Reported on 01/01/2020 10/22/19   Vassie Loll, MD  ferrous sulfate 325 (65 FE) MG tablet Take 1 tablet (325 mg total) by mouth 2 (two) times daily with a meal. Patient taking differently: Take 325 mg by mouth daily with breakfast. 09/13/18 01/01/20  Briant Cedar, MD  Fluticasone-Salmeterol (ADVAIR) 100-50 MCG/DOSE AEPB Inhale 1 puff into the lungs 2 (two) times daily.    [provider]  folic acid (FOLVITE) 1 MG tablet Take 1 mg by mouth daily.    [provider]  furosemide (LASIX) 20 MG tablet Take 1 tablet (20 mg total) by mouth 2 (two) times daily. (0800) Patient taking  differently: Take 30 mg by mouth 2 (two) times daily. (0800) 10/22/19   Vassie Loll, MD  guaiFENesin (ROBITUSSIN) 100 MG/5ML SOLN Take 5 mLs by mouth 3 (three) times daily.    [provider]  LANTUS SOLOSTAR 100 UNIT/ML Solostar Pen Inject 12 Units into the skin at bedtime. 01/13/20   [provider]  LORazepam (ATIVAN) 1 MG tablet Take 1 mg by mouth 2 (two) times daily as needed for anxiety. 02/09/20   [provider]  megestrol (MEGACE) 40 MG tablet Take 40 mg by mouth daily.    [provider]  nicotine (NICODERM CQ - DOSED IN MG/24 HOURS) 21 mg/24hr patch Place 1 patch (21 mg total) onto the skin daily. Patient not taking: No sig reported 10/23/19   Vassie Loll, MD  pantoprazole (PROTONIX) 40 MG tablet Take 1 tablet (40 mg total) by mouth  daily. 09/14/18 10/20/19  Briant Cedar, MD  sodium bicarbonate 650 MG tablet Take 650 mg by mouth daily. 02/17/20   [provider]  traMADol (ULTRAM) 50 MG tablet Take 50 mg by mouth 4 (four) times daily.  09/30/19   [provider]  traZODone (DESYREL) 100 MG tablet Take 100 mg by mouth at bedtime.    [provider]  vitamin B-12 (CYANOCOBALAMIN) 1000 MCG tablet Take 1,000 mcg by mouth daily.    [provider]  zolpidem (AMBIEN) 10 MG tablet Take 10 mg by mouth at bedtime as needed. 02/09/20   [provider]    Allergies Latex  Family History  Problem Relation Age of Onset   Heart disease Mother    Heart disease Father     Social History Social History   Tobacco Use   Smoking status: Every Day    Types: Cigarettes   Smokeless tobacco: Never  Vaping Use   Vaping Use: Never used  Substance Use Topics   Alcohol use: No   Drug use: No    Review of Systems  Review of Systems  Constitutional:  Negative for fatigue and fever.  HENT:  Negative for congestion and sore throat.   Eyes:  Negative for visual disturbance.  Respiratory:  Negative for cough  and shortness of breath.   Cardiovascular:  Negative for chest pain.  Gastrointestinal:  Negative for abdominal pain, diarrhea, nausea and vomiting.  Genitourinary:  Negative for flank pain.  Musculoskeletal:  Positive for arthralgias and gait problem. Negative for back pain and neck pain.  Skin:  Positive for wound. Negative for rash.  Neurological:  Negative for weakness.  All other systems reviewed and are negative.   ____________________________________________  PHYSICAL EXAM:      VITAL SIGNS: ED Triage Vitals [09/22/20 0953]  Enc Vitals Group     BP 130/74     Pulse Rate 70     Resp 18     Temp 98 F (36.7 C)     Temp Source Oral     SpO2 100 %     Weight 216 lb (98 kg)     Height 5\' 8"  (1.727 m)     Head Circumference      Peak Flow      Pain Score 10     Pain Loc      Pain Edu?      Excl. in GC?      Physical Exam Vitals and nursing note reviewed.  Constitutional:      General: She is not in acute distress.    Appearance: She is well-developed.  HENT:     Head: Normocephalic and atraumatic.  Eyes:     Conjunctiva/sclera: Conjunctivae normal.  Cardiovascular:     Rate and Rhythm: Normal rate and regular rhythm.     Heart sounds: Normal heart sounds. No murmur heard.   No friction rub.  Pulmonary:     Effort: Pulmonary effort is normal. No respiratory distress.     Breath sounds: Normal breath sounds. No wheezing or rales.  Abdominal:     General: There is no distension.     Palpations: Abdomen is soft.     Tenderness: There is no abdominal tenderness.  Musculoskeletal:     Cervical back: Neck supple.  Skin:    General: Skin is warm.     Capillary Refill: Capillary refill takes less than 2 seconds.  Neurological:     Mental Status: She is alert  and oriented to person, place, and time.     Motor: No abnormal muscle tone.     LOWER EXTREMITY EXAM: LEFT  INSPECTION & PALPATION: Superficial ulceration to left anterior shin, with exposed granulation  tissue but no erythema, fluctuance, drainage, or foul odor. No bony TTP or swelling.  SENSORY: sensation is intact to light touch in:  Superficial peroneal nerve distribution (over dorsum of foot) Deep peroneal nerve distribution (over first dorsal web space) Sural nerve distribution (over lateral aspect 5th metatarsal) Saphenous nerve distribution (over medial instep)  MOTOR:  + Motor EHL (great toe dorsiflexion) + FHL (great toe plantar flexion)  + TA (ankle dorsiflexion)  + GSC (ankle plantar flexion)  VASCULAR: 2+ dorsalis pedis and posterior tibialis pulses Capillary refill < 2 sec, toes warm and well-perfused  COMPARTMENTS: Soft, warm, well-perfused No pain with passive extension No parethesias  ____________________________________________   LABS (all labs ordered are listed, but only abnormal results are displayed)  Labs Reviewed  CBC - Abnormal; Notable for the following components:      Result Value   RBC 3.00 (*)    Hemoglobin 9.4 (*)    HCT 29.8 (*)    RDW 15.9 (*)    All other components within normal limits  BASIC METABOLIC PANEL - Abnormal; Notable for the following components:   Potassium 5.5 (*)    CO2 18 (*)    BUN 38 (*)    Creatinine, Ser 1.84 (*)    GFR, Estimated 32 (*)    All other components within normal limits    ____________________________________________  EKG:  ________________________________________  RADIOLOGY All imaging, including plain films, CT scans, and ultrasounds, independently reviewed by me, and interpretations confirmed via formal radiology reads.  ED MD interpretation:   XR Leg: Soft tissue swelling otherwise negative  Official radiology report(s): DG Tibia/Fibula Left  Result Date: 09/22/2020 CLINICAL DATA:  Left leg pain.  Wounds. EXAM: LEFT TIBIA AND FIBULA - 2 VIEW COMPARISON:  None FINDINGS: Soft tissue swelling is present over the lateral malleolus. Soft tissue swelling is also present in the skin anteriorly. No  underlying osseous abnormality is present. Atherosclerotic changes are present in arteries. IMPRESSION: 1. Soft tissue swelling over the lateral malleolus and skin anteriorly without underlying osseous abnormality. 2. Atherosclerosis. Electronically Signed   By: Marin Robertshristopher  Mattern M.D.   On: 09/22/2020 13:00    ____________________________________________  PROCEDURES   Procedure(s) performed (including Critical Care):  Procedures  ____________________________________________  INITIAL IMPRESSION / MDM / ASSESSMENT AND PLAN / ED COURSE  As part of my medical decision making, I reviewed the following data within the electronic MEDICAL RECORD NUMBER Nursing notes reviewed and incorporated, Old chart reviewed, Notes from prior ED visits, and [place] Controlled Substance Database       *Jerilee HohJennifer R Selman was evaluated in Emergency Department on 09/22/2020 for the symptoms described in the history of present illness. She was evaluated in the context of the global COVID-19 pandemic, which necessitated consideration that the patient might be at risk for infection with the SARS-CoV-2 virus that causes COVID-19. Institutional protocols and algorithms that pertain to the evaluation of patients at risk for COVID-19 are in a state of rapid change based on information released by regulatory bodies including the CDC and federal and state organizations. These policies and algorithms were followed during the patient's care in the ED.  Some ED evaluations and interventions may be delayed as a result of limited staffing during the pandemic.*     Medical  Decision Making: 55 year old female here with wound to the left anterior leg and associated pain.  Patient does have a large open wound but it actually appears significantly improved compared to images/photographs from prior visit 2 weeks ago.  She was just seen at wound care clinic yesterday and had this appropriately dressed.  There is no surrounding erythema, redness,  drainage, or foul odor to suggest superimposed infection or deeper infection.  Plain film showed no bony abnormality or deep tissue involvement.  Patient has no leukocytosis.  No evidence to suggest ongoing or worsening infection.  She does appear mildly dehydrated with slight elevation in her potassium.  Creatinine remains elevated compared to baseline, and bicarb 18.  She has had decreased p.o. intake.  She was given IV fluids.  Abdomen is soft and nontender with no apparent emergent pathology I suspect this is due to decreased p.o. intake from her pain. Will start on fluids, follow-up x-ray and reassess  X-ray negative.  Will place on doxycycline given open wound with high risk for superinfection, advised continued wound care, and discharge with encouraged hydration and outpatient follow-up.  Brief course of analgesia given.  ____________________________________________  FINAL CLINICAL IMPRESSION(S) / ED DIAGNOSES  Final diagnoses:  Wound of left lower extremity, initial encounter     MEDICATIONS GIVEN DURING THIS VISIT:  Medications  patiromer Lelon Perla) packet 8.4 g (8.4 g Oral Given 09/22/20 1126)  sodium chloride 0.9 % bolus 1,000 mL (1,000 mLs Intravenous New Bag/Given 09/22/20 1123)  morphine 4 MG/ML injection 6 mg (6 mg Intravenous Given 09/22/20 1124)  ondansetron (ZOFRAN) injection 4 mg (4 mg Intravenous Given 09/22/20 1124)     ED Discharge Orders          Ordered    HYDROcodone-acetaminophen (NORCO/VICODIN) 5-325 MG tablet  Every 4 hours PRN        09/22/20 1325    doxycycline (VIBRAMYCIN) 100 MG capsule  2 times daily        09/22/20 1326             Note:  This document was prepared using Dragon voice recognition software and may include unintentional dictation errors.   Shaune Pollack, MD 09/22/20 1346

## 2020-09-22 NOTE — ED Notes (Signed)
Pt given crackers and water. Pt changed and given new brief. Leg wound rewrapped with clean gauze and telfa.

## 2020-09-23 NOTE — Progress Notes (Signed)
TEVIS, CONGER ([id number]) Visit Report for 09/20/2020 Chief Complaint Document Details Patient Name: [name] [name], [name] [name]. Date of Service: 09/20/2020 10:00 AM Medical Record Number: [id number] Patient Account Number: [id number] Date of Birth/Sex: December 23, 1965 (55 y.o. Female) Treating RN: [name] [name] Primary Care Provider: SYSTEM, PCP Other Clinician: Referring Provider: Willy [name] Treating Provider/Extender: [name] [name] in Treatment: 0 Information Obtained from: Patient Chief Complaint Left leg traumatic ulcer Electronic Signature(s) Signed: 09/20/2020 10:48:24 AM By: [name] Kelp PA-C Entered By: [name] [name] on 09/20/2020 10:48:24 [name] [name], [name] [name] ([id number]) -------------------------------------------------------------------------------- Debridement Details Patient Name: [name] [name], [name] Date of Service: 09/20/2020 10:00 AM Medical Record Number: [id number] Patient Account Number: [id number] Date of Birth/Sex: 1965-07-21 (55 y.o. Female) Treating RN: [name] [name] Primary Care Provider: SYSTEM, PCP Other Clinician: Referring Provider: Willy [name] Treating Provider/Extender: [name] [name] in Treatment: 0 Debridement Performed for Wound #1 Left,Midline,Anterior Lower Leg Assessment: Performed By: Physician [name] Meuse., PA-C Debridement Type: Chemical/Enzymatic/Mechanical Agent Used: saline and gauze Level of Consciousness (Pre- Awake and Alert procedure): Pre-procedure Verification/Time Out Yes - 10:45 Taken: Pain Control: Lidocaine Instrument: Other : saline and gauze Bleeding: Minimum Hemostasis Achieved: Pressure Response to Treatment: Procedure was tolerated well Level of Consciousness (Post- Awake and Alert procedure): Post Debridement Measurements of Total Wound Length: (cm) 5.5 Width: (cm) 3.8 Depth: (cm) 0.3 Volume: (cm) 4.924 Character of Wound/Ulcer Post Debridement: Stable Post Procedure Diagnosis Same as  Pre-procedure Electronic Signature(s) Signed: 09/20/2020 11:53:54 AM By: [name] [name], BSN, RN, CWS, Kim RN, BSN Signed: 09/20/2020 4:30:46 PM By: [name] Kelp PA-C Entered By: [name] [name], BSN, RN, CWS, [name] [name] on 09/20/2020 11:53:54 [name] [name], [name] [name] ([id number]) -------------------------------------------------------------------------------- HPI Details Patient Name: [name], [name] Date of Service: 09/20/2020 10:00 AM Medical Record Number: [id number] Patient Account Number: [id number] Date of Birth/Sex: 1965-04-18 (55 y.o. Female) Treating RN: [name] [name] Primary Care Provider: SYSTEM, PCP Other Clinician: Referring Provider: Willy [name] Treating Provider/Extender: [name] [name] in Treatment: 0 History of Present Illness HPI Description: 09/20/2020 upon evaluation today patient presents for initial inspection here in our clinic concerning issues that she has been having with a trauma wound with a skin tear on the anterior portion of her left leg after getting in a encounter with another resident at the facility at the [facility]. Subsequently she is having quite a bit of pain according to what she is telling me today. There does not appear to be any signs of active infection which is great news. With that being said I do see some signs of hypergranulation so I believe there is a little bit too much moisture going on I am not sure what dressing wound they are using as all she had on was basically just a protective dressing today nothing really more. Either way I think that she would actually do well with [facility] which is probably the best option currently based on what I am seeing. The surface otherwise looks good just a little bit hypergranular. She does have a history of seemingly dementia along with some other psychiatric conditions in general. She does have diabetes mellitus type 2 as well. Electronic Signature(s) Signed: 09/20/2020 11:59:53 AM By: [name] Kelp  PA-C Entered By: [name] [name] on 09/20/2020 11:59:53 [name] [name], [name] [name] ([id number]) -------------------------------------------------------------------------------- Physical Exam Details Patient Name: [name] [name], [name] Date of Service: 09/20/2020 10:00 AM Medical Record Number: [id number] Patient Account Number: [id number] Date of Birth/Sex: [birth year] (55 y.o. Female) Treating RN: [name] [name] Primary Care Provider: SYSTEM, PCP Other Clinician: Referring  Provider: Willy EddyOBINSON, [name] Treating Provider/Extender: [name] BlaseStone, [name] Higinbotham Weeks in Treatment: 0 Constitutional sitting or standing blood pressure is within target range for patient.. [name] regular and within target range for patient.Marland Kitchen. respirations regular, non- labored and within target range for patient.Marland Kitchen. temperature within target range for patient.. Well-nourished and well-hydrated in no acute distress. Eyes conjunctiva clear no eyelid edema noted. pupils equal round and reactive to light and accommodation. Ears, Nose, Mouth, and Throat no gross abnormality of ear auricles or external auditory canals. normal hearing noted during conversation. mucus membranes moist. Respiratory normal breathing without difficulty. Cardiovascular 2+ dorsalis pedis/posterior tibialis pulses. 1+ pitting edema of the bilateral lower extremities. Musculoskeletal normal gait and posture. no significant deformity or arthritic changes, no loss or range of motion, no clubbing. Psychiatric this patient is able to make decisions and demonstrates good insight into disease process. Alert and Oriented x 3. pleasant and cooperative. Notes Upon inspection patient's wound bed actually showed signs of good granulation epithelization at this point in some areas although there is a lot of hypergranular tissue I think this is good to be the main issue is managing that in order to get new tissue growth occurring here. With that being said I do think that we should go  ahead and initiate treatment with a Hydrofera Blue dressing which is probably can to be the best way to go in general I am glad that she has good blood flow again I see no evidence of vascular compromise here which is also great news. Electronic Signature(s) Signed: 09/20/2020 12:00:24 PM By: [name] KelpStone III, Aashir Umholtz PA-C Entered By: [name] KelpStone III, Gerold Sar on 09/20/2020 12:00:24 Koerner, [name] BeckersJENNIFER R. ([id number]) -------------------------------------------------------------------------------- Physician Orders Details Patient Name: [name] [name], [name] Date of Service: 09/20/2020 10:00 AM Medical Record Number: [id number] Patient Account Number: [id number] Date of Birth/Sex: 08-Nov-1965 (55 y.o. Female) Treating RN: [name] CoventryWoody, [name] [name] Primary Care Provider: SYSTEM, PCP Other Clinician: Referring Provider: Willy EddyOBINSON, [name] Treating Provider/Extender: [name] BlaseStone, Kalianna Verbeke Weeks in Treatment: 0 Verbal / Phone Orders: No Diagnosis Coding ICD-10 Coding Code Description [phone] Laceration without foreign body, left lower leg, initial encounter L97.822 Non-pressure chronic ulcer of other part of left lower leg with fat layer exposed E11.622 Type 2 diabetes mellitus with other skin ulcer F01.50 Vascular dementia without behavioral disturbance Follow-up Appointments Wound #1 Left,Midline,Anterior Lower Leg o Return Appointment in 2 weeks. Bathing/ Shower/ Hygiene o May shower; gently cleanse wound with antibacterial soap, rinse and pat dry prior to dressing wounds - Do not leave dressing once wet. Anesthetic (Use 'Patient Medications' Section for Anesthetic Order Entry) o Lidocaine applied to wound bed Edema Control - Lymphedema / Segmental Compressive Device / Other o Tubigrip single layer applied. - F Wound Treatment Wound #1 - Lower Leg Wound Laterality: Left, Midline, Anterior Primary Dressing: Hydrofera Blue Ready Transfer Foam, 2.5x2.5 (in/in) Discharge Instructions: Apply Hydrofera Blue Ready to wound bed as  directed Secondary Dressing: ABD Pad 5x9 (in/in) Discharge Instructions: Cover with ABD pad Secured With: Tubigrip Size F, 4x10 (in/yd) Discharge Instructions: Apply Tubigrip F 3 finger-widths below knee to base of toes to secure dressing and/or for swelling. Electronic Signature(s) Signed: 09/20/2020 4:30:46 PM By: [name] KelpStone III, Lorrin Nawrot PA-C Signed: 09/22/2020 5:43:31 PM By: [name] GurneyWoody, BSN, RN, CWS, Kim RN, BSN Entered By: [name] GurneyWoody, BSN, RN, CWS, [name] [name] on 09/20/2020 10:54:45 Kunsman, [name] BeckersJENNIFER R. ([id number]) -------------------------------------------------------------------------------- Problem List Details Patient Name: [name] [name], [name] Date of Service: 09/20/2020 10:00 AM Medical Record Number: [id number] Patient Account Number: [id number] Date of Birth/Sex: 08-Nov-1965 (55 y.o. Female) Treating RN: Mordecai MaesSanchez,  Dondra Prader Primary Care Provider: SYSTEM, PCP Other Clinician: Referring Provider: Willy [name] Treating Provider/Extender: [name] [name] in Treatment: 0 Active Problems ICD-10 Encounter Code Description Active Date MDM Diagnosis S81.812A Laceration without foreign body, left lower leg, initial encounter 09/20/2020 No Yes L97.822 Non-pressure chronic ulcer of other part of left lower leg with fat layer 09/20/2020 No Yes exposed E11.622 Type 2 diabetes mellitus with other skin ulcer 09/20/2020 No Yes F01.50 Vascular dementia without behavioral disturbance 09/20/2020 No Yes Inactive Problems Resolved Problems Electronic Signature(s) Signed: 09/20/2020 10:48:14 AM By: [name] Kelp PA-C Entered By: [name] [name] on 09/20/2020 10:48:13 Centner, [name] [name] ([id number]) -------------------------------------------------------------------------------- Progress Note Details Patient Name: [name] [name], [name] Date of Service: 09/20/2020 10:00 AM Medical Record Number: [id number] Patient Account Number: [id number] Date of Birth/Sex: 05-Jul-1965 (55 y.o. Female) Treating RN: [name] [name] Primary Care  Provider: SYSTEM, PCP Other Clinician: Referring Provider: Willy [name] Treating Provider/Extender: [name] [name] in Treatment: 0 Subjective Chief Complaint Information obtained from Patient Left leg traumatic ulcer History of Present Illness (HPI) 09/20/2020 upon evaluation today patient presents for initial inspection here in our clinic concerning issues that she has been having with a trauma wound with a skin tear on the anterior portion of her left leg after getting in a encounter with another resident at the facility at the [facility]. Subsequently she is having quite a bit of pain according to what she is telling me today. There does not appear to be any signs of active infection which is great news. With that being said I do see some signs of hypergranulation so I believe there is a little bit too much moisture going on I am not sure what dressing wound they are using as all she had on was basically just a protective dressing today nothing really more. Either way I think that she would actually do well with [facility] which is probably the best option currently based on what I am seeing. The surface otherwise looks good just a little bit hypergranular. She does have a history of seemingly dementia along with some other psychiatric conditions in general. She does have diabetes mellitus type 2 as well. Patient History Information obtained from Patient. Allergies latex Family History Diabetes - Mother. Social History Current every day smoker, Marital Status - Single, Alcohol Use - Never, Drug Use - No History, Caffeine Use - Never. Medical History Hematologic/Lymphatic Patient has history of Anemia Respiratory Patient has history of Asthma, Chronic Obstructive Pulmonary Disease (COPD) Endocrine Patient has history of Type II Diabetes - 5 years Musculoskeletal Patient has history of Osteoarthritis Patient is treated with Insulin. Blood sugar is tested. Medical  And Surgical History Notes Genitourinary CKD Psychiatric Depression Review of Systems (ROS) Ear/Nose/Mouth/Throat Denies complaints or symptoms of Difficult clearing ears, Sinusitis. Hematologic/Lymphatic Denies complaints or symptoms of Bleeding / Clotting Disorders, Human Immunodeficiency Virus. Respiratory Denies complaints or symptoms of Chronic or frequent coughs, Shortness of Breath. Cardiovascular Complains or has symptoms of LE edema. Gastrointestinal Denies complaints or symptoms of Frequent diarrhea, Nausea, Vomiting. Genitourinary Complains or has symptoms of Incontinence/dribbling. Immunological Denies complaints or symptoms of Hives, Itching. Integumentary (Skin) Complains or has symptoms of Wounds. Denies complaints or symptoms of Bleeding or bruising tendency. Musculoskeletal [name] [name], [name] ([id number]) Denies complaints or symptoms of Muscle Pain, Muscle Weakness. Neurologic Denies complaints or symptoms of Numbness/parasthesias, Focal/Weakness. Psychiatric Complains or has symptoms of Anxiety - medicated. Objective Constitutional sitting or standing blood pressure is within target range for patient.. [name] regular  and within target range for patient.Marland Kitchen respirations regular, non- labored and within target range for patient.Marland Kitchen temperature within target range for patient.. Well-nourished and well-hydrated in no acute distress. Vitals Time Taken: 10:16 AM, Height: 68 in, Weight: 216 lbs, BMI: 32.8, Temperature: 98.1 F, [name]: 80 bpm, Respiratory Rate: 16 breaths/min, Blood Pressure: 114/69 mmHg. Eyes conjunctiva clear no eyelid edema noted. pupils equal round and reactive to light and accommodation. Ears, Nose, Mouth, and Throat no gross abnormality of ear auricles or external auditory canals. normal hearing noted during conversation. mucus membranes moist. Respiratory normal breathing without difficulty. Cardiovascular 2+ dorsalis pedis/posterior tibialis  pulses. 1+ pitting edema of the bilateral lower extremities. Musculoskeletal normal gait and posture. no significant deformity or arthritic changes, no loss or range of motion, no clubbing. Psychiatric this patient is able to make decisions and demonstrates good insight into disease process. Alert and Oriented x 3. pleasant and cooperative. General Notes: Upon inspection patient's wound bed actually showed signs of good granulation epithelization at this point in some areas although there is a lot of hypergranular tissue I think this is good to be the main issue is managing that in order to get new tissue growth occurring here. With that being said I do think that we should go ahead and initiate treatment with a Hydrofera Blue dressing which is probably can to be the best way to go in general I am glad that she has good blood flow again I see no evidence of vascular compromise here which is also great news. Integumentary (Hair, Skin) Wound #1 status is Open. Original cause of wound was Trauma. The date acquired was: 08/30/2020. The wound is located on the Left,Midline,Anterior Lower Leg. The wound measures 5.5cm length x 3.8cm width x 0.3cm depth; 16.415cm^2 area and 4.924cm^3 volume. There is Fat Layer (Subcutaneous Tissue) exposed. There is no tunneling or undermining noted. There is a medium amount of serous drainage noted. The wound margin is flat and intact. There is medium (34-66%) red, pink granulation within the wound bed. There is a small (1-33%) amount of necrotic tissue within the wound bed including Adherent Slough. Assessment Active Problems ICD-10 Laceration without foreign body, left lower leg, initial encounter Non-pressure chronic ulcer of other part of left lower leg with fat layer exposed Type 2 diabetes mellitus with other skin ulcer Vascular dementia without behavioral disturbance Procedures Fore, Latrisa R. ([id number]) Wound #1 Pre-procedure diagnosis of Wound #1 is a  Trauma, Other located on the Left,Midline,Anterior Lower Leg . There was a Chemical/Enzymatic/Mechanical debridement performed by [name] Meuse., PA-C. With the following instrument(s): saline and gauze after achieving pain control using Lidocaine. Other agent used was saline and gauze. A time out was conducted at 10:45, prior to the start of the procedure. A Minimum amount of bleeding was controlled with Pressure. The procedure was tolerated well. Post Debridement Measurements: 5.5cm length x 3.8cm width x 0.3cm depth; 4.924cm^3 volume. Character of Wound/Ulcer Post Debridement is stable. Post procedure Diagnosis Wound #1: Same as Pre-Procedure Plan Follow-up Appointments: Wound #1 Left,Midline,Anterior Lower Leg: Return Appointment in 2 weeks. Bathing/ Shower/ Hygiene: May shower; gently cleanse wound with antibacterial soap, rinse and pat dry prior to dressing wounds - Do not leave dressing once wet. Anesthetic (Use 'Patient Medications' Section for Anesthetic Order Entry): Lidocaine applied to wound bed Edema Control - Lymphedema / Segmental Compressive Device / Other: Tubigrip single layer applied. - F WOUND #1: - Lower Leg Wound Laterality: Left, Midline, Anterior Primary Dressing: Hydrofera Blue Ready Transfer  Foam, 2.5x2.5 (in/in) Discharge Instructions: Apply Hydrofera Blue Ready to wound bed as directed Secondary Dressing: ABD Pad 5x9 (in/in) Discharge Instructions: Cover with ABD pad Secured With: Tubigrip Size F, 4x10 (in/yd) Discharge Instructions: Apply Tubigrip F 3 finger-widths below knee to base of toes to secure dressing and/or for swelling. 1. Would recommend that we going to initiate treatment with [facility] which I think is good to be the best way to go and the patient is in agreement the plan. 2. I am also can recommend that we have the patient going to continue with the ABD pad to cover along with the Tubigrip to secure in place to help a little bit with the  compression as well I think this also be something that does not actually hurt her any at all which is good news. 3. I am also can recommend she try to elevate her legs much as possible to help with edema control. We will see patient back for reevaluation in 1 week here in the clinic. If anything worsens or changes patient will contact our office for additional recommendations. Electronic Signature(s) Signed: 09/20/2020 12:01:12 PM By: [name] Kelp PA-C Entered By: [name] [name] on 09/20/2020 12:01:12 [name] [name], [name] [name] ([id number]) -------------------------------------------------------------------------------- ROS/PFSH Details Patient Name: [name] [name], [name] Date of Service: 09/20/2020 10:00 AM Medical Record Number: [id number] Patient Account Number: [id number] Date of Birth/Sex: 1965/02/24 (55 y.o. Female) Treating RN: [name] [name] Primary Care Provider: SYSTEM, PCP Other Clinician: Referring Provider: Willy [name] Treating Provider/Extender: [name] [name] in Treatment: 0 Information Obtained From Patient Ear/Nose/Mouth/Throat Complaints and Symptoms: Negative for: Difficult clearing ears; Sinusitis Hematologic/Lymphatic Complaints and Symptoms: Negative for: Bleeding / Clotting Disorders; Human Immunodeficiency Virus Medical History: Positive for: Anemia Respiratory Complaints and Symptoms: Negative for: Chronic or frequent coughs; Shortness of Breath Medical History: Positive for: Asthma; Chronic Obstructive Pulmonary Disease (COPD) Cardiovascular Complaints and Symptoms: Positive for: LE edema Gastrointestinal Complaints and Symptoms: Negative for: Frequent diarrhea; Nausea; Vomiting Genitourinary Complaints and Symptoms: Positive for: Incontinence/dribbling Medical History: Past Medical History Notes: CKD Immunological Complaints and Symptoms: Negative for: Hives; Itching Integumentary (Skin) Complaints and Symptoms: Positive for: Wounds Negative  for: Bleeding or bruising tendency Musculoskeletal Complaints and Symptoms: Negative for: Muscle Pain; Muscle Weakness Medical History: [name] [name], [name] ([id number]) Positive for: Osteoarthritis Neurologic Complaints and Symptoms: Negative for: Numbness/parasthesias; Focal/Weakness Psychiatric Complaints and Symptoms: Positive for: Anxiety - medicated Medical History: Past Medical History Notes: Depression Endocrine Medical History: Positive for: Type II Diabetes - 5 years Time with diabetes: 5 years Treated with: Insulin Blood sugar tested every day: Yes Tested : Oncologic Immunizations Pneumococcal Vaccine: Received Pneumococcal Vaccination: No Tetanus Vaccine: Last tetanus shot: 02/21/2019 Implantable Devices None Family and Social History Diabetes: Yes - Mother; Current every day smoker; Marital Status - Single; Alcohol Use: Never; Drug Use: No History; Caffeine Use: Never Electronic Signature(s) Signed: 09/20/2020 4:30:46 PM By: [name] Kelp PA-C Signed: 09/22/2020 5:43:31 PM By: [name] [name], BSN, RN, CWS, Kim RN, BSN Entered By: [name] [name], BSN, RN, CWS, [name] [name] on 09/20/2020 10:52:47 [name] [name], [name] [name] ([id number]) -------------------------------------------------------------------------------- SuperBill Details Patient Name: Saindon, Viridiana R. Date of Service: 09/20/2020 Medical Record Number: [id number] Patient Account Number: [id number] Date of Birth/Sex: 1965/06/20 (55 y.o. Female) Treating RN: [name] [name] Primary Care Provider: SYSTEM, PCP Other Clinician: Referring Provider: Willy [name] Treating Provider/Extender: [name] [name] in Treatment: 0 Diagnosis Coding ICD-10 Codes Code Description [phone] Laceration without foreign body, left lower leg, initial encounter L97.822 Non-pressure chronic ulcer of other part of left lower  leg with fat layer exposed E11.622 Type 2 diabetes mellitus with other skin ulcer F01.50 Vascular dementia without behavioral  disturbance Facility Procedures CPT4 Code: [id number] Description: 99214 - WOUND CARE VISIT-LEV 4 EST PT Modifier: Quantity: 1 CPT4 Code: 41324401 Description: 02725 - DEBRIDE W/O ANES NON SELECT Modifier: Quantity: 1 Physician Procedures CPT4 Code: 3664403 Description: 99204 - WC PHYS LEVEL 4 - NEW PT Modifier: Quantity: 1 CPT4 Code: Description: ICD-10 Diagnosis Description S81.812A Laceration without foreign body, left lower leg, initial encounter L97.822 Non-pressure chronic ulcer of other part of left lower leg with fat laye E11.622 Type 2 diabetes mellitus with other skin ulcer  F01.50 Vascular dementia without behavioral disturbance Modifier: r exposed Quantity: Electronic Signature(s) Signed: 09/20/2020 12:01:29 PM By: [name] Kelp PA-C Entered By: [name] [name] on 09/20/2020 12:01:29

## 2020-09-23 NOTE — Progress Notes (Signed)
[name] [name], [name] [name] ([id number]) Visit Report for 09/20/2020 Abuse/Suicide Risk Screen Details Patient Name: [name] [name], [name] [name]. Date of Service: 09/20/2020 10:00 AM Medical Record Number: [id number] Patient Account Number: [id number] Date of Birth/Sex: [birth year] (55 y.o. Female) Treating RN: Huel Coventry Primary Care Roque Schill: SYSTEM, PCP Other Clinician: Referring Lenora Gomes: Willy Eddy Treating Khiara Shuping/Extender: [name] in Treatment: 0 Abuse/Suicide Risk Screen Items Answer ABUSE RISK SCREEN: Has anyone close to you tried to hurt or harm you recentlyo No Do you feel uncomfortable with anyone in your familyo No Has anyone forced you do things that you didnot want to doo No Electronic Signature(s) Signed: 09/22/2020 5:43:31 PM By: Elliot Gurney, BSN, RN, CWS, Kim RN, BSN Entered By: Elliot Gurney, BSN, RN, CWS, Kim on 09/20/2020 10:21:38 Lacount, [name] ([id number]) -------------------------------------------------------------------------------- Activities of Daily Living Details Patient Name: [name], [name] Date of Service: 09/20/2020 10:00 AM Medical Record Number: [id number] Patient Account Number: [id number] Date of Birth/Sex: December 10, 1965 (55 y.o. Female) Treating RN: Huel Coventry Primary Care Eward Rutigliano: SYSTEM, PCP Other Clinician: Referring Dayvian Blixt: Willy Eddy Treating Niv Darley/Extender: [name] in Treatment: 0 Activities of Daily Living Items Answer Activities of Daily Living (Please select one for each item) Drive Automobile Not Able Take Medications Need Assistance Use Telephone Completely Able Care for Appearance Completely Able Use Toilet Completely Able Bath / Shower Completely Able Dress Self Completely Able Feed Self Completely Able Walk Completely Able Get In / Out Bed Completely Able Housework Need Assistance Prepare Meals Need Assistance Handle Money Need Assistance Shop for Self Need Assistance Electronic Signature(s) Signed: 09/22/2020  5:43:31 PM By: Elliot Gurney, BSN, RN, CWS, Kim RN, BSN Entered By: Elliot Gurney, BSN, RN, CWS, Kim on 09/20/2020 10:22:11 Van, [name] ([id number]) -------------------------------------------------------------------------------- Education Screening Details Patient Name: [name], [name] Date of Service: 09/20/2020 10:00 AM Medical Record Number: [id number] Patient Account Number: [id number] Date of Birth/Sex: 11-03-1965 (55 y.o. Female) Treating RN: Huel Coventry Primary Care Konnar Ben: SYSTEM, PCP Other Clinician: Referring Tequita Marrs: Willy Eddy Treating Trust Leh/Extender: [name] in Treatment: 0 Primary Learner Assessed: Patient Learning Preferences/Education Level/Primary Language Learning Preference: Explanation, Demonstration Highest Education Level: College or Above Preferred Language: English Cognitive Barrier Language Barrier: No Translator Needed: No Memory Deficit: No Emotional Barrier: No Physical Barrier Impaired Vision: No Impaired Hearing: No Decreased Hand dexterity: No Knowledge/Comprehension Knowledge Level: High Comprehension Level: High Ability to understand written instructions: High Ability to understand verbal instructions: High Motivation Anxiety Level: Anxious Cooperation: Cooperative Education Importance: Acknowledges Need Interest in Health Problems: Asks Questions Perception: Coherent Willingness to Engage in Self-Management High Activities: Readiness to Engage in Self-Management High Activities: Notes Patient grinding teeth and cannot be still in chair. Electronic Signature(s) Signed: 09/22/2020 5:43:31 PM By: Elliot Gurney, BSN, RN, CWS, Kim RN, BSN Entered By: Elliot Gurney, BSN, RN, CWS, Kim on 09/20/2020 10:22:55 Molesky, [name] ([id number]) -------------------------------------------------------------------------------- Fall Risk Assessment Details Patient Name: [name], [name] Date of Service: 09/20/2020 10:00 AM Medical Record Number:  [id number] Patient Account Number: [id number] Date of Birth/Sex: Jul 09, 1965 (55 y.o. Female) Treating RN: Huel Coventry Primary Care Brae Schaafsma: SYSTEM, PCP Other Clinician: Referring Eulogia Dismore: Willy Eddy Treating Briton Sellman/Extender: [name] in Treatment: 0 Fall Risk Assessment Items Have you had 2 or more falls in the last 12 monthso 0 Yes Have you had any fall that resulted in injury in the last 12 monthso 0 No FALLS RISK SCREEN History of falling - immediate or within 3 months 25 Yes Secondary diagnosis (Do you have 2 or more medical diagnoseso) 0 No Ambulatory aid None/bed  rest/wheelchair/nurse 0 No Crutches/cane/walker 15 Yes Furniture 0 No Intravenous therapy Access/Saline/Heparin Lock 0 No Gait/Transferring Normal/ bed rest/ wheelchair 0 No Weak (short steps with or without shuffle, stooped but able to lift head while walking, may 0 No seek support from furniture) Impaired (short steps with shuffle, may have difficulty arising from chair, head down, impaired 20 Yes balance) Mental Status Oriented to own ability 0 No Electronic Signature(s) Signed: 09/22/2020 5:43:31 PM By: Elliot Gurney, BSN, RN, CWS, Kim RN, BSN Entered By: Elliot Gurney, BSN, RN, CWS, Kim on 09/20/2020 10:23:27 Brook, [name] ([id number]) -------------------------------------------------------------------------------- Foot Assessment Details Patient Name: [name], [name] Date of Service: 09/20/2020 10:00 AM Medical Record Number: [id number] Patient Account Number: [id number] Date of Birth/Sex: 08-10-1965 (55 y.o. Female) Treating RN: Huel Coventry Primary Care Samarah Hogle: SYSTEM, PCP Other Clinician: Referring Haleema Vanderheyden: Willy Eddy Treating Othel Dicostanzo/Extender: [name] in Treatment: 0 Foot Assessment Items Site Locations + = Sensation present, - = Sensation absent, C = Callus, U = Ulcer R = Redness, W = Warmth, M = Maceration, PU = Pre-ulcerative lesion F = Fissure, S = Swelling, D =  Dryness Assessment Right: Left: Other Deformity: No No Prior Foot Ulcer: No No Prior Amputation: No No Charcot Joint: No No Ambulatory Status: Ambulatory With Help Assistance Device: Walker Gait: Surveyor, mining) Signed: 09/22/2020 5:43:31 PM By: Elliot Gurney, BSN, RN, CWS, Kim RN, BSN Entered By: Elliot Gurney, BSN, RN, CWS, Kim on 09/20/2020 10:28:23 Ellender, [name] ([id number]) -------------------------------------------------------------------------------- Nutrition Risk Screening Details Patient Name: Siegel, Idabell R. Date of Service: 09/20/2020 10:00 AM Medical Record Number: [id number] Patient Account Number: [id number] Date of Birth/Sex: 08/16/1965 (55 y.o. Female) Treating RN: Huel Coventry Primary Care Gilman Olazabal: SYSTEM, PCP Other Clinician: Referring Namiko Pritts: Willy Eddy Treating Destyn Schuyler/Extender: [name] in Treatment: 0 Height (in): 68 Weight (lbs): 216 Body Mass Index (BMI): 32.8 Nutrition Risk Screening Items Score Screening NUTRITION RISK SCREEN: I have an illness or condition that made me change the kind and/or amount of food I eat 0 No I eat fewer than two meals per day 0 No I eat few fruits and vegetables, or milk products 0 No I have three or more drinks of beer, liquor or wine almost every day 0 No I have tooth or mouth problems that make it hard for me to eat 0 No I don't always have enough money to buy the food I need 0 No I eat alone most of the time 0 No I take three or more different prescribed or over-the-counter drugs a day 1 Yes Without wanting to, I have lost or gained 10 pounds in the last six months 0 No I am not always physically able to shop, cook and/or feed myself 0 No Nutrition Protocols Good Risk Protocol Moderate Risk Protocol 0 Provide education on nutrition High Risk Proctocol Risk Level: Good Risk Score: 1 Electronic Signature(s) Signed: 09/22/2020 5:43:31 PM By: Elliot Gurney, BSN, RN, CWS, Kim RN, BSN Entered By: Elliot Gurney,  BSN, RN, CWS, Kim on 09/20/2020 10:23:42

## 2020-09-23 NOTE — Progress Notes (Signed)
GALILEE, PIERRON ([id number]) Visit Report for 09/20/2020 Allergy List Details Patient Name: Stanhope, BAKER KOGLER. Date of Service: 09/20/2020 10:00 AM Medical Record Number: [id number] Patient Account Number: [id number] Date of Birth/Sex: 1965-07-12 (55 y.o. Female) Treating RN: Huel Coventry Primary Care Symon Norwood: SYSTEM, PCP Other Clinician: Referring Trip Cavanagh: Willy Eddy Treating Roddrick Sharron/Extender: [name] in Treatment: 0 Allergies Active Allergies latex Allergy Notes Electronic Signature(s) Signed: 09/22/2020 5:43:31 PM By: Elliot Gurney, BSN, RN, CWS, Kim RN, BSN Entered By: Elliot Gurney, BSN, RN, CWS, Kim on 09/20/2020 10:17:25 Defilippo, [name] ([id number]) -------------------------------------------------------------------------------- Arrival Information Details Patient Name: Petron, Anuja R. Date of Service: 09/20/2020 10:00 AM Medical Record Number: [id number] Patient Account Number: [id number] Date of Birth/Sex: 1965/05/29 (55 y.o. Female) Treating RN: Huel Coventry Primary Care Ajay Strubel: SYSTEM, PCP Other Clinician: Referring Velena Keegan: Willy Eddy Treating Tywaun Hiltner/Extender: [name] in Treatment: 0 Visit Information Patient Arrived: Walker Arrival Time: 10:13 Accompanied By: Kaylyn Lim [facility] Transfer Assistance: Manual Patient Identification Verified: Yes Secondary Verification Process Yes Completed: Patient Has Alerts: Yes Patient Alerts: Type II Diabetic Electronic Signature(s) Signed: 09/22/2020 5:43:31 PM By: Elliot Gurney, BSN, RN, CWS, Kim RN, BSN Entered By: Elliot Gurney, BSN, RN, CWS, Kim on 09/20/2020 10:15:30 Blasko, [name] ([id number]) -------------------------------------------------------------------------------- Clinic Level of Care Assessment Details Patient Name: Pflug, Arta R. Date of Service: 09/20/2020 10:00 AM Medical Record Number: [id number] Patient Account Number: [id number] Date of Birth/Sex: December 10, 1965 (55 y.o.  Female) Treating RN: Huel Coventry Primary Care Efstathios Sawin: SYSTEM, PCP Other Clinician: Referring Crystin Lechtenberg: Willy Eddy Treating Cornelious Bartolucci/Extender: [name] in Treatment: 0 Clinic Level of Care Assessment Items TOOL 2 Quantity Score []  - Use when only an EandM is performed on the INITIAL visit 0 ASSESSMENTS - Nursing Assessment / Reassessment X - General Physical Exam (combine w/ comprehensive assessment (listed just below) when performed on new 1 20 pt. evals) X- 1 25 Comprehensive Assessment (HX, ROS, Risk Assessments, Wounds Hx, etc.) ASSESSMENTS - Wound and Skin Assessment / Reassessment X - Simple Wound Assessment / Reassessment - one wound 1 5 []  - 0 Complex Wound Assessment / Reassessment - multiple wounds []  - 0 Dermatologic / Skin Assessment (not related to wound area) ASSESSMENTS - Ostomy and/or Continence Assessment and Care []  - Incontinence Assessment and Management 0 []  - 0 Ostomy Care Assessment and Management (repouching, etc.) PROCESS - Coordination of Care X - Simple Patient / Family Education for ongoing care 1 15 []  - 0 Complex (extensive) Patient / Family Education for ongoing care X- 1 10 Staff obtains , Records, Test Results / Process Orders []  - 0 Staff telephones HHA, Nursing Homes / Clarify orders / etc []  - 0 Routine Transfer to another Facility (non-emergent condition) []  - 0 Routine Hospital Admission (non-emergent condition) X- 1 15 New Admissions / / Ordering NPWT, Apligraf, etc. []  - 0 Emergency Hospital Admission (emergent condition) X- 1 10 Simple Discharge Coordination []  - 0 Complex (extensive) Discharge Coordination PROCESS - Special Needs []  - Pediatric / Minor Patient Management 0 []  - 0 Isolation Patient Management []  - 0 Hearing / Language / Visual special needs []  - 0 Assessment of Community assistance (transportation, D/C planning, etc.) []  - 0 Additional assistance / Altered  mentation []  - 0 Support Surface(s) Assessment (bed, cushion, seat, etc.) INTERVENTIONS - Wound Cleansing / Measurement X - Wound Imaging (photographs - any number of wounds) 1 5 []  - 0 Wound Tracing (instead of photographs) X- 1 5 Simple Wound Measurement - one wound []  - 0 Complex Wound  Measurement - multiple wounds Saksa, Kinisha R. ([id number]) X- 1 5 Simple Wound Cleansing - one wound []  - 0 Complex Wound Cleansing - multiple wounds INTERVENTIONS - Wound Dressings []  - Small Wound Dressing one or multiple wounds 0 X- 1 15 Medium Wound Dressing one or multiple wounds []  - 0 Large Wound Dressing one or multiple wounds []  - 0 Application of Medications - injection INTERVENTIONS - Miscellaneous []  - External ear exam 0 []  - 0 Specimen Collection (cultures, biopsies, blood, body fluids, etc.) []  - 0 Specimen(s) / Culture(s) sent or taken to Lab for analysis []  - 0 Patient Transfer (multiple staff / Nurse, adultHoyer Lift / Similar devices) []  - 0 Simple Staple / Suture removal (25 or less) []  - 0 Complex Staple / Suture removal (26 or more) []  - 0 Hypo / Hyperglycemic Management (close monitor of Blood Glucose) X- 1 15 Ankle / Brachial Index (ABI) - do not check if billed separately Has the patient been seen at the hospital within the last three years: Yes Total Score: 145 Level Of Care: New/Established - Level 4 Electronic Signature(s) Signed: 09/22/2020 5:43:31 PM By: Elliot GurneyWoody, BSN, RN, CWS, Kim RN, BSN Entered By: Elliot GurneyWoody, BSN, RN, CWS, Kim on 09/20/2020 11:03:00 Cerone, Christie BeckersJENNIFER R. ([id number]) -------------------------------------------------------------------------------- Encounter Discharge Information Details Patient Name: Andrzejewski, Sundeep R. Date of Service: 09/20/2020 10:00 AM Medical Record Number: [id number] Patient Account Number: [id number] Date of Birth/Sex: 01-17-1966 (55 y.o. Female) Treating RN: Huel CoventryWoody, Kim Primary Care Davi Kroon: SYSTEM, PCP Other  Clinician: Referring Kiasia Chou: Willy EddyOBINSON, PATRICK Treating Caeleigh Prohaska/Extender: Rowan BlaseStone, Hoyt Weeks in Treatment: 0 Encounter Discharge Information Items Discharge Condition: Stable Ambulatory Status: Walker Discharge Destination: Skilled Nursing Facility Orders Sent: Yes Transportation: Private Auto Accompanied By: Victorino DikeJennifer Schedule Follow-up Appointment: Yes Clinical Summary of Care: Electronic Signature(s) Signed: 09/22/2020 5:43:31 PM By: Elliot GurneyWoody, BSN, RN, CWS, Kim RN, BSN Entered By: Elliot GurneyWoody, BSN, RN, CWS, Kim on 09/20/2020 11:04:26 Watkinson, Christie BeckersJENNIFER R. ([id number]) -------------------------------------------------------------------------------- Lower Extremity Assessment Details Patient Name: Poehler, Zurri R. Date of Service: 09/20/2020 10:00 AM Medical Record Number: [id number] Patient Account Number: [id number] Date of Birth/Sex: 01-17-1966 (55 y.o. Female) Treating RN: Huel CoventryWoody, Kim Primary Care Keisean Skowron: SYSTEM, PCP Other Clinician: Referring Shahara Hartsfield: Willy EddyOBINSON, PATRICK Treating Kerah Hardebeck/Extender: Rowan BlaseStone, Hoyt Weeks in Treatment: 0 Edema Assessment Assessed: [Left: No] [Right: No] [Left: Edema] [Right: :] Calf Left: Right: Point of Measurement: 36 cm From Medial Instep 40 cm Ankle Left: Right: Point of Measurement: 11 cm From Medial Instep 25.2 cm Knee To Floor Left: Right: From Medial Instep 43 cm Vascular Assessment Pulses: Dorsalis Pedis Palpable: [Left:Yes] Doppler Audible: [Left:Yes] Posterior Tibial Palpable: [Left:Yes] Doppler Audible: [Left:Yes] Blood Pressure: Brachial: [Left:118] Dorsalis Pedis: 140 Ankle: Posterior Tibial: 118 Ankle Brachial Index: [Left:1.19] Electronic Signature(s) Signed: 09/22/2020 5:43:31 PM By: Elliot GurneyWoody, BSN, RN, CWS, Kim RN, BSN Entered By: Elliot GurneyWoody, BSN, RN, CWS, Kim on 09/20/2020 10:38:10 Klepper, Christie BeckersJENNIFER R. ([id number]) -------------------------------------------------------------------------------- Multi Wound Chart Details Patient  Name: Schaner, Trissa R. Date of Service: 09/20/2020 10:00 AM Medical Record Number: [id number] Patient Account Number: [id number] Date of Birth/Sex: 01-17-1966 (55 y.o. Female) Treating RN: Huel CoventryWoody, Kim Primary Care Burton Gahan: SYSTEM, PCP Other Clinician: Referring Charon Smedberg: Willy EddyOBINSON, PATRICK Treating Cadie Sorci/Extender: Rowan BlaseStone, Hoyt Weeks in Treatment: 0 Vital Signs Height(in): 68 Pulse(bpm): 80 Weight(lbs): 216 Blood Pressure(mmHg): 114/69 Body Mass Index(BMI): 33 Temperature(F): 98.1 Respiratory Rate(breaths/min): 16 Photos: [N/A:N/A] Wound Location: Left, Midline, Anterior Lower Leg N/A N/A Wounding Event: Trauma N/A N/A Primary Etiology: Trauma, Other N/A N/A Comorbid History: Type II Diabetes N/A N/A Date Acquired: 08/30/2020 N/A N/A  Weeks of Treatment: 0 N/A N/A Wound Status: Open N/A N/A Measurements L x W x D (cm) 5.5x3.8x0.3 N/A N/A Area (cm) : 16.415 N/A N/A Volume (cm) : 4.924 N/A N/A % Reduction in Area: 0.00% N/A N/A % Reduction in Volume: 0.00% N/A N/A Classification: Full Thickness Without Exposed N/A N/A Support Structures Exudate Amount: Medium N/A N/A Exudate Type: Serous N/A N/A Exudate Color: amber N/A N/A Wound Margin: Flat and Intact N/A N/A Granulation Amount: Medium (34-66%) N/A N/A Granulation Quality: Red, Pink N/A N/A Necrotic Amount: Small (1-33%) N/A N/A Exposed Structures: Fat Layer (Subcutaneous Tissue): N/A N/A Yes Fascia: No Tendon: No Muscle: No Joint: No Bone: No Epithelialization: None N/A N/A Treatment Notes Electronic Signature(s) Signed: 09/22/2020 5:43:31 PM By: Elliot Gurney, BSN, RN, CWS, Kim RN, BSN Entered By: Elliot Gurney, BSN, RN, CWS, Kim on 09/20/2020 10:41:00 Cullers, [name] ([id number]) -------------------------------------------------------------------------------- Multi-Disciplinary Care Plan Details Patient Name: Poteet, Zyaire R. Date of Service: 09/20/2020 10:00 AM Medical Record Number: [id number] Patient Account Number:  [id number] Date of Birth/Sex: 1966-01-04 (55 y.o. Female) Treating RN: Huel Coventry Primary Care Nancye Grumbine: SYSTEM, PCP Other Clinician: Referring Alfredo Spong: Willy Eddy Treating Marcelino Campos/Extender: [name] in Treatment: 0 Active Inactive Abuse / Safety / Falls / Self Care Management Nursing Diagnoses: History of Falls Potential for falls Goals: Patient will not experience any injury related to falls Date Initiated: 09/20/2020 Target Resolution Date: 10/20/2020 Goal Status: Active Interventions: Podiatry chair, stretcher in low position and side rails up as needed Provide education on safe transfers Notes: Medication Nursing Diagnoses: Knowledge deficit related to medication safety: actual or potential Goals: Patient/caregiver will demonstrate understanding of all current medications Date Initiated: 09/20/2020 Target Resolution Date: 10/20/2020 Goal Status: Active Patient/caregiver will demonstrate understanding of new oral/IV medications prescribed at the [facility] (topical prescriptions are covered under the skin breakdown problem) Date Initiated: 09/20/2020 Target Resolution Date: 10/20/2020 Goal Status: Active Interventions: Assess for medication contraindications each visit where new medications are prescribed Patient/Caregiver given reconciled medication list upon admission, changes in medications and discharge from the Wound Center Notes: Orientation to the Wound Care Program Nursing Diagnoses: Knowledge deficit related to the wound healing center program Goals: Patient/caregiver will verbalize understanding of the Wound Healing Center Program Date Initiated: 09/20/2020 Target Resolution Date: 10/20/2020 Goal Status: Active Interventions: Provide education on orientation to the wound center Notes: Pain, Acute or Chronic Henton, Margarite R. ([id number]) Nursing Diagnoses: Pain Management - Non-cyclic Chronic Pain Pain, acute or chronic: actual or potential Potential  alteration in comfort, pain Goals: Patient will verbalize adequate pain control and receive pain control interventions during procedures as needed Date Initiated: 09/20/2020 Target Resolution Date: 10/20/2020 Goal Status: Active Interventions: Assess comfort goal upon admission Treatment Activities: Administer pain control measures as ordered : 09/20/2020 Notes: Soft Tissue Infection Nursing Diagnoses: Impaired tissue integrity Knowledge deficit related to disease process and management Knowledge deficit related to home infection control: handwashing, handling of soiled dressings, supply storage Goals: Patient will remain free of wound infection Date Initiated: 09/20/2020 Target Resolution Date: 10/20/2020 Goal Status: Active Patient/caregiver will verbalize understanding of or measures to prevent infection and contamination in the home setting Date Initiated: 09/20/2020 Target Resolution Date: 10/20/2020 Goal Status: Active Signs and symptoms of infection will be recognized early to allow for prompt treatment Date Initiated: 09/20/2020 Target Resolution Date: 10/20/2020 Goal Status: Active Interventions: Assess signs and symptoms of infection every visit Provide education on infection Notes: Venous Leg Ulcer Nursing Diagnoses: Actual venous Insuffiency (use after diagnosis is confirmed) Knowledge deficit  related to disease process and management Potential for venous Insuffiency (use before diagnosis confirmed) Goals: Patient will maintain optimal edema control Date Initiated: 09/20/2020 Target Resolution Date: 10/20/2020 Goal Status: Active Interventions: Provide education on venous insufficiency Notes: Wound/Skin Impairment Nursing Diagnoses: Impaired tissue integrity Goals: Patient/caregiver will verbalize understanding of skin care regimen Date Initiated: 09/20/2020 Target Resolution Date: 10/20/2020 VALERIE, CONES ([id number]) Goal Status: Active Ulcer/skin breakdown will  have a volume reduction of 30% by week 4 Date Initiated: 09/20/2020 Target Resolution Date: 10/20/2020 Goal Status: Active Interventions: Assess patient/caregiver ability to obtain necessary supplies Assess ulceration(s) every visit Treatment Activities: Skin care regimen initiated : 09/20/2020 Topical wound management initiated : 09/20/2020 Notes: Electronic Signature(s) Signed: 09/22/2020 5:43:31 PM By: Elliot Gurney, BSN, RN, CWS, Kim RN, BSN Entered By: Elliot Gurney, BSN, RN, CWS, Kim on 09/20/2020 10:40:46 Penner, [name] ([id number]) -------------------------------------------------------------------------------- Pain Assessment Details Patient Name: Costanza, Virdia R. Date of Service: 09/20/2020 10:00 AM Medical Record Number: [id number] Patient Account Number: [id number] Date of Birth/Sex: Jul 19, 1965 (55 y.o. Female) Treating RN: Huel Coventry Primary Care Breeann Reposa: SYSTEM, PCP Other Clinician: Referring Pamalee Marcoe: Willy Eddy Treating Wyett Narine/Extender: [name] in Treatment: 0 Active Problems Location of Pain Severity and Description of Pain Patient Has Paino Yes Site Locations Pain Location: Pain in Ulcers Rate the pain. Current Pain Level: 8 Character of Pain Describe the Pain: Sharp, Shooting Pain Management and Medication Current Pain Management: Electronic Signature(s) Signed: 09/22/2020 5:43:31 PM By: Elliot Gurney, BSN, RN, CWS, Kim RN, BSN Entered By: Elliot Gurney, BSN, RN, CWS, Kim on 09/20/2020 10:15:57 Zurawski, [name] ([id number]) -------------------------------------------------------------------------------- Patient/Caregiver Education Details Patient Name: Riffe, Loryn R. Date of Service: 09/20/2020 10:00 AM Medical Record Number: [id number] Patient Account Number: [id number] Date of Birth/Gender: [birth year] (55 y.o. Female) Treating RN: Huel Coventry Primary Care Physician: SYSTEM, PCP Other Clinician: Referring Physician: Willy Eddy Treating Physician/Extender:  [name] in Treatment: 0 Education Assessment Education Provided To: Patient Education Topics Provided Safety: Handouts: Safety Methods: Demonstration, Explain/Verbal Responses: State content correctly Welcome To The Wound Care Center: Methods: Demonstration, Explain/Verbal Responses: State content correctly Electronic Signature(s) Signed: 09/22/2020 5:43:31 PM By: Elliot Gurney, BSN, RN, CWS, Kim RN, BSN Entered By: Elliot Gurney, BSN, RN, CWS, Kim on 09/20/2020 11:03:31 Carchi, [name] ([id number]) -------------------------------------------------------------------------------- Wound Assessment Details Patient Name: Oquin, Kaaliyah R. Date of Service: 09/20/2020 10:00 AM Medical Record Number: [id number] Patient Account Number: [id number] Date of Birth/Sex: 04-Jun-1965 (55 y.o. Female) Treating RN: Huel Coventry Primary Care Joan Herschberger: SYSTEM, PCP Other Clinician: Referring Jaremy Nosal: Willy Eddy Treating Idaliz Tinkle/Extender: [name] in Treatment: 0 Wound Status Wound Number: 1 Primary Etiology: Trauma, Other Wound Location: Left, Midline, Anterior Lower Leg Wound Status: Open Wounding Event: Trauma Comorbid History: Type II Diabetes Date Acquired: 08/30/2020 Weeks Of Treatment: 0 Clustered Wound: No Photos Wound Measurements Length: (cm) 5.5 Width: (cm) 3.8 Depth: (cm) 0.3 Area: (cm) 16.415 Volume: (cm) 4.924 % Reduction in Area: 0% % Reduction in Volume: 0% Epithelialization: None Tunneling: No Undermining: No Wound Description Classification: Full Thickness Without Exposed Support Structu Wound Margin: Flat and Intact Exudate Amount: Medium Exudate Type: Serous Exudate Color: amber res Foul Odor After Cleansing: No Slough/Fibrino Yes Wound Bed Granulation Amount: Medium (34-66%) Exposed Structure Granulation Quality: Red, Pink Fascia Exposed: No Necrotic Amount: Small (1-33%) Fat Layer (Subcutaneous Tissue) Exposed: Yes Necrotic Quality: Adherent  Slough Tendon Exposed: No Muscle Exposed: No Joint Exposed: No Bone Exposed: No Treatment Notes Wound #1 (Lower Leg) Wound Laterality: Left, Midline, Anterior Cleanser Peri-Wound Care Topical Wrobleski, Rexanna R. ([id number])  Primary Dressing Hydrofera Blue Ready Transfer Foam, 2.5x2.5 (in/in) Discharge Instruction: Apply Hydrofera Blue Ready to wound bed as directed Secondary Dressing ABD Pad 5x9 (in/in) Discharge Instruction: Cover with ABD pad Secured With Tubigrip Size F, 4x10 (in/yd) Discharge Instruction: Apply Tubigrip F 3 finger-widths below knee to base of toes to secure dressing and/or for swelling. Compression Wrap Compression Stockings Add-Ons Electronic Signature(s) Signed: 09/22/2020 5:43:31 PM By: Elliot Gurney, BSN, RN, CWS, Kim RN, BSN Entered By: Elliot Gurney, BSN, RN, CWS, Kim on 09/20/2020 10:30:14 Clinkscales, [name] ([id number]) -------------------------------------------------------------------------------- Vitals Details Patient Name: Steve, Leyli R. Date of Service: 09/20/2020 10:00 AM Medical Record Number: [id number] Patient Account Number: [id number] Date of Birth/Sex: 12-Oct-1965 (55 y.o. Female) Treating RN: Huel Coventry Primary Care Mariusz Jubb: SYSTEM, PCP Other Clinician: Referring Amiley Shishido: Willy Eddy Treating Talyia Allende/Extender: [name] in Treatment: 0 Vital Signs Time Taken: 10:16 Temperature (F): 98.1 Height (in): 68 Pulse (bpm): 80 Weight (lbs): 216 Respiratory Rate (breaths/min): 16 Body Mass Index (BMI): 32.8 Blood Pressure (mmHg): 114/69 Reference Range: 80 - 120 mg / dl Electronic Signature(s) Signed: 09/22/2020 5:43:31 PM By: Elliot Gurney, BSN, RN, CWS, Kim RN, BSN Entered By: Elliot Gurney, BSN, RN, CWS, Kim on 09/20/2020 10:16:21

## 2020-10-01 ENCOUNTER — Emergency Department
Admission: EM | Admit: 2020-10-01 | Discharge: 2020-10-01 | Disposition: A | Discharge status: Home / Self Care | Payer: Medicaid Other | Attending: Emergency Medicine | Admitting: Emergency Medicine

## 2020-10-01 ENCOUNTER — Other Ambulatory Visit: Payer: Self-pay

## 2020-10-01 ENCOUNTER — Emergency Department: Payer: Medicaid Other

## 2020-10-01 ENCOUNTER — Encounter: Payer: Self-pay | Admitting: Emergency Medicine

## 2020-10-01 DIAGNOSIS — Z79899 Other long term (current) drug therapy: Secondary | ICD-10-CM | POA: Diagnosis not present

## 2020-10-01 DIAGNOSIS — Z7902 Long term (current) use of antithrombotics/antiplatelets: Secondary | ICD-10-CM | POA: Insufficient documentation

## 2020-10-01 DIAGNOSIS — Z96651 Presence of right artificial knee joint: Secondary | ICD-10-CM | POA: Insufficient documentation

## 2020-10-01 DIAGNOSIS — E1169 Type 2 diabetes mellitus with other specified complication: Secondary | ICD-10-CM | POA: Diagnosis not present

## 2020-10-01 DIAGNOSIS — Z7952 Long term (current) use of systemic steroids: Secondary | ICD-10-CM | POA: Insufficient documentation

## 2020-10-01 DIAGNOSIS — Z91040 Latex allergy status: Secondary | ICD-10-CM | POA: Diagnosis not present

## 2020-10-01 DIAGNOSIS — Z7982 Long term (current) use of aspirin: Secondary | ICD-10-CM | POA: Insufficient documentation

## 2020-10-01 DIAGNOSIS — E785 Hyperlipidemia, unspecified: Secondary | ICD-10-CM | POA: Diagnosis not present

## 2020-10-01 DIAGNOSIS — S81802A Unspecified open wound, left lower leg, initial encounter: Secondary | ICD-10-CM

## 2020-10-01 DIAGNOSIS — J449 Chronic obstructive pulmonary disease, unspecified: Secondary | ICD-10-CM | POA: Insufficient documentation

## 2020-10-01 DIAGNOSIS — X58XXXA Exposure to other specified factors, initial encounter: Secondary | ICD-10-CM | POA: Diagnosis not present

## 2020-10-01 DIAGNOSIS — E1122 Type 2 diabetes mellitus with diabetic chronic kidney disease: Secondary | ICD-10-CM | POA: Insufficient documentation

## 2020-10-01 DIAGNOSIS — I129 Hypertensive chronic kidney disease with stage 1 through stage 4 chronic kidney disease, or unspecified chronic kidney disease: Secondary | ICD-10-CM | POA: Diagnosis not present

## 2020-10-01 DIAGNOSIS — M79605 Pain in left leg: Secondary | ICD-10-CM | POA: Diagnosis not present

## 2020-10-01 DIAGNOSIS — F17210 Nicotine dependence, cigarettes, uncomplicated: Secondary | ICD-10-CM | POA: Diagnosis not present

## 2020-10-01 DIAGNOSIS — S8992XA Unspecified injury of left lower leg, initial encounter: Secondary | ICD-10-CM | POA: Diagnosis present

## 2020-10-01 DIAGNOSIS — N183 Chronic kidney disease, stage 3 unspecified: Secondary | ICD-10-CM | POA: Insufficient documentation

## 2020-10-01 MED ORDER — OXYCODONE HCL 5 MG PO TABS
5.0000 mg | ORAL_TABLET | Freq: Once | ORAL | Status: AC
  Administered 2020-10-01: 5 mg via ORAL
  Filled 2020-10-01: qty 1

## 2020-10-01 MED ORDER — ACETAMINOPHEN 500 MG PO TABS
1000.0000 mg | ORAL_TABLET | Freq: Once | ORAL | Status: AC
  Administered 2020-10-01: 1000 mg via ORAL
  Filled 2020-10-01: qty 2

## 2020-10-01 NOTE — ED Notes (Signed)
Wound care down to left lower leg  Pt was incont of urine   Pt cleaned up and purwick applied

## 2020-10-01 NOTE — ED Notes (Signed)
This tech provided pt with a [place] tray and drink.
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
## 2020-10-01 NOTE — ED Triage Notes (Signed)
Pt comes into the ED via [facility] from the [facility].  Pt c/o "knot" behind the left knee that has been there a couple weeks.  No change in pain level, all VS stable, 152/85, 73 HR, 97.2 oral. CBG 132.

## 2020-10-04 ENCOUNTER — Ambulatory Visit: Payer: Medicaid Other | Admitting: Physician Assistant

## 2020-10-11 ENCOUNTER — Encounter: Payer: Medicaid Other | Admitting: Physician Assistant

## 2020-10-11 ENCOUNTER — Other Ambulatory Visit: Payer: Self-pay

## 2020-10-11 DIAGNOSIS — E11622 Type 2 diabetes mellitus with other skin ulcer: Secondary | ICD-10-CM | POA: Diagnosis not present

## 2020-10-11 NOTE — Progress Notes (Addendum)
[name] [name] ([id number]) Visit Report for 10/11/2020 Chief Complaint Document Details Patient Name: [name] [name], [name] [name]. Date of Service: 10/11/2020 10:45 AM Medical Record Number: [id number] Patient Account Number: [id number] Date of Birth/Sex: 10/14/1965 (55 y.o. F) Treating RN: Rogers Blocker Primary Care Provider: SYSTEM, PCP Other Clinician: Referring Provider: Willy Eddy Treating Provider/Extender: [name] in Treatment: 3 Information Obtained from: Patient Chief Complaint Left leg traumatic ulcer Electronic Signature(s) Signed: 10/11/2020 11:17:02 AM By: Lenda Kelp PA-C Entered By: Lenda Kelp on 10/11/2020 11:17:02 Detjen, [name] ([id number]) -------------------------------------------------------------------------------- HPI Details Patient Name: [name], [name] Date of Service: 10/11/2020 10:45 AM Medical Record Number: [id number] Patient Account Number: [id number] Date of Birth/Sex: 04/19/1965 (55 y.o. F) Treating RN: Rogers Blocker Primary Care Provider: SYSTEM, PCP Other Clinician: Referring Provider: Willy Eddy Treating Provider/Extender: [name] in Treatment: 3 History of Present Illness HPI Description: 09/20/2020 upon evaluation today patient presents for initial inspection here in our clinic concerning issues that she has been having with a trauma wound with a skin tear on the anterior portion of her left leg after getting in a encounter with another resident at the facility at the [facility]. Subsequently she is having quite a bit of pain according to what she is telling me today. There does not appear to be any signs of active infection which is great news. With that being said I do see some signs of hypergranulation so I believe there is a little bit too much moisture going on I am not sure what dressing wound they are using as all she had on was basically just a protective dressing today nothing really  more. Either way I think that she would actually do well with [facility] which is probably the best option currently based on what I am seeing. The surface otherwise looks good just a little bit hypergranular. She does have a history of seemingly dementia along with some other psychiatric conditions in general. She does have diabetes mellitus type 2 as well. 10/11/2020 upon evaluation today patient appears to be doing decently well in regard to her wound. Fortunately there does not appear to be any signs of active infection which is great news. The biggest issue is that the [facility] was put on backwards. Unfortunately this was not the ready transfer and therefore has a back which does not allow any absorption this did not do too well for the patient to be perfectly honest. Nonetheless the wound does not look worse but there is still some hypergranulation I think that silver nitrate will be helpful today. Electronic Signature(s) Signed: 10/11/2020 5:08:15 PM By: Lenda Kelp PA-C Entered By: Lenda Kelp on 10/11/2020 17:08:15 Verma, [name] ([id number]) -------------------------------------------------------------------------------- Gaynelle Adu TISS Details Patient Name: [name], [name] Date of Service: 10/11/2020 10:45 AM Medical Record Number: [id number] Patient Account Number: [id number] Date of Birth/Sex: 04-11-1965 (55 y.o. F) Treating RN: Rogers Blocker Primary Care Provider: SYSTEM, PCP Other Clinician: Referring Provider: Willy Eddy Treating Provider/Extender: [name] in Treatment: 3 Procedure Performed for: Wound #1 Left,Midline,Anterior Lower Leg Performed By: Physician Nelida Meuse., PA-C Post Procedure Diagnosis Same as Pre-procedure Notes 1 silver nitrate stick used Electronic Signature(s) Signed: 10/11/2020 4:26:23 PM By: Rogers Blocker RN Entered By: Rogers Blocker on 10/11/2020 11:21:37 Livesay, [name]  ([id number]) -------------------------------------------------------------------------------- Physical Exam Details Patient Name: [name], [name] Date of Service: 10/11/2020 10:45 AM Medical Record Number: [id number] Patient Account Number: [id number] Date of Birth/Sex: Dec 28, 1965 (  55 y.o. F) Treating RN: Rogers Blocker Primary Care Provider: SYSTEM, PCP Other Clinician: Referring Provider: Willy Eddy Treating Provider/Extender: [name] in Treatment: 3 Constitutional Well-nourished and well-hydrated in no acute distress. Respiratory normal breathing without difficulty. Psychiatric this patient is able to make decisions and demonstrates good insight into disease process. Alert and Oriented x 3. pleasant and cooperative. Notes I did perform chemical cauterization with silver nitrate and 1 stick total in order to help with some of the hypergranular tissue I think this will benefit the patient quite significantly based on what I am seeing today. Electronic Signature(s) Signed: 10/11/2020 5:08:34 PM By: Lenda Kelp PA-C Entered By: Lenda Kelp on 10/11/2020 17:08:34 Pang, [name] ([id number]) -------------------------------------------------------------------------------- Physician Orders Details Patient Name: [name], [name] Date of Service: 10/11/2020 10:45 AM Medical Record Number: [id number] Patient Account Number: [id number] Date of Birth/Sex: 1965/10/21 (55 y.o. F) Treating RN: Rogers Blocker Primary Care Provider: SYSTEM, PCP Other Clinician: Referring Provider: Willy Eddy Treating Provider/Extender: [name] in Treatment: 3 Verbal / Phone Orders: No Diagnosis Coding ICD-10 Coding Code Description [phone] Laceration without foreign body, left lower leg, initial encounter L97.822 Non-pressure chronic ulcer of other part of left lower leg with fat layer exposed E11.622 Type 2 diabetes mellitus with other skin ulcer F01.50  Vascular dementia without behavioral disturbance Follow-up Appointments o Return Appointment in 1 week. o Nurse Visit as needed - call to set up if needed Home Health o Home Health Company: - [facility] o [facility] for wound care. May utilize formulary equivalent dressing for wound treatment orders unless otherwise specified. Home Health Nurse may visit PRN to address patientos wound care needs. o Scheduled days for dressing changes to be completed; exception, patient has scheduled wound care visit that day. o **Please direct any NON-WOUND related issues/requests for orders to patient's Primary Care Physician. **If current dressing causes regression in wound condition, may D/C ordered dressing product/s and apply Normal Saline Moist Dressing daily until next Wound Healing Center or Other MD appointment. **Notify Wound Healing Center of regression in wound condition at [phone]. Bathing/ Shower/ Hygiene o May shower with wound dressing protected with water repellent cover or cast protector. - DO NOT GET COMPRESSION WRAP WET Anesthetic (Use 'Patient Medications' Section for Anesthetic Order Entry) o Lidocaine applied to wound bed Edema Control - Lymphedema / Segmental Compressive Device / Other o Optional: One layer of unna paste to top of compression wrap (to act as an anchor). Wound Treatment Wound #1 - Lower Leg Wound Laterality: Left, Midline, Anterior Cleanser: Soap and Water 2 x Per Week/30 Days Discharge Instructions: Gently cleanse wound with antibacterial soap, rinse and pat dry prior to dressing wounds Primary Dressing: Hydrofera Blue Ready Transfer Foam, 2.5x2.5 (in/in) 2 x Per Week/30 Days Discharge Instructions: Apply Hydrofera Blue Ready to wound bed as directed-cut hydrofera big enough to cover wound bed AND catch drainage. IF USING HYDROFERA CLASSIC, PLASTIC SIDE DOES NOT TOUCH WOUND BED. Please call office for clarification if  needed. Secondary Dressing: ABD Pad 5x9 (in/in) 2 x Per Week/30 Days Discharge Instructions: Cover with ABD pad Compression Wrap: Profore Lite LF 3 Multilayer Compression Bandaging System 2 x Per Week/30 Days Discharge Instructions: Apply 3 multi-layer wrap as prescribed. Electronic Signature(s) Signed: 10/11/2020 4:26:23 PM By: Rogers Blocker RN Signed: 10/11/2020 5:15:29 PM By: Lenda Kelp PA-C Entered By: Rogers Blocker on 10/11/2020 11:26:58 Pooler, CAMY LEDER ([id number]) Ardolino, [name] ([id number]) -------------------------------------------------------------------------------- Problem List Details Patient Name:  Hancher, Taura R. Date of Service: 10/11/2020 10:45 AM Medical Record Number: [id number] Patient Account Number: [id number] Date of Birth/Sex: [birth year] (55 y.o. F) Treating RN: Rogers BlockerSanchez, Kenia Primary Care Provider: SYSTEM, PCP Other Clinician: Referring Provider: Willy EddyOBINSON, PATRICK Treating Provider/Extender: Rowan BlaseStone, Yahmir Sokolov Weeks in Treatment: 3 Active Problems ICD-10 Encounter Code Description Active Date MDM Diagnosis S81.812A Laceration without foreign body, left lower leg, initial encounter 09/20/2020 No Yes L97.822 Non-pressure chronic ulcer of other part of left lower leg with fat layer 09/20/2020 No Yes exposed E11.622 Type 2 diabetes mellitus with other skin ulcer 09/20/2020 No Yes F01.50 Vascular dementia without behavioral disturbance 09/20/2020 No Yes Inactive Problems Resolved Problems Electronic Signature(s) Signed: 10/11/2020 11:16:57 AM By: Lenda KelpStone III, Jannell Franta PA-C Entered By: Lenda KelpStone III, Sayvion Vigen on 10/11/2020 11:16:56 Abarca, Christie BeckersJENNIFER R. ([id number]) -------------------------------------------------------------------------------- Progress Note Details Patient Name: Altemose, Odaliz R. Date of Service: 10/11/2020 10:45 AM Medical Record Number: [id number] Patient Account Number: [id number] Date of Birth/Sex: [birth year] (55 y.o. F) Treating RN: Rogers BlockerSanchez,  Kenia Primary Care Provider: SYSTEM, PCP Other Clinician: Referring Provider: Willy EddyOBINSON, PATRICK Treating Provider/Extender: Rowan BlaseStone, Arsal Tappan Weeks in Treatment: 3 Subjective Chief Complaint Information obtained from Patient Left leg traumatic ulcer History of Present Illness (HPI) 09/20/2020 upon evaluation today patient presents for initial inspection here in our clinic concerning issues that she has been having with a trauma wound with a skin tear on the anterior portion of her left leg after getting in a encounter with another resident at [facility]. Subsequently she is having quite a bit of pain according to what she is telling me today. There does not appear to be any signs of active infection which is great news. With that being said I do see some signs of hypergranulation so I believe there is a little bit too much moisture going on I am not sure what dressing wound they are using as all she had on was basically just a protective dressing today nothing really more. Either way I think that she [facility] which is probably the best option currently based on what I am seeing. The surface otherwise looks good just a little bit hypergranular. She does have a history of seemingly dementia along with some other psychiatric conditions in general. She does have diabetes mellitus type 2 as well. 10/11/2020 upon evaluation today patient appears to be doing decently well in regard to her wound. Fortunately there does not appear to be any signs of active infection which is great news. The [facility]. Unfortunately this was not the ready transfer and therefore has a back which does not allow any absorption this did not do too well for the patient to be perfectly honest. Nonetheless the wound does not look worse but there is still some hypergranulation I think that silver nitrate will be helpful  today. Objective Constitutional Well-nourished and well-hydrated in no acute distress. Vitals Time Taken: 11:05 AM, Height: 68 in, Weight: 216 lbs, BMI: 32.8, Temperature: 98.6 F, Pulse: 73 bpm, Respiratory Rate: 18 breaths/min, Blood Pressure: 145/75 mmHg. Respiratory normal breathing without difficulty. Psychiatric this patient is able to make decisions and demonstrates good insight into disease process. Alert and Oriented x 3. pleasant and cooperative. General Notes: I did perform chemical cauterization with silver nitrate and 1 stick total in order to help with some of the hypergranular tissue I think this will benefit the patient quite significantly based on what I am  seeing today. Integumentary (Hair, Skin) Wound #1 status is Open. Original cause of wound was Trauma. The date acquired was: 08/30/2020. The wound has been in treatment 3 weeks. The wound is located on the Left,Midline,Anterior Lower Leg. The wound measures 4.5cm length x 2.8cm width x 0.2cm depth; 9.896cm^2 area and 1.979cm^3 volume. There is Fat Layer (Subcutaneous Tissue) exposed. There is no tunneling or undermining noted. There is a medium amount of serosanguineous drainage noted. The wound margin is flat and intact. There is large (67-100%) red, hyper - granulation within the wound bed. There is a small (1-33%) amount of necrotic tissue within the wound bed including Adherent Slough. Assessment Active Problems ICD-10 Laceration without foreign body, left lower leg, initial encounter Dutton, Trevor R. ([id number]) Non-pressure chronic ulcer of other part of left lower leg with fat layer exposed Type 2 diabetes mellitus with other skin ulcer Vascular dementia without behavioral disturbance Procedures Wound #1 Pre-procedure diagnosis of Wound #1 is a Trauma, Other located on the Left,Midline,Anterior Lower Leg . There was a Three Layer Compression Therapy Procedure with a pre-treatment ABI of 1.2 by Rogers Blocker, RN. Post procedure Diagnosis Wound #1: Same as Pre-Procedure Pre-procedure diagnosis of Wound #1 is a Trauma, Other located on the Left,Midline,Anterior Lower Leg . An CHEM CAUT GRANULATION TISS procedure was performed by Nelida Meuse., PA-C. Post procedure Diagnosis Wound #1: Same as Pre-Procedure Notes: 1 silver nitrate stick used Plan Follow-up Appointments: Return Appointment in 1 week. Nurse Visit as needed - call to set up if needed Home Health: Home Health Company: - [facility] for wound care. May utilize formulary equivalent dressing for wound treatment orders unless otherwise specified. Home Health Nurse may visit PRN to address patient s wound care needs. Scheduled days for dressing changes to be completed; exception, patient has scheduled wound care visit that day. **Please direct any NON-WOUND related issues/requests for orders to patient's Primary Care Physician. **If current dressing causes regression in wound condition, may D/C ordered dressing product/s and apply Normal Saline Moist Dressing daily until next Wound Healing Center or Other MD appointment. **Notify Wound Healing Center of regression in wound condition at [phone]. Bathing/ Shower/ Hygiene: May shower with wound dressing protected with water repellent cover or cast protector. - DO NOT GET COMPRESSION WRAP WET Anesthetic (Use 'Patient Medications' Section for Anesthetic Order Entry): Lidocaine applied to wound bed Edema Control - Lymphedema / Segmental Compressive Device / Other: Optional: One layer of unna paste to top of compression wrap (to act as an anchor). WOUND #1: - Lower Leg Wound Laterality: Left, Midline, Anterior Cleanser: Soap and Water 2 x Per Week/30 Days Discharge Instructions: Gently cleanse wound with antibacterial soap, rinse and pat dry prior to dressing wounds Primary Dressing: Hydrofera Blue Ready Transfer Foam, 2.5x2.5 (in/in) 2 x Per Week/30  Days Discharge Instructions: Apply Hydrofera Blue Ready to wound bed as directed-cut hydrofera big enough to cover wound bed AND catch drainage. IF USING HYDROFERA CLASSIC, PLASTIC SIDE DOES NOT TOUCH WOUND BED. Please call office for clarification if needed. Secondary Dressing: ABD Pad 5x9 (in/in) 2 x Per Week/30 Days Discharge Instructions: Cover with ABD pad Compression Wrap: Profore Lite LF 3 Multilayer Compression Bandaging System 2 x Per Week/30 Days Discharge Instructions: Apply 3 multi-layer wrap as prescribed. 1. Currently I Minna recommend that we continue with Hydrofera Blue. I did make note of the fact that the patient needed to have the appropriate Hydrofera Blue application applied. It does not need to  be plastic side section the tissue but rather the foam portion touch in the tissue. Again this is because it does not drain through a piece also needs to be cut larger in order to make sure there is enough room for drainage. 2. With regard to the compression I think at this point we can do a 3 layer compression wrap which we do much better as far as helping with edema control. We will see patient back for reevaluation in 1 week here in the clinic. If anything worsens or changes patient will contact our office for additional recommendations. Electronic Signature(s) Signed: 10/11/2020 5:09:37 PM By: Lenda Kelp PA-C Entered By: Lenda Kelp on 10/11/2020 17:09:37 Pemble, [name] ([id number]) -------------------------------------------------------------------------------- SuperBill Details Patient Name: Kinchen, Roshelle R. Date of Service: 10/11/2020 Medical Record Number: [id number] Patient Account Number: [id number] Date of Birth/Sex: [birth year] (55 y.o. F) Treating RN: Rogers Blocker Primary Care Provider: SYSTEM, PCP Other Clinician: Referring Provider: Willy Eddy Treating Provider/Extender: [name] in Treatment: 3 Diagnosis Coding ICD-10 Codes Code  Description [phone] Laceration without foreign body, left lower leg, initial encounter L97.822 Non-pressure chronic ulcer of other part of left lower leg with fat layer exposed E11.622 Type 2 diabetes mellitus with other skin ulcer F01.50 Vascular dementia without behavioral disturbance Facility Procedures CPT4 Code: [id number] Description: 17250 - CHEM CAUT GRANULATION TISS Modifier: Quantity: 1 CPT4 Code: Description: ICD-10 Diagnosis Description L97.822 Non-pressure chronic ulcer of other part of left lower leg with fat layer ex Modifier: posed Quantity: CPT4 Code: [id number] Description: (Facility Use Only) 29581LT - APPLY MULTLAY COMPRS LWR LT LEG Modifier: Quantity: 1 Physician Procedures CPT4 Code: [id number] Description: 17250 - WC PHYS CHEM CAUT GRAN TISSUE Modifier: Quantity: 1 CPT4 Code: Description: ICD-10 Diagnosis Description L97.822 Non-pressure chronic ulcer of other part of left lower leg with fat layer Modifier: exposed Quantity: Electronic Signature(s) Signed: 10/11/2020 5:10:02 PM By: Lenda Kelp PA-C Previous Signature: 10/11/2020 4:24:40 PM Version By: Rogers Blocker RN Entered By: Lenda Kelp on 10/11/2020 17:10:02

## 2020-10-11 NOTE — Progress Notes (Signed)
MEEYA, GOLDIN ([id number]) Visit Report for 10/11/2020 Arrival Information Details Patient Name: [name] [name], [name] [name]. Date of Service: 10/11/2020 10:45 AM Medical Record Number: [id number] Patient Account Number: [id number] Date of Birth/Sex: 11-23-1965 (55 y.o. F) Treating RN: Rogers Blocker Primary Care Heitor Steinhoff: SYSTEM, PCP Other Clinician: Referring Thekla Colborn: Willy Eddy Treating Erandy Mceachern/Extender: [name] in Treatment: 3 Visit Information History Since Last Visit Has Dressing in Place as Prescribed: Yes Patient Arrived: Wheel Chair Pain Present Now: Yes Arrival Time: 11:04 Accompanied By: caregiver Transfer Assistance: Manual Patient Identification Verified: Yes Secondary Verification Process Completed: Yes Patient Has Alerts: Yes Patient Alerts: Type II Diabetic Electronic Signature(s) Signed: 10/11/2020 4:26:23 PM By: Rogers Blocker RN Entered By: Rogers Blocker on 10/11/2020 11:04:58 Ilg, [name] ([id number]) -------------------------------------------------------------------------------- Clinic Level of Care Assessment Details Patient Name: Gladman, Alondria R. Date of Service: 10/11/2020 10:45 AM Medical Record Number: [id number] Patient Account Number: [id number] Date of Birth/Sex: November 01, 1965 (55 y.o. F) Treating RN: Rogers Blocker Primary Care Ether Wolters: SYSTEM, PCP Other Clinician: Referring Cherylene Ferrufino: Willy Eddy Treating Damyiah Moxley/Extender: [name] in Treatment: 3 Clinic Level of Care Assessment Items TOOL 1 Quantity Score []  - Use when EandM and Procedure is performed on INITIAL visit 0 ASSESSMENTS - Nursing Assessment / Reassessment []  - General Physical Exam (combine w/ comprehensive assessment (listed just below) when performed on new 0 pt. evals) []  - 0 Comprehensive Assessment (HX, ROS, Risk Assessments, Wounds Hx, etc.) ASSESSMENTS - Wound and Skin Assessment / Reassessment []  - Dermatologic / Skin Assessment (not  related to wound area) 0 ASSESSMENTS - Ostomy and/or Continence Assessment and Care []  - Incontinence Assessment and Management 0 []  - 0 Ostomy Care Assessment and Management (repouching, etc.) PROCESS - Coordination of Care []  - Simple Patient / Family Education for ongoing care 0 []  - 0 Complex (extensive) Patient / Family Education for ongoing care []  - 0 Staff obtains , Records, Test Results / Process Orders []  - 0 Staff telephones HHA, Nursing Homes / Clarify orders / etc []  - 0 Routine Transfer to another Facility (non-emergent condition) []  - 0 Routine Hospital Admission (non-emergent condition) []  - 0 New Admissions / / Ordering NPWT, Apligraf, etc. []  - 0 Emergency Hospital Admission (emergent condition) PROCESS - Special Needs []  - Pediatric / Minor Patient Management 0 []  - 0 Isolation Patient Management []  - 0 Hearing / Language / Visual special needs []  - 0 Assessment of Community assistance (transportation, D/C planning, etc.) []  - 0 Additional assistance / Altered mentation []  - 0 Support Surface(s) Assessment (bed, cushion, seat, etc.) INTERVENTIONS - Miscellaneous []  - External ear exam 0 []  - 0 Patient Transfer (multiple staff / / Similar devices) []  - 0 Simple Staple / Suture removal (25 or less) []  - 0 Complex Staple / Suture removal (26 or more) []  - 0 Hypo/Hyperglycemic Management (do not check if billed separately) []  - 0 Ankle / Brachial Index (ABI) - do not check if billed separately Has the patient been seen at the hospital within the last three years: Yes Total Score: 0 Level Of Care: ____ ( ) Electronic Signature(s) Signed: 10/11/2020 4:26:23 PM By: RN Entered By: on 10/11/2020 11:26:19 Parlato, Chiropractor ( ) -------------------------------------------------------------------------------- Compression Therapy Details Patient  Name: Nan, Hagan R. Date of Service: 10/11/2020 10:45 AM Medical Record Number: Patient Account Number: Date of Birth/Sex: Apr 08, 1965 (55 y.o. F) Treating RN: Primary Care Dorthy Hustead: SYSTEM, PCP  Other Clinician: Referring Vamsi Apfel: Willy EddyOBINSON, PATRICK Treating Aubreigh Fuerte/Extender: Rowan BlaseStone, Hoyt Weeks in Treatment: 3 Compression Therapy Performed for Wound Assessment: Wound #1 Left,Midline,Anterior Lower Leg Performed By: Clinician Rogers BlockerSanchez, Kenia, RN Compression Type: Three Layer Pre Treatment ABI: 1.2 Post Procedure Diagnosis Same as Pre-procedure Electronic Signature(s) Signed: 10/11/2020 4:26:23 PM By: Rogers BlockerSanchez , Kenia RN Entered By: Rogers BlockerSanchez , Kenia on 10/11/2020 11:21:51 Doom, Christie BeckersJENNIFER R. ([id number]) -------------------------------------------------------------------------------- Encounter Discharge Information Details Patient Name: Navarra, Joscelin R. Date of Service: 10/11/2020 10:45 AM Medical Record Number: [id number] Patient Account Number: [id number] Date of Birth/Sex: [birth year] (55 y.o. F) Treating RN: Rogers BlockerSanchez, Kenia Primary Care Bharath Bernstein: SYSTEM, PCP Other Clinician: Referring Stevin Bielinski: Willy EddyOBINSON, PATRICK Treating Blessyn Sommerville/Extender: Rowan BlaseStone, Hoyt Weeks in Treatment: 3 Encounter Discharge Information Items Discharge Condition: Stable Ambulatory Status: Wheelchair Discharge Destination: Other (Note Required) Orders Sent: Yes Transportation: Private Auto Accompanied By: caregiver Schedule Follow-up Appointment: Yes Clinical Summary of Care: Electronic Signature(s) Signed: 10/11/2020 4:25:24 PM By: Rogers BlockerSanchez , Kenia RN Entered By: Rogers BlockerSanchez , Kenia on 10/11/2020 16:25:24 Folker, Christie BeckersJENNIFER R. ([id number]) -------------------------------------------------------------------------------- Lower Extremity Assessment Details Patient Name: Roundy, [place] R. Date of Service: 10/11/2020 10:45 AM Medical Record Number: [id number] Patient Account  Number: [id number] Date of Birth/Sex: [birth year] (55 y.o. F) Treating RN: Rogers BlockerSanchez, Kenia Primary Care Treva Huyett: SYSTEM, PCP Other Clinician: Referring Audrick Lamoureaux: Willy EddyOBINSON, PATRICK Treating Danni Leabo/Extender: Rowan BlaseStone, Hoyt Weeks in Treatment: 3 Edema Assessment Assessed: [Left: Yes] [Right: No] Edema: [Left: Ye] [Right: s] Calf Left: Right: Point of Measurement: 29 cm From Medial Instep 36 cm Ankle Left: Right: Point of Measurement: 11 cm From Medial Instep 24.5 cm Vascular Assessment Pulses: Dorsalis Pedis Palpable: [Left:Yes] Electronic Signature(s) Signed: 10/11/2020 4:26:23 PM By: Rogers BlockerSanchez , Kenia RN Entered By: Rogers BlockerSanchez , Kenia on 10/11/2020 11:13:27 Glahn, Christie BeckersJENNIFER R. ([id number]) -------------------------------------------------------------------------------- Multi Wound Chart Details Patient Name: Lansdowne, Adi R. Date of Service: 10/11/2020 10:45 AM Medical Record Number: [id number] Patient Account Number: [id number] Date of Birth/Sex: [birth year] (55 y.o. F) Treating RN: Rogers BlockerSanchez, Kenia Primary Care Raelle Chambers: SYSTEM, PCP Other Clinician: Referring Sequoia Mincey: Willy EddyOBINSON, PATRICK Treating Pallas Wahlert/Extender: Rowan BlaseStone, Hoyt Weeks in Treatment: 3 Vital Signs Height(in): 68 Pulse(bpm): 73 Weight(lbs): 216 Blood Pressure(mmHg): 145/75 Body Mass Index(BMI): 33 Temperature(F): 98.6 Respiratory Rate(breaths/min): 18 Photos: [N/A:N/A] Wound Location: Left, Midline, Anterior Lower Leg N/A N/A Wounding Event: Trauma N/A N/A Primary Etiology: Trauma, Other N/A N/A Comorbid History: Anemia, Asthma, Chronic N/A N/A Obstructive Pulmonary Disease (COPD), Type II Diabetes, Osteoarthritis Date Acquired: 08/30/2020 N/A N/A Weeks of Treatment: 3 N/A N/A Wound Status: Open N/A N/A Measurements L x W x D (cm) 4.5x2.8x0.2 N/A N/A Area (cm) : 9.896 N/A N/A Volume (cm) : 1.979 N/A N/A % Reduction in Area: 39.70% N/A N/A % Reduction in Volume: 59.80% N/A N/A Classification: Full  Thickness Without Exposed N/A N/A Support Structures Exudate Amount: Medium N/A N/A Exudate Type: Serosanguineous N/A N/A Exudate Color: red, brown N/A N/A Wound Margin: Flat and Intact N/A N/A Granulation Amount: Large (67-100%) N/A N/A Granulation Quality: Red, Hyper-granulation N/A N/A Necrotic Amount: Small (1-33%) N/A N/A Exposed Structures: Fat Layer (Subcutaneous Tissue): N/A N/A Yes Fascia: No Tendon: No Muscle: No Joint: No Bone: No Epithelialization: Small (1-33%) N/A N/A Treatment Notes Electronic Signature(s) Signed: 10/11/2020 4:26:23 PM By: Rogers BlockerSanchez , Kenia RN Entered By: Rogers BlockerSanchez , Kenia on 10/11/2020 11:21:08 Leh, Christie BeckersJENNIFER R. ([id number]) -------------------------------------------------------------------------------- Multi-Disciplinary Care Plan Details Patient Name: Vanroekel, Yalda R. Date of Service: 10/11/2020 10:45 AM Medical Record Number: [id number] Patient Account Number: [id number] Date of Birth/Sex: [birth year] (55 y.o. F) Treating RN: Rogers BlockerSanchez, Kenia Primary Care  Jayland Null: SYSTEM, PCP Other Clinician: Referring Jareli Highland: Willy Eddy Treating Jonel Weldon/Extender: [name] in Treatment: 3 Active Inactive Abuse / Safety / Falls / Self Care Management Nursing Diagnoses: History of Falls Potential for falls Goals: Patient will not experience any injury related to falls Date Initiated: 09/20/2020 Target Resolution Date: 10/20/2020 Goal Status: Active Interventions: Podiatry chair, stretcher in low position and side rails up as needed Provide education on safe transfers Notes: Medication Nursing Diagnoses: Knowledge deficit related to medication safety: actual or potential Goals: Patient/caregiver will demonstrate understanding of all current medications Date Initiated: 09/20/2020 Target Resolution Date: 10/20/2020 Goal Status: Active Patient/caregiver will demonstrate understanding of new oral/IV medications prescribed at the [facility]  (topical prescriptions are covered under the skin breakdown problem) Date Initiated: 09/20/2020 Target Resolution Date: 10/20/2020 Goal Status: Active Interventions: Assess for medication contraindications each visit where new medications are prescribed Patient/Caregiver given reconciled medication list upon admission, changes in medications and discharge from the Wound Center Notes: Pain, Acute or Chronic Nursing Diagnoses: Pain Management - Non-cyclic Chronic Pain Pain, acute or chronic: actual or potential Potential alteration in comfort, pain Goals: Patient will verbalize adequate pain control and receive pain control interventions during procedures as needed Date Initiated: 09/20/2020 Target Resolution Date: 10/20/2020 Goal Status: Active Interventions: Assess comfort goal upon admission Treatment Activities: Administer pain control measures as ordered : 09/20/2020 Mckamie, Sophiamarie R. ([id number]) Notes: Soft Tissue Infection Nursing Diagnoses: Impaired tissue integrity Knowledge deficit related to disease process and management Knowledge deficit related to home infection control: handwashing, handling of soiled dressings, supply storage Goals: Patient will remain free of wound infection Date Initiated: 09/20/2020 Target Resolution Date: 10/20/2020 Goal Status: Active Patient/caregiver will verbalize understanding of or measures to prevent infection and contamination in the home setting Date Initiated: 09/20/2020 Target Resolution Date: 10/20/2020 Goal Status: Active Signs and symptoms of infection will be recognized early to allow for prompt treatment Date Initiated: 09/20/2020 Target Resolution Date: 10/20/2020 Goal Status: Active Interventions: Assess signs and symptoms of infection every visit Provide education on infection Notes: Venous Leg Ulcer Nursing Diagnoses: Actual venous Insuffiency (use after diagnosis is confirmed) Knowledge deficit related to disease process and  management Potential for venous Insuffiency (use before diagnosis confirmed) Goals: Patient will maintain optimal edema control Date Initiated: 09/20/2020 Target Resolution Date: 10/20/2020 Goal Status: Active Interventions: Provide education on venous insufficiency Notes: Wound/Skin Impairment Nursing Diagnoses: Impaired tissue integrity Goals: Patient/caregiver will verbalize understanding of skin care regimen Date Initiated: 09/20/2020 Target Resolution Date: 10/20/2020 Goal Status: Active Ulcer/skin breakdown will have a volume reduction of 30% by week 4 Date Initiated: 09/20/2020 Target Resolution Date: 10/20/2020 Goal Status: Active Interventions: Assess patient/caregiver ability to obtain necessary supplies Assess ulceration(s) every visit Treatment Activities: Skin care regimen initiated : 09/20/2020 Topical wound management initiated : 09/20/2020 Notes: BURNETT, LIEBER ([id number]) Electronic Signature(s) Signed: 10/11/2020 4:26:23 PM By: Rogers Blocker RN Entered By: Rogers Blocker on 10/11/2020 11:19:11 Spark, [name] ([id number]) -------------------------------------------------------------------------------- Pain Assessment Details Patient Name: Mccosh, Andelyn R. Date of Service: 10/11/2020 10:45 AM Medical Record Number: [id number] Patient Account Number: [id number] Date of Birth/Sex: 1965-05-07 (55 y.o. F) Treating RN: Rogers Blocker Primary Care Tavin Vernet: SYSTEM, PCP Other Clinician: Referring Raejean Swinford: Willy Eddy Treating Delila Kuklinski/Extender: [name] in Treatment: 3 Active Problems Location of Pain Severity and Description of Pain Patient Has Paino Yes Site Locations Pain Location: Pain in Ulcers Duration of the Pain. Constant / Intermittento Constant Rate the pain. Current Pain Level: 10 Character of Pain Describe the  Pain: Shooting Pain Management and Medication Current Pain Management: Electronic Signature(s) Signed: 10/11/2020  4:26:23 PM By: Rogers Blocker RN Entered By: Rogers Blocker on 10/11/2020 11:05:42 [place], [name] ([id number]) -------------------------------------------------------------------------------- Patient/Caregiver Education Details Patient Name: Tuel, Victorino Dike R. Date of Service: 10/11/2020 10:45 AM Medical Record Number: [id number] Patient Account Number: [id number] Date of Birth/Gender: Jun 23, 1965 (55 y.o. F) Treating RN: Rogers Blocker Primary Care Physician: SYSTEM, PCP Other Clinician: Referring Physician: Willy Eddy Treating Physician/Extender: [name] in Treatment: 3 Education Assessment Education Provided To: Patient Education Topics Provided Wound/Skin Impairment: Methods: Explain/Verbal Responses: State content correctly Electronic Signature(s) Signed: 10/11/2020 4:26:23 PM By: Rogers Blocker RN Entered By: Rogers Blocker on 10/11/2020 16:24:51 Horkey, [name] ([id number]) -------------------------------------------------------------------------------- Wound Assessment Details Patient Name: Sollenberger, Ruthie R. Date of Service: 10/11/2020 10:45 AM Medical Record Number: [id number] Patient Account Number: [id number] Date of Birth/Sex: Jun 19, 1965 (55 y.o. F) Treating RN: Rogers Blocker Primary Care Martrell Eguia: SYSTEM, PCP Other Clinician: Referring Ashtynn Berke: Willy Eddy Treating Zack Crager/Extender: [name] in Treatment: 3 Wound Status Wound Number: 1 Primary Trauma, Other Etiology: Wound Location: Left, Midline, Anterior Lower Leg Wound Open Wounding Event: Trauma Status: Date Acquired: 08/30/2020 Comorbid Anemia, Asthma, Chronic Obstructive Pulmonary Disease Weeks Of Treatment: 3 History: (COPD), Type II Diabetes, Osteoarthritis Clustered Wound: No Photos Wound Measurements Length: (cm) 4.5 Width: (cm) 2.8 Depth: (cm) 0.2 Area: (cm) 9.896 Volume: (cm) 1.979 % Reduction in Area: 39.7% % Reduction in Volume:  59.8% Epithelialization: Small (1-33%) Tunneling: No Undermining: No Wound Description Classification: Full Thickness Without Exposed Support Structu Wound Margin: Flat and Intact Exudate Amount: Medium Exudate Type: Serosanguineous Exudate Color: red, brown res Foul Odor After Cleansing: No Slough/Fibrino Yes Wound Bed Granulation Amount: Large (67-100%) Exposed Structure Granulation Quality: Red, Hyper-granulation Fascia Exposed: No Necrotic Amount: Small (1-33%) Fat Layer (Subcutaneous Tissue) Exposed: Yes Necrotic Quality: Adherent Slough Tendon Exposed: No Muscle Exposed: No Joint Exposed: No Bone Exposed: No Treatment Notes Wound #1 (Lower Leg) Wound Laterality: Left, Midline, Anterior Cleanser Soap and Water Discharge Instruction: Gently cleanse wound with antibacterial soap, rinse and pat dry prior to dressing wounds Peri-Wound Care Linville, Aimie R. ([id number]) Topical Primary Dressing Hydrofera Blue Ready Transfer Foam, 2.5x2.5 (in/in) Discharge Instruction: Apply Hydrofera Blue Ready to wound bed as directed-cut hydrofera big enough to cover wound bed AND catch drainage. IF USING HYDROFERA CLASSIC, PLASTIC SIDE DOES NOT TOUCH WOUND BED. Please call office for clarification if needed. Secondary Dressing ABD Pad 5x9 (in/in) Discharge Instruction: Cover with ABD pad Secured With Compression Wrap Profore Lite LF 3 Multilayer Compression Bandaging System Discharge Instruction: Apply 3 multi-layer wrap as prescribed. Compression Stockings Add-Ons Electronic Signature(s) Signed: 10/11/2020 4:26:23 PM By: Rogers Blocker RN Entered By: Rogers Blocker on 10/11/2020 11:11:10 Dobos, [name] ([id number]) -------------------------------------------------------------------------------- Vitals Details Patient Name: Russ, Ikeisha R. Date of Service: 10/11/2020 10:45 AM Medical Record Number: [id number] Patient Account Number: [id number] Date of Birth/Sex:  Nov 18, 1965 (55 y.o. F) Treating RN: Rogers Blocker Primary Care Tsutomu Barfoot: SYSTEM, PCP Other Clinician: Referring Shalynn Jorstad: Willy Eddy Treating Makayle Krahn/Extender: [name] in Treatment: 3 Vital Signs Time Taken: 11:05 Temperature (F): 98.6 Height (in): 68 Pulse (bpm): 73 Weight (lbs): 216 Respiratory Rate (breaths/min): 18 Body Mass Index (BMI): 32.8 Blood Pressure (mmHg): 145/75 Reference Range: 80 - 120 mg / dl Electronic Signature(s) Signed: 10/11/2020 4:26:23 PM By: Rogers Blocker RN Entered By: Rogers Blocker on 10/11/2020 11:05:18

## 2020-10-19 ENCOUNTER — Other Ambulatory Visit: Payer: Self-pay

## 2020-10-19 ENCOUNTER — Encounter: Payer: Medicaid Other | Admitting: Physician Assistant

## 2020-10-19 DIAGNOSIS — E11622 Type 2 diabetes mellitus with other skin ulcer: Secondary | ICD-10-CM | POA: Diagnosis not present

## 2020-10-19 NOTE — Progress Notes (Addendum)
HILARY, MILKS ([id number]) Visit Report for 10/19/2020 Chief Complaint Document Details Patient Name: [name] [name], [name] [name]. Date of Service: 10/19/2020 10:00 AM Medical Record Number: [id number] Patient Account Number: [id number] Date of Birth/Sex: 02/21/1965 (55 y.o. F) Treating RN: Rogers Blocker Primary Care Provider: SYSTEM, PCP Other Clinician: Referring Provider: Willy Eddy Treating Provider/Extender: [name] in Treatment: 4 Information Obtained from: Patient Chief Complaint Left leg traumatic ulcer Electronic Signature(s) Signed: 10/19/2020 10:18:44 AM By: Lenda Kelp PA-C Entered By: Lenda Kelp on 10/19/2020 10:18:44 [name] [name], [name] [name] ([id number]) -------------------------------------------------------------------------------- Debridement Details Patient Name: [name] [name], [name] Date of Service: 10/19/2020 10:00 AM Medical Record Number: [id number] Patient Account Number: [id number] Date of Birth/Sex: 01/25/1966 (55 y.o. F) Treating RN: Rogers Blocker Primary Care Provider: SYSTEM, PCP Other Clinician: Referring Provider: Willy Eddy Treating Provider/Extender: [name] in Treatment: 4 Debridement Performed for Wound #1 Left,Midline,Anterior Lower Leg Assessment: Performed By: Physician Nelida Meuse., PA-C Debridement Type: Debridement Level of Consciousness (Pre- Awake and Alert procedure): Pre-procedure Verification/Time Out Yes - 10:56 Taken: Start Time: 10:56 Total Area Debrided (L x W): 3.6 (cm) x 2.1 (cm) = 7.56 (cm) Tissue and other material Viable, Subcutaneous, Hyper-granulation debrided: Level: Skin/Subcutaneous Tissue Debridement Description: Excisional Instrument: Curette Bleeding: Minimum Hemostasis Achieved: Pressure Response to Treatment: Procedure was tolerated well Level of Consciousness (Post- Awake and Alert procedure): Post Debridement Measurements of Total Wound Length: (cm) 3.6 Width: (cm)  2.1 Depth: (cm) 0.2 Volume: (cm) 1.188 Character of Wound/Ulcer Post Debridement: Stable Post Procedure Diagnosis Same as Pre-procedure Electronic Signature(s) Signed: 10/19/2020 4:37:36 PM By: Rogers Blocker RN Signed: 10/19/2020 5:58:25 PM By: Lenda Kelp PA-C Entered By: Rogers Blocker on 10/19/2020 10:57:59 [name] [name], [name] [name] ([id number]) -------------------------------------------------------------------------------- HPI Details Patient Name: [name] [name], [name] Date of Service: 10/19/2020 10:00 AM Medical Record Number: [id number] Patient Account Number: [id number] Date of Birth/Sex: Jul 13, 1965 (55 y.o. F) Treating RN: Rogers Blocker Primary Care Provider: SYSTEM, PCP Other Clinician: Referring Provider: Willy Eddy Treating Provider/Extender: [name] in Treatment: 4 History of Present Illness HPI Description: 09/20/2020 upon evaluation today patient presents for initial inspection here in our clinic concerning issues that she has been having with a trauma wound with a skin tear on the anterior portion of her left leg after getting in a encounter with another resident at the facility at the [facility]. Subsequently she is having quite a bit of pain according to what she is telling me today. There does not appear to be any signs of active infection which is great news. With that being said I do see some signs of hypergranulation so I believe there is a little bit too much moisture going on I am not sure what dressing wound they are using as all she had on was basically just a protective dressing today nothing really more. Either way I think that she would actually do well with [facility] which is probably the best option currently based on what I am seeing. The surface otherwise looks good just a little bit hypergranular. She does have a history of seemingly dementia along with some other psychiatric conditions in general. She does have diabetes mellitus  type 2 as well. 10/11/2020 upon evaluation today patient appears to be doing decently well in regard to her wound. Fortunately there does not appear to be any signs of active infection which is great news. The biggest issue is that the [facility] was put on backwards. Unfortunately this was not the ready transfer  and therefore has a back which does not allow any absorption this did not do too well for the patient to be perfectly honest. Nonetheless the wound does not look worse but there is still some hypergranulation I think that silver nitrate will be helpful today. 10/19/2020 upon evaluation today patient appears to be doing well with regard to her wound. Overall I feel like this is significantly improved which is great news. Nonetheless I feel like there is still some hypergranulation that I would like to try to trim down a little bit today with debridement and I discussed that with the patient she is in agreement with that. Electronic Signature(s) Signed: 10/19/2020 10:59:53 AM By: Lenda Kelp PA-C Entered By: Lenda Kelp on 10/19/2020 10:59:52 [name] [name], [name] [name] ([id number]) -------------------------------------------------------------------------------- Physical Exam Details Patient Name: [name] [name], [name] Date of Service: 10/19/2020 10:00 AM Medical Record Number: [id number] Patient Account Number: [id number] Date of Birth/Sex: [birth year] (55 y.o. F) Treating RN: Rogers Blocker Primary Care Provider: SYSTEM, PCP Other Clinician: Referring Provider: Willy Eddy Treating Provider/Extender: [name] in Treatment: 4 Constitutional Well-nourished and well-hydrated in no acute distress. Respiratory normal breathing without difficulty. Psychiatric this patient is able to make decisions and demonstrates good insight into disease process. Alert and Oriented x 3. pleasant and cooperative. Notes Upon inspection patient's wound bed did show some hypergranulation I  did actually remove quite a bit of this with sharp debridement today and she tolerated that without complication. Post debridement wound bed appears to be doing much better which is good there was minimal bleeding which is good that is also better than last week as well. Overall I think the [facility] is doing a great job. Electronic Signature(s) Signed: 10/19/2020 11:00:14 AM By: Lenda Kelp PA-C Entered By: Lenda Kelp on 10/19/2020 11:00:14 [name] [name], [name] [name] ([id number]) -------------------------------------------------------------------------------- Physician Orders Details Patient Name: [name] [name], [name] Date of Service: 10/19/2020 10:00 AM Medical Record Number: [id number] Patient Account Number: [id number] Date of Birth/Sex: 11/26/1965 (55 y.o. F) Treating RN: Rogers Blocker Primary Care Provider: SYSTEM, PCP Other Clinician: Referring Provider: Willy Eddy Treating Provider/Extender: [name] in Treatment: 4 Verbal / Phone Orders: No Diagnosis Coding ICD-10 Coding Code Description 9562659518 Laceration without foreign body, left lower leg, initial encounter L97.822 Non-pressure chronic ulcer of other part of left lower leg with fat layer exposed E11.622 Type 2 diabetes mellitus with other skin ulcer F01.50 Vascular dementia without behavioral disturbance Follow-up Appointments o Return Appointment in 2 weeks. - [facility]-PLEASE SCHEDULE ACCORDINGLY AS PATIENT WILL NOT BE SEEN FOR 2 WEEKS o Nurse Visit as needed - call to set up if needed Home Health o Home Health Company: - [facility] o [facility] for wound care. May utilize formulary equivalent dressing for wound treatment orders unless otherwise specified. Home Health Nurse may visit PRN to address patientos wound care needs. o Scheduled days for dressing changes to be completed; exception, patient has scheduled wound care visit that day. o **Please direct any NON-WOUND  related issues/requests for orders to patient's Primary Care Physician. **If current dressing causes regression in wound condition, may D/C ordered dressing product/s and apply Normal Saline Moist Dressing daily until next Wound Healing Center or Other MD appointment. **Notify Wound Healing Center of regression in wound condition at [phone]. Bathing/ Shower/ Hygiene o May shower with wound dressing protected with water repellent cover or cast protector. - DO NOT GET COMPRESSION WRAP WET Anesthetic (Use 'Patient Medications' Section for Anesthetic Order Entry) o  Lidocaine applied to wound bed Edema Control - Lymphedema / Segmental Compressive Device / Other o Optional: One layer of unna paste to top of compression wrap (to act as an anchor). Wound Treatment Wound #1 - Lower Leg Wound Laterality: Left, Midline, Anterior Cleanser: Soap and Water 2 x Per Week/30 Days Discharge Instructions: Gently cleanse wound with antibacterial soap, rinse and pat dry prior to dressing wounds Primary Dressing: Hydrofera Blue Ready Transfer Foam, 2.5x2.5 (in/in) 2 x Per Week/30 Days Discharge Instructions: Apply Hydrofera Blue Ready to wound bed as directed-cut hydrofera big enough to cover wound bed AND catch drainage. IF USING HYDROFERA CLASSIC, PLASTIC SIDE DOES NOT TOUCH WOUND BED. Please call office for clarification if needed. Secondary Dressing: ABD Pad 5x9 (in/in) 2 x Per Week/30 Days Discharge Instructions: Cover with ABD pad Compression Wrap: Profore Lite LF 3 Multilayer Compression Bandaging System 2 x Per Week/30 Days Discharge Instructions: Apply 3 multi-layer wrap as prescribed. Electronic Signature(s) Signed: 10/19/2020 4:37:36 PM By: Rogers Blocker RN Signed: 10/19/2020 5:58:25 PM By: Lenda Kelp PA-C Entered By: Rogers Blocker on 10/19/2020 10:59:17 Speckman, [name] [name] ([id number]) [name] [name], [name] [name]  ([id number]) -------------------------------------------------------------------------------- Problem List Details Patient Name: [name] [name], [name] Date of Service: 10/19/2020 10:00 AM Medical Record Number: [id number] Patient Account Number: [id number] Date of Birth/Sex: [birth year] (55 y.o. F) Treating RN: Rogers Blocker Primary Care Provider: SYSTEM, PCP Other Clinician: Referring Provider: Willy Eddy Treating Provider/Extender: [name] in Treatment: 4 Active Problems ICD-10 Encounter Code Description Active Date MDM Diagnosis S81.812A Laceration without foreign body, left lower leg, initial encounter 09/20/2020 No Yes L97.822 Non-pressure chronic ulcer of other part of left lower leg with fat layer 09/20/2020 No Yes exposed E11.622 Type 2 diabetes mellitus with other skin ulcer 09/20/2020 No Yes F01.50 Vascular dementia without behavioral disturbance 09/20/2020 No Yes Inactive Problems Resolved Problems Electronic Signature(s) Signed: 10/19/2020 10:18:38 AM By: Lenda Kelp PA-C Entered By: Lenda Kelp on 10/19/2020 10:18:37 [name] [name], [name] [name] ([id number]) -------------------------------------------------------------------------------- Progress Note Details Patient Name: [name] [name], [name] Date of Service: 10/19/2020 10:00 AM Medical Record Number: [id number] Patient Account Number: [id number] Date of Birth/Sex: 09-17-1965 (55 y.o. F) Treating RN: Rogers Blocker Primary Care Provider: SYSTEM, PCP Other Clinician: Referring Provider: Willy Eddy Treating Provider/Extender: [name] in Treatment: 4 Subjective Chief Complaint Information obtained from Patient Left leg traumatic ulcer History of Present Illness (HPI) 09/20/2020 upon evaluation today patient presents for initial inspection here in our clinic concerning issues that she has been having with a trauma wound with a skin tear on the anterior portion of her left leg after getting in a  encounter with another resident at the facility at the [facility]. Subsequently she is having quite a bit of pain according to what she is telling me today. There does not appear to be any signs of active infection which is great news. With that being said I do see some signs of hypergranulation so I believe there is a little bit too much moisture going on I am not sure what dressing wound they are using as all she had on was basically just a protective dressing today nothing really more. Either way I think that she would actually do well with [facility] which is probably the best option currently based on what I am seeing. The surface otherwise looks good just a little bit hypergranular. She does have a history of seemingly dementia along with some other psychiatric conditions in general. She does have diabetes  mellitus type 2 as well. 10/11/2020 upon evaluation today patient appears to be doing decently well in regard to her wound. Fortunately there does not appear to be any signs of active infection which is great news. The biggest issue is that the [facility] was put on backwards. Unfortunately this was not the ready transfer and therefore has a back which does not allow any absorption this did not do too well for the patient to be perfectly honest. Nonetheless the wound does not look worse but there is still some hypergranulation I think that silver nitrate will be helpful today. 10/19/2020 upon evaluation today patient appears to be doing well with regard to her wound. Overall I feel like this is significantly improved which is great news. Nonetheless I feel like there is still some hypergranulation that I would like to try to trim down a little bit today with debridement and I discussed that with the patient she is in agreement with that. Objective Constitutional Well-nourished and well-hydrated in no acute distress. Vitals Time Taken: 10:33 AM, Height: 68 in, Weight: 216 lbs,  BMI: 32.8, Temperature: 98.6 F, Pulse: 76 bpm, Respiratory Rate: 18 breaths/min, Blood Pressure: 137/80 mmHg. Respiratory normal breathing without difficulty. Psychiatric this patient is able to make decisions and demonstrates good insight into disease process. Alert and Oriented x 3. pleasant and cooperative. General Notes: Upon inspection patient's wound bed did show some hypergranulation I did actually remove quite a bit of this with sharp debridement today and she tolerated that without complication. Post debridement wound bed appears to be doing much better which is good there was minimal bleeding which is good that is also better than last week as well. Overall I think the [facility] is doing a great job. Integumentary (Hair, Skin) Wound #1 status is Open. Original cause of wound was Trauma. The date acquired was: 08/30/2020. The wound has been in treatment 4 weeks. The wound is located on the Left,Midline,Anterior Lower Leg. The wound measures 3.6cm length x 2.1cm width x 0.2cm depth; 5.938cm^2 area and 1.188cm^3 volume. There is Fat Layer (Subcutaneous Tissue) exposed. There is no tunneling or undermining noted. There is a medium amount of serosanguineous drainage noted. The wound margin is flat and intact. There is large (67-100%) red, friable, hyper - granulation within the wound bed. There is no necrotic tissue within the wound bed. Klontz, CHRISTABELLE HANZLIK ([id number]) Assessment Active Problems ICD-10 Laceration without foreign body, left lower leg, initial encounter Non-pressure chronic ulcer of other part of left lower leg with fat layer exposed Type 2 diabetes mellitus with other skin ulcer Vascular dementia without behavioral disturbance Procedures Wound #1 Pre-procedure diagnosis of Wound #1 is a Trauma, Other located on the Left,Midline,Anterior Lower Leg . There was a Excisional Skin/Subcutaneous Tissue Debridement with a total area of 7.56 sq cm performed by Nelida Meuse., PA-C. With the following instrument(s): Curette to remove Viable tissue/material. Material removed includes Subcutaneous Tissue and Hyper-granulation and. A time out was conducted at 10:56, prior to the start of the procedure. A Minimum amount of bleeding was controlled with Pressure. The procedure was tolerated well. Post Debridement Measurements: 3.6cm length x 2.1cm width x 0.2cm depth; 1.188cm^3 volume. Character of Wound/Ulcer Post Debridement is stable. Post procedure Diagnosis Wound #1: Same as Pre-Procedure Pre-procedure diagnosis of Wound #1 is a Trauma, Other located on the Left,Midline,Anterior Lower Leg . There was a Double Layer Compression Therapy Procedure with a pre-treatment ABI of 1.2 by Rogers Blocker, RN. Post procedure Diagnosis  Wound #1: Same as Pre-Procedure Plan Follow-up Appointments: Return Appointment in 2 weeks. - [facility]-PLEASE SCHEDULE ACCORDINGLY AS PATIENT WILL NOT BE SEEN FOR 2 WEEKS Nurse Visit as needed - call to set up if needed Home Health: [facility] May utilize formulary equivalent dressing for wound treatment orders unless otherwise specified. Home Health Nurse may visit PRN to address patient s wound care needs. Scheduled days for dressing changes to be completed; exception, patient has scheduled wound care visit that day. **Please direct any NON-WOUND related issues/requests for orders to patient's Primary Care Physician. **If current dressing causes regression in wound condition, may D/C ordered dressing product/s and apply Normal Saline Moist Dressing daily until next Wound Healing Center or Other MD appointment. **Notify Wound Healing Center of regression in wound condition at [phone]. Bathing/ Shower/ Hygiene: May shower with wound dressing protected with water repellent cover or cast protector. - DO NOT GET COMPRESSION WRAP WET Anesthetic (Use 'Patient Medications' Section  for Anesthetic Order Entry): Lidocaine applied to wound bed Edema Control - Lymphedema / Segmental Compressive Device / Other: Optional: One layer of unna paste to top of compression wrap (to act as an anchor). WOUND #1: - Lower Leg Wound Laterality: Left, Midline, Anterior Cleanser: Soap and Water 2 x Per Week/30 Days Discharge Instructions: Gently cleanse wound with antibacterial soap, rinse and pat dry prior to dressing wounds Primary Dressing: Hydrofera Blue Ready Transfer Foam, 2.5x2.5 (in/in) 2 x Per Week/30 Days Discharge Instructions: Apply Hydrofera Blue Ready to wound bed as directed-cut hydrofera big enough to cover wound bed AND catch drainage. IF USING HYDROFERA CLASSIC, PLASTIC SIDE DOES NOT TOUCH WOUND BED. Please call office for clarification if needed. Secondary Dressing: ABD Pad 5x9 (in/in) 2 x Per Week/30 Days Discharge Instructions: Cover with ABD pad Compression Wrap: Profore Lite LF 3 Multilayer Compression Bandaging System 2 x Per Week/30 Days Discharge Instructions: Apply 3 multi-layer wrap as prescribed. 1. Would recommend that we going continue with wound care measures as before and the patient is in agreement with the plan. This includes the use of the [facility] which I think is doing awesome. 2. We will continue with an ABD pad to cover followed by 3 layer compression wrap. We will see patient back for reevaluation in 2 weeks here in the clinic. If anything worsens or changes patient will contact our office for additional recommendations. Rex, [name] BeckersJENNIFER R. ([id number]) Electronic Signature(s) Signed: 10/19/2020 11:03:14 AM By: Lenda KelpStone III, Emori Mumme PA-C Entered By: Lenda KelpStone III, Lexxi Koslow on 10/19/2020 11:03:13 [place], [name] BeckersJENNIFER R. ([id number]) -------------------------------------------------------------------------------- SuperBill Details Patient Name: Dufner, Ayn R. Date of Service: 10/19/2020 Medical Record Number: [id number] Patient Account Number:  [id number] Date of Birth/Sex: [birth year] (55 y.o. F) Treating RN: Rogers BlockerSanchez, Kenia Primary Care Provider: SYSTEM, PCP Other Clinician: Referring Provider: Willy EddyOBINSON, PATRICK Treating Provider/Extender: Rowan BlaseStone, Ivey Cina Weeks in Treatment: 4 Diagnosis Coding ICD-10 Codes Code Description [id number] Laceration without foreign body, left lower leg, initial encounter L97.822 Non-pressure chronic ulcer of other part of left lower leg with fat layer exposed E11.622 Type 2 diabetes mellitus with other skin ulcer F01.50 Vascular dementia without behavioral disturbance Facility Procedures CPT4 Code: [id number] Description: 11042 - DEB SUBQ TISSUE 20 SQ CM/< Modifier: Quantity: 1 CPT4 Code: Description: ICD-10 Diagnosis Description L97.822 Non-pressure chronic ulcer of other part of left lower leg with fat layer Modifier: exposed Quantity: Physician Procedures CPT4 Code: [id number] Description: 11042 - WC PHYS SUBQ TISS 20 SQ CM Modifier: Quantity: 1  CPT4 Code: Description: ICD-10 Diagnosis Description L97.822 Non-pressure chronic ulcer of other part of left lower leg with fat layer Modifier: exposed Quantity: Electronic Signature(s) Signed: 10/19/2020 11:03:49 AM By: Lenda Kelp PA-C Entered By: Lenda Kelp on 10/19/2020 11:03:49

## 2020-10-19 NOTE — Progress Notes (Addendum)
[name] [name], [name] [name] ([id number]) Visit Report for 10/19/2020 Arrival Information Details Patient Name: [name] [name], [name] [name]. Date of Service: 10/19/2020 10:00 AM Medical Record Number: [id number] Patient Account Number: [id number] Date of Birth/Sex: 1965/04/12 (55 y.o. F) Treating RN: Rogers Blocker Primary Care [name] [name]: SYSTEM, PCP Other Clinician: Referring [name] [name]: Willy Eddy Treating Konnar Ben/Extender: [name] [name] in Treatment: 4 Visit Information History Since Last Visit Has Dressing in Place as Prescribed: No Patient Arrived: Wheel Chair Pain Present Now: Yes Arrival Time: 10:32 Accompanied By: caregiver Transfer Assistance: Manual Patient Identification Verified: Yes Secondary Verification Process Completed: Yes Patient Has Alerts: Yes Patient Alerts: Type II Diabetic Electronic Signature(s) Signed: 10/19/2020 4:37:36 PM By: Rogers Blocker RN Entered By: Rogers Blocker on 10/19/2020 10:33:10 [name] [name], [name] [name] ([id number]) -------------------------------------------------------------------------------- Clinic Level of Care Assessment Details Patient Name: [name], [name] Date of Service: 10/19/2020 10:00 AM Medical Record Number: [id number] Patient Account Number: [id number] Date of Birth/Sex: February 18, 1966 (55 y.o. F) Treating RN: Rogers Blocker Primary Care [name] [name]: SYSTEM, PCP Other Clinician: Referring [name] [name]: Willy Eddy Treating [name] [name]/Extender: [name] [name] in Treatment: 4 Clinic Level of Care Assessment Items TOOL 1 Quantity Score []  - Use when EandM and Procedure is performed on INITIAL visit 0 ASSESSMENTS - Nursing Assessment / Reassessment []  - General Physical Exam (combine w/ comprehensive assessment (listed just below) when performed on new 0 pt. evals) []  - 0 Comprehensive Assessment (HX, ROS, Risk Assessments, Wounds Hx, etc.) ASSESSMENTS - Wound and Skin Assessment / Reassessment []  - Dermatologic / Skin Assessment (not  related to wound area) 0 ASSESSMENTS - Ostomy and/or Continence Assessment and Care []  - Incontinence Assessment and Management 0 []  - 0 Ostomy Care Assessment and Management (repouching, etc.) PROCESS - Coordination of Care []  - Simple Patient / Family Education for ongoing care 0 []  - 0 Complex (extensive) Patient / Family Education for ongoing care []  - 0 Staff obtains , Records, Test Results / Process Orders []  - 0 Staff telephones HHA, Nursing Homes / Clarify orders / etc []  - 0 Routine Transfer to another Facility (non-emergent condition) []  - 0 Routine Hospital Admission (non-emergent condition) []  - 0 New Admissions / / Ordering NPWT, Apligraf, etc. []  - 0 Emergency Hospital Admission (emergent condition) PROCESS - Special Needs []  - Pediatric / Minor Patient Management 0 []  - 0 Isolation Patient Management []  - 0 Hearing / Language / Visual special needs []  - 0 Assessment of Community assistance (transportation, D/C planning, etc.) []  - 0 Additional assistance / Altered mentation []  - 0 Support Surface(s) Assessment (bed, cushion, seat, etc.) INTERVENTIONS - Miscellaneous []  - External ear exam 0 []  - 0 Patient Transfer (multiple staff / / Similar devices) []  - 0 Simple Staple / Suture removal (25 or less) []  - 0 Complex Staple / Suture removal (26 or more) []  - 0 Hypo/Hyperglycemic Management (do not check if billed separately) []  - 0 Ankle / Brachial Index (ABI) - do not check if billed separately Has the patient been seen at the hospital within the last three years: Yes Total Score: 0 Level Of Care: ____ ( ) Electronic Signature(s) Signed: 10/19/2020 4:37:36 PM By: RN Entered By: on 10/19/2020 11:08:48 Bednarczyk, Chiropractor ( ) -------------------------------------------------------------------------------- Compression Therapy Details Patient  Name: [name] [name], [name] Date of Service: 10/19/2020 10:00 AM Medical Record Number: Patient Account Number: Date of Birth/Sex: November 02, 1965 (55 y.o. F) Treating RN: Primary Care [name] [name]: SYSTEM, PCP  Other Clinician: Referring [name] [name]: Willy Eddy Treating [name] [name]/Extender: [name] [name] in Treatment: 4 Compression Therapy Performed for Wound Assessment: Wound #1 Left,Midline,Anterior Lower Leg Performed By: [name] Hanger, RN Compression Type: Double Layer Pre Treatment ABI: 1.2 Post Procedure Diagnosis Same as Pre-procedure Electronic Signature(s) Signed: 10/19/2020 4:37:36 PM By: Rogers Blocker RN Entered By: Rogers Blocker on 10/19/2020 10:58:18 Gravette, [name] [name] ([id number]) -------------------------------------------------------------------------------- Encounter Discharge Information Details Patient Name: [name] [name], [name] Date of Service: 10/19/2020 10:00 AM Medical Record Number: [id number] Patient Account Number: [id number] Date of Birth/Sex: 1966/01/31 (55 y.o. F) Treating RN: Rogers Blocker Primary Care [name] [name]: SYSTEM, PCP Other Clinician: Referring [name] [name]: Willy Eddy Treating [name] [name]/Extender: [name] [name] in Treatment: 4 Encounter Discharge Information Items Post Procedure Vitals Discharge Condition: Stable Temperature (F): 98.6 Ambulatory Status: Wheelchair Pulse (bpm): 76 Discharge Destination: Skilled Nursing Facility Respiratory Rate (breaths/min): 18 Orders Sent: Yes Blood Pressure (mmHg): 137/80 Transportation: Private Auto Accompanied By: caregiver Schedule Follow-up Appointment: Yes Clinical Summary of Care: Electronic Signature(s) Signed: 10/19/2020 4:31:37 PM By: Rogers Blocker RN Entered By: Rogers Blocker on 10/19/2020 16:31:37 Denz, [name] [name] ([id number]) -------------------------------------------------------------------------------- Lower Extremity Assessment  Details Patient Name: [name] [name], [name] Date of Service: 10/19/2020 10:00 AM Medical Record Number: [id number] Patient Account Number: [id number] Date of Birth/Sex: [birth year] (55 y.o. F) Treating RN: Rogers Blocker Primary Care Fuquan Wilson: SYSTEM, PCP Other Clinician: Referring Sahil Milner: Willy Eddy Treating Rubena Roseman/Extender: [name] [name] in Treatment: 4 Edema Assessment Assessed: [Left: Yes] [Right: No] Edema: [Left: Ye] [Right: s] Calf Left: Right: Point of Measurement: 29 cm From Medial Instep 32.4 cm Ankle Left: Right: Point of Measurement: 11 cm From Medial Instep 24.5 cm Vascular Assessment Pulses: Dorsalis Pedis Palpable: [Left:Yes] Electronic Signature(s) Signed: 10/19/2020 4:37:36 PM By: Rogers Blocker RN Entered By: Rogers Blocker on 10/19/2020 10:41:53 Dieppa, [name] [name] ([id number]) -------------------------------------------------------------------------------- Multi Wound Chart Details Patient Name: [name] [name], [name] Date of Service: 10/19/2020 10:00 AM Medical Record Number: [id number] Patient Account Number: [id number] Date of Birth/Sex: [birth year] (55 y.o. F) Treating RN: Rogers Blocker Primary Care Uvaldo Rybacki: SYSTEM, PCP Other Clinician: Referring Randee Huston: Willy Eddy Treating Carrell Palmatier/Extender: [name] [name] in Treatment: 4 Vital Signs Height(in): 68 Pulse(bpm): 76 Weight(lbs): 216 Blood Pressure(mmHg): 137/80 Body Mass Index(BMI): 33 Temperature(F): 98.6 Respiratory Rate(breaths/min): 18 Photos: [N/A:N/A] Wound Location: Left, Midline, Anterior Lower Leg N/A N/A Wounding Event: Trauma N/A N/A Primary Etiology: Trauma, Other N/A N/A Comorbid History: Anemia, Asthma, Chronic N/A N/A Obstructive Pulmonary Disease (COPD), Type II Diabetes, Osteoarthritis Date Acquired: 08/30/2020 N/A N/A Weeks of Treatment: 4 N/A N/A Wound Status: Open N/A N/A Measurements L x W x D (cm) 3.6x2.1x0.2 N/A N/A Area (cm) : 5.938 N/A  N/A Volume (cm) : 1.188 N/A N/A % Reduction in Area: 63.80% N/A N/A % Reduction in Volume: 75.90% N/A N/A Classification: Full Thickness Without Exposed N/A N/A Support Structures Exudate Amount: Medium N/A N/A Exudate Type: Serosanguineous N/A N/A Exudate Color: red, brown N/A N/A Wound Margin: Flat and Intact N/A N/A Granulation Amount: Large (67-100%) N/A N/A Granulation Quality: Red, Hyper-granulation, Friable N/A N/A Necrotic Amount: None Present (0%) N/A N/A Exposed Structures: Fat Layer (Subcutaneous Tissue): N/A N/A Yes Fascia: No Tendon: No Muscle: No Joint: No Bone: No Epithelialization: Medium (34-66%) N/A N/A Treatment Notes Electronic Signature(s) Signed: 10/19/2020 4:37:36 PM By: Rogers Blocker RN Entered By: Rogers Blocker on 10/19/2020 10:56:42 Ishman, [name] [name] ([id number]) -------------------------------------------------------------------------------- Multi-Disciplinary Care Plan Details Patient Name: [name] [name], [name] Date of Service: 10/19/2020 10:00 AM Medical Record  Number: [id number] Patient Account Number: [id number] Date of Birth/Sex: Dec 13, 1965 (55 y.o. F) Treating RN: Rogers BlockerSanchez, Kenia Primary Care Allan Bacigalupi: SYSTEM, PCP Other Clinician: Referring Darlen Gledhill: Willy EddyOBINSON, PATRICK Treating Criag Wicklund/Extender: [name] BlaseStone, Hoyt Weeks in Treatment: 4 Active Inactive Abuse / Safety / Falls / Self Care Management Nursing Diagnoses: History of Falls Potential for falls Goals: Patient will not experience any injury related to falls Date Initiated: 09/20/2020 Target Resolution Date: 10/20/2020 Goal Status: Active Interventions: Podiatry chair, stretcher in low position and side rails up as needed Provide education on safe transfers Notes: Medication Nursing Diagnoses: Knowledge deficit related to medication safety: actual or potential Goals: Patient/caregiver will demonstrate understanding of all current medications Date Initiated: 09/20/2020 Target  Resolution Date: 10/20/2020 Goal Status: Active Patient/caregiver will demonstrate understanding of new [facility] (topical prescriptions are covered under the skin breakdown problem) Date Initiated: 09/20/2020 Target Resolution Date: 10/20/2020 Goal Status: Active Interventions: Assess for medication contraindications each visit where new medications are prescribed Patient/Caregiver given reconciled medication list upon admission, changes in medications and discharge from the Wound Center Notes: Pain, Acute or Chronic Nursing Diagnoses: Pain Management - Non-cyclic Chronic Pain Pain, acute or chronic: actual or potential Potential alteration in comfort, pain Goals: Patient will verbalize adequate pain control and receive pain control interventions during procedures as needed Date Initiated: 09/20/2020 Target Resolution Date: 10/20/2020 Goal Status: Active Interventions: Assess comfort goal upon admission Treatment Activities: Administer pain control measures as ordered : 09/20/2020 [name] [name], [name] ([id number]) Notes: Soft Tissue Infection Nursing Diagnoses: Impaired tissue integrity Knowledge deficit related to disease process and management Knowledge deficit related to home infection control: handwashing, handling of soiled dressings, supply storage Goals: Patient will remain free of wound infection Date Initiated: 09/20/2020 Target Resolution Date: 10/20/2020 Goal Status: Active Patient/caregiver will verbalize understanding of or measures to prevent infection and contamination in the home setting Date Initiated: 09/20/2020 Target Resolution Date: 10/20/2020 Goal Status: Active Signs and symptoms of infection will be recognized early to allow for prompt treatment Date Initiated: 09/20/2020 Target Resolution Date: 10/20/2020 Goal Status: Active Interventions: Assess signs and symptoms of infection every visit Provide education on  infection Notes: Venous Leg Ulcer Nursing Diagnoses: Actual venous Insuffiency (use after diagnosis is confirmed) Knowledge deficit related to disease process and management Potential for venous Insuffiency (use before diagnosis confirmed) Goals: Patient will maintain optimal edema control Date Initiated: 09/20/2020 Target Resolution Date: 10/20/2020 Goal Status: Active Interventions: Provide education on venous insufficiency Notes: Wound/Skin Impairment Nursing Diagnoses: Impaired tissue integrity Goals: Patient/caregiver will verbalize understanding of skin care regimen Date Initiated: 09/20/2020 Target Resolution Date: 10/20/2020 Goal Status: Active Ulcer/skin breakdown will have a volume reduction of 30% by week 4 Date Initiated: 09/20/2020 Target Resolution Date: 10/20/2020 Goal Status: Active Interventions: Assess patient/caregiver ability to obtain necessary supplies Assess ulceration(s) every visit Treatment Activities: Skin care regimen initiated : 09/20/2020 Topical wound management initiated : 09/20/2020 Notes: [name] [name], [name] ([id number]) Electronic Signature(s) Signed: 10/19/2020 4:37:36 PM By: Rogers BlockerSanchez , Kenia RN Entered By: Rogers BlockerSanchez , Kenia on 10/19/2020 10:56:35 [name] [name], [name] BeckersJENNIFER R. ([id number]) -------------------------------------------------------------------------------- Pain Assessment Details Patient Name: Townley, Elektra R. Date of Service: 10/19/2020 10:00 AM Medical Record Number: [id number] Patient Account Number: [id number] Date of Birth/Sex: Dec 13, 1965 (55 y.o. F) Treating RN: Rogers BlockerSanchez, Kenia Primary Care Emersyn Kotarski: SYSTEM, PCP Other Clinician: Referring Lekeith Wulf: Willy EddyOBINSON, PATRICK Treating Dayvian Blixt/Extender: [name] BlaseStone, Hoyt Weeks in Treatment: 4 Active Problems Location of Pain Severity and Description of Pain Patient Has Paino Yes Site Locations Pain Location: Pain in Ulcers Rate  the pain. Current Pain Level: 10 Pain Management and Medication Current  Pain Management: Electronic Signature(s) Signed: 10/19/2020 4:37:36 PM By: Rogers Blocker RN Entered By: Rogers Blocker on 10/19/2020 10:34:48 [name] [name], [name] [name] ([id number]) -------------------------------------------------------------------------------- Patient/Caregiver Education Details Patient Name: [name] [name], Jenaveve R. Date of Service: 10/19/2020 10:00 AM Medical Record Number: [id number] Patient Account Number: [id number] Date of Birth/Gender: March 09, 1965 (55 y.o. F) Treating RN: Rogers Blocker Primary Care Physician: SYSTEM, PCP Other Clinician: Referring Physician: Willy Eddy Treating Physician/Extender: [name] [name] in Treatment: 4 Education Assessment Education Provided To: Patient Education Topics Provided Wound/Skin Impairment: Methods: Explain/Verbal Responses: State content correctly Electronic Signature(s) Signed: 10/19/2020 4:37:36 PM By: Rogers Blocker RN Entered By: Rogers Blocker on 10/19/2020 11:09:06 Schweitzer, [name] [name] ([id number]) -------------------------------------------------------------------------------- Wound Assessment Details Patient Name: Enamorado, Jaylani R. Date of Service: 10/19/2020 10:00 AM Medical Record Number: [id number] Patient Account Number: [id number] Date of Birth/Sex: January 29, 1966 (54 y.o. F) Treating RN: Rogers Blocker Primary Care Chantal Worthey: SYSTEM, PCP Other Clinician: Referring Deaja Rizo: Willy Eddy Treating Aradia Estey/Extender: [name] [name] in Treatment: 4 Wound Status Wound Number: 1 Primary Trauma, Other Etiology: Wound Location: Left, Midline, Anterior Lower Leg Wound Open Wounding Event: Trauma Status: Date Acquired: 08/30/2020 Comorbid Anemia, Asthma, Chronic Obstructive Pulmonary Disease Weeks Of Treatment: 4 History: (COPD), Type II Diabetes, Osteoarthritis Clustered Wound: No Photos Wound Measurements Length: (cm) 3.6 Width: (cm) 2.1 Depth: (cm) 0.2 Area: (cm) 5.938 Volume: (cm) 1.188 %  Reduction in Area: 63.8% % Reduction in Volume: 75.9% Epithelialization: Medium (34-66%) Tunneling: No Undermining: No Wound Description Classification: Full Thickness Without Exposed Support Structu Wound Margin: Flat and Intact Exudate Amount: Medium Exudate Type: Serosanguineous Exudate Color: red, brown res Foul Odor After Cleansing: No Slough/Fibrino No Wound Bed Granulation Amount: Large (67-100%) Exposed Structure Granulation Quality: Red, Hyper-granulation, Friable Fascia Exposed: No Necrotic Amount: None Present (0%) Fat Layer (Subcutaneous Tissue) Exposed: Yes Tendon Exposed: No Muscle Exposed: No Joint Exposed: No Bone Exposed: No Treatment Notes Wound #1 (Lower Leg) Wound Laterality: Left, Midline, Anterior Cleanser Soap and Water Discharge Instruction: Gently cleanse wound with antibacterial soap, rinse and pat dry prior to dressing wounds Peri-Wound Care Kobler, Briannah R. ([id number]) Topical Primary Dressing Hydrofera Blue Ready Transfer Foam, 2.5x2.5 (in/in) Discharge Instruction: Apply Hydrofera Blue Ready to wound bed as directed-cut hydrofera big enough to cover wound bed AND catch drainage. IF USING HYDROFERA CLASSIC, PLASTIC SIDE DOES NOT TOUCH WOUND BED. Please call office for clarification if needed. Secondary Dressing ABD Pad 5x9 (in/in) Discharge Instruction: Cover with ABD pad Secured With Compression Wrap Profore Lite LF 3 Multilayer Compression Bandaging System Discharge Instruction: Apply 3 multi-layer wrap as prescribed. Compression Stockings Add-Ons Electronic Signature(s) Signed: 10/19/2020 4:37:36 PM By: Rogers Blocker RN Entered By: Rogers Blocker on 10/19/2020 10:56:09 Thon, [name] [name] ([id number]) -------------------------------------------------------------------------------- Vitals Details Patient Name: Woolridge, Danayah R. Date of Service: 10/19/2020 10:00 AM Medical Record Number: [id number] Patient Account Number:  [id number] Date of Birth/Sex: Jul 09, 1965 (55 y.o. F) Treating RN: Rogers Blocker Primary Care Tiphani Mells: SYSTEM, PCP Other Clinician: Referring Tariya Morrissette: Willy Eddy Treating Pistol Kessenich/Extender: [name] [name] in Treatment: 4 Vital Signs Time Taken: 10:33 Temperature (F): 98.6 Height (in): 68 Pulse (bpm): 76 Weight (lbs): 216 Respiratory Rate (breaths/min): 18 Body Mass Index (BMI): 32.8 Blood Pressure (mmHg): 137/80 Reference Range: 80 - 120 mg / dl Electronic Signature(s) Signed: 10/19/2020 4:37:36 PM By: Rogers Blocker RN Entered By: Rogers Blocker on 10/19/2020 10:34:28

## 2020-10-25 ENCOUNTER — Other Ambulatory Visit: Payer: Self-pay

## 2020-10-25 ENCOUNTER — Emergency Department
Admission: EM | Admit: 2020-10-25 | Discharge: 2020-10-25 | Disposition: A | Discharge status: Home / Self Care | Payer: Medicaid Other | Attending: Emergency Medicine | Admitting: Emergency Medicine

## 2020-10-25 ENCOUNTER — Emergency Department: Payer: Medicaid Other

## 2020-10-25 ENCOUNTER — Encounter: Payer: Self-pay | Admitting: Emergency Medicine

## 2020-10-25 DIAGNOSIS — E1122 Type 2 diabetes mellitus with diabetic chronic kidney disease: Secondary | ICD-10-CM | POA: Diagnosis not present

## 2020-10-25 DIAGNOSIS — J449 Chronic obstructive pulmonary disease, unspecified: Secondary | ICD-10-CM | POA: Diagnosis not present

## 2020-10-25 DIAGNOSIS — Z79899 Other long term (current) drug therapy: Secondary | ICD-10-CM | POA: Insufficient documentation

## 2020-10-25 DIAGNOSIS — Z91040 Latex allergy status: Secondary | ICD-10-CM | POA: Insufficient documentation

## 2020-10-25 DIAGNOSIS — G8929 Other chronic pain: Secondary | ICD-10-CM | POA: Insufficient documentation

## 2020-10-25 DIAGNOSIS — Z96651 Presence of right artificial knee joint: Secondary | ICD-10-CM | POA: Insufficient documentation

## 2020-10-25 DIAGNOSIS — N189 Chronic kidney disease, unspecified: Secondary | ICD-10-CM | POA: Diagnosis not present

## 2020-10-25 DIAGNOSIS — Z7951 Long term (current) use of inhaled steroids: Secondary | ICD-10-CM | POA: Diagnosis not present

## 2020-10-25 DIAGNOSIS — I129 Hypertensive chronic kidney disease with stage 1 through stage 4 chronic kidney disease, or unspecified chronic kidney disease: Secondary | ICD-10-CM | POA: Diagnosis not present

## 2020-10-25 DIAGNOSIS — M25562 Pain in left knee: Secondary | ICD-10-CM | POA: Diagnosis not present

## 2020-10-25 DIAGNOSIS — M62838 Other muscle spasm: Secondary | ICD-10-CM | POA: Diagnosis not present

## 2020-10-25 DIAGNOSIS — F17210 Nicotine dependence, cigarettes, uncomplicated: Secondary | ICD-10-CM | POA: Insufficient documentation

## 2020-10-25 DIAGNOSIS — D631 Anemia in chronic kidney disease: Secondary | ICD-10-CM | POA: Insufficient documentation

## 2020-10-25 LAB — CBG MONITORING, ED: Glucose-Capillary: 109 mg/dL — ABNORMAL HIGH (ref 70–99)

## 2020-10-25 MED ORDER — HYDROCODONE-ACETAMINOPHEN 5-325 MG PO TABS
1.0000 | ORAL_TABLET | Freq: Once | ORAL | Status: AC
Start: 2020-10-25 — End: 2020-10-25
  Administered 2020-10-25: 1 via ORAL
  Filled 2020-10-25: qty 1

## 2020-10-25 MED ORDER — ACETAMINOPHEN 325 MG PO TABS
650.0000 mg | ORAL_TABLET | Freq: Once | ORAL | Status: AC
  Administered 2020-10-25: 650 mg via ORAL
  Filled 2020-10-25: qty 2

## 2020-10-25 MED ORDER — CYCLOBENZAPRINE HCL 10 MG PO TABS
10.0000 mg | ORAL_TABLET | Freq: Once | ORAL | Status: AC
  Administered 2020-10-25: 10 mg via ORAL
  Filled 2020-10-25: qty 1

## 2020-10-25 NOTE — ED Provider Notes (Signed)
[facility] Emergency Department Provider Note  ____________________________________________   Event Date/Time   First MD Initiated Contact with Patient 10/25/20 0745     (approximate)  I have reviewed the triage vital signs and the nursing notes.   HISTORY  Chief Complaint Neck Pain    HPI [name] [name] is a 55 y.o. female presents to the emergency department via [facility].  Patient was noted to have neck pain and knee pain.  Patient states the neck pain feels like a crick in her neck.  Patient states the knee pain is when she tries to move the left leg.  States she always has pain in her left leg.  Patient also has a wound which is being cared for by the wound clinic.  She denies fever or chills.  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   Acute lower UTI 01/04/2020   Pressure injury of skin 01/03/2020   Dysphagia 01/03/2020   Hypocalcemia 01/03/2020   Anemia in chronic kidney disease (CKD) 01/03/2020   Sepsis (HCC) 01/02/2020   Type 2 diabetes mellitus with hypoglycemia without coma (HCC) 01/02/2020   AKI (acute kidney injury) (HCC) 01/01/2020   Hyperlipidemia    Essential hypertension    Depression with anxiety    Chronic obstructive pulmonary disease (HCC)    Tobacco abuse 10/21/2019   Sensory disturbance 10/21/2019   TIA (transient ischemic attack) 10/20/2019   Altered mental status    Loss of consciousness (HCC) 09/08/2018   Encephalopathy 09/08/2018   S/P TKR (total knee replacement) using  cement, 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   Anxiety 04/02/2017   Urinary incontinence 01/25/2016   Fecal incontinence 01/25/2016   Dysthymia 01/25/2016    Past Surgical History:  Procedure Laterality Date   COLONOSCOPY WITH PROPOFOL N/A 11/27/2018   Procedure: COLONOSCOPY WITH PROPOFOL;  Surgeon: Toledo, Boykin Nearing, MD;  Location: [facility] ENDOSCOPY;  Service: Gastroenterology;  Laterality: N/A;   ESOPHAGOGASTRODUODENOSCOPY (EGD) WITH PROPOFOL N/A 11/27/2018   Procedure: ESOPHAGOGASTRODUODENOSCOPY (EGD) WITH PROPOFOL;  Surgeon: Toledo, Boykin Nearing, MD;  Location: [facility] ENDOSCOPY;  Service: Gastroenterology;  Laterality: N/A;   NO PAST SURGERIES     TOTAL KNEE ARTHROPLASTY Right 10/03/2017   Procedure: TOTAL KNEE ARTHROPLASTY;  Surgeon: Lyndle Herrlich, MD;  Location: [facility] ORS;  Service: Orthopedics;  Laterality: Right;    Prior to Admission medications   Medication Sig Start Date End Date Taking? Authorizing Provider  acetaminophen (TYLENOL) 650 MG CR tablet Take 650 mg by mouth every 8 (eight) hours as needed for pain.    [provider]  albuterol (PROVENTIL HFA;VENTOLIN HFA) 108 (90 Base) MCG/ACT inhaler Inhale 2 puffs into the lungs every 4 (four) hours as needed for wheezing or shortness of breath.    [provider]  amLODipine (NORVASC) 5 MG tablet Take 1 tablet (5 mg total) by mouth every morning. 01/08/20 02/07/20  Sherryll Burger, Pratik D, DO  atorvastatin (LIPITOR) 40 MG  tablet Take 40 mg by mouth at bedtime. (2000)    [provider]  busPIRone (BUSPAR) 15 MG tablet Take 1 tablet (15 mg total) by mouth 3 (three) times daily. 02/19/20   Aldean Baker, NP  cholecalciferol (VITAMIN D) 25 MCG (1000 UNIT) tablet Take 1 tablet (1,000 Units total) by mouth daily. (0800) Patient not taking: Reported on 01/01/2020 10/22/19   Vassie Loll, MD  cholecalciferol (VITAMIN D3) 25 MCG (1000 UT) tablet Take 1,000 Units by mouth every morning.    [provider]  clopidogrel (PLAVIX) 75 MG  tablet Take 1 tablet (75 mg total) by mouth daily with breakfast. 10/23/19   Vassie Loll, MD  Dexlansoprazole 30 MG capsule Take 30 mg by mouth daily.    [provider]  diclofenac sodium (VOLTAREN) 1 % GEL Apply 2 g topically 4 (four) times daily as needed. Patient taking differently: Apply 2 g topically in the morning and at bedtime. 09/13/18   Briant Cedar, MD  docusate sodium (COLACE) 100 MG capsule Take 1 capsule (100 mg total) by mouth 2 (two) times daily. Patient not taking: Reported on 01/01/2020 10/22/19   Vassie Loll, MD  ferrous sulfate 325 (65 FE) MG tablet Take 1 tablet (325 mg total) by mouth 2 (two) times daily with a meal. Patient taking differently: Take 325 mg by mouth daily with breakfast. 09/13/18 01/01/20  Briant Cedar, MD  Fluticasone-Salmeterol (ADVAIR) 100-50 MCG/DOSE AEPB Inhale 1 puff into the lungs 2 (two) times daily.    [provider]  folic acid (FOLVITE) 1 MG tablet Take 1 mg by mouth daily.    [provider]  furosemide (LASIX) 20 MG tablet Take 1 tablet (20 mg total) by mouth 2 (two) times daily. (0800) Patient taking differently: Take 30 mg by mouth 2 (two) times daily. (0800) 10/22/19   Vassie Loll, MD  guaiFENesin (ROBITUSSIN) 100 MG/5ML SOLN Take 5 mLs by mouth 3 (three) times daily.    [provider]  HYDROcodone-acetaminophen (NORCO/VICODIN) 5-325 MG tablet Take 1 tablet by mouth every 4 (four) hours as needed for severe pain. 09/22/20 09/22/21  Shaune Pollack, MD  LANTUS SOLOSTAR 100 UNIT/ML Solostar Pen Inject 12 Units into the skin at bedtime. 01/13/20   [provider]  LORazepam (ATIVAN) 1 MG tablet Take 1 mg by mouth 2 (two) times daily as needed for anxiety. 02/09/20   [provider]  megestrol (MEGACE) 40 MG tablet Take 40 mg by mouth daily.    [provider]  nicotine (NICODERM CQ - DOSED IN MG/24 HOURS) 21 mg/24hr patch Place 1 patch (21 mg total) onto the skin  daily. Patient not taking: No sig reported 10/23/19   Vassie Loll, MD  pantoprazole (PROTONIX) 40 MG tablet Take 1 tablet (40 mg total) by mouth daily. 09/14/18 10/20/19  Briant Cedar, MD  sodium bicarbonate 650 MG tablet Take 650 mg by mouth daily. 02/17/20   [provider]  traMADol (ULTRAM) 50 MG tablet Take 50 mg by mouth 4 (four) times daily.  09/30/19   [provider]  traZODone (DESYREL) 100 MG tablet Take 100 mg by mouth at bedtime.    [provider]  vitamin B-12 (CYANOCOBALAMIN) 1000 MCG tablet Take 1,000 mcg by mouth daily.    [provider]  zolpidem (AMBIEN) 10 MG tablet Take 10 mg by mouth at bedtime as needed. 02/09/20   [provider]    Allergies Latex  Family History  Problem Relation  Age of Onset   Heart disease Mother    Heart disease Father     Social History Social History   Tobacco Use   Smoking status: Every Day    Types: Cigarettes   Smokeless tobacco: Never  Vaping Use   Vaping Use: Never used  Substance Use Topics   Alcohol use: No   Drug use: No    Review of Systems  Constitutional: No fever/chills Eyes: No visual changes. ENT: No sore throat. Respiratory: Denies cough Cardiovascular: Denies chest pain Gastrointestinal: Denies abdominal pain Genitourinary: Negative for dysuria. Musculoskeletal: Negative for back pain.  Positive for left knee pain and neck pain Skin: Negative for rash. Psychiatric: no mood changes,     ____________________________________________   PHYSICAL EXAM:  VITAL SIGNS: ED Triage Vitals  Enc Vitals Group     BP 10/25/20 0657 132/81     Pulse Rate 10/25/20 0657 76     Resp 10/25/20 0657 18     Temp 10/25/20 0657 99 F (37.2 C)     Temp Source 10/25/20 0657 Oral     SpO2 10/25/20 0657 99 %     Weight 10/25/20 0702 215 lb 13.3 oz (97.9 kg)     Height 10/25/20 0702 5\' 8"  (1.727 m)     Head Circumference --      Peak Flow --      Pain Score 10/25/20  0701 10     Pain Loc --      Pain Edu? --      Excl. in GC? --     Constitutional: Alert and oriented. Well appearing and in no acute distress. Eyes: Conjunctivae are normal.  Head: Atraumatic. Nose: No congestion/rhinnorhea. Mouth/Throat: Mucous membranes are moist.   Neck:  supple no lymphadenopathy noted Cardiovascular: Normal rate, regular rhythm. Heart sounds are normal Respiratory: Normal respiratory effort.  No retractions, lungs c t a  GU: deferred Musculoskeletal: C-spine is nontender, paravertebral muscles are nontender, no spasms noted, left knee has reproduced pain with movement, is nontender to palpation, left lower leg has a healing wound and actually appears to be improved from the last photo in the patient's chart from 2 weeks ago.   Neurologic:  Normal speech and language.  Skin:  Skin is warm, dry and intact. No rash noted. Psychiatric: Mood and affect are normal. Speech and behavior are normal.  ____________________________________________   LABS (all labs ordered are listed, but only abnormal results are displayed)  Labs Reviewed  CBG MONITORING, ED - Abnormal; Notable for the following components:      Result Value   Glucose-Capillary 109 (*)    All other components within normal limits   ____________________________________________   ____________________________________________  RADIOLOGY    ____________________________________________   PROCEDURES  Procedure(s) performed: No  Procedures    ____________________________________________   INITIAL IMPRESSION / ASSESSMENT AND PLAN / ED COURSE  Pertinent labs & imaging results that were available during my care of the patient were reviewed by me and considered in my medical decision making (see chart for details).   Patient is a 55 year old female presents emergency department with neck and knee pain.  See HPI.  Physical exam shows patient appears stable  Flexeril, Tylenol, hydrocodone  given to the patient  X-ray attempts were unsuccessful as patient will not lay flat on the table  I do not feel the patient is septic.  The wound at her left lower leg appears to be improving greatly.  We did redress the bandage.  It appeared that the bandage had been on too tight.  She is to follow-up with her regular doctor as needed.  Return emergency department worsening.  Patient is resting comfortably and is no longer complaining of pain.  States her neck is much better.  Chronic leg pain.  She states this is no different than her normal pain.  Afebrile at discharge.     Jerilee HohJennifer R Cuff was evaluated in Emergency Department on 10/25/2020 for the symptoms described in the history of present illness. She was evaluated in the context of the global COVID-19 pandemic, which necessitated consideration that the patient might be at risk for infection with the SARS-CoV-2 virus that causes COVID-19. Institutional protocols and algorithms that pertain to the evaluation of patients at risk for COVID-19 are in a state of rapid change based on information released by regulatory bodies including the CDC and federal and state organizations. These policies and algorithms were followed during the patient's care in the ED.    As part of my medical decision making, I reviewed the following data within the electronic MEDICAL RECORD NUMBER Nursing notes reviewed and incorporated, Labs reviewed , Old chart reviewed, Notes from prior ED visits, and [place] Controlled Substance Database  ____________________________________________   FINAL CLINICAL IMPRESSION(S) / ED DIAGNOSES  Final diagnoses:  Chronic pain of left knee  Muscle spasms of neck      NEW MEDICATIONS STARTED DURING THIS VISIT:  New Prescriptions   No medications on file     Note:  This document was prepared using Dragon voice recognition software and may include unintentional dictation errors.    Faythe GheeFisher, Mackenzi Krogh W, PA-C 10/25/20 [phone]    Georga HackingMcHugh,  Kelly Rose, MD 10/25/20 [phone]

## 2020-10-25 NOTE — ED Triage Notes (Addendum)
Pt here for left side neck pain that started while sleeping; reports has a kink in her neck. Pain worse with movement.  Has wounds to left leg that is wrapped.  Denies change in drainage to leg. No fevers. Blood sugars have been normal. Pt has pain in left leg but always has pain.  VSS.  NAD. Unlabored.   brianna RN attempting to call legal guardian.  Discussed with dr Sidney Ace, no labs.

## 2020-10-25 NOTE — Discharge Instructions (Addendum)
Follow up with your regular doctor as needed Pain medication as previously prescribed for acute pain Tylenol 650mg  every 8 hours prn pain

## 2020-10-25 NOTE — ED Notes (Signed)
This RN contacted Beryle Lathe Hodge/legal guardian to notify of pt arrival to ED, confidential VM left.

## 2020-10-25 NOTE — ED Triage Notes (Signed)
Pt to ED via [facility] from [facility], per EMS pt with c/o L sided neck pain that is worse when she turns her head to the right. Per EMS pt with bilateral leg pain and swellling and is being followed by wound care. Per EMS pt has not had morning meds/breakfast yet.   VSS en route to ED 128/72 74HR 97% RA CBG 112

## 2020-10-25 NOTE — ED Notes (Signed)
See triage note  Presents with pain to neck  States she developed left side neck pain this am   Also having some pain to left lower leg  Pt presents with dressing and wrap to leg

## 2020-11-01 ENCOUNTER — Encounter: Payer: Medicaid Other | Attending: Physician Assistant | Admitting: Physician Assistant

## 2020-11-01 ENCOUNTER — Other Ambulatory Visit: Payer: Self-pay

## 2020-11-01 DIAGNOSIS — E11622 Type 2 diabetes mellitus with other skin ulcer: Secondary | ICD-10-CM | POA: Diagnosis present

## 2020-11-01 DIAGNOSIS — F0150 Vascular dementia without behavioral disturbance: Secondary | ICD-10-CM | POA: Diagnosis not present

## 2020-11-01 DIAGNOSIS — L97822 Non-pressure chronic ulcer of other part of left lower leg with fat layer exposed: Secondary | ICD-10-CM | POA: Diagnosis not present

## 2020-11-01 DIAGNOSIS — X58XXXA Exposure to other specified factors, initial encounter: Secondary | ICD-10-CM | POA: Diagnosis not present

## 2020-11-01 DIAGNOSIS — S81812A Laceration without foreign body, left lower leg, initial encounter: Secondary | ICD-10-CM | POA: Diagnosis not present

## 2020-11-01 NOTE — Progress Notes (Addendum)
ETOLA, MULL ([id number]) Visit Report for 11/01/2020 Chief Complaint Document Details Patient Name: [name] [name] VIDOVICH. Date of Service: 11/01/2020 10:00 AM Medical Record Number: [id number] Patient Account Number: [id number] Date of Birth/Sex: Jan 31, 1966 (55 y.o. F) Treating RN: Rogers Blocker Primary Care Provider: SYSTEM, PCP Other Clinician: Referring Provider: Willy Eddy Treating Provider/Extender: [name] in Treatment: 6 Information Obtained from: Patient Chief Complaint Left leg traumatic ulcer Electronic Signature(s) Signed: 11/01/2020 10:12:59 AM By: Lenda Kelp PA-C Entered By: Lenda Kelp on 11/01/2020 10:12:59 [name] [name] ([id number]) -------------------------------------------------------------------------------- HPI Details Patient Name: [name] [name] R. Date of Service: 11/01/2020 10:00 AM Medical Record Number: [id number] Patient Account Number: [id number] Date of Birth/Sex: 05/01/1965 (55 y.o. F) Treating RN: Rogers Blocker Primary Care Provider: SYSTEM, PCP Other Clinician: Referring Provider: Willy Eddy Treating Provider/Extender: [name] in Treatment: 6 History of Present Illness HPI Description: 09/20/2020 upon evaluation today patient presents for initial inspection here in our clinic concerning issues that she has been having with a trauma wound with a skin tear on the anterior portion of her left leg after getting in a encounter with another resident at the facility at the [facility]. Subsequently she is having quite a bit of pain according to what she is telling me today. There does not appear to be any signs of active infection which is great news. With that being said I do see some signs of hypergranulation so I believe there is a little bit too much moisture going on I am not sure what dressing wound they are using as all she had on was basically just a protective dressing today nothing really  more. Either way I think that she would actually do well with [facility] which is probably the best option currently based on what I am seeing. The surface otherwise looks good just a little bit hypergranular. She does have a history of seemingly dementia along with some other psychiatric conditions in general. She does have diabetes mellitus type 2 as well. 10/11/2020 upon evaluation today patient appears to be doing decently well in regard to her wound. Fortunately there does not appear to be any signs of active infection which is great news. The biggest issue is that the [facility] was put on backwards. Unfortunately this was not the ready transfer and therefore has a back which does not allow any absorption this did not do too well for the patient to be perfectly honest. Nonetheless the wound does not look worse but there is still some hypergranulation I think that silver nitrate will be helpful today. 10/19/2020 upon evaluation today patient appears to be doing well with regard to her wound. Overall I feel like this is significantly improved which is great news. Nonetheless I feel like there is still some hypergranulation that I would like to try to trim down a little bit today with debridement and I discussed that with the patient she is in agreement with that. 11/01/2020 upon evaluation today patient appears to be doing well with regard to her wound on the leg. She has been tolerating the dressing changes without complication and fortunately there does not appear to be any signs of active infection systemically at this time which is great news. No fevers, chills, nausea, vomiting, or diarrhea. Electronic Signature(s) Signed: 11/01/2020 11:14:54 AM By: Lenda Kelp PA-C Entered By: Lenda Kelp on 11/01/2020 11:14:54 [name] [name] ([id number]) -------------------------------------------------------------------------------- Physical Exam Details Patient Name: [name] [name]  R. Date of Service: 11/01/2020  10:00 AM Medical Record Number: [id number] Patient Account Number: [id number] Date of Birth/Sex: 1965-05-26 (55 y.o. F) Treating RN: Rogers Blocker Primary Care Provider: SYSTEM, PCP Other Clinician: Referring Provider: Willy Eddy Treating Provider/Extender: [name] in Treatment: 6 Constitutional Well-nourished and well-hydrated in no acute distress. Respiratory normal breathing without difficulty. Psychiatric this patient is able to make decisions and demonstrates good insight into disease process. Alert and Oriented x 3. pleasant and cooperative. Notes Upon inspection patient's wound bed showed signs of good granulation epithelization at this point. I do think the Hydrofera Blue is doing a great job and overall I am extremely happy in that regard. My suggestion is going to be that we go ahead and continue with the measures as before with regard to the [facility] dressing. Electronic Signature(s) Signed: 11/01/2020 11:15:34 AM By: Lenda Kelp PA-C Entered By: Lenda Kelp on 11/01/2020 11:15:34 [name] [name] ([id number]) -------------------------------------------------------------------------------- Physician Orders Details Patient Name: [name] [name] R. Date of Service: 11/01/2020 10:00 AM Medical Record Number: [id number] Patient Account Number: [id number] Date of Birth/Sex: Mar 31, 1965 (55 y.o. F) Treating RN: Rogers Blocker Primary Care Provider: SYSTEM, PCP Other Clinician: Referring Provider: Willy Eddy Treating Provider/Extender: [name] in Treatment: 6 Verbal / Phone Orders: No Diagnosis Coding ICD-10 Coding Code Description [id number] Laceration without foreign body, left lower leg, initial encounter L97.822 Non-pressure chronic ulcer of other part of left lower leg with fat layer exposed E11.622 Type 2 diabetes mellitus with other skin ulcer F01.50 Vascular dementia without behavioral  disturbance Follow-up Appointments o Return Appointment in 2 weeks. - [facility]-PLEASE SCHEDULE ACCORDINGLY AS PATIENT WILL NOT BE SEEN FOR 2 WEEKS o Nurse Visit as needed - call to set up if needed Home Health o Home Health Company: - [facility] o [facility] for wound care. May utilize formulary equivalent dressing for wound treatment orders unless otherwise specified. Home Health Nurse may visit PRN to address patientos wound care needs. o Scheduled days for dressing changes to be completed; exception, patient has scheduled wound care visit that day. o **Please direct any NON-WOUND related issues/requests for orders to patient's Primary Care Physician. **If current dressing causes regression in wound condition, may D/C ordered dressing product/s and apply Normal Saline Moist Dressing daily until next Wound Healing Center or Other MD appointment. **Notify Wound Healing Center of regression in wound condition at [phone]. Bathing/ Shower/ Hygiene o May shower with wound dressing protected with water repellent cover or cast protector. - DO NOT GET COMPRESSION WRAP WET Anesthetic (Use 'Patient Medications' Section for Anesthetic Order Entry) o Lidocaine applied to wound bed Edema Control - Lymphedema / Segmental Compressive Device / Other o Optional: One layer of unna paste to top of compression wrap (to act as an anchor). Wound Treatment Wound #1 - Lower Leg Wound Laterality: Left, Midline, Anterior Cleanser: Soap and Water 2 x Per Week/30 Days Discharge Instructions: Gently cleanse wound with antibacterial soap, rinse and pat dry prior to dressing wounds Primary Dressing: Hydrofera Blue Ready Transfer Foam, 2.5x2.5 (in/in) 2 x Per Week/30 Days Discharge Instructions: Apply Hydrofera Blue Ready to wound bed as directed-cut hydrofera big enough to cover wound bed AND catch drainage. IF USING HYDROFERA CLASSIC, PLASTIC SIDE DOES NOT TOUCH WOUND BED. Please  call office for clarification if needed. Secondary Dressing: ABD Pad 5x9 (in/in) 2 x Per Week/30 Days Discharge Instructions: Cover with ABD pad Compression Wrap: Profore Lite LF 3 Multilayer Compression Bandaging System 2 x Per Week/30 Days Discharge Instructions:  Apply 3 multi-layer wrap as prescribed. Electronic Signature(s) Signed: 11/01/2020 4:52:35 PM By: Rogers Blocker RN Signed: 11/01/2020 6:07:16 PM By: Lenda Kelp PA-C Entered By: Rogers Blocker on 11/01/2020 11:06:38 Lafauci, [name] [name] ([id number]) Pancake, [name] [name] ([id number]) -------------------------------------------------------------------------------- Problem List Details Patient Name: [name] [name] R. Date of Service: 11/01/2020 10:00 AM Medical Record Number: [id number] Patient Account Number: [id number] Date of Birth/Sex: April 24, 1965 (55 y.o. F) Treating RN: Rogers Blocker Primary Care Provider: SYSTEM, PCP Other Clinician: Referring Provider: Willy Eddy Treating Provider/Extender: [name] in Treatment: 6 Active Problems ICD-10 Encounter Code Description Active Date MDM Diagnosis S81.812A Laceration without foreign body, left lower leg, initial encounter 09/20/2020 No Yes L97.822 Non-pressure chronic ulcer of other part of left lower leg with fat layer 09/20/2020 No Yes exposed E11.622 Type 2 diabetes mellitus with other skin ulcer 09/20/2020 No Yes F01.50 Vascular dementia without behavioral disturbance 09/20/2020 No Yes Inactive Problems Resolved Problems Electronic Signature(s) Signed: 11/01/2020 10:12:53 AM By: Lenda Kelp PA-C Entered By: Lenda Kelp on 11/01/2020 10:12:53 Poffenberger, [name] [name] ([id number]) -------------------------------------------------------------------------------- Progress Note Details Patient Name: [name] [name] R. Date of Service: 11/01/2020 10:00 AM Medical Record Number: [id number] Patient Account Number: [id number] Date of Birth/Sex: [birth year] (55  y.o. F) Treating RN: Rogers Blocker Primary Care Provider: SYSTEM, PCP Other Clinician: Referring Provider: Willy Eddy Treating Provider/Extender: [name] in Treatment: 6 Subjective Chief Complaint Information obtained from Patient Left leg traumatic ulcer History of Present Illness (HPI) 09/20/2020 upon evaluation today patient presents for initial inspection here in our clinic concerning issues that she has been having with a trauma wound with a skin tear on the anterior portion of her left leg after getting in a encounter with another resident at the facility at the [facility]. Subsequently she is having quite a bit of pain according to what she is telling me today. There does not appear to be any signs of active infection which is great news. With that being said I do see some signs of hypergranulation so I believe there is a little bit too much moisture going on I am not sure what dressing wound they are using as all she had on was basically just a protective dressing today nothing really more. Either way I think that she would actually do well with [facility] which is probably the best option currently based on what I am seeing. The surface otherwise looks good just a little bit hypergranular. She does have a history of seemingly dementia along with some other psychiatric conditions in general. She does have diabetes mellitus type 2 as well. 10/11/2020 upon evaluation today patient appears to be doing decently well in regard to her wound. Fortunately there does not appear to be any signs of active infection which is great news. The biggest issue is that the [facility] was put on backwards. Unfortunately this was not the ready transfer and therefore has a back which does not allow any absorption this did not do too well for the patient to be perfectly honest. Nonetheless the wound does not look worse but there is still some hypergranulation I think that silver  nitrate will be helpful today. 10/19/2020 upon evaluation today patient appears to be doing well with regard to her wound. Overall I feel like this is significantly improved which is great news. Nonetheless I feel like there is still some hypergranulation that I would like to try to trim down a little bit today with debridement and I discussed  that with the patient she is in agreement with that. 11/01/2020 upon evaluation today patient appears to be doing well with regard to her wound on the leg. She has been tolerating the dressing changes without complication and fortunately there does not appear to be any signs of active infection systemically at this time which is great news. No fevers, chills, nausea, vomiting, or diarrhea. Objective Constitutional Well-nourished and well-hydrated in no acute distress. Vitals Time Taken: 10:30 AM, Height: 68 in, Weight: 216 lbs, BMI: 32.8, Temperature: 99.1 F, Pulse: 69 bpm, Respiratory Rate: 18 breaths/min, Blood Pressure: 148/87 mmHg. Respiratory normal breathing without difficulty. Psychiatric this patient is able to make decisions and demonstrates good insight into disease process. Alert and Oriented x 3. pleasant and cooperative. General Notes: Upon inspection patient's wound bed showed signs of good granulation epithelization at this point. I do think the Hydrofera Blue is doing a great job and overall I am extremely happy in that regard. My suggestion is going to be that we go ahead and continue with the measures as before with regard to the [facility] dressing. Integumentary (Hair, Skin) Wound #1 status is Open. Original cause of wound was Trauma. The date acquired was: 08/30/2020. The wound has been in treatment 6 weeks. The wound is located on the Left,Midline,Anterior Lower Leg. The wound measures 2cm length x 1cm width x 0.1cm depth; 1.571cm^2 area and 0.157cm^3 volume. There is Fat Layer (Subcutaneous Tissue) exposed. There is no tunneling  or undermining noted. There is a medium amount of serosanguineous drainage noted. The wound margin is flat and intact. There is large (67-100%) red, friable granulation within the wound bed. There is no necrotic tissue within the wound bed. [name] [name] LONGSTRETH ([id number]) Assessment Active Problems ICD-10 Laceration without foreign body, left lower leg, initial encounter Non-pressure chronic ulcer of other part of left lower leg with fat layer exposed Type 2 diabetes mellitus with other skin ulcer Vascular dementia without behavioral disturbance Procedures Wound #1 Pre-procedure diagnosis of Wound #1 is a Trauma, Other located on the Left,Midline,Anterior Lower Leg . There was a Three Layer Compression Therapy Procedure with a pre-treatment ABI of 1.2 by Rogers Blocker, RN. Post procedure Diagnosis Wound #1: Same as Pre-Procedure Plan Follow-up Appointments: Return Appointment in 2 weeks. - [facility]-PLEASE SCHEDULE ACCORDINGLY AS PATIENT WILL NOT BE SEEN FOR 2 WEEKS Nurse Visit as needed - call to set up if needed Home Health: Home Health Company: - [facility] for wound care. May utilize formulary equivalent dressing for wound treatment orders unless otherwise specified. Home Health Nurse may visit PRN to address patient s wound care needs. Scheduled days for dressing changes to be completed; exception, patient has scheduled wound care visit that day. **Please direct any NON-WOUND related issues/requests for orders to patient's Primary Care Physician. **If current dressing causes regression in wound condition, may D/C ordered dressing product/s and apply Normal Saline Moist Dressing daily until next Wound Healing Center or Other MD appointment. **Notify Wound Healing Center of regression in wound condition at [phone]. Bathing/ Shower/ Hygiene: May shower with wound dressing protected with water repellent cover or cast protector. - DO NOT GET COMPRESSION WRAP  WET Anesthetic (Use 'Patient Medications' Section for Anesthetic Order Entry): Lidocaine applied to wound bed Edema Control - Lymphedema / Segmental Compressive Device / Other: Optional: One layer of unna paste to top of compression wrap (to act as an anchor). WOUND #1: - Lower Leg Wound Laterality: Left, Midline, Anterior Cleanser: Soap and  Water 2 x Per Week/30 Days Discharge Instructions: Gently cleanse wound with antibacterial soap, rinse and pat dry prior to dressing wounds Primary Dressing: Hydrofera Blue Ready Transfer Foam, 2.5x2.5 (in/in) 2 x Per Week/30 Days Discharge Instructions: Apply Hydrofera Blue Ready to wound bed as directed-cut hydrofera big enough to cover wound bed AND catch drainage. IF USING HYDROFERA CLASSIC, PLASTIC SIDE DOES NOT TOUCH WOUND BED. Please call office for clarification if needed. Secondary Dressing: ABD Pad 5x9 (in/in) 2 x Per Week/30 Days Discharge Instructions: Cover with ABD pad Compression Wrap: Profore Lite LF 3 Multilayer Compression Bandaging System 2 x Per Week/30 Days Discharge Instructions: Apply 3 multi-layer wrap as prescribed. 1. Would recommend currently that we going continue with the wound care measures as before [facility] which I think is doing awesome job. 2. Also can recommend that we have the patient continue with the ABD pad to cover followed by a 3 layer compression wrap which is doing well to help control her edema. 3. I am also going to recommend patient continue to elevate her legs much as possible to help with edema control. We will see patient back for reevaluation in 1 week here in the clinic. If anything worsens or changes patient will contact our office for additional recommendations. Electronic Signature(s) Signed: 11/01/2020 11:16:16 AM By: Buren KosStone III, Johnnette Laux PA-C [name] [name] R. ([id number]) Entered By: Lenda KelpStone III, Maksym Pfiffner on 11/01/2020 11:16:16 [name] [name] BeckersJENNIFER R.  ([id number]) -------------------------------------------------------------------------------- SuperBill Details Patient Name: Deans, Najae R. Date of Service: 11/01/2020 Medical Record Number: [id number] Patient Account Number: [id number] Date of Birth/Sex: 1965-08-30 (55 y.o. F) Treating RN: Rogers BlockerSanchez, Kenia Primary Care Provider: SYSTEM, PCP Other Clinician: Referring Provider: Willy EddyOBINSON, PATRICK Treating Provider/Extender: Rowan BlaseStone, Kaycie Pegues Weeks in Treatment: 6 Diagnosis Coding ICD-10 Codes Code Description [phone] Laceration without foreign body, left lower leg, initial encounter L97.822 Non-pressure chronic ulcer of other part of left lower leg with fat layer exposed E11.622 Type 2 diabetes mellitus with other skin ulcer F01.50 Vascular dementia without behavioral disturbance Facility Procedures CPT4 Code: [id number] Description: (Facility Use Only) [phone] - APPLY MULTLAY COMPRS LWR LT LEG Modifier: Quantity: 1 Physician Procedures CPT4 Code: [id number] Description: 99213 - WC PHYS LEVEL 3 - EST PT Modifier: Quantity: 1 CPT4 Code: Description: ICD-10 Diagnosis Description S81.812A Laceration without foreign body, left lower leg, initial encounter L97.822 Non-pressure chronic ulcer of other part of left lower leg with fat lay E11.622 Type 2 diabetes mellitus with other skin ulcer  F01.50 Vascular dementia without behavioral disturbance Modifier: er exposed Quantity: Electronic Signature(s) Signed: 11/01/2020 11:16:28 AM By: Lenda KelpStone III, Lucyann Romano PA-C Entered By: Lenda KelpStone III, Ebonie Westerlund on 11/01/2020 11:16:28

## 2020-11-01 NOTE — Progress Notes (Addendum)
AKARI, DEFELICE ([id number]) Visit Report for 11/01/2020 Arrival Information Details Patient Name: [name] [name], [name] [name]. Date of Service: 11/01/2020 10:00 AM Medical Record Number: [id number] Patient Account Number: [id number] Date of Birth/Sex: 05-02-1965 (55 y.o. F) Treating RN: Rogers Blocker Primary Care Arthur Speagle: SYSTEM, PCP Other Clinician: Referring Caedyn Tassinari: Willy Eddy Treating Afsheen Antony/Extender: [name] in Treatment: 6 Visit Information History Since Last Visit Has Dressing in Place as Prescribed: Yes Patient Arrived: Wheel Chair Pain Present Now: Yes Arrival Time: 10:29 Accompanied By: caregiver Transfer Assistance: None Patient Identification Verified: Yes Secondary Verification Process Completed: Yes Patient Has Alerts: Yes Patient Alerts: Type II Diabetic Electronic Signature(s) Signed: 11/01/2020 4:52:35 PM By: Rogers Blocker RN Entered By: Rogers Blocker on 11/01/2020 10:29:58 Merica, [name] ([id number]) -------------------------------------------------------------------------------- Clinic Level of Care Assessment Details Patient Name: Keinath, Yunuen R. Date of Service: 11/01/2020 10:00 AM Medical Record Number: [id number] Patient Account Number: [id number] Date of Birth/Sex: 12-10-1965 (55 y.o. F) Treating RN: Rogers Blocker Primary Care Lewellyn Fultz: SYSTEM, PCP Other Clinician: Referring Anacarolina Evelyn: Willy Eddy Treating Jessamy Torosyan/Extender: [name] in Treatment: 6 Clinic Level of Care Assessment Items TOOL 1 Quantity Score []  - Use when EandM and Procedure is performed on INITIAL visit 0 ASSESSMENTS - Nursing Assessment / Reassessment []  - General Physical Exam (combine w/ comprehensive assessment (listed just below) when performed on new 0 pt. evals) []  - 0 Comprehensive Assessment (HX, ROS, Risk Assessments, Wounds Hx, etc.) ASSESSMENTS - Wound and Skin Assessment / Reassessment []  - Dermatologic / Skin Assessment (not  related to wound area) 0 ASSESSMENTS - Ostomy and/or Continence Assessment and Care []  - Incontinence Assessment and Management 0 []  - 0 Ostomy Care Assessment and Management (repouching, etc.) PROCESS - Coordination of Care []  - Simple Patient / Family Education for ongoing care 0 []  - 0 Complex (extensive) Patient / Family Education for ongoing care []  - 0 Staff obtains , Records, Test Results / Process Orders []  - 0 Staff telephones HHA, Nursing Homes / Clarify orders / etc []  - 0 Routine Transfer to another Facility (non-emergent condition) []  - 0 Routine Hospital Admission (non-emergent condition) []  - 0 New Admissions / / Ordering NPWT, Apligraf, etc. []  - 0 Emergency Hospital Admission (emergent condition) PROCESS - Special Needs []  - Pediatric / Minor Patient Management 0 []  - 0 Isolation Patient Management []  - 0 Hearing / Language / Visual special needs []  - 0 Assessment of Community assistance (transportation, D/C planning, etc.) []  - 0 Additional assistance / Altered mentation []  - 0 Support Surface(s) Assessment (bed, cushion, seat, etc.) INTERVENTIONS - Miscellaneous []  - External ear exam 0 []  - 0 Patient Transfer (multiple staff / / Similar devices) []  - 0 Simple Staple / Suture removal (25 or less) []  - 0 Complex Staple / Suture removal (26 or more) []  - 0 Hypo/Hyperglycemic Management (do not check if billed separately) []  - 0 Ankle / Brachial Index (ABI) - do not check if billed separately Has the patient been seen at the hospital within the last three years: Yes Total Score: 0 Level Of Care: ____ ( ) Electronic Signature(s) Signed: 11/01/2020 4:52:35 PM By: RN Entered By: on 11/01/2020 11:15:53 Kolbe, Chiropractor ( ) -------------------------------------------------------------------------------- Compression Therapy Details Patient  Name: Pischke, Aila R. Date of Service: 11/01/2020 10:00 AM Medical Record Number: Patient Account Number: Date of Birth/Sex: 1965-12-06 (55 y.o. F) Treating RN: Primary Care Shaylah Mcghie: SYSTEM, PCP  Other Clinician: Referring Lorelei Heikkila: Willy EddyOBINSON, PATRICK Treating Georgeanna Radziewicz/Extender: Rowan BlaseStone, Hoyt Weeks in Treatment: 6 Compression Therapy Performed for Wound Assessment: Wound #1 Left,Midline,Anterior Lower Leg Performed By: Clinician Rogers BlockerSanchez, Kenia, RN Compression Type: Three Layer Pre Treatment ABI: 1.2 Post Procedure Diagnosis Same as Pre-procedure Electronic Signature(s) Signed: 11/01/2020 4:52:35 PM By: Rogers BlockerSanchez , Kenia RN Entered By: Rogers BlockerSanchez , Kenia on 11/01/2020 11:06:19 Vint, Christie BeckersJENNIFER R. ([id number]) -------------------------------------------------------------------------------- Encounter Discharge Information Details Patient Name: Inoue, Caralina R. Date of Service: 11/01/2020 10:00 AM Medical Record Number: [id number] Patient Account Number: [id number] Date of Birth/Sex: April 26, 1965 (55 y.o. F) Treating RN: Rogers BlockerSanchez, Kenia Primary Care Genette Huertas: SYSTEM, PCP Other Clinician: Referring Cheikh Bramble: Willy EddyOBINSON, PATRICK Treating Damira Kem/Extender: Rowan BlaseStone, Hoyt Weeks in Treatment: 6 Encounter Discharge Information Items Discharge Condition: Stable Ambulatory Status: Wheelchair Discharge Destination: Other (Note Required) Orders Sent: Yes Transportation: Private Auto Accompanied By: caregiver Schedule Follow-up Appointment: Yes Clinical Summary of Care: Electronic Signature(s) Signed: 11/01/2020 4:46:59 PM By: Rogers BlockerSanchez , Kenia RN Entered By: Rogers BlockerSanchez , Kenia on 11/01/2020 16:46:59 Gleed, Christie BeckersJENNIFER R. ([id number]) -------------------------------------------------------------------------------- Lower Extremity Assessment Details Patient Name: Ladd, Batsheva R. Date of Service: 11/01/2020 10:00 AM Medical Record Number: [id number] Patient Account  Number: [id number] Date of Birth/Sex: April 26, 1965 (55 y.o. F) Treating RN: Rogers BlockerSanchez, Kenia Primary Care Axl Rodino: SYSTEM, PCP Other Clinician: Referring Sarkis Rhines: Willy EddyOBINSON, PATRICK Treating Eltha Tingley/Extender: Rowan BlaseStone, Hoyt Weeks in Treatment: 6 Edema Assessment Assessed: [Left: Yes] [Right: No] Edema: [Left: Ye] [Right: s] Calf Left: Right: Point of Measurement: 29 cm From Medial Instep 30.5 cm Ankle Left: Right: Point of Measurement: 11 cm From Medial Instep 23.5 cm Vascular Assessment Pulses: Dorsalis Pedis Palpable: [Left:Yes] Electronic Signature(s) Signed: 11/01/2020 4:52:35 PM By: Rogers BlockerSanchez , Kenia RN Entered By: Rogers BlockerSanchez , Kenia on 11/01/2020 10:38:48 Druckenmiller, Christie BeckersJENNIFER R. ([id number]) -------------------------------------------------------------------------------- Multi Wound Chart Details Patient Name: Gronewold, Natahsa R. Date of Service: 11/01/2020 10:00 AM Medical Record Number: [id number] Patient Account Number: [id number] Date of Birth/Sex: April 26, 1965 (55 y.o. F) Treating RN: Rogers BlockerSanchez, Kenia Primary Care Vernard Gram: SYSTEM, PCP Other Clinician: Referring Unnamed Zeien: Willy EddyOBINSON, PATRICK Treating Tanzie Rothschild/Extender: Rowan BlaseStone, Hoyt Weeks in Treatment: 6 Vital Signs Height(in): 68 Pulse(bpm): 69 Weight(lbs): 216 Blood Pressure(mmHg): 148/87 Body Mass Index(BMI): 33 Temperature(F): 99.1 Respiratory Rate(breaths/min): 18 Photos: [N/A:N/A] Wound Location: Left, Midline, Anterior Lower Leg N/A N/A Wounding Event: Trauma N/A N/A Primary Etiology: Trauma, Other N/A N/A Comorbid History: Anemia, Asthma, Chronic N/A N/A Obstructive Pulmonary Disease (COPD), Type II Diabetes, Osteoarthritis Date Acquired: 08/30/2020 N/A N/A Weeks of Treatment: 6 N/A N/A Wound Status: Open N/A N/A Measurements L x W x D (cm) 2x1x0.1 N/A N/A Area (cm) : 1.571 N/A N/A Volume (cm) : 0.157 N/A N/A % Reduction in Area: 90.40% N/A N/A % Reduction in Volume: 96.80% N/A N/A Classification: Full Thickness  Without Exposed N/A N/A Support Structures Exudate Amount: Medium N/A N/A Exudate Type: Serosanguineous N/A N/A Exudate Color: red, brown N/A N/A Wound Margin: Flat and Intact N/A N/A Granulation Amount: Large (67-100%) N/A N/A Granulation Quality: Red, Friable N/A N/A Necrotic Amount: None Present (0%) N/A N/A Exposed Structures: Fat Layer (Subcutaneous Tissue): N/A N/A Yes Fascia: No Tendon: No Muscle: No Joint: No Bone: No Epithelialization: Large (67-100%) N/A N/A Treatment Notes Electronic Signature(s) Signed: 11/01/2020 4:52:35 PM By: Rogers BlockerSanchez , Kenia RN Entered By: Rogers BlockerSanchez , Kenia on 11/01/2020 11:06:03 Salamone, Christie BeckersJENNIFER R. ([id number]) -------------------------------------------------------------------------------- Multi-Disciplinary Care Plan Details Patient Name: Lindy, Tarahji R. Date of Service: 11/01/2020 10:00 AM Medical Record Number: [id number] Patient Account Number: [id number] Date of Birth/Sex: April 26, 1965 (55 y.o. F) Treating RN: Rogers BlockerSanchez, Kenia Primary  Care Duvall Comes: SYSTEM, PCP Other Clinician: Referring Seda Kronberg: Willy Eddy Treating Chanique Duca/Extender: [name] in Treatment: 6 Active Inactive Abuse / Safety / Falls / Self Care Management Nursing Diagnoses: History of Falls Potential for falls Goals: Patient will not experience any injury related to falls Date Initiated: 09/20/2020 Target Resolution Date: 10/20/2020 Goal Status: Active Interventions: Podiatry chair, stretcher in low position and side rails up as needed Provide education on safe transfers Notes: Medication Nursing Diagnoses: Knowledge deficit related to medication safety: actual or potential Goals: Patient/caregiver will demonstrate understanding of all current medications Date Initiated: 09/20/2020 Target Resolution Date: 10/20/2020 Goal Status: Active Patient/caregiver will demonstrate understanding of new oral/IV medications prescribed at the [facility] (topical  prescriptions are covered under the skin breakdown problem) Date Initiated: 09/20/2020 Target Resolution Date: 10/20/2020 Goal Status: Active Interventions: Assess for medication contraindications each visit where new medications are prescribed Patient/Caregiver given reconciled medication list upon admission, changes in medications and discharge from the Wound Center Notes: Pain, Acute or Chronic Nursing Diagnoses: Pain Management - Non-cyclic Chronic Pain Pain, acute or chronic: actual or potential Potential alteration in comfort, pain Goals: Patient will verbalize adequate pain control and receive pain control interventions during procedures as needed Date Initiated: 09/20/2020 Target Resolution Date: 10/20/2020 Goal Status: Active Interventions: Assess comfort goal upon admission Treatment Activities: Administer pain control measures as ordered : 09/20/2020 Lynes, Naudia R. ([id number]) Notes: Soft Tissue Infection Nursing Diagnoses: Impaired tissue integrity Knowledge deficit related to disease process and management Knowledge deficit related to home infection control: handwashing, handling of soiled dressings, supply storage Goals: Patient will remain free of wound infection Date Initiated: 09/20/2020 Target Resolution Date: 10/20/2020 Goal Status: Active Patient/caregiver will verbalize understanding of or measures to prevent infection and contamination in the home setting Date Initiated: 09/20/2020 Target Resolution Date: 10/20/2020 Goal Status: Active Signs and symptoms of infection will be recognized early to allow for prompt treatment Date Initiated: 09/20/2020 Target Resolution Date: 10/20/2020 Goal Status: Active Interventions: Assess signs and symptoms of infection every visit Provide education on infection Notes: Venous Leg Ulcer Nursing Diagnoses: Actual venous Insuffiency (use after diagnosis is confirmed) Knowledge deficit related to disease process and  management Potential for venous Insuffiency (use before diagnosis confirmed) Goals: Patient will maintain optimal edema control Date Initiated: 09/20/2020 Target Resolution Date: 10/20/2020 Goal Status: Active Interventions: Provide education on venous insufficiency Notes: Electronic Signature(s) Signed: 11/01/2020 4:52:35 PM By: Rogers Blocker RN Entered By: Rogers Blocker on 11/01/2020 10:40:27 Sease, [name] ([id number]) -------------------------------------------------------------------------------- Pain Assessment Details Patient Name: Single, Marysa R. Date of Service: 11/01/2020 10:00 AM Medical Record Number: [id number] Patient Account Number: [id number] Date of Birth/Sex: 10/05/1965 (55 y.o. F) Treating RN: Rogers Blocker Primary Care Kodah Maret: SYSTEM, PCP Other Clinician: Referring Concetta Guion: Willy Eddy Treating Caroline Matters/Extender: [name] in Treatment: 6 Active Problems Location of Pain Severity and Description of Pain Patient Has Paino Yes Site Locations Pain Location: Generalized Pain Rate the pain. Current Pain Level: 10 Pain Management and Medication Current Pain Management: Electronic Signature(s) Signed: 11/01/2020 4:52:35 PM By: Rogers Blocker RN Entered By: Rogers Blocker on 11/01/2020 10:31:30 Silvio, [name] ([id number]) -------------------------------------------------------------------------------- Patient/Caregiver Education Details Patient Name: Mccaul, Marvene R. Date of Service: 11/01/2020 10:00 AM Medical Record Number: [id number] Patient Account Number: [id number] Date of Birth/Gender: 05-Dec-1965 (55 y.o. F) Treating RN: Rogers Blocker Primary Care Physician: SYSTEM, PCP Other Clinician: Referring Physician: Willy Eddy Treating Physician/Extender: [name] in Treatment: 6 Education Assessment Education Provided To: Patient Education Topics Provided  Wound/Skin Impairment: Methods:  Explain/Verbal Responses: State content correctly Electronic Signature(s) Signed: 11/01/2020 4:52:35 PM By: Rogers Blocker RN Entered By: Rogers Blocker on 11/01/2020 11:16:09 Augsburger, [name] ([id number]) -------------------------------------------------------------------------------- Wound Assessment Details Patient Name: Millington, Tyjae R. Date of Service: 11/01/2020 10:00 AM Medical Record Number: [id number] Patient Account Number: [id number] Date of Birth/Sex: 1965-04-16 (55 y.o. F) Treating RN: Rogers Blocker Primary Care Bernedette Auston: SYSTEM, PCP Other Clinician: Referring D'Arcy Abraha: Willy Eddy Treating Morningstar Toft/Extender: [name] in Treatment: 6 Wound Status Wound Number: 1 Primary Trauma, Other Etiology: Wound Location: Left, Midline, Anterior Lower Leg Wound Open Wounding Event: Trauma Status: Date Acquired: 08/30/2020 Comorbid Anemia, Asthma, Chronic Obstructive Pulmonary Disease Weeks Of Treatment: 6 History: (COPD), Type II Diabetes, Osteoarthritis Clustered Wound: No Photos Wound Measurements Length: (cm) 2 Width: (cm) 1 Depth: (cm) 0.1 Area: (cm) 1.571 Volume: (cm) 0.157 % Reduction in Area: 90.4% % Reduction in Volume: 96.8% Epithelialization: Large (67-100%) Tunneling: No Undermining: No Wound Description Classification: Full Thickness Without Exposed Support Structu Wound Margin: Flat and Intact Exudate Amount: Medium Exudate Type: Serosanguineous Exudate Color: red, brown res Foul Odor After Cleansing: No Slough/Fibrino No Wound Bed Granulation Amount: Large (67-100%) Exposed Structure Granulation Quality: Red, Friable Fascia Exposed: No Necrotic Amount: None Present (0%) Fat Layer (Subcutaneous Tissue) Exposed: Yes Tendon Exposed: No Muscle Exposed: No Joint Exposed: No Bone Exposed: No Treatment Notes Wound #1 (Lower Leg) Wound Laterality: Left, Midline, Anterior Cleanser Soap and Water Discharge Instruction: Gently  cleanse wound with antibacterial soap, rinse and pat dry prior to dressing wounds Peri-Wound Care Tomkiewicz, Blondine R. ([id number]) Topical Primary Dressing Hydrofera Blue Ready Transfer Foam, 2.5x2.5 (in/in) Discharge Instruction: Apply Hydrofera Blue Ready to wound bed as directed-cut hydrofera big enough to cover wound bed AND catch drainage. IF USING HYDROFERA CLASSIC, PLASTIC SIDE DOES NOT TOUCH WOUND BED. Please call office for clarification if needed. Secondary Dressing ABD Pad 5x9 (in/in) Discharge Instruction: Cover with ABD pad Secured With Compression Wrap Profore Lite LF 3 Multilayer Compression Bandaging System Discharge Instruction: Apply 3 multi-layer wrap as prescribed. Compression Stockings Add-Ons Electronic Signature(s) Signed: 11/01/2020 4:52:35 PM By: Rogers Blocker RN Entered By: Rogers Blocker on 11/01/2020 10:37:49 Bewley, [name] ([id number]) -------------------------------------------------------------------------------- Vitals Details Patient Name: Frankum, Adaleen R. Date of Service: 11/01/2020 10:00 AM Medical Record Number: [id number] Patient Account Number: [id number] Date of Birth/Sex: 16-Aug-1965 (55 y.o. F) Treating RN: Rogers Blocker Primary Care Rosalynd Mcwright: SYSTEM, PCP Other Clinician: Referring Queenie Aufiero: Willy Eddy Treating Lavon Bothwell/Extender: [name] in Treatment: 6 Vital Signs Time Taken: 10:30 Temperature (F): 99.1 Height (in): 68 Pulse (bpm): 69 Weight (lbs): 216 Respiratory Rate (breaths/min): 18 Body Mass Index (BMI): 32.8 Blood Pressure (mmHg): 148/87 Reference Range: 80 - 120 mg / dl Electronic Signature(s) Signed: 11/01/2020 4:52:35 PM By: Rogers Blocker RN Entered By: Rogers Blocker on 11/01/2020 10:31:07

## 2020-11-02 ENCOUNTER — Ambulatory Visit: Payer: Medicaid Other | Admitting: Physician Assistant

## 2020-11-12 ENCOUNTER — Emergency Department: Payer: Medicaid Other

## 2020-11-12 ENCOUNTER — Emergency Department
Admission: EM | Admit: 2020-11-12 | Discharge: 2020-11-12 | Disposition: A | Discharge status: Home / Self Care | Payer: Medicaid Other | Attending: Emergency Medicine | Admitting: Emergency Medicine

## 2020-11-12 ENCOUNTER — Other Ambulatory Visit: Payer: Self-pay

## 2020-11-12 DIAGNOSIS — J449 Chronic obstructive pulmonary disease, unspecified: Secondary | ICD-10-CM | POA: Diagnosis not present

## 2020-11-12 DIAGNOSIS — N189 Chronic kidney disease, unspecified: Secondary | ICD-10-CM | POA: Diagnosis not present

## 2020-11-12 DIAGNOSIS — M79605 Pain in left leg: Secondary | ICD-10-CM

## 2020-11-12 DIAGNOSIS — Z7951 Long term (current) use of inhaled steroids: Secondary | ICD-10-CM | POA: Diagnosis not present

## 2020-11-12 DIAGNOSIS — I129 Hypertensive chronic kidney disease with stage 1 through stage 4 chronic kidney disease, or unspecified chronic kidney disease: Secondary | ICD-10-CM | POA: Insufficient documentation

## 2020-11-12 DIAGNOSIS — E119 Type 2 diabetes mellitus without complications: Secondary | ICD-10-CM | POA: Diagnosis not present

## 2020-11-12 DIAGNOSIS — Z794 Long term (current) use of insulin: Secondary | ICD-10-CM | POA: Diagnosis not present

## 2020-11-12 DIAGNOSIS — Z79899 Other long term (current) drug therapy: Secondary | ICD-10-CM | POA: Diagnosis not present

## 2020-11-12 DIAGNOSIS — I83029 Varicose veins of left lower extremity with ulcer of unspecified site: Secondary | ICD-10-CM | POA: Diagnosis not present

## 2020-11-12 DIAGNOSIS — L97929 Non-pressure chronic ulcer of unspecified part of left lower leg with unspecified severity: Secondary | ICD-10-CM | POA: Diagnosis not present

## 2020-11-12 DIAGNOSIS — Z96651 Presence of right artificial knee joint: Secondary | ICD-10-CM | POA: Insufficient documentation

## 2020-11-12 DIAGNOSIS — F17210 Nicotine dependence, cigarettes, uncomplicated: Secondary | ICD-10-CM | POA: Diagnosis not present

## 2020-11-12 LAB — CBC WITH DIFFERENTIAL/PLATELET
Abs Immature Granulocytes: 0.01 10*3/uL (ref 0.00–0.07)
Basophils Absolute: 0 10*3/uL (ref 0.0–0.1)
Basophils Relative: 1 %
Eosinophils Absolute: 0.1 10*3/uL (ref 0.0–0.5)
Eosinophils Relative: 2 %
HCT: 29.6 % — ABNORMAL LOW (ref 36.0–46.0)
Hemoglobin: 10 g/dL — ABNORMAL LOW (ref 12.0–15.0)
Immature Granulocytes: 0 %
Lymphocytes Relative: 25 %
Lymphs Abs: 1.8 10*3/uL (ref 0.7–4.0)
MCH: 32.3 pg (ref 26.0–34.0)
MCHC: 33.8 g/dL (ref 30.0–36.0)
MCV: 95.5 fL (ref 80.0–100.0)
Monocytes Absolute: 0.7 10*3/uL (ref 0.1–1.0)
Monocytes Relative: 9 %
Neutro Abs: 4.6 10*3/uL (ref 1.7–7.7)
Neutrophils Relative %: 63 %
Platelets: 276 10*3/uL (ref 150–400)
RBC: 3.1 MIL/uL — ABNORMAL LOW (ref 3.87–5.11)
RDW: 13.4 % (ref 11.5–15.5)
WBC: 7.2 10*3/uL (ref 4.0–10.5)
nRBC: 0 % (ref 0.0–0.2)

## 2020-11-12 LAB — BASIC METABOLIC PANEL
Anion gap: 8 (ref 5–15)
BUN: 35 mg/dL — ABNORMAL HIGH (ref 6–20)
CO2: 22 mmol/L (ref 22–32)
Calcium: 9.1 mg/dL (ref 8.9–10.3)
Chloride: 108 mmol/L (ref 98–111)
Creatinine, Ser: 1.64 mg/dL — ABNORMAL HIGH (ref 0.44–1.00)
GFR, Estimated: 37 mL/min — ABNORMAL LOW (ref >60)
Glucose, Bld: 131 mg/dL — ABNORMAL HIGH (ref 70–99)
Potassium: 4.6 mmol/L (ref 3.5–5.1)
Sodium: 138 mmol/L (ref 135–145)

## 2020-11-12 MED ORDER — ACETAMINOPHEN 500 MG PO TABS
1000.0000 mg | ORAL_TABLET | Freq: Once | ORAL | Status: AC
  Administered 2020-11-12: 1000 mg via ORAL
  Filled 2020-11-12: qty 2

## 2020-11-12 MED ORDER — OXYCODONE HCL 5 MG PO TABS
2.5000 mg | ORAL_TABLET | Freq: Once | ORAL | Status: AC
  Administered 2020-11-12: 2.5 mg via ORAL
  Filled 2020-11-12: qty 1

## 2020-11-12 NOTE — Discharge Instructions (Addendum)
Patient's x-ray of her leg was reassuring.  She was moving her leg all around in the bed although was being resistant to standing up with the walker.  At this time we will just continue to monitor it.  There is no signs of infection at this time but if she continues to not bear weight or develops a fever or redness or any other concerns she can was be brought back for repeat evaluation

## 2020-11-12 NOTE — ED Notes (Signed)
MD contacted legal guardian

## 2020-11-12 NOTE — ED Provider Notes (Signed)
[facility] Emergency Department Provider Note  ____________________________________________   Event Date/Time   First MD Initiated Contact with Patient 11/12/20 [phone]     (approximate)  I have reviewed the triage vital signs and the nursing notes.   HISTORY  Chief Complaint Foot Pain    HPI [name] [name] is a 55 y.o. female who comes in from [place] for left leg pain.  Patient reports having chronic pain in her left upper leg secondary to 2 small ulcers.  This pain is been constant, she is not sure which she takes to help with the pain states that it is worse with trying to walk.  States that today she tried to get up and walk with her walker and that she had her same typical pain near the ulcers in the left leg.  Denies any pain in the lower leg, swelling or other concerns.  Patient states that because of the pain she lowered herself down to the ground.  She did not fall did not hit her head.  She denies any other injuries.  On review of records patient was seen on 9/5 for the same and they attempted to do an x-ray but patient was unable to lay flat on the table.  Did not feel like it was septic in nature and the wounds were redressed.  She was then seen on 9/12 by wound clinic and there was no signs of an active infection at this time. Hydrofera Blue.  Patient was also seen on 8/12 for continued leg pain and on review of this picture the wound was much larger than it is today.  Patient had an ultrasound that was negative.  She was also seen on 8/3 and had an x-ray done at that time without any evidence of fractures.  Again at this time there is no evidence of infection therefore patient was placed on Doxy and discharged.            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   Acute lower UTI 01/04/2020   Pressure injury of skin 01/03/2020   Dysphagia 01/03/2020   Hypocalcemia 01/03/2020   Anemia in chronic kidney disease (CKD) 01/03/2020   Sepsis (HCC) 01/02/2020   Type 2 diabetes mellitus with hypoglycemia without coma (HCC) 01/02/2020   AKI (acute kidney injury) (HCC) 01/01/2020   Hyperlipidemia    Essential hypertension    Depression with anxiety    Chronic obstructive pulmonary disease (HCC)    Tobacco abuse 10/21/2019   Sensory disturbance 10/21/2019   TIA (transient ischemic attack) 10/20/2019   Altered mental status    Loss of consciousness (HCC) 09/08/2018   Encephalopathy 09/08/2018   S/P TKR (total knee replacement) using cement, 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   Anxiety 04/02/2017   Urinary incontinence 01/25/2016   Fecal incontinence 01/25/2016   Dysthymia 01/25/2016    Past Surgical History:  Procedure Laterality Date   COLONOSCOPY WITH PROPOFOL N/A 11/27/2018   Procedure: COLONOSCOPY WITH PROPOFOL;  Surgeon: Winston,  Boykin Nearing, MD;  Location: [facility];  Service: Gastroenterology;  Laterality: N/A;   ESOPHAGOGASTRODUODENOSCOPY (EGD) WITH PROPOFOL N/A 11/27/2018   Procedure: ESOPHAGOGASTRODUODENOSCOPY (EGD) WITH PROPOFOL;  Surgeon: Toledo, Boykin Nearing, MD;  Location: [facility];  Service: Gastroenterology;  Laterality: N/A;   NO PAST SURGERIES     TOTAL KNEE ARTHROPLASTY Right 10/03/2017   Procedure: TOTAL KNEE ARTHROPLASTY;  Surgeon: Lyndle Herrlich, MD;  Location: [facility];  Service: Orthopedics;  Laterality: Right;    Prior to Admission medications   Medication Sig Start Date End Date Taking? Authorizing Provider  acetaminophen (TYLENOL) 650 MG CR tablet Take 650 mg by mouth every 8 (eight) hours as needed for pain.    [provider]   albuterol (PROVENTIL HFA;VENTOLIN HFA) 108 (90 Base) MCG/ACT inhaler Inhale 2 puffs into the lungs every 4 (four) hours as needed for wheezing or shortness of breath.    [provider]  amLODipine (NORVASC) 5 MG tablet Take 1 tablet (5 mg total) by mouth every morning. 01/08/20 02/07/20  Sherryll Burger, Pratik D, DO  atorvastatin (LIPITOR) 40 MG tablet Take 40 mg by mouth at bedtime. (2000)    [provider]  busPIRone (BUSPAR) 15 MG tablet Take 1 tablet (15 mg total) by mouth 3 (three) times daily. 02/19/20   Aldean Baker, NP  cholecalciferol (VITAMIN D) 25 MCG (1000 UNIT) tablet Take 1 tablet (1,000 Units total) by mouth daily. (0800) Patient not taking: Reported on 01/01/2020 10/22/19   Vassie Loll, MD  cholecalciferol (VITAMIN D3) 25 MCG (1000 UT) tablet Take 1,000 Units by mouth every morning.    [provider]  clopidogrel (PLAVIX) 75 MG tablet Take 1 tablet (75 mg total) by mouth daily with breakfast. 10/23/19   Vassie Loll, MD  Dexlansoprazole 30 MG capsule Take 30 mg by mouth daily.    [provider]  diclofenac sodium (VOLTAREN) 1 % GEL Apply 2 g topically 4 (four) times daily as needed. Patient taking differently: Apply 2 g topically in the morning and at bedtime. 09/13/18   Briant Cedar, MD  docusate sodium (COLACE) 100 MG capsule Take 1 capsule (100 mg total) by mouth 2 (two) times daily. Patient not taking: Reported on 01/01/2020 10/22/19   Vassie Loll, MD  ferrous sulfate 325 (65 FE) MG tablet Take 1 tablet (325 mg total) by mouth 2 (two) times daily with a meal. Patient taking differently: Take 325 mg by mouth daily with breakfast. 09/13/18 01/01/20  Briant Cedar, MD  Fluticasone-Salmeterol (ADVAIR) 100-50 MCG/DOSE AEPB Inhale 1 puff into the lungs 2 (two) times daily.    [provider]  folic acid (FOLVITE) 1 MG tablet Take 1 mg by mouth daily.    [provider]  furosemide (LASIX) 20 MG tablet Take 1 tablet (20  mg total) by mouth 2 (two) times daily. (0800) Patient taking differently: Take 30 mg by mouth 2 (two) times daily. (0800) 10/22/19   Vassie Loll, MD  guaiFENesin (ROBITUSSIN) 100 MG/5ML SOLN Take 5 mLs by mouth 3 (three) times daily.    [provider]  HYDROcodone-acetaminophen (NORCO/VICODIN) 5-325 MG tablet Take 1 tablet by mouth every 4 (four) hours as needed for severe pain. 09/22/20 09/22/21  Shaune Pollack, MD  LANTUS SOLOSTAR 100 UNIT/ML Solostar Pen Inject 12 Units into the skin at bedtime. 01/13/20   [provider]  LORazepam (ATIVAN) 1 MG tablet Take 1 mg by mouth 2 (two) times daily as needed for anxiety. 02/09/20   [provider]  megestrol (MEGACE) 40 MG tablet Take 40 mg by mouth daily.    [provider]  nicotine (NICODERM CQ - DOSED IN MG/24 HOURS) 21 mg/24hr patch Place 1 patch (21 mg total) onto the skin daily. Patient not taking: No sig reported 10/23/19   Vassie Loll, MD  pantoprazole (PROTONIX) 40 MG tablet Take 1 tablet (40 mg total) by mouth daily. 09/14/18 10/20/19  Briant Cedar, MD  sodium bicarbonate 650 MG tablet Take 650 mg by mouth daily. 02/17/20   [provider]  traMADol (ULTRAM) 50 MG tablet Take 50 mg by mouth 4 (four) times daily.  09/30/19   [provider]  traZODone (DESYREL) 100 MG tablet Take 100 mg by mouth at bedtime.    [provider]  vitamin B-12 (CYANOCOBALAMIN) 1000 MCG tablet Take 1,000 mcg by mouth daily.    [provider]  zolpidem (AMBIEN) 10 MG tablet Take 10 mg by mouth at bedtime as needed. 02/09/20   [provider]    Allergies Latex  Family History  Problem Relation Age of Onset   Heart disease Mother    Heart disease Father     Social History Social History   Tobacco Use   Smoking status: Every Day    Types: Cigarettes   Smokeless tobacco: Never  Vaping Use   Vaping Use: Never used  Substance Use Topics   Alcohol use: No   Drug  use: No      Review of Systems Constitutional: No fever/chills Eyes: No visual changes. ENT: No sore throat. Cardiovascular: Denies chest pain. Respiratory: Denies shortness of breath. Gastrointestinal: No abdominal pain.  No nausea, no vomiting.  No diarrhea.  No constipation. Genitourinary: Negative for dysuria. Musculoskeletal: Negative for back pain.  Left leg pain Skin: Negative for rash. Neurological: Negative for headaches, focal weakness or numbness. All other ROS negative ____________________________________________   PHYSICAL EXAM:  VITAL SIGNS: ED Triage Vitals  Enc Vitals Group     BP 11/12/20 0714 (!) 150/88     Pulse Rate 11/12/20 0714 87     Resp 11/12/20 0714 18     Temp 11/12/20 0714 98.5 F (36.9 C)     Temp Source 11/12/20 0714 Oral     SpO2 11/12/20 0713 98 %     Weight 11/12/20 0715 215 lb 13.3 oz (97.9 kg)     Height 11/12/20 0715 5\' 8"  (1.727 m)     Head Circumference --      Peak Flow --      Pain Score 11/12/20 0714 10     Pain Loc --      Pain Edu? --      Excl. in GC? --     Constitutional: Alert and oriented. Well appearing and in no acute distress. Eyes: Conjunctivae are normal. EOMI. Head: Atraumatic. Nose: No congestion/rhinnorhea. Mouth/Throat: Mucous membranes are moist.   Neck: No stridor. Trachea Midline. FROM Cardiovascular: Normal rate, regular rhythm. Grossly normal heart sounds.  Good peripheral circulation. Respiratory: Normal respiratory effort.  No retractions. Lungs CTAB. Gastrointestinal: Soft and nontender. No distention. No abdominal bruits.  Musculoskeletal: Wrapping was taken down from the left leg and patient has very small to ulcers noted to the anterior portion of the upper tib-fib area with granulation tissue forming without any erythema redness, drainage.  2+ distal pulse, warm and well-perfused.  Able to pick up the leg.  No swelling noted.  No pain in the calf.  No pain at  the hip, no pain at knee, no pain at  ankle, no pain at foot. Only pain when push directly on the ulcers.  Neurologic:  Normal speech and language. No gross focal neurologic deficits are appreciated.  Skin:  Skin is warm, dry and intact. No rash noted. Psychiatric: Mood and affect are normal. Speech and behavior are normal. GU: Deferred   ____________________________________________   INITIAL IMPRESSION / ASSESSMENT AND PLAN / ED COURSE  [name] [name] was evaluated in Emergency Department on 11/12/2020 for the symptoms described in the history of present illness. She was evaluated in the context of the global COVID-19 pandemic, which necessitated consideration that the patient might be at risk for infection with the SARS-CoV-2 virus that causes COVID-19. Institutional protocols and algorithms that pertain to the evaluation of patients at risk for COVID-19 are in a state of rapid change based on information released by regulatory bodies including the CDC and federal and state organizations. These policies and algorithms were followed during the patient's care in the ED.     Patient comes in with chronic pain to her left leg secondary to known ulcers.  On review of records the ulcer seem to be healing very well and a lot smaller than they were a few weeks ago.  There is no evidence of infection at this time.  Does not sound like she fell onto the leg to suggest any kind of fracture.  No swelling of the leg and is already had ultrasounds that have been negative for DVT.  Has good distal pulse unlikely arterial issue we will treat patient's pain and ambulate and plan for discharge home.    I called patient's legal guardian and updated her on the plan.  8:15 AM patient refusing to stand up and bear weight onto her leg.  We will give her a little bit more time from the onset of pain medication but will also get x-rays just to make sure there are no fractures which I doubt based upon my examination but given she is refusing to bear weight  and there was concern that she might have lowered herself to the ground  9:28 AM we called the facility and they stated that she normally is able to ambulate but it was just starting today that she was saying that she could not ambulate because of the pain.  We have attempted to get her up at bedside again and she is continuing to refuse to walk.  We attempted to get some x-rays but she is moving all around so we will just get a tib-fib x-ray given that is where her pain is at.  She has no pain at the left hip and she is able to lift her legs up in the bed.  I got some basic labs given patient's refusing to walk just to make sure she was not hyponatremic with her something else that was causing some weakness.  Her hemoglobin is stable no evidence of white count to suggest infection. Labs around baseline.   10:27 AM reevaluated patient she continues to only have pain just right on the ulcer itself. When I asked her why she isnt walking she stated that she can walk  She is able to move her self up in the bed and pick both legs up off the beds and hold them up for a few seconds and moving the legs all around without any obvious pain. Not sure why she is refusing to walk but do not think there  is medical cause at this time. Called facility and they will monitor there and if worsening or continues to not ambulate can always send her back for re-assessment.  The nurse called the facility and I offered to talk to them about this plan but they declined and they felt comfortable taking patient back  I discussed the provisional nature of ED diagnosis, the treatment so far, the ongoing plan of care, follow up appointments and return precautions with the patient and any family or support people present. They expressed understanding and agreed with the plan, discharged home.            ____________________________________________   FINAL CLINICAL IMPRESSION(S) / ED DIAGNOSES   Final diagnoses:  Left leg  pain  Ulcer of left lower extremity, unspecified ulcer stage (HCC)      MEDICATIONS GIVEN DURING THIS VISIT:  Medications  acetaminophen (TYLENOL) tablet 1,000 mg (1,000 mg Oral Given 11/12/20 0734)  oxyCODONE (Oxy IR/ROXICODONE) immediate release tablet 2.5 mg (2.5 mg Oral Given 11/12/20 0735)     ED Discharge Orders     None        Note:  This document was prepared using Dragon voice recognition software and may include unintentional dictation errors.    Concha Se, MD 11/12/20 1113

## 2020-11-12 NOTE — ED Notes (Addendum)
Facility staff contacted by this RN and given report on pt. Facility staff verbalizes understanding. RN made facility aware that pt able to move all extremities but refuses to stand for this RN.

## 2020-11-12 NOTE — ED Notes (Signed)
This RN at bedside to attempt to ambulate pt with walker and RN assistance. Pt able to get to side of bed with assistance but unable to bear weight on left leg at this time. Pt placed back in bed. MD made aware.

## 2020-11-12 NOTE — ED Notes (Signed)
This RN attempted to contact legal guardian, Jon Gills, without success.

## 2020-11-12 NOTE — ED Notes (Signed)
[facility]  called  for  transport  to  [facility]

## 2020-11-12 NOTE — ED Notes (Signed)
This RN at bedside. Pt with soiled linens, brief and gown. This RN cleaned pt. Linens and brief changed. Pt refused for RN to provided clean gown at this time.

## 2020-11-12 NOTE — ED Notes (Addendum)
RN and MD at bedside to attempt to stand pt. Pt still refusing to bear weight on LLE but able to move all four extremities freely and adjust herself in bed. RN called facility to see pt baseline. Facility states pt refused to walk with walker this morning. Facility states pt has been walking fine with walker prior to today. MD made aware.

## 2020-11-12 NOTE — ED Triage Notes (Signed)
Pt to ED via [facility] from [facility]. Pt c/o increased left foot pain. Pt currently being treated by the wound clinic for LLE wound. Pt denies falls but states this morning she had to lay herself down on the floor because her left leg pain was so bad. Pt A&Ox3. Pt hx DM. CBG 104.

## 2020-11-15 ENCOUNTER — Encounter: Payer: Medicaid Other | Admitting: Internal Medicine

## 2020-11-15 ENCOUNTER — Other Ambulatory Visit: Payer: Self-pay

## 2020-11-15 DIAGNOSIS — S81812A Laceration without foreign body, left lower leg, initial encounter: Secondary | ICD-10-CM | POA: Diagnosis not present

## 2020-11-16 NOTE — Progress Notes (Signed)
[name] [name], [name] [name] ([id number]) Visit Report for 11/15/2020 Debridement Details Patient Name: [name] [name], [name] YADAO. Date of Service: 11/15/2020 10:00 AM Medical Record Number: [id number] Patient Account Number: [id number] Date of Birth/Sex: 06-06-1965 (55 y.o. F) Treating RN: Yevonne Pax Primary Care Provider: SYSTEM, PCP Other Clinician: Referring Provider: Willy Eddy Treating Provider/Extender: Altamese [place] in Treatment: 8 Debridement Performed for Wound #1 Left,Midline,Anterior Lower Leg Assessment: Performed By: Physician Maxwell Caul, MD Debridement Type: Debridement Level of Consciousness (Pre- Awake and Alert procedure): Pre-procedure Verification/Time Out Yes - 10:34 Taken: Start Time: 10:34 Pain Control: Lidocaine 4% Topical Solution Total Area Debrided (L x W): 2 (cm) x 0.5 (cm) = 1 (cm) Tissue and other material Non-Viable, Eschar, Slough, Skin: Dermis , Skin: Epidermis, Slough debrided: Level: Skin/Epidermis Debridement Description: Selective/Open Wound Instrument: Curette Bleeding: Minimum Hemostasis Achieved: Pressure End Time: 10:39 Procedural Pain: 0 Post Procedural Pain: 0 Response to Treatment: Procedure was tolerated well Level of Consciousness (Post- Awake and Alert procedure): Post Debridement Measurements of Total Wound Length: (cm) 2 Width: (cm) 0.5 Depth: (cm) 0.1 Volume: (cm) 0.079 Character of Wound/Ulcer Post Debridement: Improved Post Procedure Diagnosis Same as Pre-procedure Electronic Signature(s) Signed: 11/15/2020 4:23:07 PM By: Baltazar Najjar MD Signed: 11/16/2020 11:11:36 AM By: Yevonne Pax RN Entered By: Baltazar Najjar on 11/15/2020 10:43:37 [name] [name], [name] [name] ([id number]) -------------------------------------------------------------------------------- Debridement Details Patient Name: [name] [name], [name] Date of Service: 11/15/2020 10:00 AM Medical Record Number: [id number] Patient Account Number:  [id number] Date of Birth/Sex: Mar 15, 1965 (55 y.o. F) Treating RN: Yevonne Pax Primary Care Provider: SYSTEM, PCP Other Clinician: Referring Provider: Willy Eddy Treating Provider/Extender: Altamese [place] in Treatment: 8 Debridement Performed for Wound #2 Left,Proximal,Lateral Lower Leg Assessment: Performed By: Physician Maxwell Caul, MD Debridement Type: Debridement Severity of Tissue Pre Debridement: Fat layer exposed Level of Consciousness (Pre- Awake and Alert procedure): Pre-procedure Verification/Time Out Yes - 10:34 Taken: Start Time: 10:34 Pain Control: Lidocaine 4% Topical Solution Total Area Debrided (L x W): 1.5 (cm) x 1.8 (cm) = 2.7 (cm) Tissue and other material Non-Viable, Eschar, Slough, Skin: Dermis , Skin: Epidermis, Slough debrided: Level: Skin/Epidermis Debridement Description: Selective/Open Wound Instrument: Curette Bleeding: Minimum Hemostasis Achieved: Pressure End Time: 10:39 Procedural Pain: 0 Post Procedural Pain: 0 Response to Treatment: Procedure was tolerated well Level of Consciousness (Post- Awake and Alert procedure): Post Debridement Measurements of Total Wound Length: (cm) 1.5 Width: (cm) 1.8 Depth: (cm) 0.1 Volume: (cm) 0.212 Character of Wound/Ulcer Post Debridement: Improved Severity of Tissue Post Debridement: Fat layer exposed Post Procedure Diagnosis Same as Pre-procedure Electronic Signature(s) Signed: 11/15/2020 4:23:07 PM By: Baltazar Najjar MD Signed: 11/16/2020 11:11:36 AM By: Yevonne Pax RN Entered By: Baltazar Najjar on 11/15/2020 10:44:27 [name] [name], [name] [name] ([id number]) -------------------------------------------------------------------------------- HPI Details Patient Name: [name] [name], [name] Date of Service: 11/15/2020 10:00 AM Medical Record Number: [id number] Patient Account Number: [id number] Date of Birth/Sex: April 18, 1965 (55 y.o. F) Treating RN: Yevonne Pax Primary Care Provider:  SYSTEM, PCP Other Clinician: Referring Provider: Willy Eddy Treating Provider/Extender: Altamese [place] in Treatment: 8 History of Present Illness HPI Description: 09/20/2020 upon evaluation today patient presents for initial inspection here in our clinic concerning issues that she has been having with a trauma wound with a skin tear on the anterior portion of her left leg after getting in a encounter with another resident at the facility at the [facility]. Subsequently she is having quite a bit of pain according to what she is telling me today. There does not appear to  be any signs of active infection which is great news. With that being said I do see some signs of hypergranulation so I believe there is a little bit too much moisture going on I am not sure what dressing wound they are using as all she had on was basically just a protective dressing today nothing really more. Either way I think that she would actually do well with [facility] which is probably the best option currently based on what I am seeing. The surface otherwise looks good just a little bit hypergranular. She does have a history of seemingly dementia along with some other psychiatric conditions in general. She does have diabetes mellitus type 2 as well. 10/11/2020 upon evaluation today patient appears to be doing decently well in regard to her wound. Fortunately there does not appear to be any signs of active infection which is great news. The biggest issue is that the [facility] was put on backwards. Unfortunately this was not the ready transfer and therefore has a back which does not allow any absorption this did not do too well for the patient to be perfectly honest. Nonetheless the wound does not look worse but there is still some hypergranulation I think that silver nitrate will be helpful today. 10/19/2020 upon evaluation today patient appears to be doing well with regard to her wound. Overall I  feel like this is significantly improved which is great news. Nonetheless I feel like there is still some hypergranulation that I would like to try to trim down a little bit today with debridement and I discussed that with the patient she is in agreement with that. 11/01/2020 upon evaluation today patient appears to be doing well with regard to her wound on the leg. She has been tolerating the dressing changes without complication and fortunately there does not appear to be any signs of active infection systemically at this time which is great news. No fevers, chills, nausea, vomiting, or diarrhea. 9/26; patient has a new wound just lateral to the original wound on the upper left calf. His usual we have absolutely no history here probably an abrasion. She is supposed to have 3 layer compression and she has home health although she did not come in with any form of wrap on. Primary dressing by our records is [facility]. Electronic Signature(s) Signed: 11/15/2020 4:23:07 PM By: Baltazar Najjar MD Entered By: Baltazar Najjar on 11/15/2020 10:46:24 [name] [name], [name] [name] ([id number]) -------------------------------------------------------------------------------- Physical Exam Details Patient Name: [name] [name], [name] Date of Service: 11/15/2020 10:00 AM Medical Record Number: [id number] Patient Account Number: [id number] Date of Birth/Sex: 06/16/1965 (55 y.o. F) Treating RN: Yevonne Pax Primary Care Provider: SYSTEM, PCP Other Clinician: Referring Provider: Willy Eddy Treating Provider/Extender: Maxwell Caul We[place] in Treatment: 8 Constitutional Sitting or standing Blood Pressure is within target range for patient.. Pulse regular and within target range for patient.Marland Kitchen Respirations regular, non- labored and within target range.. Temperature is normal and within the target range for the patient.Marland Kitchen appears in no distress. Notes Wound exam; the patient now has 2 wounds 1 on the left upper  anterior and 1 just lateral to it. Both of them are superficial. Both have thick flaking skin and callus around the circumference which I removed with a #3 curette. There was minimal to no bleeding. Wounds actually look somewhat healthy. For edema control is not really that good Electronic Signature(s) Signed: 11/15/2020 4:23:07 PM By: Baltazar Najjar MD Entered By: Baltazar Najjar on 11/15/2020 10:49:31 [name] [name], [name] [name] ([id number]) --------------------------------------------------------------------------------  Physician Orders Details Patient Name: [name] [name], [name] FENNING. Date of Service: 11/15/2020 10:00 AM Medical Record Number: [id number] Patient Account Number: [id number] Date of Birth/Sex: 17-Jul-1965 (55 y.o. F) Treating RN: Yevonne Pax Primary Care Provider: SYSTEM, PCP Other Clinician: Referring Provider: Willy Eddy Treating Provider/Extender: Altamese [place] in Treatment: 8 Verbal / Phone Orders: No Diagnosis Coding Follow-up Appointments o Return Appointment in 2 weeks. - [facility]-PLEASE SCHEDULE ACCORDINGLY AS PATIENT WILL NOT BE SEEN FOR 2 WEEKS o Nurse Visit as needed - call to set up if needed Home Health o Home Health Company: - [facility] o [facility] for wound care. May utilize formulary equivalent dressing for wound treatment orders unless otherwise specified. Home Health Nurse may visit PRN to address patientos wound care needs. o Scheduled days for dressing changes to be completed; exception, patient has scheduled wound care visit that day. o **Please direct any NON-WOUND related issues/requests for orders to patient's Primary Care Physician. **If current dressing causes regression in wound condition, may D/C ordered dressing product/s and apply Normal Saline Moist Dressing daily until next Wound Healing Center or Other MD appointment. **Notify Wound Healing Center of regression in wound condition at [phone]. Bathing/  Shower/ Hygiene o May shower with wound dressing protected with water repellent cover or cast protector. - DO NOT GET COMPRESSION WRAP WET Anesthetic (Use 'Patient Medications' Section for Anesthetic Order Entry) o Lidocaine applied to wound bed Edema Control - Lymphedema / Segmental Compressive Device / Other o Optional: One layer of unna paste to top of compression wrap (to act as an anchor). Wound Treatment Wound #1 - Lower Leg Wound Laterality: Left, Midline, Anterior Cleanser: Soap and Water 2 x Per Week/30 Days Discharge Instructions: Gently cleanse wound with antibacterial soap, rinse and pat dry prior to dressing wounds Primary Dressing: Hydrofera Blue Ready Transfer Foam, 2.5x2.5 (in/in) 2 x Per Week/30 Days Discharge Instructions: Apply Hydrofera Blue Ready to wound bed as directed-cut hydrofera big enough to cover wound bed AND catch drainage. IF USING HYDROFERA CLASSIC, PLASTIC SIDE DOES NOT TOUCH WOUND BED. Please call office for clarification if needed. Secondary Dressing: ABD Pad 5x9 (in/in) 2 x Per Week/30 Days Discharge Instructions: Cover with ABD pad Compression Wrap: Profore Lite LF 3 Multilayer Compression Bandaging System 2 x Per Week/30 Days Discharge Instructions: Apply 3 multi-layer wrap as prescribed. Wound #2 - Lower Leg Wound Laterality: Left, Lateral, Proximal Cleanser: Soap and Water 2 x Per Week/30 Days Discharge Instructions: Gently cleanse wound with antibacterial soap, rinse and pat dry prior to dressing wounds Primary Dressing: Hydrofera Blue Ready Transfer Foam, 2.5x2.5 (in/in) 2 x Per Week/30 Days Discharge Instructions: Apply Hydrofera Blue Ready to wound bed as directed-cut hydrofera big enough to cover wound bed AND catch drainage. IF USING HYDROFERA CLASSIC, PLASTIC SIDE DOES NOT TOUCH WOUND BED. Please call office for clarification if needed. Secondary Dressing: ABD Pad 5x9 (in/in) 2 x Per Week/30 Days Discharge Instructions: Cover with ABD  pad Compression Wrap: Profore Lite LF 3 Multilayer Compression Bandaging System 2 x Per Week/30 Days Discharge Instructions: Apply 3 multi-layer wrap as prescribed. [name] [name], [name] [name] ([id number]) Electronic Signature(s) Signed: 11/15/2020 4:23:07 PM By: Baltazar Najjar MD Signed: 11/16/2020 11:11:36 AM By: Yevonne Pax RN Entered By: Yevonne Pax on 11/15/2020 10:42:20 Bruss, [name] [name] ([id number]) -------------------------------------------------------------------------------- Problem List Details Patient Name: Kinderman, Shaunita R. Date of Service: 11/15/2020 10:00 AM Medical Record Number: [id number] Patient Account Number: [id number] Date of Birth/Sex: [birth year] (55 y.o. F) Treating RN: Yevonne Pax Primary  Care Provider: SYSTEM, PCP Other Clinician: Referring Provider: Willy Eddy Treating Provider/Extender: Altamese [place] in Treatment: 8 Active Problems ICD-10 Encounter Code Description Active Date MDM Diagnosis S81.812A Laceration without foreign body, left lower leg, initial encounter 09/20/2020 No Yes L97.822 Non-pressure chronic ulcer of other part of left lower leg with fat layer 09/20/2020 No Yes exposed E11.622 Type 2 diabetes mellitus with other skin ulcer 09/20/2020 No Yes F01.50 Vascular dementia without behavioral disturbance 09/20/2020 No Yes Inactive Problems Resolved Problems Electronic Signature(s) Signed: 11/15/2020 4:23:07 PM By: Baltazar Najjar MD Entered By: Baltazar Najjar on 11/15/2020 10:41:58 Hoen, [name] [name] ([id number]) -------------------------------------------------------------------------------- Progress Note Details Patient Name: Schlabach, Lucillie R. Date of Service: 11/15/2020 10:00 AM Medical Record Number: [id number] Patient Account Number: [id number] Date of Birth/Sex: 1965-04-05 (55 y.o. F) Treating RN: Yevonne Pax Primary Care Provider: SYSTEM, PCP Other Clinician: Referring Provider: Willy Eddy Treating  Provider/Extender: Altamese [place] in Treatment: 8 Subjective History of Present Illness (HPI) 09/20/2020 upon evaluation today patient presents for initial inspection here in our clinic concerning issues that she has been having with a trauma wound with a skin tear on the anterior portion of her left leg after getting in a encounter with another resident at the facility at the [facility] Subsequently she is having quite a bit of pain according to what she is telling me today. There does not appear to be any signs of active infection which is great news. With that being said I do see some signs of hypergranulation so I believe there is a little bit too much moisture going on I am not sure what dressing wound they are using as all she had on was basically just a protective dressing today nothing really more. Either way I think that she would actually do well with [facility] which is probably the best option currently based on what I am seeing. The surface otherwise looks good just a little bit hypergranular. She does have a history of seemingly dementia along with some other psychiatric conditions in general. She does have diabetes mellitus type 2 as well. 10/11/2020 upon evaluation today patient appears to be doing decently well in regard to her wound. Fortunately there does not appear to be any signs of active infection which is great news. The biggest issue is that the [facility] was put on backwards. Unfortunately this was not the ready transfer and therefore has a back which does not allow any absorption this did not do too well for the patient to be perfectly honest. Nonetheless the wound does not look worse but there is still some hypergranulation I think that silver nitrate will be helpful today. 10/19/2020 upon evaluation today patient appears to be doing well with regard to her wound. Overall I feel like this is significantly improved which is great news. Nonetheless I  feel like there is still some hypergranulation that I would like to try to trim down a little bit today with debridement and I discussed that with the patient she is in agreement with that. 11/01/2020 upon evaluation today patient appears to be doing well with regard to her wound on the leg. She has been tolerating the dressing changes without complication and fortunately there does not appear to be any signs of active infection systemically at this time which is great news. No fevers, chills, nausea, vomiting, or diarrhea. 9/26; patient has a new wound just lateral to the original wound on the upper left calf. His usual we have absolutely  no history here probably an abrasion. She is supposed to have 3 layer compression and she has home health although she did not come in with any form of wrap on. Primary dressing by our records is [facility]. Objective Constitutional Sitting or standing Blood Pressure is within target range for patient.. Pulse regular and within target range for patient.Marland Kitchen Respirations regular, non- labored and within target range.. Temperature is normal and within the target range for the patient.Marland Kitchen appears in no distress. Vitals Time Taken: 10:19 AM, Height: 68 in, Weight: 216 lbs, BMI: 32.8, Temperature: 98.4 F, Pulse: 76 bpm, Respiratory Rate: 18 breaths/min, Blood Pressure: 136/81 mmHg. General Notes: Wound exam; the patient now has 2 wounds 1 on the left upper anterior and 1 just lateral to it. Both of them are superficial. Both have thick flaking skin and callus around the circumference which I removed with a #3 curette. There was minimal to no bleeding. Wounds actually look somewhat healthy. For edema control is not really that good Integumentary (Hair, Skin) Wound #1 status is Open. Original cause of wound was Trauma. The date acquired was: 08/30/2020. The wound has been in treatment 8 weeks. The wound is located on the Left,Midline,Anterior Lower Leg. The wound  measures 2cm length x 0.5cm width x 0.1cm depth; 0.785cm^2 area and 0.079cm^3 volume. There is Fat Layer (Subcutaneous Tissue) exposed. There is no tunneling or undermining noted. There is a medium amount of serosanguineous drainage noted. The wound margin is flat and intact. There is large (67-100%) red, friable granulation within the wound bed. There is no necrotic tissue within the wound bed. Wound #2 status is Open. Original cause of wound was Trauma. The date acquired was: 11/12/2020. The wound is located on the Left,Proximal,Lateral Lower Leg. The wound measures 1.5cm length x 1.8cm width x 0.1cm depth; 2.121cm^2 area and 0.212cm^3 volume. There is Fat Layer (Subcutaneous Tissue) exposed. There is no tunneling or undermining noted. There is a medium amount of serosanguineous drainage noted. There is large (67-100%) red granulation within the wound bed. Mandler, KIANNI LHEUREUX ([id number]) Assessment Active Problems ICD-10 Laceration without foreign body, left lower leg, initial encounter Non-pressure chronic ulcer of other part of left lower leg with fat layer exposed Type 2 diabetes mellitus with other skin ulcer Vascular dementia without behavioral disturbance Procedures Wound #1 Pre-procedure diagnosis of Wound #1 is a Trauma, Other located on the Left,Midline,Anterior Lower Leg . There was a Selective/Open Wound Skin/Epidermis Debridement with a total area of 1 sq cm performed by Maxwell Caul, MD. With the following instrument(s): Curette to remove Non-Viable tissue/material. Material removed includes Eschar, Slough, Skin: Dermis, and Skin: Epidermis after achieving pain control using Lidocaine 4% Topical Solution. No specimens were taken. A time out was conducted at 10:34, prior to the start of the procedure. A Minimum amount of bleeding was controlled with Pressure. The procedure was tolerated well with a pain level of 0 throughout and a pain level of 0 following the procedure. Post  Debridement Measurements: 2cm length x 0.5cm width x 0.1cm depth; 0.079cm^3 volume. Character of Wound/Ulcer Post Debridement is improved. Post procedure Diagnosis Wound #1: Same as Pre-Procedure Wound #2 Pre-procedure diagnosis of Wound #2 is a Diabetic Wound/Ulcer of the Lower Extremity located on the Left,Proximal,Lateral Lower Leg .Severity of Tissue Pre Debridement is: Fat layer exposed. There was a Selective/Open Wound Skin/Epidermis Debridement with a total area of 2.7 sq cm performed by Maxwell Caul, MD. With the following instrument(s): Curette to remove Non-Viable tissue/material. Material  removed includes Eschar, Slough, Skin: Dermis, and Skin: Epidermis after achieving pain control using Lidocaine 4% Topical Solution. No specimens were taken. A time out was conducted at 10:34, prior to the start of the procedure. A Minimum amount of bleeding was controlled with Pressure. The procedure was tolerated well with a pain level of 0 throughout and a pain level of 0 following the procedure. Post Debridement Measurements: 1.5cm length x 1.8cm width x 0.1cm depth; 0.212cm^3 volume. Character of Wound/Ulcer Post Debridement is improved. Severity of Tissue Post Debridement is: Fat layer exposed. Post procedure Diagnosis Wound #2: Same as Pre-Procedure Plan Follow-up Appointments: Return Appointment in 2 weeks. - [facility]-PLEASE SCHEDULE ACCORDINGLY AS PATIENT WILL NOT BE SEEN FOR 2 WEEKS Nurse Visit as needed - call to set up if needed Home Health: Home Health Company: - [facility] for wound care. May utilize formulary equivalent dressing for wound treatment orders unless otherwise specified. Home Health Nurse may visit PRN to address patient s wound care needs. Scheduled days for dressing changes to be completed; exception, patient has scheduled wound care visit that day. **Please direct any NON-WOUND related issues/requests for orders to patient's Primary Care  Physician. **If current dressing causes regression in wound condition, may D/C ordered dressing product/s and apply Normal Saline Moist Dressing daily until next Wound Healing Center or Other MD appointment. **Notify Wound Healing Center of regression in wound condition at [phone]. Bathing/ Shower/ Hygiene: May shower with wound dressing protected with water repellent cover or cast protector. - DO NOT GET COMPRESSION WRAP WET Anesthetic (Use 'Patient Medications' Section for Anesthetic Order Entry): Lidocaine applied to wound bed Edema Control - Lymphedema / Segmental Compressive Device / Other: Optional: One layer of unna paste to top of compression wrap (to act as an anchor). WOUND #1: - Lower Leg Wound Laterality: Left, Midline, Anterior Cleanser: Soap and Water 2 x Per Week/30 Days Discharge Instructions: Gently cleanse wound with antibacterial soap, rinse and pat dry prior to dressing wounds Primary Dressing: Hydrofera Blue Ready Transfer Foam, 2.5x2.5 (in/in) 2 x Per Week/30 Days Discharge Instructions: Apply Hydrofera Blue Ready to wound bed as directed-cut hydrofera big enough to cover wound bed AND catch drainage. IF USING HYDROFERA CLASSIC, PLASTIC SIDE DOES NOT TOUCH WOUND BED. Please call office for clarification if needed. Secondary Dressing: ABD Pad 5x9 (in/in) 2 x Per Week/30 Days Discharge Instructions: Cover with ABD pad Compression Wrap: Profore Lite LF 3 Multilayer Compression Bandaging System 2 x Per Week/30 Days Discharge Instructions: Apply 3 multi-layer wrap as prescribed. WOUND #2: - Lower Leg Wound Laterality: Left, Lateral, Proximal Cleanser: Soap and Water 2 x Per Week/30 Days Mccannon, Bellah R. ([id number]) Discharge Instructions: Gently cleanse wound with antibacterial soap, rinse and pat dry prior to dressing wounds Primary Dressing: Hydrofera Blue Ready Transfer Foam, 2.5x2.5 (in/in) 2 x Per Week/30 Days Discharge Instructions: Apply Hydrofera Blue Ready  to wound bed as directed-cut hydrofera big enough to cover wound bed AND catch drainage. IF USING HYDROFERA CLASSIC, PLASTIC SIDE DOES NOT TOUCH WOUND BED. Please call office for clarification if needed. Secondary Dressing: ABD Pad 5x9 (in/in) 2 x Per Week/30 Days Discharge Instructions: Cover with ABD pad Compression Wrap: Profore Lite LF 3 Multilayer Compression Bandaging System 2 x Per Week/30 Days Discharge Instructions: Apply 3 multi-layer wrap as prescribed. 1. Debridement of the circumference of both of these wounds 2. Still using Hydrofera Blue and I put her back in 3 layer compression 3. We have no information on  the cause of the second wound probably an abrasion 4. Back in 3 layer compression although she did not come in with that on. I wonder if there is a compliance with dressing issues here 5. We will see her back next week Electronic Signature(s) Signed: 11/15/2020 4:23:07 PM By: Baltazar Najjar MD Entered By: Baltazar Najjar on 11/15/2020 10:51:02 Steiner, [name] [name] ([id number]) -------------------------------------------------------------------------------- SuperBill Details Patient Name: League, Dajha R. Date of Service: 11/15/2020 Medical Record Number: [id number] Patient Account Number: [id number] Date of Birth/Sex: 1965-09-18 (55 y.o. F) Treating RN: Yevonne Pax Primary Care Provider: SYSTEM, PCP Other Clinician: Referring Provider: Willy Eddy Treating Provider/Extender: Altamese [place] in Treatment: 8 Diagnosis Coding ICD-10 Codes Code Description [phone] Laceration without foreign body, left lower leg, initial encounter L97.822 Non-pressure chronic ulcer of other part of left lower leg with fat layer exposed E11.622 Type 2 diabetes mellitus with other skin ulcer F01.50 Vascular dementia without behavioral disturbance Facility Procedures CPT4 Code: 05697948 Description: 97597 - DEBRIDE WOUND 1ST 20 SQ CM OR < Modifier: Quantity: 1 CPT4  Code: Description: ICD-10 Diagnosis Description L97.822 Non-pressure chronic ulcer of other part of left lower leg with fat layer Modifier: exposed Quantity: Physician Procedures CPT4 Code: [id number] Description: 97597 - WC PHYS DEBR WO ANESTH 20 SQ CM Modifier: Quantity: 1 CPT4 Code: Description: ICD-10 Diagnosis Description L97.822 Non-pressure chronic ulcer of other part of left lower leg with fat layer Modifier: exposed Quantity: Electronic Signature(s) Signed: 11/15/2020 4:23:07 PM By: Baltazar Najjar MD Entered By: Baltazar Najjar on 11/15/2020 10:51:32

## 2020-11-16 NOTE — Progress Notes (Signed)
MLISSA, TAMAYO ([id number]) Visit Report for 11/15/2020 Arrival Information Details Patient Name: [name] [name], [name] [name]. Date of Service: 11/15/2020 10:00 AM Medical Record Number: [id number] Patient Account Number: [id number] Date of Birth/Sex: 04-17-1965 (55 y.o. F) Treating RN: Yevonne Pax Primary Care Christyne Mccain: SYSTEM, PCP Other Clinician: Referring Boaz Berisha: Willy Eddy Treating Luismario Coston/Extender: Altamese [place] in Treatment: 8 Visit Information History Since Last Visit All ordered tests and consults were completed: No Patient Arrived: Dan Humphreys Added or deleted any medications: No Arrival Time: 10:11 Any new allergies or adverse reactions: No Accompanied By: caregiver Had a fall or experienced change in No Transfer Assistance: None activities of daily living that may affect Patient Identification Verified: Yes risk of falls: Secondary Verification Process Completed: Yes Signs or symptoms of abuse/neglect since last visito No Patient Requires Transmission-Based Precautions: No Hospitalized since last visit: No Patient Has Alerts: Yes Implantable device outside of the clinic excluding No Patient Alerts: Type II Diabetic cellular tissue based products placed in the center since last visit: Has Dressing in Place as Prescribed: Yes Has Compression in Place as Prescribed: Yes Pain Present Now: No Electronic Signature(s) Signed: 11/16/2020 11:11:36 AM By: Yevonne Pax RN Entered By: Yevonne Pax on 11/15/2020 10:19:17 Onley, [name] [name] ([id number]) -------------------------------------------------------------------------------- Clinic Level of Care Assessment Details Patient Name: [name] [name], [name] Date of Service: 11/15/2020 10:00 AM Medical Record Number: [id number] Patient Account Number: [id number] Date of Birth/Sex: [birth year] (55 y.o. F) Treating RN: Yevonne Pax Primary Care Tejal Monroy: SYSTEM, PCP Other Clinician: Referring Martina Brodbeck: Willy Eddy Treating Eadie Repetto/Extender: Altamese [place] in Treatment: 8 Clinic Level of Care Assessment Items TOOL 1 Quantity Score []  - Use when EandM and Procedure is performed on INITIAL visit 0 ASSESSMENTS - Nursing Assessment / Reassessment []  - General Physical Exam (combine w/ comprehensive assessment (listed just below) when performed on new 0 pt. evals) []  - 0 Comprehensive Assessment (HX, ROS, Risk Assessments, Wounds Hx, etc.) ASSESSMENTS - Wound and Skin Assessment / Reassessment []  - Dermatologic / Skin Assessment (not related to wound area) 0 ASSESSMENTS - Ostomy and/or Continence Assessment and Care []  - Incontinence Assessment and Management 0 []  - 0 Ostomy Care Assessment and Management (repouching, etc.) PROCESS - Coordination of Care []  - Simple Patient / Family Education for ongoing care 0 []  - 0 Complex (extensive) Patient / Family Education for ongoing care []  - 0 Staff obtains , Records, Test Results / Process Orders []  - 0 Staff telephones HHA, Nursing Homes / Clarify orders / etc []  - 0 Routine Transfer to another Facility (non-emergent condition) []  - 0 Routine Hospital Admission (non-emergent condition) []  - 0 New Admissions / / Ordering NPWT, Apligraf, etc. []  - 0 Emergency Hospital Admission (emergent condition) PROCESS - Special Needs []  - Pediatric / Minor Patient Management 0 []  - 0 Isolation Patient Management []  - 0 Hearing / Language / Visual special needs []  - 0 Assessment of Community assistance (transportation, D/C planning, etc.) []  - 0 Additional assistance / Altered mentation []  - 0 Support Surface(s) Assessment (bed, cushion, seat, etc.) INTERVENTIONS - Miscellaneous []  - External ear exam 0 []  - 0 Patient Transfer (multiple staff / / Similar devices) []  - 0 Simple Staple / Suture removal (25 or less) []  - 0 Complex Staple / Suture removal (26 or more) []  -  0 Hypo/Hyperglycemic Management (do not check if billed separately) []  - 0 Ankle / Brachial Index (ABI) - do not check if billed separately Has the patient been  seen at the hospital within the last three years: Yes Total Score: 0 Level Of Care: ____ NIOMIE, ENGLERT ([id number]) Electronic Signature(s) Signed: 11/16/2020 11:11:36 AM By: Yevonne Pax RN Entered By: Yevonne Pax on 11/15/2020 11:04:57 [name] [name], [name] [name] ([id number]) -------------------------------------------------------------------------------- Encounter Discharge Information Details Patient Name: [name] [name], [name] Date of Service: 11/15/2020 10:00 AM Medical Record Number: [id number] Patient Account Number: [id number] Date of Birth/Sex: [birth year] (55 y.o. F) Treating RN: Yevonne Pax Primary Care Racquel Arkin: SYSTEM, PCP Other Clinician: Referring Ivy Meriwether: Willy Eddy Treating Rowynn Mcweeney/Extender: Altamese [place] in Treatment: 8 Encounter Discharge Information Items Post Procedure Vitals Discharge Condition: Stable Temperature (F): 98.4 Ambulatory Status: Walker Pulse (bpm): 76 Discharge Destination: Home Respiratory Rate (breaths/min): 18 Transportation: Private Auto Blood Pressure (mmHg): 136/81 Accompanied By: self Schedule Follow-up Appointment: Yes Clinical Summary of Care: Patient Declined Electronic Signature(s) Signed: 11/15/2020 2:12:40 PM By: Yevonne Pax RN Entered By: Yevonne Pax on 11/15/2020 14:12:40 [name] [name], [name] [name] ([id number]) -------------------------------------------------------------------------------- Lower Extremity Assessment Details Patient Name: [name] [name], [name] Date of Service: 11/15/2020 10:00 AM Medical Record Number: [id number] Patient Account Number: [id number] Date of Birth/Sex: 05/15/1965 (55 y.o. F) Treating RN: Yevonne Pax Primary Care Opaline Reyburn: SYSTEM, PCP Other Clinician: Referring Kyros Salzwedel: Willy Eddy Treating Labaron Digirolamo/Extender: Maxwell Caul Weeks in Treatment: 8 Edema Assessment Assessed: [Left: No] [Right: No] Edema: [Left: Ye] [Right: s] Calf Left: Right: Point of Measurement: 29 cm From Medial Instep 30 cm Ankle Left: Right: Point of Measurement: 11 cm From Medial Instep 23 cm Vascular Assessment Pulses: Dorsalis Pedis Palpable: [Left:Yes] Electronic Signature(s) Signed: 11/16/2020 11:11:36 AM By: Yevonne Pax RN Entered By: Yevonne Pax on 11/15/2020 10:28:55 [name] [name], [name] [name] ([id number]) -------------------------------------------------------------------------------- Multi Wound Chart Details Patient Name: [name] [name], [name] Date of Service: 11/15/2020 10:00 AM Medical Record Number: [id number] Patient Account Number: [id number] Date of Birth/Sex: 03-Mar-1965 (55 y.o. F) Treating RN: Yevonne Pax Primary Care Trevante Tennell: SYSTEM, PCP Other Clinician: Referring Aysa Larivee: Willy Eddy Treating Tjay Velazquez/Extender: Altamese [place] in Treatment: 8 Vital Signs Height(in): 68 Pulse(bpm): 76 Weight(lbs): 216 Blood Pressure(mmHg): 136/81 Body Mass Index(BMI): 33 Temperature(F): 98.4 Respiratory Rate(breaths/min): 18 Photos: [N/A:N/A] Wound Location: Left, Midline, Anterior Lower Leg Left, Proximal, Lateral Lower Leg N/A Wounding Event: Trauma Trauma N/A Primary Etiology: Trauma, Other Diabetic Wound/Ulcer of the Lower N/A Extremity Comorbid History: Anemia, Asthma, Chronic Anemia, Asthma, Chronic N/A Obstructive Pulmonary Disease Obstructive Pulmonary Disease (COPD), Type II Diabetes, (COPD), Type II Diabetes, Osteoarthritis Osteoarthritis Date Acquired: 08/30/2020 11/12/2020 N/A Weeks of Treatment: 8 0 N/A Wound Status: Open Open N/A Measurements L x W x D (cm) 2x0.5x0.1 1.5x1.8x0.1 N/A Area (cm) : 0.785 2.121 N/A Volume (cm) : 0.079 0.212 N/A % Reduction in Area: 95.20% N/A N/A % Reduction in Volume: 98.40% N/A N/A Classification: Full Thickness Without Exposed Grade 2  N/A Support Structures Exudate Amount: Medium Medium N/A Exudate Type: Serosanguineous Serosanguineous N/A Exudate Color: red, brown red, brown N/A Wound Margin: Flat and Intact N/A N/A Granulation Amount: Large (67-100%) Large (67-100%) N/A Granulation Quality: Red, Friable Red N/A Necrotic Amount: None Present (0%) N/A N/A Exposed Structures: Fat Layer (Subcutaneous Tissue): Fat Layer (Subcutaneous Tissue): N/A Yes Yes Fascia: No Fascia: No Tendon: No Tendon: No Muscle: No Muscle: No Joint: No Joint: No Bone: No Bone: No Epithelialization: Large (67-100%) None N/A Debridement: Debridement - Selective/Open Debridement - Selective/Open N/A Wound Wound Pre-procedure Verification/Time 10:34 10:34 N/A Out Taken: Pain Control: Lidocaine 4% Topical Solution Lidocaine 4% Topical Solution N/A Tissue Debrided: Necrotic/Eschar, Ambulance person, Slough N/A Level: Skin/Epidermis Skin/Epidermis  N/A Debridement Area (sq cm): 1 2.7 N/A Instrument: Curette Curette N/A Bleeding: Minimum Minimum N/A Baril, Del R. ([id number]) Hemostasis Achieved: Pressure Pressure N/A Procedural Pain: 0 0 N/A Post Procedural Pain: 0 0 N/A Debridement Treatment Procedure was tolerated well Procedure was tolerated well N/A Response: Post Debridement 2x0.5x0.1 1.5x1.8x0.1 N/A Measurements L x W x D (cm) Post Debridement Volume: 0.079 0.212 N/A (cm) Procedures Performed: Debridement Debridement N/A Treatment Notes Electronic Signature(s) Signed: 11/15/2020 4:23:07 PM By: Baltazar Najjar MD Entered By: Baltazar Najjar on 11/15/2020 10:42:54 [name] [name], [name] [name] ([id number]) -------------------------------------------------------------------------------- Multi-Disciplinary Care Plan Details Patient Name: [name] [name], [name] Date of Service: 11/15/2020 10:00 AM Medical Record Number: [id number] Patient Account Number: [id number] Date of Birth/Sex: 03/03/1965 (55 y.o. F) Treating RN: Yevonne Pax Primary Care Treyveon Mochizuki: SYSTEM, PCP Other Clinician: Referring Paulita Licklider: Willy Eddy Treating Guthrie Lemme/Extender: Altamese [place] in Treatment: 8 Active Inactive Abuse / Safety / Falls / Self Care Management Nursing Diagnoses: History of Falls Potential for falls Goals: Patient will not experience any injury related to falls Date Initiated: 09/20/2020 Target Resolution Date: 10/20/2020 Goal Status: Active Interventions: Podiatry chair, stretcher in low position and side rails up as needed Provide education on safe transfers Notes: Medication Nursing Diagnoses: Knowledge deficit related to medication safety: actual or potential Goals: Patient/caregiver will demonstrate understanding of all current medications Date Initiated: 09/20/2020 Target Resolution Date: 10/20/2020 Goal Status: Active Patient/caregiver will demonstrate understanding of new oral/IV medications prescribed at the [facility] (topical prescriptions are covered under the skin breakdown problem) Date Initiated: 09/20/2020 Target Resolution Date: 10/20/2020 Goal Status: Active Interventions: Assess for medication contraindications each visit where new medications are prescribed Patient/Caregiver given reconciled medication list upon admission, changes in medications and discharge from the Wound Center Notes: Pain, Acute or Chronic Nursing Diagnoses: Pain Management - Non-cyclic Chronic Pain Pain, acute or chronic: actual or potential Potential alteration in comfort, pain Goals: Patient will verbalize adequate pain control and receive pain control interventions during procedures as needed Date Initiated: 09/20/2020 Target Resolution Date: 10/20/2020 Goal Status: Active Interventions: Assess comfort goal upon admission Treatment Activities: Administer pain control measures as ordered : 09/20/2020 [name] [name], [name] ([id number]) Notes: Soft Tissue Infection Nursing Diagnoses: Impaired tissue  integrity Knowledge deficit related to disease process and management Knowledge deficit related to home infection control: handwashing, handling of soiled dressings, supply storage Goals: Patient will remain free of wound infection Date Initiated: 09/20/2020 Target Resolution Date: 10/20/2020 Goal Status: Active Patient/caregiver will verbalize understanding of or measures to prevent infection and contamination in the home setting Date Initiated: 09/20/2020 Target Resolution Date: 10/20/2020 Goal Status: Active Signs and symptoms of infection will be recognized early to allow for prompt treatment Date Initiated: 09/20/2020 Target Resolution Date: 10/20/2020 Goal Status: Active Interventions: Assess signs and symptoms of infection every visit Provide education on infection Notes: Venous Leg Ulcer Nursing Diagnoses: Actual venous Insuffiency (use after diagnosis is confirmed) Knowledge deficit related to disease process and management Potential for venous Insuffiency (use before diagnosis confirmed) Goals: Patient will maintain optimal edema control Date Initiated: 09/20/2020 Target Resolution Date: 10/20/2020 Goal Status: Active Interventions: Provide education on venous insufficiency Notes: Electronic Signature(s) Signed: 11/16/2020 11:11:36 AM By: Yevonne Pax RN Entered By: Yevonne Pax on 11/15/2020 10:34:24 [name] [name], [name] [name] ([id number]) -------------------------------------------------------------------------------- Pain Assessment Details Patient Name: [name] [name], [name] Date of Service: 11/15/2020 10:00 AM Medical Record Number: [id number] Patient Account Number: [id number] Date of Birth/Sex: 09/15/1965 (55 y.o. F) Treating RN: Yevonne Pax Primary Care Rober Skeels: SYSTEM, PCP Other Clinician: Referring  Kennen Stammer: Willy Eddy Treating Yuliet Needs/Extender: Altamese [place] in Treatment: 8 Active Problems Location of Pain Severity and Description of Pain Patient Has  Paino No Site Locations Pain Management and Medication Current Pain Management: Electronic Signature(s) Signed: 11/16/2020 11:11:36 AM By: Yevonne Pax RN Entered By: Yevonne Pax on 11/15/2020 10:20:21 Sheffer, [name] [name] ([id number]) -------------------------------------------------------------------------------- Patient/Caregiver Education Details Patient Name: Mosey, Salisa R. Date of Service: 11/15/2020 10:00 AM Medical Record Number: [id number] Patient Account Number: [id number] Date of Birth/Gender: 1965-04-14 (55 y.o. F) Treating RN: Yevonne Pax Primary Care Physician: SYSTEM, PCP Other Clinician: Referring Physician: Willy Eddy Treating Physician/Extender: Altamese [place] in Treatment: 8 Education Assessment Education Provided To: Patient Education Topics Provided Infection: Methods: Explain/Verbal Responses: State content correctly Electronic Signature(s) Signed: 11/16/2020 11:11:36 AM By: Yevonne Pax RN Entered By: Yevonne Pax on 11/15/2020 11:05:23 Schrader, [name] [name] ([id number]) -------------------------------------------------------------------------------- Wound Assessment Details Patient Name: Figge, Mariaclara R. Date of Service: 11/15/2020 10:00 AM Medical Record Number: [id number] Patient Account Number: [id number] Date of Birth/Sex: 09-May-1965 (55 y.o. F) Treating RN: Yevonne Pax Primary Care Hassel Uphoff: SYSTEM, PCP Other Clinician: Referring Dauntae Derusha: Willy Eddy Treating Faryn Sieg/Extender: Maxwell Caul Weeks in Treatment: 8 Wound Status Wound Number: 1 Primary Trauma, Other Etiology: Wound Location: Left, Midline, Anterior Lower Leg Wound Open Wounding Event: Trauma Status: Date Acquired: 08/30/2020 Comorbid Anemia, Asthma, Chronic Obstructive Pulmonary Disease Weeks Of Treatment: 8 History: (COPD), Type II Diabetes, Osteoarthritis Clustered Wound: No Photos Wound Measurements Length: (cm) 2 Width: (cm) 0.5 Depth:  (cm) 0.1 Area: (cm) 0.785 Volume: (cm) 0.079 % Reduction in Area: 95.2% % Reduction in Volume: 98.4% Epithelialization: Large (67-100%) Tunneling: No Undermining: No Wound Description Classification: Full Thickness Without Exposed Support Structu Wound Margin: Flat and Intact Exudate Amount: Medium Exudate Type: Serosanguineous Exudate Color: red, brown res Foul Odor After Cleansing: No Slough/Fibrino No Wound Bed Granulation Amount: Large (67-100%) Exposed Structure Granulation Quality: Red, Friable Fascia Exposed: No Necrotic Amount: None Present (0%) Fat Layer (Subcutaneous Tissue) Exposed: Yes Tendon Exposed: No Muscle Exposed: No Joint Exposed: No Bone Exposed: No Treatment Notes Wound #1 (Lower Leg) Wound Laterality: Left, Midline, Anterior Cleanser Soap and Water Discharge Instruction: Gently cleanse wound with antibacterial soap, rinse and pat dry prior to dressing wounds Peri-Wound Care Belmares, Lyrick R. ([id number]) Topical Primary Dressing Hydrofera Blue Ready Transfer Foam, 2.5x2.5 (in/in) Discharge Instruction: Apply Hydrofera Blue Ready to wound bed as directed-cut hydrofera big enough to cover wound bed AND catch drainage. IF USING HYDROFERA CLASSIC, PLASTIC SIDE DOES NOT TOUCH WOUND BED. Please call office for clarification if needed. Secondary Dressing ABD Pad 5x9 (in/in) Discharge Instruction: Cover with ABD pad Secured With Compression Wrap Profore Lite LF 3 Multilayer Compression Bandaging System Discharge Instruction: Apply 3 multi-layer wrap as prescribed. Compression Stockings Add-Ons Electronic Signature(s) Signed: 11/16/2020 11:11:36 AM By: Yevonne Pax RN Entered By: Yevonne Pax on 11/15/2020 10:26:03 Dunsworth, [name] [name] ([id number]) -------------------------------------------------------------------------------- Wound Assessment Details Patient Name: Mcphee, Comfort R. Date of Service: 11/15/2020 10:00 AM Medical Record Number:  [id number] Patient Account Number: [id number] Date of Birth/Sex: [birth year] (55 y.o. F) Treating RN: Yevonne Pax Primary Care Marcia Lepera: SYSTEM, PCP Other Clinician: Referring Shawne Eskelson: Willy Eddy Treating Welda Azzarello/Extender: Maxwell Caul Weeks in Treatment: 8 Wound Status Wound Number: 2 Primary Diabetic Wound/Ulcer of the Lower Extremity Etiology: Wound Location: Left, Proximal, Lateral Lower Leg Wound Open Wounding Event: Trauma Status: Date Acquired: 11/12/2020 Comorbid Anemia, Asthma, Chronic Obstructive Pulmonary Disease Weeks Of Treatment: 0 History: (COPD), Type II Diabetes, Osteoarthritis Clustered Wound: No Photos  Wound Measurements Length: (cm) 1.5 Width: (cm) 1.8 Depth: (cm) 0.1 Area: (cm) 2.121 Volume: (cm) 0.212 % Reduction in Area: % Reduction in Volume: Epithelialization: None Tunneling: No Undermining: No Wound Description Classification: Grade 2 Exudate Amount: Medium Exudate Type: Serosanguineous Exudate Color: red, brown Foul Odor After Cleansing: No Slough/Fibrino No Wound Bed Granulation Amount: Large (67-100%) Exposed Structure Granulation Quality: Red Fascia Exposed: No Fat Layer (Subcutaneous Tissue) Exposed: Yes Tendon Exposed: No Muscle Exposed: No Joint Exposed: No Bone Exposed: No Treatment Notes Wound #2 (Lower Leg) Wound Laterality: Left, Lateral, Proximal Cleanser Soap and Water Discharge Instruction: Gently cleanse wound with antibacterial soap, rinse and pat dry prior to dressing wounds Peri-Wound Care Lento, Triston R. ([id number]) Topical Primary Dressing Hydrofera Blue Ready Transfer Foam, 2.5x2.5 (in/in) Discharge Instruction: Apply Hydrofera Blue Ready to wound bed as directed-cut hydrofera big enough to cover wound bed AND catch drainage. IF USING HYDROFERA CLASSIC, PLASTIC SIDE DOES NOT TOUCH WOUND BED. Please call office for clarification if needed. Secondary Dressing ABD Pad 5x9 (in/in) Discharge  Instruction: Cover with ABD pad Secured With Compression Wrap Profore Lite LF 3 Multilayer Compression Bandaging System Discharge Instruction: Apply 3 multi-layer wrap as prescribed. Compression Stockings Add-Ons Electronic Signature(s) Signed: 11/16/2020 11:11:36 AM By: Yevonne Pax RN Entered By: Yevonne Pax on 11/15/2020 10:28:04 Nisley, [name] [name] ([id number]) -------------------------------------------------------------------------------- Vitals Details Patient Name: Colarusso, Sadye R. Date of Service: 11/15/2020 10:00 AM Medical Record Number: [id number] Patient Account Number: [id number] Date of Birth/Sex: 1965-08-30 (55 y.o. F) Treating RN: Yevonne Pax Primary Care Cristiana Yochim: SYSTEM, PCP Other Clinician: Referring Huldah Marin: Willy Eddy Treating Shalamar Plourde/Extender: Altamese [place] in Treatment: 8 Vital Signs Time Taken: 10:19 Temperature (F): 98.4 Height (in): 68 Pulse (bpm): 76 Weight (lbs): 216 Respiratory Rate (breaths/min): 18 Body Mass Index (BMI): 32.8 Blood Pressure (mmHg): 136/81 Reference Range: 80 - 120 mg / dl Electronic Signature(s) Signed: 11/16/2020 11:11:36 AM By: Yevonne Pax RN Entered By: Yevonne Pax on 11/15/2020 10:19:56

## 2020-11-18 ENCOUNTER — Emergency Department: Payer: Medicaid Other

## 2020-11-18 ENCOUNTER — Other Ambulatory Visit: Payer: Self-pay

## 2020-11-18 ENCOUNTER — Emergency Department
Admission: EM | Admit: 2020-11-18 | Discharge: 2020-11-18 | Disposition: A | Discharge status: Home / Self Care | Payer: Medicaid Other | Attending: Emergency Medicine | Admitting: Emergency Medicine

## 2020-11-18 DIAGNOSIS — Z7901 Long term (current) use of anticoagulants: Secondary | ICD-10-CM | POA: Insufficient documentation

## 2020-11-18 DIAGNOSIS — S0990XA Unspecified injury of head, initial encounter: Secondary | ICD-10-CM | POA: Diagnosis not present

## 2020-11-18 DIAGNOSIS — I129 Hypertensive chronic kidney disease with stage 1 through stage 4 chronic kidney disease, or unspecified chronic kidney disease: Secondary | ICD-10-CM | POA: Insufficient documentation

## 2020-11-18 DIAGNOSIS — D631 Anemia in chronic kidney disease: Secondary | ICD-10-CM | POA: Insufficient documentation

## 2020-11-18 DIAGNOSIS — Z794 Long term (current) use of insulin: Secondary | ICD-10-CM | POA: Insufficient documentation

## 2020-11-18 DIAGNOSIS — W01198A Fall on same level from slipping, tripping and stumbling with subsequent striking against other object, initial encounter: Secondary | ICD-10-CM | POA: Diagnosis not present

## 2020-11-18 DIAGNOSIS — Z96651 Presence of right artificial knee joint: Secondary | ICD-10-CM | POA: Diagnosis not present

## 2020-11-18 DIAGNOSIS — J449 Chronic obstructive pulmonary disease, unspecified: Secondary | ICD-10-CM | POA: Diagnosis not present

## 2020-11-18 DIAGNOSIS — F17210 Nicotine dependence, cigarettes, uncomplicated: Secondary | ICD-10-CM | POA: Insufficient documentation

## 2020-11-18 DIAGNOSIS — Z7982 Long term (current) use of aspirin: Secondary | ICD-10-CM | POA: Insufficient documentation

## 2020-11-18 DIAGNOSIS — Z91040 Latex allergy status: Secondary | ICD-10-CM | POA: Insufficient documentation

## 2020-11-18 DIAGNOSIS — N189 Chronic kidney disease, unspecified: Secondary | ICD-10-CM | POA: Insufficient documentation

## 2020-11-18 DIAGNOSIS — E119 Type 2 diabetes mellitus without complications: Secondary | ICD-10-CM | POA: Insufficient documentation

## 2020-11-18 DIAGNOSIS — S81802A Unspecified open wound, left lower leg, initial encounter: Secondary | ICD-10-CM | POA: Diagnosis not present

## 2020-11-18 DIAGNOSIS — Y9301 Activity, walking, marching and hiking: Secondary | ICD-10-CM | POA: Insufficient documentation

## 2020-11-18 DIAGNOSIS — S8992XA Unspecified injury of left lower leg, initial encounter: Secondary | ICD-10-CM | POA: Diagnosis present

## 2020-11-18 DIAGNOSIS — Z7951 Long term (current) use of inhaled steroids: Secondary | ICD-10-CM | POA: Insufficient documentation

## 2020-11-18 DIAGNOSIS — W19XXXA Unspecified fall, initial encounter: Secondary | ICD-10-CM

## 2020-11-18 DIAGNOSIS — Z79899 Other long term (current) drug therapy: Secondary | ICD-10-CM | POA: Insufficient documentation

## 2020-11-18 LAB — CBC WITH DIFFERENTIAL/PLATELET
Abs Immature Granulocytes: 0.01 10*3/uL (ref 0.00–0.07)
Basophils Absolute: 0 10*3/uL (ref 0.0–0.1)
Basophils Relative: 0 %
Eosinophils Absolute: 0.3 10*3/uL (ref 0.0–0.5)
Eosinophils Relative: 5 %
HCT: 30.4 % — ABNORMAL LOW (ref 36.0–46.0)
Hemoglobin: 10.1 g/dL — ABNORMAL LOW (ref 12.0–15.0)
Immature Granulocytes: 0 %
Lymphocytes Relative: 38 %
Lymphs Abs: 2.4 10*3/uL (ref 0.7–4.0)
MCH: 31.1 pg (ref 26.0–34.0)
MCHC: 33.2 g/dL (ref 30.0–36.0)
MCV: 93.5 fL (ref 80.0–100.0)
Monocytes Absolute: 0.7 10*3/uL (ref 0.1–1.0)
Monocytes Relative: 10 %
Neutro Abs: 2.9 10*3/uL (ref 1.7–7.7)
Neutrophils Relative %: 47 %
Platelets: 343 10*3/uL (ref 150–400)
RBC: 3.25 MIL/uL — ABNORMAL LOW (ref 3.87–5.11)
RDW: 12.7 % (ref 11.5–15.5)
WBC: 6.3 10*3/uL (ref 4.0–10.5)
nRBC: 0 % (ref 0.0–0.2)

## 2020-11-18 LAB — COMPREHENSIVE METABOLIC PANEL
ALT: 23 U/L (ref 0–44)
AST: 33 U/L (ref 15–41)
Albumin: 3.4 g/dL — ABNORMAL LOW (ref 3.5–5.0)
Alkaline Phosphatase: 62 U/L (ref 38–126)
Anion gap: 10 (ref 5–15)
BUN: 31 mg/dL — ABNORMAL HIGH (ref 6–20)
CO2: 22 mmol/L (ref 22–32)
Calcium: 9.2 mg/dL (ref 8.9–10.3)
Chloride: 105 mmol/L (ref 98–111)
Creatinine, Ser: 1.45 mg/dL — ABNORMAL HIGH (ref 0.44–1.00)
GFR, Estimated: 43 mL/min — ABNORMAL LOW (ref >60)
Glucose, Bld: 106 mg/dL — ABNORMAL HIGH (ref 70–99)
Potassium: 4.5 mmol/L (ref 3.5–5.1)
Sodium: 137 mmol/L (ref 135–145)
Total Bilirubin: 0.7 mg/dL (ref 0.3–1.2)
Total Protein: 8 g/dL (ref 6.5–8.1)

## 2020-11-18 LAB — URINALYSIS, MICROSCOPIC (REFLEX): RBC / HPF: NONE SEEN RBC/hpf (ref 0–5)

## 2020-11-18 LAB — URINALYSIS, ROUTINE W REFLEX MICROSCOPIC
Bilirubin Urine: NEGATIVE
Glucose, UA: NEGATIVE mg/dL
Hgb urine dipstick: NEGATIVE
Ketones, ur: NEGATIVE mg/dL
Leukocytes,Ua: NEGATIVE
Nitrite: NEGATIVE
Protein, ur: 30 mg/dL — AB
Specific Gravity, Urine: 1.009 (ref 1.005–1.030)
pH: 6 (ref 5.0–8.0)

## 2020-11-18 MED ORDER — FENTANYL CITRATE PF 50 MCG/ML IJ SOSY
50.0000 ug | PREFILLED_SYRINGE | Freq: Once | INTRAMUSCULAR | Status: AC
  Administered 2020-11-18: 50 ug via INTRAVENOUS
  Filled 2020-11-18: qty 1

## 2020-11-18 MED ORDER — ONDANSETRON HCL 4 MG/2ML IJ SOLN
4.0000 mg | Freq: Once | INTRAMUSCULAR | Status: AC
  Administered 2020-11-18: 4 mg via INTRAVENOUS
  Filled 2020-11-18: qty 2

## 2020-11-18 NOTE — ED Provider Notes (Signed)
[facility] Emergency Department Provider Note  ____________________________________________   None    (approximate)  I have reviewed the triage vital signs and the nursing notes.   HISTORY  Chief Complaint Fall    HPI [name] [name] is a 55 y.o. female  here with fall. Pt reports that she fell this AM. She is unsure what made her fall. Main complaint is L leg pain, which is somewhat chronic. She does not believe it is any worse than usual. She has a dressing on her L leg and states it has not been changed in 1 week. Denies fevers, chills. Denies any head pain but did hit her head during the fall, on Plavix. No neck pain, UE numbness or weakness. SHe is unsure whether her leg wounds are any better/worse than usual. No other complaints. Pain is aching, throbbing, chronic as mentioned. Worse w/ movement, palpation. No alleviating factors.       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   Acute lower UTI 01/04/2020   Pressure injury of skin 01/03/2020   Dysphagia 01/03/2020   Hypocalcemia 01/03/2020   Anemia in chronic kidney disease (CKD) 01/03/2020   Sepsis (HCC) 01/02/2020   Type 2 diabetes mellitus with hypoglycemia without coma (HCC) 01/02/2020   AKI (acute kidney injury) (HCC) 01/01/2020   Hyperlipidemia    Essential hypertension    Depression with anxiety    Chronic obstructive pulmonary disease (HCC)    Tobacco abuse 10/21/2019   Sensory disturbance 10/21/2019   TIA (transient ischemic attack) 10/20/2019   Altered mental status    Loss of consciousness (HCC)  09/08/2018   Encephalopathy 09/08/2018   S/P TKR (total knee replacement) using cement, 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   Anxiety 04/02/2017   Urinary incontinence 01/25/2016   Fecal incontinence 01/25/2016   Dysthymia 01/25/2016    Past Surgical History:  Procedure Laterality Date   COLONOSCOPY WITH PROPOFOL N/A 11/27/2018   Procedure: COLONOSCOPY WITH PROPOFOL;  Surgeon: Toledo, Boykin Nearing, MD;  Location: [facility];  Service: Gastroenterology;  Laterality: N/A;   ESOPHAGOGASTRODUODENOSCOPY (EGD) WITH PROPOFOL N/A 11/27/2018   Procedure: ESOPHAGOGASTRODUODENOSCOPY (EGD) WITH PROPOFOL;  Surgeon: Toledo, Boykin Nearing, MD;  Location: [facility];  Service: Gastroenterology;  Laterality: N/A;   NO PAST SURGERIES     TOTAL KNEE ARTHROPLASTY Right 10/03/2017   Procedure: TOTAL KNEE ARTHROPLASTY;  Surgeon: Lyndle Herrlich, MD;  Location: [facility];  Service: Orthopedics;  Laterality: Right;    Prior to Admission medications   Medication Sig Start Date End Date Taking? Authorizing Provider  amLODipine (NORVASC) 10 MG tablet Take 10 mg by mouth daily. 11/08/20  Yes [provider]  aspirin EC 81 MG tablet Take 81 mg by mouth daily. Swallow whole.   Yes [provider]  atorvastatin (LIPITOR) 40 MG tablet Take 40 mg by mouth at bedtime. (2000)   Yes [provider]  clopidogrel (PLAVIX) 75 MG tablet Take 1 tablet (75 mg total) by mouth daily with breakfast. 10/23/19  Yes Vassie Loll, MD  dexlansoprazole (DEXILANT) 60 MG capsule Take 1 capsule by mouth daily. 11/08/20  Yes [provider]  diclofenac sodium (VOLTAREN) 1 % GEL Apply 2 g topically 4 (four) times daily as needed. Patient taking differently: Apply 2 g topically in the morning and at bedtime. 09/13/18  Yes Briant Cedar, MD  escitalopram (LEXAPRO) 10 MG tablet Take 10 mg by mouth daily.   Yes [provider]  Fluticasone-Salmeterol (ADVAIR) 100-50 MCG/DOSE AEPB Inhale 1 puff into the lungs 2 (two)  times daily.   Yes [provider]  acetaminophen (TYLENOL) 650 MG CR tablet Take 650 mg by mouth every 8 (eight) hours as needed for pain.    [provider]  albuterol (PROVENTIL HFA;VENTOLIN HFA) 108 (90 Base) MCG/ACT inhaler Inhale 2 puffs into the lungs every 4 (four) hours as needed for wheezing or shortness of breath.    [provider]  amLODipine (NORVASC) 5 MG tablet Take 1 tablet (5 mg total) by mouth every morning. 01/08/20 02/07/20  Sherryll Burger, Pratik D, DO  busPIRone (BUSPAR) 15 MG tablet Take 1 tablet (15 mg total) by mouth 3 (three) times daily. Patient not taking: No sig reported 02/19/20   Aldean Baker, NP  cholecalciferol (VITAMIN D) 25 MCG (1000 UNIT) tablet Take 1 tablet (1,000 Units total) by mouth daily. (0800) Patient not taking: Reported on 11/12/2020 10/22/19   Vassie Loll, MD  cholecalciferol (VITAMIN D3) 25 MCG (1000 UT) tablet Take 1,000 Units by mouth every morning.    [provider]  Dexlansoprazole 30 MG capsule Take 30 mg by mouth daily. Patient not taking: Reported on 11/18/2020    [provider]  docusate sodium (COLACE) 100 MG capsule Take 1 capsule (100 mg total) by mouth 2 (two) times daily. Patient not taking: No sig reported 10/22/19   Vassie Loll, MD  ferrous sulfate 325 (65 FE) MG tablet Take 1 tablet (325 mg total) by mouth 2 (two) times daily with a meal. Patient taking differently: Take 325 mg by mouth daily with breakfast. 09/13/18 11/12/20  Briant Cedar, MD  ferrous sulfate 325 (65 FE) MG tablet Take 1 tablet by mouth daily.    [provider]  folic acid (FOLVITE) 1 MG tablet Take 1 mg by mouth daily. Patient not taking: No sig reported    [provider]  furosemide (LASIX) 20 MG tablet Take 1 tablet (20 mg total) by mouth 2 (two) times daily. (0800) Patient taking differently: Take 20 mg by mouth daily. (0800) 10/22/19   Vassie Loll, MD  guaiFENesin (ROBITUSSIN) 100 MG/5ML SOLN  Take 5 mLs by mouth 3 (three) times daily. Patient not taking: Reported on 11/12/2020    [provider]  HYDROcodone-acetaminophen (NORCO/VICODIN) 5-325 MG tablet Take 1 tablet by mouth every 4 (four) hours as needed for severe pain. Patient taking differently: Take 1 tablet by mouth 2 (two) times daily. 09/22/20 09/22/21  Shaune Pollack, MD  hydrOXYzine (VISTARIL) 25 MG capsule Take 25 mg by mouth at bedtime.    [provider]  LANTUS SOLOSTAR 100 UNIT/ML Solostar Pen Inject 12 Units into the skin at bedtime. 01/13/20   [provider]  LORazepam (ATIVAN) 1 MG tablet Take 1 mg by mouth 2 (two) times daily as needed for anxiety. 02/09/20   [provider]  megestrol (MEGACE) 40 MG tablet Take 40 mg by mouth daily.    [provider]  metoprolol tartrate (LOPRESSOR) 25 MG tablet Take 25  mg by mouth daily.    [provider]  Multiple Vitamins-Minerals (MULTIVITAMIN WITH MINERALS) tablet Take 1 tablet by mouth daily.    [provider]  nicotine (NICODERM CQ - DOSED IN MG/24 HOURS) 21 mg/24hr patch Place 1 patch (21 mg total) onto the skin daily. Patient not taking: No sig reported 10/23/19   Vassie Loll, MD  pantoprazole (PROTONIX) 40 MG tablet Take 1 tablet (40 mg total) by mouth daily. 09/14/18 10/20/19  Briant Cedar, MD  sodium bicarbonate 650 MG tablet Take 650 mg by mouth daily. 02/17/20   [provider]  traMADol (ULTRAM) 50 MG tablet Take 50 mg by mouth 4 (four) times daily.  Patient not taking: Reported on 11/12/2020 09/30/19   [provider]  traZODone (DESYREL) 100 MG tablet Take 100 mg by mouth at bedtime. Patient not taking: Reported on 11/12/2020    [provider]  vitamin B-12 (CYANOCOBALAMIN) 1000 MCG tablet Take 1,000 mcg by mouth daily. Patient not taking: Reported on 11/12/2020    [provider]  zolpidem (AMBIEN) 10 MG tablet Take 10 mg by mouth at bedtime as needed. Patient  not taking: Reported on 11/12/2020 02/09/20   [provider]    Allergies Latex  Family History  Problem Relation Age of Onset   Heart disease Mother    Heart disease Father     Social History Social History   Tobacco Use   Smoking status: Every Day    Types: Cigarettes   Smokeless tobacco: Never  Vaping Use   Vaping Use: Never used  Substance Use Topics   Alcohol use: No   Drug use: No    Review of Systems  Review of Systems  Constitutional:  Negative for chills and fever.  HENT:  Negative for sore throat.   Respiratory:  Negative for shortness of breath.   Cardiovascular:  Negative for chest pain.  Gastrointestinal:  Negative for abdominal pain.  Genitourinary:  Negative for flank pain.  Musculoskeletal:  Positive for arthralgias and gait problem (due to h/o cva, left leg pain). Negative for neck pain.  Skin:  Positive for wound. Negative for rash.  Allergic/Immunologic: Negative for immunocompromised state.  Neurological:  Negative for weakness and numbness.  Hematological:  Does not bruise/bleed easily.  All other systems reviewed and are negative.   ____________________________________________  PHYSICAL EXAM:      VITAL SIGNS: ED Triage Vitals  Enc Vitals Group     BP 11/18/20 0725 (!) 148/87     Pulse Rate 11/18/20 0720 93     Resp 11/18/20 0720 15     Temp 11/18/20 0720 97.7 F (36.5 C)     Temp Source 11/18/20 0720 Oral     SpO2 11/18/20 0720 100 %     Weight 11/18/20 0721 200 lb (90.7 kg)     Height 11/18/20 0721 5\' 8"  (1.727 m)     Head Circumference --      Peak Flow --      Pain Score 11/18/20 0721 8     Pain Loc --      Pain Edu? --      Excl. in GC? --      Physical Exam Vitals and nursing note reviewed.  Constitutional:      General: She is not in acute distress.    Appearance: She is well-developed.  HENT:     Head: Normocephalic and atraumatic.  Eyes:     Conjunctiva/sclera: Conjunctivae normal.  Neck:  Comments:  No overt midline or paraspinal TTP Cardiovascular:     Rate and Rhythm: Normal rate and regular rhythm.     Heart sounds: Normal heart sounds.  Pulmonary:     Effort: Pulmonary effort is normal. No respiratory distress.     Breath sounds: No wheezing.  Abdominal:     General: There is no distension.  Musculoskeletal:     Cervical back: Neck supple.  Skin:    General: Skin is warm.     Capillary Refill: Capillary refill takes less than 2 seconds.     Findings: No rash.     Comments: Superficial ulceration to left anterior shin, with intact granulation tissue, measuring <1 x 2 cm, with no erythema, warmth, drainage, or fluctuance. Secondary excoriations noted from itching.  Neurological:     Mental Status: She is alert and oriented to person, place, and time.     Motor: No abnormal muscle tone.      ____________________________________________   LABS (all labs ordered are listed, but only abnormal results are displayed)  Labs Reviewed  CBC WITH DIFFERENTIAL/PLATELET - Abnormal; Notable for the following components:      Result Value   RBC 3.25 (*)    Hemoglobin 10.1 (*)    HCT 30.4 (*)    All other components within normal limits  COMPREHENSIVE METABOLIC PANEL - Abnormal; Notable for the following components:   Glucose, Bld 106 (*)    BUN 31 (*)    Creatinine, Ser 1.45 (*)    Albumin 3.4 (*)    GFR, Estimated 43 (*)    All other components within normal limits  URINALYSIS, ROUTINE W REFLEX MICROSCOPIC - Abnormal; Notable for the following components:   Color, Urine STRAW (*)    APPearance CLEAR (*)    Protein, ur 30 (*)    All other components within normal limits    ____________________________________________  EKG:  ________________________________________  RADIOLOGY All imaging, including plain films, CT scans, and ultrasounds, independently reviewed by me, and interpretations confirmed via formal radiology reads.  ED MD interpretation:   Normal sinus rhthm,  VR 83. PR 130, QRS 92, QTc 463. No acute St elevations or depressions. No ischemia or infarct.  Official radiology report(s): DG Tibia/Fibula Left  Result Date: 11/18/2020 CLINICAL DATA:  55 year old female status post fall with left lower extremity pain. EXAM: LEFT TIBIA AND FIBULA - 2 VIEW COMPARISON:  Recent left tib fib series 11/12/2020. FINDINGS: Calcified peripheral vascular disease redemonstrated. Bulky tricompartmental degenerative spurring redemonstrated at the left knee with joint space loss. Small suprapatellar joint effusion is stable. Alignment and joint spaces at the left ankle within normal limits. No acute osseous abnormality identified. IMPRESSION: 1. No acute fracture or dislocation identified about the left tib-fib. 2. Severe tricompartmental degeneration of the left knee with small joint effusion. 3. Calcified peripheral vascular disease. Electronically Signed   By: Odessa Fleming M.D.   On: 11/18/2020 09:17   CT HEAD WO CONTRAST ( )  Result Date: 11/18/2020 CLINICAL DATA:  Fall with head injury EXAM: CT HEAD WITHOUT CONTRAST CT CERVICAL SPINE WITHOUT CONTRAST TECHNIQUE: Multidetector CT imaging of the head and cervical spine was performed following the standard protocol without intravenous contrast. Multiplanar CT image reconstructions of the cervical spine were also generated. COMPARISON:  09/06/2020 FINDINGS: CT HEAD FINDINGS Brain: No evidence of acute infarction, hemorrhage, hydrocephalus, extra-axial collection or mass lesion/mass effect. Chronic small vessel ischemia with confluent gliosis and multiple chronic lacunar infarcts at the deep gray nuclei. Remote  left inferior cerebellar infarction. Premature brain atrophy. Vascular: Atheromatous calcification. Skull: Negative for fracture Sinuses/Orbits: No visible injury CT CERVICAL SPINE FINDINGS Alignment: No traumatic malalignment Skull base and vertebrae: No acute fracture Soft tissues and spinal canal: No prevertebral fluid or  swelling. No visible canal hematoma. Disc levels: Generalized degenerative disc collapse and endplate irregularity with uncovertebral spurs encroaching on multiple foramina. Severe facet osteoarthritis on the right at C2-3. Spurs and disc material cause implied cord impingement especially at C3-4 to C5-6. Upper chest: Negative IMPRESSION: 1. No evidence of acute intracranial or cervical spine injury. 2. Advanced chronic small vessel disease. 3. 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 Electronically Signed   By: Tiburcio Pea M.D.   On: 11/18/2020 08:06   CT Cervical Spine Wo Contrast  Result Date: 11/18/2020 CLINICAL DATA:  Fall with head injury EXAM: CT HEAD WITHOUT CONTRAST CT CERVICAL SPINE WITHOUT CONTRAST TECHNIQUE: Multidetector CT imaging of the head and cervical spine was performed following the standard protocol without intravenous contrast. Multiplanar CT image reconstructions of the cervical spine were also generated. COMPARISON:  09/06/2020 FINDINGS: CT HEAD FINDINGS Brain: No evidence of acute infarction, hemorrhage, hydrocephalus, extra-axial collection or mass lesion/mass effect. Chronic small vessel ischemia with confluent gliosis and multiple chronic lacunar infarcts at the deep gray nuclei. Remote left inferior cerebellar infarction. Premature brain atrophy. Vascular: Atheromatous calcification. Skull: Negative for fracture Sinuses/Orbits: No visible injury CT CERVICAL SPINE FINDINGS Alignment: No traumatic malalignment Skull base and vertebrae: No acute fracture Soft tissues and spinal canal: No prevertebral fluid or swelling. No visible canal hematoma. Disc levels: Generalized degenerative disc collapse and endplate irregularity with uncovertebral spurs encroaching on multiple foramina. Severe facet osteoarthritis on the right at C2-3. Spurs and disc material cause implied cord impingement especially at C3-4 to C5-6. Upper chest: Negative IMPRESSION: 1. No evidence of  acute intracranial or cervical spine injury. 2. Advanced chronic small vessel disease. 3. 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 Electronically Signed   By: Tiburcio Pea M.D.   On: 11/18/2020 08:06   DG Chest Portable 1 View  Result Date: 11/18/2020 CLINICAL DATA:  55 year old female status post fall with pain. EXAM: PORTABLE CHEST 1 VIEW COMPARISON:  Portable chest 09/12/2020 and earlier. FINDINGS: Portable AP semi upright view at 0836 hours. Less kyphotic positioning. Lung volumes are lower than baseline with mild accentuation of cardiac size. Other mediastinal contours are within normal limits. Visualized tracheal air column is within normal limits. Allowing for portable technique the lungs are clear. No pneumothorax or pleural effusion identified. Negative visible bowel gas. No acute osseous abnormality identified. IMPRESSION: No acute cardiopulmonary abnormality or acute traumatic injury identified. Electronically Signed   By: Odessa Fleming M.D.   On: 11/18/2020 09:19   DG Hip Unilat W or Wo Pelvis 2-3 Views Left  Result Date: 11/18/2020 CLINICAL DATA:  55 year old female status post fall with left lower extremity pain. EXAM: DG HIP (WITH OR WITHOUT PELVIS) 2-3V LEFT COMPARISON:  Abdomen and pelvis radiograph 01/02/2020. FINDINGS: Femoral heads are normally located. Pelvis appears intact. SI joints appear stable. Proximal left femur and visible left femoral shaft appear intact. There is bilateral acetabular degenerative spurring, perhaps greater on the left. Grossly intact proximal right femur. Lumbar spine disc and endplate degeneration. Calcified aortoiliac atherosclerosis. Negative visible bowel gas pattern. IMPRESSION: No acute fracture or dislocation identified about the left hip or pelvis. Electronically Signed   By: Odessa Fleming M.D.   On: 11/18/2020 09:18    ____________________________________________  PROCEDURES   Procedure(s) performed (including Critical  Care):  Procedures  ____________________________________________  INITIAL IMPRESSION / MDM / ASSESSMENT AND PLAN / ED COURSE  As part of my medical decision making, I reviewed the following data within the electronic MEDICAL RECORD NUMBER Nursing notes reviewed and incorporated, Old chart reviewed, Notes from prior ED visits, and [place] Controlled Substance Database       *SHELAINE CASTANEDA was evaluated in Emergency Department on 11/18/2020 for the symptoms described in the history of present illness. She was evaluated in the context of the global COVID-19 pandemic, which necessitated consideration that the patient might be at risk for infection with the SARS-CoV-2 virus that causes COVID-19. Institutional protocols and algorithms that pertain to the evaluation of patients at risk for COVID-19 are in a state of rapid change based on information released by regulatory bodies including the CDC and federal and state organizations. These policies and algorithms were followed during the patient's care in the ED.  Some ED evaluations and interventions may be delayed as a result of limited staffing during the pandemic.*     Medical Decision Making: 55 year old female here with leg pain after fall.  The patient has somewhat chronic leg pain per review of records, and exam is largely unremarkable with no evidence of new trauma or significant wound dehiscence.  Patient has no leukocytosis.  UA without UTI.  Plain films obtained of any areas of pain, reviewed, and are negative.  CT head and C-spine negative.  Patient is at her mental baseline per review of records.  Will discharge back to facility with return precautions.  Wound was redressed and wrapped.  ____________________________________________  FINAL CLINICAL IMPRESSION(S) / ED DIAGNOSES  Final diagnoses:  Fall, initial encounter  Wound of left lower extremity, initial encounter     MEDICATIONS GIVEN DURING THIS VISIT:  Medications  fentaNYL  (SUBLIMAZE) injection 50 mcg (50 mcg Intravenous Given 11/18/20 0807)  ondansetron (ZOFRAN) injection 4 mg (4 mg Intravenous Given 11/18/20 2500)     ED Discharge Orders     None        Note:  This document was prepared using Dragon voice recognition software and may include unintentional dictation errors.   Shaune Pollack, MD 11/18/20 [phone]

## 2020-11-18 NOTE — ED Notes (Signed)
Xray called to come do port xrays

## 2020-11-18 NOTE — ED Notes (Signed)
Xray called and stated that pt was swinging at staff and unwilling to lay flat for xray- Dr Erma Heritage made aware, see new orders

## 2020-11-18 NOTE — ED Triage Notes (Signed)
Pt arrives via EMS from the [facility]- pt had a fall while walking with her walker- pt did hit her head but denies LOC- pt has no complaints from the fall, but does have pain in her L leg from from a previous injury

## 2020-11-18 NOTE — ED Notes (Signed)
Pt taken for scans 

## 2020-11-18 NOTE — ED Notes (Signed)
Awaiting EMS

## 2020-11-18 NOTE — ED Notes (Signed)
Pt assisted to use bedside commode with 2 person assist

## 2020-11-29 ENCOUNTER — Encounter: Payer: Medicaid Other | Attending: Internal Medicine | Admitting: Internal Medicine

## 2020-11-29 ENCOUNTER — Other Ambulatory Visit: Payer: Self-pay

## 2020-11-29 DIAGNOSIS — S81812A Laceration without foreign body, left lower leg, initial encounter: Secondary | ICD-10-CM | POA: Diagnosis not present

## 2020-11-29 DIAGNOSIS — L97822 Non-pressure chronic ulcer of other part of left lower leg with fat layer exposed: Secondary | ICD-10-CM | POA: Insufficient documentation

## 2020-11-29 DIAGNOSIS — F0150 Vascular dementia without behavioral disturbance: Secondary | ICD-10-CM | POA: Insufficient documentation

## 2020-11-29 DIAGNOSIS — X58XXXA Exposure to other specified factors, initial encounter: Secondary | ICD-10-CM | POA: Insufficient documentation

## 2020-11-29 DIAGNOSIS — E11622 Type 2 diabetes mellitus with other skin ulcer: Secondary | ICD-10-CM | POA: Diagnosis not present

## 2020-11-29 NOTE — Progress Notes (Signed)
TAJIA, SZELIGA ([id number]) Visit Report for 11/29/2020 Arrival Information Details Patient Name: [name] [name], [name] [name]. Date of Service: 11/29/2020 10:00 AM Medical Record Number: [id number] Patient Account Number: [id number] Date of Birth/Sex: 10-Sep-1965 (55 y.o. F) Treating RN: Rogers Blocker Primary Care Kaithlyn Teagle: SYSTEM, PCP Other Clinician: Referring Vinal Rosengrant: Willy Eddy Treating Amahd Morino/Extender: Altamese [place] in Treatment: 10 Visit Information History Since Last Visit Pain Present Now: Yes Patient Arrived: Walker Arrival Time: 10:07 Accompanied By: caregiver Transfer Assistance: None Patient Identification Verified: Yes Secondary Verification Process Completed: Yes Patient Requires Transmission-Based Precautions: No Patient Has Alerts: Yes Patient Alerts: Type II Diabetic Electronic Signature(s) Signed: 11/29/2020 5:03:00 PM By: Rogers Blocker RN Entered By: Rogers Blocker on 11/29/2020 10:07:50 [name] [name] ([id number]) -------------------------------------------------------------------------------- Clinic Level of Care Assessment Details Patient Name: Falin, Sedra R. Date of Service: 11/29/2020 10:00 AM Medical Record Number: [id number] Patient Account Number: [id number] Date of Birth/Sex: 07/29/1965 (55 y.o. F) Treating RN: Rogers Blocker Primary Care Jahleah Mariscal: SYSTEM, PCP Other Clinician: Referring Courteny Egler: Willy Eddy Treating Thermon Zulauf/Extender: Altamese [place] in Treatment: 10 Clinic Level of Care Assessment Items TOOL 1 Quantity Score []  - Use when EandM and Procedure is performed on INITIAL visit 0 ASSESSMENTS - Nursing Assessment / Reassessment []  - General Physical Exam (combine w/ comprehensive assessment (listed just below) when performed on new 0 pt. evals) []  - 0 Comprehensive Assessment (HX, ROS, Risk Assessments, Wounds Hx, etc.) ASSESSMENTS - Wound and Skin Assessment / Reassessment []  - Dermatologic /  Skin Assessment (not related to wound area) 0 ASSESSMENTS - Ostomy and/or Continence Assessment and Care []  - Incontinence Assessment and Management 0 []  - 0 Ostomy Care Assessment and Management (repouching, etc.) PROCESS - Coordination of Care []  - Simple Patient / Family Education for ongoing care 0 []  - 0 Complex (extensive) Patient / Family Education for ongoing care []  - 0 Staff obtains , Records, Test Results / Process Orders []  - 0 Staff telephones HHA, Nursing Homes / Clarify orders / etc []  - 0 Routine Transfer to another Facility (non-emergent condition) []  - 0 Routine Hospital Admission (non-emergent condition) []  - 0 New Admissions / / Ordering NPWT, Apligraf, etc. []  - 0 Emergency Hospital Admission (emergent condition) PROCESS - Special Needs []  - Pediatric / Minor Patient Management 0 []  - 0 Isolation Patient Management []  - 0 Hearing / Language / Visual special needs []  - 0 Assessment of Community assistance (transportation, D/C planning, etc.) []  - 0 Additional assistance / Altered mentation []  - 0 Support Surface(s) Assessment (bed, cushion, seat, etc.) INTERVENTIONS - Miscellaneous []  - External ear exam 0 []  - 0 Patient Transfer (multiple staff / / Similar devices) []  - 0 Simple Staple / Suture removal (25 or less) []  - 0 Complex Staple / Suture removal (26 or more) []  - 0 Hypo/Hyperglycemic Management (do not check if billed separately) []  - 0 Ankle / Brachial Index (ABI) - do not check if billed separately Has the patient been seen at the hospital within the last three years: Yes Total Score: 0 Level Of Care: ____ ( ) Electronic Signature(s) Signed: 11/29/2020 5:03:00 PM By: RN Entered By: on 11/29/2020 10:33:39 [name] [name] ( ) -------------------------------------------------------------------------------- Compression  Therapy Details Patient Name: Shammas, Miral R. Date of Service: 11/29/2020 10:00 AM Medical Record Number: Patient Account Number: Date of Birth/Sex: [birth year] (55 y.o. F) Treating RN: Primary Care Currie Dennin: SYSTEM, PCP Other Clinician: Referring Lovie Agresta: ,  PATRICK Treating Kawhi Diebold/Extender: Maxwell Caul Weeks in Treatment: 10 Compression Therapy Performed for Wound Assessment: Wound #2 Left,Proximal,Lateral Lower Leg Performed By: Clinician Rogers Blocker, RN Compression Type: Three Layer Pre Treatment ABI: 1.2 Post Procedure Diagnosis Same as Pre-procedure Electronic Signature(s) Signed: 11/29/2020 5:03:00 PM By: Rogers Blocker RN Entered By: Rogers Blocker on 11/29/2020 10:26:15 [name] [name] ([id number]) -------------------------------------------------------------------------------- Encounter Discharge Information Details Patient Name: Lichty, Emileigh R. Date of Service: 11/29/2020 10:00 AM Medical Record Number: [id number] Patient Account Number: [id number] Date of Birth/Sex: [birth year] (55 y.o. F) Treating RN: Rogers Blocker Primary Care Decorey Wahlert: SYSTEM, PCP Other Clinician: Referring Kamarri Fischetti: Willy Eddy Treating Maryrose Colvin/Extender: Altamese [place] in Treatment: 10 Encounter Discharge Information Items Post Procedure Vitals Discharge Condition: Stable Temperature (F): 98.4 Ambulatory Status: Walker Pulse (bpm): 79 Discharge Destination: Other (Note Required) Respiratory Rate (breaths/min): 16 Orders Sent: Yes Blood Pressure (mmHg): 156/91 Transportation: Private Auto Accompanied By: CAREGIVER Schedule Follow-up Appointment: Yes Clinical Summary of Care: Electronic Signature(s) Signed: 11/29/2020 5:03:00 PM By: Rogers Blocker RN Entered By: Rogers Blocker on 11/29/2020 10:39:19 Alvillar, Christie [name]  ([id number]) -------------------------------------------------------------------------------- Lower Extremity Assessment Details Patient Name: Beverley, Naomia R. Date of Service: 11/29/2020 10:00 AM Medical Record Number: [id number] Patient Account Number: [id number] Date of Birth/Sex: 01-04-1966 (55 y.o. F) Treating RN: Rogers Blocker Primary Care Margareth Kanner: SYSTEM, PCP Other Clinician: Referring Hendel Gatliff: Willy Eddy Treating Remijio Holleran/Extender: Maxwell Caul Weeks in Treatment: 10 Edema Assessment Assessed: [Left: Yes] [Right: No] Edema: [Left: Ye] [Right: s] Calf Left: Right: Point of Measurement: 29 cm From Medial Instep 30 cm Ankle Left: Right: Point of Measurement: 11 cm From Medial Instep 23 cm Vascular Assessment Pulses: Dorsalis Pedis Palpable: [Left:Yes] Electronic Signature(s) Signed: 11/29/2020 5:03:00 PM By: Rogers Blocker RN Entered By: Rogers Blocker on 11/29/2020 10:19:09 Ruberg, Christie [name] ([id number]) -------------------------------------------------------------------------------- Multi Wound Chart Details Patient Name: Carns, Quanita R. Date of Service: 11/29/2020 10:00 AM Medical Record Number: [id number] Patient Account Number: [id number] Date of Birth/Sex: [birth year] (55 y.o. 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 Comorbid History: Anemia, Asthma, Chronic Anemia, Asthma, Chronic N/A Obstructive  Pulmonary Disease Obstructive Pulmonary Disease (COPD), Type II Diabetes, (COPD), Type II Diabetes, Osteoarthritis Osteoarthritis Date Acquired: 08/30/2020 11/12/2020 N/A Weeks of Treatment: 10 2 N/A Wound Status: Healed - Epithelialized Open N/A Measurements L x W x D (cm) 0x0x0 1.6x0.7x0.1 N/A Area (cm) : 0 0.88 N/A Volume (cm) : 0 0.088 N/A % Reduction in Area: 100.00% 58.50% N/A % Reduction in Volume: 100.00% 58.50% N/A Classification: Full Thickness Without Exposed Grade 2 N/A Support Structures Exudate Amount: None Present Medium N/A Exudate Type: N/A Sanguinous N/A Exudate Color: N/A red N/A Wound Margin: Flat and Intact N/A N/A Granulation Amount: None Present (0%) Large (67-100%) N/A Granulation Quality: N/A Red, Pink N/A Necrotic Amount: None Present (0%) None Present (0%) N/A Exposed Structures: Fascia: No Fat Layer (Subcutaneous Tissue): N/A Fat Layer (Subcutaneous Tissue): Yes No Fascia: No Tendon: No Tendon: No Muscle: No Muscle: No Joint: No Joint: No Bone: No Bone: No Epithelialization: Large (67-100%) Medium (34-66%) N/A Debridement: N/A Debridement - Selective/Open N/A Wound Pre-procedure Verification/Time N/A  10:23 N/A Out Taken: Level: N/A Skin/Epidermis N/A Debridement Area (sq cm): N/A 0.5 N/A Instrument: N/A Forceps, Scissors N/A Bleeding: N/A Minimum N/A Hemostasis Achieved: N/A Pressure N/A N/A Procedure was tolerated well N/A Fantroy, Ela R. ([id number]) Debridement Treatment Response: Post Debridement N/A 1.6x1.2x0.1 N/A Measurements L x W x D (cm) Post Debridement Volume: N/A 0.151 N/A (cm) Procedures Performed: N/A Compression Therapy N/A Debridement Treatment Notes Electronic Signature(s) Signed: 11/29/2020 5:14:01 PM By: Baltazar Najjar MD Entered By: Baltazar Najjar on 11/29/2020 10:27:12 Sword, Christie [name] ([id number]) -------------------------------------------------------------------------------- Multi-Disciplinary Care  Plan Details Patient Name: Guthridge, Dariane R. Date of Service: 11/29/2020 10:00 AM Medical Record Number: [id number] Patient Account Number: [id number] Date of Birth/Sex: [birth year] (55 y.o. F) Treating RN: Rogers Blocker Primary Care Mattye Verdone: SYSTEM, PCP Other Clinician: Referring Walfred Bettendorf: Willy Eddy Treating Sahar Ryback/Extender: Altamese  in Treatment: 10 Active Inactive Abuse / Safety / Falls / Self Care Management Nursing Diagnoses: History of Falls Potential for falls Goals: Patient will not experience any injury related to falls Date Initiated: 09/20/2020 Target Resolution Date: 10/20/2020 Goal Status: Active Interventions: Podiatry chair, stretcher in low position and side rails up as needed Provide education on safe transfers Notes: Medication Nursing Diagnoses: Knowledge deficit related to medication safety: actual or potential Goals: Patient/caregiver will demonstrate understanding of all current medications Date Initiated: 09/20/2020 Target Resolution Date: 10/20/2020 Goal Status: Active Patient/caregiver will demonstrate understanding of new oral/IV medications prescribed at the [facility] (topical prescriptions are covered under the skin breakdown problem) Date Initiated: 09/20/2020 Target Resolution Date: 10/20/2020 Goal Status: Active Interventions: Assess for medication contraindications each visit where new medications are prescribed Patient/Caregiver given reconciled medication list upon admission, changes in medications and discharge from the Wound Center Notes: Pain, Acute or Chronic Nursing Diagnoses: Pain Management - Non-cyclic Chronic Pain Pain, acute or chronic: actual or potential Potential alteration in comfort, pain Goals: Patient will verbalize adequate pain control and receive pain control interventions during procedures as needed Date Initiated: 09/20/2020 Target Resolution Date: 10/20/2020 Goal Status: Active Interventions: Assess  comfort goal upon admission Treatment Activities: Administer pain control measures as ordered : 09/20/2020 Zirkelbach, Tuana R. ([id number]) Notes: Soft Tissue Infection Nursing Diagnoses: Impaired tissue integrity Knowledge deficit related to disease process and management Knowledge deficit related to home infection control: handwashing, handling of soiled dressings, supply storage Goals: Patient will remain free of wound infection Date Initiated: 09/20/2020 Target Resolution Date: 10/20/2020 Goal Status: Active Patient/caregiver will verbalize understanding of or measures to prevent infection and contamination in the home setting Date Initiated: 09/20/2020 Target Resolution Date: 10/20/2020 Goal Status: Active Signs and symptoms of infection will be recognized early to allow for prompt treatment Date Initiated: 09/20/2020 Target Resolution Date: 10/20/2020 Goal Status: Active Interventions: Assess signs and symptoms of infection every visit Provide education on infection Treatment Activities: Education provided on Infection : 11/15/2020 Notes: Venous Leg Ulcer Nursing Diagnoses: Actual venous Insuffiency (use after diagnosis is confirmed) Knowledge deficit related to disease process and management Potential for venous Insuffiency (use before diagnosis confirmed) Goals: Patient will maintain optimal edema control Date Initiated: 09/20/2020 Target Resolution Date: 10/20/2020 Goal Status: Active Interventions: Provide education on venous insufficiency Notes: Electronic Signature(s) Signed: 11/29/2020 5:03:00 PM By: Rogers Blocker RN Entered By: Rogers Blocker on 11/29/2020 10:23:34 Braun, Christie [name] ([id number]) -------------------------------------------------------------------------------- Pain Assessment Details Patient Name: Snellings, Autumnrose R. Date of Service: 11/29/2020 10:00 AM Medical Record Number: [id number] Patient Account Number: [id number] Date of Birth/Sex: 10-11-1965  (55 y.o. F) Treating RN: Rogers Blocker Primary Care Beata Beason:  SYSTEM, PCP Other Clinician: Referring Edris Friedt: Willy Eddy Treating Bethenny Losee/Extender: Maxwell Caul Weeks in Treatment: 10 Active Problems Location of Pain Severity and Description of Pain Patient Has Paino Yes Site Locations Rate the pain. Current Pain Level: 9 Pain Management and Medication Current Pain Management: Electronic Signature(s) Signed: 11/29/2020 5:03:00 PM By: Rogers Blocker RN Entered By: Rogers Blocker on 11/29/2020 10:10:58 Navis, Christie [name] ([id number]) -------------------------------------------------------------------------------- Patient/Caregiver Education Details Patient Name: Demers, Shanyn R. Date of Service: 11/29/2020 10:00 AM Medical Record Number: [id number] Patient Account Number: [id number] Date of Birth/Gender: [birth year] (55 y.o. F) Treating RN: Rogers Blocker Primary Care Physician: SYSTEM, PCP Other Clinician: Referring Physician: Willy Eddy Treating Physician/Extender: Altamese [place] in Treatment: 10 Education Assessment Education Provided To: Patient Education Topics Provided Wound/Skin Impairment: Methods: Explain/Verbal Responses: State content correctly Electronic Signature(s) Signed: 11/29/2020 5:03:00 PM By: Rogers Blocker RN Entered By: Rogers Blocker on 11/29/2020 10:38:35 Heinemann, Christie [name] ([id number]) -------------------------------------------------------------------------------- Wound Assessment Details Patient Name: Higley, Lisvet R. Date of Service: 11/29/2020 10:00 AM Medical Record Number: [id number] Patient Account Number: [id number] Date of Birth/Sex: [birth year] (55 y.o. F) Treating RN: Rogers Blocker Primary Care Lawrance Wiedemann: SYSTEM, PCP Other Clinician: Referring Sachin Ferencz: Willy Eddy Treating Pailynn Vahey/Extender: Maxwell Caul Weeks in Treatment: 10 Wound Status Wound Number: 1 Primary Trauma,  Other Etiology: Wound Location: Left, Midline, Anterior Lower Leg Wound Healed - Epithelialized Wounding Event: Trauma Status: Date Acquired: 08/30/2020 Comorbid Anemia, Asthma, Chronic Obstructive Pulmonary Disease Weeks Of Treatment: 10 History: (COPD), Type II Diabetes, Osteoarthritis Clustered Wound: No Photos Wound Measurements Length: (cm) 0 Width: (cm) 0 Depth: (cm) 0 Area: (cm) 0 Volume: (cm) 0 % Reduction in Area: 100% % Reduction in Volume: 100% Epithelialization: Large (67-100%) Tunneling: No Undermining: No Wound Description Classification: Full Thickness Without Exposed Support Structure Wound Margin: Flat and Intact Exudate Amount: None Present s Foul Odor After Cleansing: No Slough/Fibrino No Wound Bed Granulation Amount: None Present (0%) Exposed Structure Necrotic Amount: None Present (0%) Fascia Exposed: No Fat Layer (Subcutaneous Tissue) Exposed: No Tendon Exposed: No Muscle Exposed: No Joint Exposed: No Bone Exposed: No Electronic Signature(s) Signed: 11/29/2020 5:03:00 PM By: Rogers Blocker RN Entered By: Rogers Blocker on 11/29/2020 10:16:39 Wyszynski, Christie [name] ([id number]) -------------------------------------------------------------------------------- Wound Assessment Details Patient Name: Wegener, Cynthie R. Date of Service: 11/29/2020 10:00 AM Medical Record Number: [id number] Patient Account Number: [id number] Date of Birth/Sex: Jul 10, 1965 (55 y.o. F) Treating RN: Rogers Blocker Primary Care Lilygrace Rodick: SYSTEM, PCP Other Clinician: Referring Norman Bier: Willy Eddy Treating Raegen Tarpley/Extender: Maxwell Caul Weeks in Treatment: 10 Wound Status Wound Number: 2 Primary Diabetic Wound/Ulcer of the Lower Extremity Etiology: Wound Location: Left, Proximal, Lateral Lower Leg Wound Open Wounding Event: Trauma Status: Date Acquired: 11/12/2020 Comorbid Anemia, Asthma, Chronic Obstructive Pulmonary Disease Weeks Of Treatment:  2 History: (COPD), Type II Diabetes, Osteoarthritis Clustered Wound: No Photos Wound Measurements Length: (cm) 1.6 Width: (cm) 0.7 Depth: (cm) 0.1 Area: (cm) 0.88 Volume: (cm) 0.088 % Reduction in Area: 58.5% % Reduction in Volume: 58.5% Epithelialization: Medium (34-66%) Tunneling: No Undermining: No Wound Description Classification: Grade 2 Exudate Amount: Medium Exudate Type: Sanguinous Exudate Color: red Foul Odor After Cleansing: No Slough/Fibrino No Wound Bed Granulation Amount: Large (67-100%) Exposed Structure Granulation Quality: Red, Pink Fascia Exposed: No Necrotic Amount: None Present (0%) Fat Layer (Subcutaneous Tissue) Exposed: Yes Tendon Exposed: No Muscle Exposed: No Joint Exposed: No Bone Exposed: No Treatment Notes Wound #2 (Lower Leg) Wound Laterality: Left, Lateral, Proximal Cleanser Soap and Water Discharge Instruction: Gently cleanse wound with antibacterial soap,  rinse and pat dry prior to dressing wounds Peri-Wound Care Canty, MONIQUE JESPERSON. ([id number]) Topical Primary Dressing Hydrofera Blue Ready Transfer Foam, 2.5x2.5 (in/in) Discharge Instruction: Apply Hydrofera Blue Ready to wound bed as directed-cut hydrofera big enough to cover wound bed AND catch drainage. IF USING HYDROFERA CLASSIC, PLASTIC SIDE DOES NOT TOUCH WOUND BED. Please call office for clarification if needed. (EXTRA SENT WITH PATIENT TODAY) Secondary Dressing ABD Pad 5x9 (in/in) Discharge Instruction: Cover with ABD pad Secured With Compression Wrap Profore Lite LF 3 Multilayer Compression Bandaging System Discharge Instruction: Apply 3 multi-layer wrap as prescribed. Compression Stockings Add-Ons Electronic Signature(s) Signed: 11/29/2020 5:03:00 PM By: Rogers Blocker RN Entered By: Rogers Blocker on 11/29/2020 10:17:07 Bellmore, Christie [name] ([id number]) -------------------------------------------------------------------------------- Vitals Details Patient Name:  Dor, Laura-Lee R. Date of Service: 11/29/2020 10:00 AM Medical Record Number: [id number] Patient Account Number: [id number] Date of Birth/Sex: 11/13/1965 (55 y.o. F) Treating RN: Rogers Blocker Primary Care Maikayla Beggs: SYSTEM, PCP Other Clinician: Referring Decorian Schuenemann: Willy Eddy Treating Reynol Arnone/Extender: Altamese [place] in Treatment: 10 Vital Signs Time Taken: 10:07 Temperature (F): 98.4 Height (in): 68 Pulse (bpm): 79 Weight (lbs): 216 Respiratory Rate (breaths/min): 18 Body Mass Index (BMI): 32.8 Blood Pressure (mmHg): 156/91 Reference Range: 80 - 120 mg / dl Electronic Signature(s) Signed: 11/29/2020 5:03:00 PM By: Rogers Blocker RN Entered By: Rogers Blocker on 11/29/2020 10:09:16

## 2020-11-29 NOTE — Progress Notes (Signed)
[name] [name], [name] [name] ([id number]) Visit Report for 11/29/2020 Debridement Details Patient Name: [name] [name], [name] [name]. Date of Service: 11/29/2020 10:00 AM Medical Record Number: [id number] Patient Account Number: [id number] Date of Birth/Sex: 06/08/1965 (55 y.o. F) Treating RN: Rogers Blocker Primary Care Provider: SYSTEM, PCP Other Clinician: Referring Provider: Willy Eddy Treating Provider/Extender: Altamese [place] in Treatment: 10 Debridement Performed for Wound #2 Left,Proximal,Lateral Lower Leg Assessment: Performed By: Physician Maxwell Caul, MD Debridement Type: Debridement Severity of Tissue Pre Debridement: Fat layer exposed Level of Consciousness (Pre- Awake and Alert procedure): Pre-procedure Verification/Time Out Yes - 10:23 Taken: Start Time: 10:23 Total Area Debrided (L x W): 1 (cm) x 0.5 (cm) = 0.5 (cm) Tissue and other material Non-Viable, Skin: Epidermis debrided: Level: Skin/Epidermis Debridement Description: Selective/Open Wound Instrument: Forceps, Scissors Bleeding: Minimum Hemostasis Achieved: Pressure Response to Treatment: Procedure was tolerated well Level of Consciousness (Post- Awake and Alert procedure): Post Debridement Measurements of Total Wound Length: (cm) 1.6 Width: (cm) 1.2 Depth: (cm) 0.1 Volume: (cm) 0.151 Character of Wound/Ulcer Post Debridement: Stable Severity of Tissue Post Debridement: Fat layer exposed Post Procedure Diagnosis Same as Pre-procedure Electronic Signature(s) Signed: 11/29/2020 5:03:00 PM By: Rogers Blocker RN Signed: 11/29/2020 5:14:01 PM By: Baltazar Najjar MD Entered By: Baltazar Najjar on 11/29/2020 10:27:32 Roussell, [name] [name] ([id number]) -------------------------------------------------------------------------------- HPI Details Patient Name: [name] [name], [name] Date of Service: 11/29/2020 10:00 AM Medical Record Number: [id number] Patient Account Number: [id number] Date of  Birth/Sex: 1965-09-09 (55 y.o. F) Treating RN: Rogers Blocker Primary Care Provider: SYSTEM, PCP Other Clinician: Referring Provider: Willy Eddy Treating Provider/Extender: Altamese [place] in Treatment: 10 History of Present Illness HPI Description: 09/20/2020 upon evaluation today patient presents for initial inspection here in our clinic concerning issues that she has been having with a trauma wound with a skin tear on the anterior portion of her left leg after getting in a encounter with another resident at the facility at the [facility] Subsequently she is having quite a bit of pain according to what she is telling me today. There does not appear to be any signs of active infection which is great news. With that being said I do see some signs of hypergranulation so I believe there is a little bit too much moisture going on I am not sure what dressing wound they are using as all she had on was basically just a protective dressing today nothing really more. Either way I think that she would actually do well with [facility] which is probably the best option currently based on what I am seeing. The surface otherwise looks good just a little bit hypergranular. She does have a history of seemingly dementia along with some other psychiatric conditions in general. She does have diabetes mellitus type 2 as well. 10/11/2020 upon evaluation today patient appears to be doing decently well in regard to her wound. Fortunately there does not appear to be any signs of active infection which is great news. The biggest issue is that the [facility] was put on backwards. Unfortunately this was not the ready transfer and therefore has a back which does not allow any absorption this did not do too well for the patient to be perfectly honest. Nonetheless the wound does not look worse but there is still some hypergranulation I think that silver nitrate will be helpful today. 10/19/2020 upon  evaluation today patient appears to be doing well with regard to her wound. Overall I feel like this is significantly improved which is  great news. Nonetheless I feel like there is still some hypergranulation that I would like to try to trim down a little bit today with debridement and I discussed that with the patient she is in agreement with that. 11/01/2020 upon evaluation today patient appears to be doing well with regard to her wound on the leg. She has been tolerating the dressing changes without complication and fortunately there does not appear to be any signs of active infection systemically at this time which is great news. No fevers, chills, nausea, vomiting, or diarrhea. 9/26; patient has a new wound just lateral to the original wound on the upper left calf. His usual we have absolutely no history here probably an abrasion. She is supposed to have 3 layer compression and she has home health although she did not come in with any form of wrap on. Primary dressing by our records is [facility] 10/10; 2-week follow-up. The original wound on the upper left lower leg is closed however she had a new wound 2 weeks ago presumably trauma. We ordered Hydrofera Blue however the patient comes in this morning without any dressing on the wound and just the compression and the cotton may have stuck to the wound surface Electronic Signature(s) Signed: 11/29/2020 5:14:01 PM By: Baltazar Najjar MD Entered By: Baltazar Najjar on 11/29/2020 10:29:16 [name] [name], [name] [name] ([id number]) -------------------------------------------------------------------------------- Physical Exam Details Patient Name: [name] [name], [name] Date of Service: 11/29/2020 10:00 AM Medical Record Number: [id number] Patient Account Number: [id number] Date of Birth/Sex: 05-Nov-1965 (55 y.o. F) Treating RN: Rogers Blocker Primary Care Provider: SYSTEM, PCP Other Clinician: Referring Provider: Willy Eddy Treating  Provider/Extender: Maxwell Caul Weeks in Treatment: 10 Constitutional Patient is hypertensive.. Pulse regular and within target range for patient.Marland Kitchen Respirations regular, non-labored and within target range.. Temperature is normal and within the target range for the patient.Marland Kitchen appears in no distress. Notes Wound exam; her original wound is closed. On the left upper lateral wound just lateral to the tibia a very superficial surface. Medially and this there was a blister that I removed with pickups and scissors. No evidence of surrounding infection. Her edema control is fairly good Psychologist, [facility] probation services) Signed: 11/29/2020 5:14:01 PM By: Baltazar Najjar MD Entered By: Baltazar Najjar on 11/29/2020 10:30:08 [name] [name], [name] [name] ([id number]) -------------------------------------------------------------------------------- Physician Orders Details Patient Name: [name] [name], [name] Date of Service: 11/29/2020 10:00 AM Medical Record Number: [id number] Patient Account Number: [id number] Date of Birth/Sex: Aug 06, 1965 (55 y.o. F) Treating RN: Rogers Blocker Primary Care Provider: SYSTEM, PCP Other Clinician: Referring Provider: Willy Eddy Treating Provider/Extender: Altamese [place] in Treatment: 10 Verbal / Phone Orders: No Diagnosis Coding Follow-up Appointments o Return Appointment in 2 weeks. - [facility]-PLEASE SCHEDULE ACCORDINGLY AS PATIENT WILL NOT BE SEEN FOR 2 WEEKS o Nurse Visit as needed - call to set up if needed Home Health o Home Health Company: - [facility] o [facility] for wound care. May utilize formulary equivalent dressing for wound treatment orders unless otherwise specified. Home Health Nurse may visit PRN to address patientos wound care needs. o Scheduled days for dressing changes to be completed; exception, patient has scheduled wound care visit that day. o **Please direct any NON-WOUND related issues/requests for orders to  patient's Primary Care Physician. **If current dressing causes regression in wound condition, may D/C ordered dressing product/s and apply Normal Saline Moist Dressing daily until next Wound Healing Center or Other MD appointment. **Notify Wound Healing Center of regression in wound condition at [phone]. Bathing/ Shower/  Hygiene o May shower with wound dressing protected with water repellent cover or cast protector. - DO NOT GET COMPRESSION WRAP WET Anesthetic (Use 'Patient Medications' Section for Anesthetic Order Entry) o Lidocaine applied to wound bed Edema Control - Lymphedema / Segmental Compressive Device / Other o Optional: One layer of unna paste to top of compression wrap (to act as an anchor). Wound Treatment Wound #2 - Lower Leg Wound Laterality: Left, Lateral, Proximal Cleanser: Soap and Water 2 x Per Week/30 Days Discharge Instructions: Gently cleanse wound with antibacterial soap, rinse and pat dry prior to dressing wounds Primary Dressing: Hydrofera Blue Ready Transfer Foam, 2.5x2.5 (in/in) 2 x Per Week/30 Days Discharge Instructions: Apply Hydrofera Blue Ready to wound bed as directed-cut hydrofera big enough to cover wound bed AND catch drainage. IF USING HYDROFERA CLASSIC, PLASTIC SIDE DOES NOT TOUCH WOUND BED. Please call office for clarification if needed. (EXTRA SENT WITH PATIENT TODAY) Secondary Dressing: ABD Pad 5x9 (in/in) 2 x Per Week/30 Days Discharge Instructions: Cover with ABD pad Compression Wrap: Profore Lite LF 3 Multilayer Compression Bandaging System 2 x Per Week/30 Days Discharge Instructions: Apply 3 multi-layer wrap as prescribed. Electronic Signature(s) Signed: 11/29/2020 5:03:00 PM By: Rogers Blocker RN Signed: 11/29/2020 5:14:01 PM By: Baltazar Najjar MD Entered By: Rogers Blocker on 11/29/2020 10:34:09 [name] [name], [name] [name] ([id number]) -------------------------------------------------------------------------------- Problem List  Details Patient Name: [name] [name], [name] Date of Service: 11/29/2020 10:00 AM Medical Record Number: [id number] Patient Account Number: [id number] Date of Birth/Sex: 12/22/1965 (55 y.o. F) Treating RN: Rogers Blocker Primary Care Provider: SYSTEM, PCP Other Clinician: Referring Provider: Willy Eddy Treating Provider/Extender: Altamese [place] in Treatment: 10 Active Problems ICD-10 Encounter Code Description Active Date MDM Diagnosis S81.812A Laceration without foreign body, left lower leg, initial encounter 09/20/2020 No Yes L97.822 Non-pressure chronic ulcer of other part of left lower leg with fat layer 09/20/2020 No Yes exposed E11.622 Type 2 diabetes mellitus with other skin ulcer 09/20/2020 No Yes F01.50 Vascular dementia without behavioral disturbance 09/20/2020 No Yes Inactive Problems Resolved Problems Electronic Signature(s) Signed: 11/29/2020 5:14:01 PM By: Baltazar Najjar MD Entered By: Baltazar Najjar on 11/29/2020 10:26:54 [name] [name], [name] [name] ([id number]) -------------------------------------------------------------------------------- Progress Note Details Patient Name: [name] [name], [name] Date of Service: 11/29/2020 10:00 AM Medical Record Number: [id number] Patient Account Number: [id number] Date of Birth/Sex: [birth year] (55 y.o. F) Treating RN: Rogers Blocker Primary Care Provider: SYSTEM, PCP Other Clinician: Referring Provider: Willy Eddy Treating Provider/Extender: Maxwell Caul Weeks in Treatment: 10 Subjective History of Present Illness (HPI) 09/20/2020 upon evaluation today patient presents for initial inspection here in our clinic concerning issues that she has been having with a trauma wound with a skin tear on the anterior portion of her left leg after getting in a encounter with another resident at the facility at the [facility]. Subsequently she is having quite a bit of pain according to what she is telling me today. There does  not appear to be any signs of active infection which is great news. With that being said I do see some signs of hypergranulation so I believe there is a little bit too much moisture going on I am not sure what dressing wound they are using as all she had on was basically just a protective dressing today nothing really more. Either way I think that she would actually do well with [facility] which is probably the best option currently based on what I am seeing. The surface otherwise looks good just a little bit hypergranular.  She does have a history of seemingly dementia along with some other psychiatric conditions in general. She does have diabetes mellitus type 2 as well. 10/11/2020 upon evaluation today patient appears to be doing decently well in regard to her wound. Fortunately there does not appear to be any signs of active infection which is great news. The biggest issue is that the [facility] was put on backwards. Unfortunately this was not the ready transfer and therefore has a back which does not allow any absorption this did not do too well for the patient to be perfectly honest. Nonetheless the wound does not look worse but there is still some hypergranulation I think that silver nitrate will be helpful today. 10/19/2020 upon evaluation today patient appears to be doing well with regard to her wound. Overall I feel like this is significantly improved which is great news. Nonetheless I feel like there is still some hypergranulation that I would like to try to trim down a little bit today with debridement and I discussed that with the patient she is in agreement with that. 11/01/2020 upon evaluation today patient appears to be doing well with regard to her wound on the leg. She has been tolerating the dressing changes without complication and fortunately there does not appear to be any signs of active infection systemically at this time which is great news. No fevers, chills, nausea,  vomiting, or diarrhea. 9/26; patient has a new wound just lateral to the original wound on the upper left calf. His usual we have absolutely no history here probably an abrasion. She is supposed to have 3 layer compression and she has home health although she did not come in with any form of wrap on. Primary dressing by our records is [facility]. 10/10; 2-week follow-up. The original wound on the upper left lower leg is closed however she had a new wound 2 weeks ago presumably trauma. We ordered Hydrofera Blue however the patient comes in this morning without any dressing on the wound and just the compression and the cotton may have stuck to the wound surface Objective Constitutional Patient is hypertensive.. Pulse regular and within target range for patient.Marland Kitchen Respirations regular, non-labored and within target range.. Temperature is normal and within the target range for the patient.Marland Kitchen appears in no distress. Vitals Time Taken: 10:07 AM, Height: 68 in, Weight: 216 lbs, BMI: 32.8, Temperature: 98.4 F, Pulse: 79 bpm, Respiratory Rate: 18 breaths/min, Blood Pressure: 156/91 mmHg. General Notes: Wound exam; her original wound is closed. On the left upper lateral wound just lateral to the tibia a very superficial surface. Medially and this there was a blister that I removed with pickups and scissors. No evidence of surrounding infection. Her edema control is fairly good Integumentary (Hair, Skin) Wound #1 status is Healed - Epithelialized. Original cause of wound was Trauma. The date acquired was: 08/30/2020. The wound has been in treatment 10 weeks. The wound is located on the Left,Midline,Anterior Lower Leg. The wound measures 0cm length x 0cm width x 0cm depth; 0cm^2 area and 0cm^3 volume. There is no tunneling or undermining noted. There is a none present amount of drainage noted. The wound margin is flat and intact. There is no granulation within the wound bed. There is no necrotic tissue  within the wound bed. Wound #2 status is Open. Original cause of wound was Trauma. The date acquired was: 11/12/2020. The wound has been in treatment 2 weeks. The wound is located on the Left,Proximal,Lateral Lower Leg. The wound  measures 1.6cm length x 0.7cm width x 0.1cm depth; 0.88cm^2 area and 0.088cm^3 volume. There is Fat Layer (Subcutaneous Tissue) exposed. There is no tunneling or undermining noted. There is a medium amount of sanguinous drainage noted. There is large (67-100%) red, pink granulation within the wound bed. There is no necrotic tissue within the wound bed. [name] [name], [name] [name] ([id number]) Assessment Active Problems ICD-10 Laceration without foreign body, left lower leg, initial encounter Non-pressure chronic ulcer of other part of left lower leg with fat layer exposed Type 2 diabetes mellitus with other skin ulcer Vascular dementia without behavioral disturbance Procedures Wound #2 Pre-procedure diagnosis of Wound #2 is a Diabetic Wound/Ulcer of the Lower Extremity located on the Left,Proximal,Lateral Lower Leg .Severity of Tissue Pre Debridement is: Fat layer exposed. There was a Selective/Open Wound Skin/Epidermis Debridement with a total area of 0.5 sq cm performed by Maxwell Caul, MD. With the following instrument(s): Forceps, and Scissors to remove Non-Viable tissue/material. Material removed includes Skin: Epidermis. A time out was conducted at 10:23, prior to the start of the procedure. A Minimum amount of bleeding was controlled with Pressure. The procedure was tolerated well. Post Debridement Measurements: 1.6cm length x 1.2cm width x 0.1cm depth; 0.151cm^3 volume. Character of Wound/Ulcer Post Debridement is stable. Severity of Tissue Post Debridement is: Fat layer exposed. Post procedure Diagnosis Wound #2: Same as Pre-Procedure Pre-procedure diagnosis of Wound #2 is a Diabetic Wound/Ulcer of the Lower Extremity located on the Left,Proximal,Lateral Lower  Leg . There was a Three Layer Compression Therapy Procedure with a pre-treatment ABI of 1.2 by Rogers Blocker, RN. Post procedure Diagnosis Wound #2: Same as Pre-Procedure Plan Follow-up Appointments: Return Appointment in 2 weeks. - [facility]-PLEASE SCHEDULE ACCORDINGLY AS PATIENT WILL NOT BE SEEN FOR 2 WEEKS Nurse Visit as needed - call to set up if needed Home Health: Home Health Company: - [facility] for wound care. May utilize formulary equivalent dressing for wound treatment orders unless otherwise specified. Home Health Nurse may visit PRN to address patient s wound care needs. Scheduled days for dressing changes to be completed; exception, patient has scheduled wound care visit that day. **Please direct any NON-WOUND related issues/requests for orders to patient's Primary Care Physician. **If current dressing causes regression in wound condition, may D/C ordered dressing product/s and apply Normal Saline Moist Dressing daily until next Wound Healing Center or Other MD appointment. **Notify Wound Healing Center of regression in wound condition at [phone]. Bathing/ Shower/ Hygiene: May shower with wound dressing protected with water repellent cover or cast protector. - DO NOT GET COMPRESSION WRAP WET Anesthetic (Use 'Patient Medications' Section for Anesthetic Order Entry): Lidocaine applied to wound bed Edema Control - Lymphedema / Segmental Compressive Device / Other: Optional: One layer of unna paste to top of compression wrap (to act as an anchor). WOUND #2: - Lower Leg Wound Laterality: Left, Lateral, Proximal Cleanser: Soap and Water 2 x Per Week/30 Days Discharge Instructions: Gently cleanse wound with antibacterial soap, rinse and pat dry prior to dressing wounds Primary Dressing: Hydrofera Blue Ready Transfer Foam, 2.5x2.5 (in/in) 2 x Per Week/30 Days Discharge Instructions: Apply Hydrofera Blue Ready to wound bed as directed-cut hydrofera big  enough to cover wound bed AND catch drainage. IF USING HYDROFERA CLASSIC, PLASTIC SIDE DOES NOT TOUCH WOUND BED. Please call office for clarification if needed. Secondary Dressing: ABD Pad 5x9 (in/in) 2 x Per Week/30 Days Discharge Instructions: Cover with ABD pad Compression Wrap: Profore Lite LF 3 Multilayer Compression  Bandaging System 2 x Per Week/30 Days Discharge Instructions: Apply 3 multi-layer wrap as prescribed. 1. Continue Hydrofera Blue. We have given her the remanence of this for home health to change the dressing. 2. She came in with nothing in terms of her primary dressing on the wound only gauze and a wrap. Servello, SUHAVI LOUGHNANE ([id number]) Electronic Signature(s) Signed: 11/29/2020 5:14:01 PM By: Baltazar Najjar MD Entered By: Baltazar Najjar on 11/29/2020 10:30:36 [place]on, [name] [name] ([id number]) -------------------------------------------------------------------------------- SuperBill Details Patient Name: Springborn, Damilola R. Date of Service: 11/29/2020 Medical Record Number: [id number] Patient Account Number: [id number] Date of Birth/Sex: 06/24/1965 (56 y.o. F) Treating RN: Rogers Blocker Primary Care Provider: SYSTEM, PCP Other Clinician: Referring Provider: Willy Eddy Treating Provider/Extender: Maxwell Caul Weeks in Treatment: 10 Diagnosis Coding ICD-10 Codes Code Description [phone] Laceration without foreign body, left lower leg, initial encounter L97.822 Non-pressure chronic ulcer of other part of left lower leg with fat layer exposed E11.622 Type 2 diabetes mellitus with other skin ulcer F01.50 Vascular dementia without behavioral disturbance Facility Procedures CPT4 Code: [id number] Description: 97597 - DEBRIDE WOUND 1ST 20 SQ CM OR < Modifier: Quantity: 1 CPT4 Code: Description: ICD-10 Diagnosis Description L97.822 Non-pressure chronic ulcer of other part of left lower leg with fat layer S81.812A Laceration without foreign body, left lower leg,  initial encounter Modifier: exposed Quantity: Physician Procedures CPT4 Code: [id number] Description: 97597 - WC PHYS DEBR WO ANESTH 20 SQ CM Modifier: Quantity: 1 CPT4 Code: Description: ICD-10 Diagnosis Description L97.822 Non-pressure chronic ulcer of other part of left lower leg with fat layer S81.812A Laceration without foreign body, left lower leg, initial encounter Modifier: exposed Quantity: Electronic Signature(s) Signed: 11/29/2020 5:14:01 PM By: Baltazar Najjar MD Entered By: Baltazar Najjar on 11/29/2020 10:30:59

## 2020-12-13 ENCOUNTER — Other Ambulatory Visit: Payer: Self-pay

## 2020-12-13 ENCOUNTER — Encounter: Payer: Medicaid Other | Admitting: Physician Assistant

## 2020-12-13 DIAGNOSIS — E11622 Type 2 diabetes mellitus with other skin ulcer: Secondary | ICD-10-CM | POA: Diagnosis not present

## 2020-12-13 NOTE — Progress Notes (Addendum)
PEARL, BENTS ([id number]) Visit Report for 12/13/2020 Arrival Information Details Patient Name: [name] [name], [name] [name]. Date of Service: 12/13/2020 10:45 AM Medical Record Number: [id number] Patient Account Number: [id number] Date of Birth/Sex: [birth year] (55 y.o. F) Treating RN: Huel Coventry Primary Care Sahiba Granholm: SYSTEM, PCP Other Clinician: Referring Machelle Raybon: Willy Eddy Treating Janelie Goltz/Extender: [name] in Treatment: 12 Visit Information History Since Last Visit Added or deleted any medications: No Patient Arrived: Wheel Chair Has Dressing in Place as Prescribed: No Arrival Time: 10:57 Has Compression in Place as Prescribed: No Accompanied By: caregiver Pain Present Now: Yes Transfer Assistance: None Patient Requires Transmission-Based Precautions: No Patient Has Alerts: Yes Patient Alerts: Type II Diabetic Electronic Signature(s) Signed: 12/13/2020 4:30:36 PM By: Elliot Gurney, BSN, RN, CWS, Kim RN, BSN Entered By: Elliot Gurney, BSN, RN, CWS, Kim on 12/13/2020 10:58:40 [name], [name] ([id number]) -------------------------------------------------------------------------------- Clinic Level of Care Assessment Details Patient Name: [name], [name] Date of Service: 12/13/2020 10:45 AM Medical Record Number: [id number] Patient Account Number: [id number] Date of Birth/Sex: 03/28/1965 (55 y.o. F) Treating RN: Huel Coventry Primary Care Sabatino Williard: SYSTEM, PCP Other Clinician: Referring Shakela Donati: Willy Eddy Treating Lutisha Knoche/Extender: [name] in Treatment: 12 Clinic Level of Care Assessment Items TOOL 4 Quantity Score []  - Use when only an EandM is performed on FOLLOW-UP visit 0 ASSESSMENTS - Nursing Assessment / Reassessment X - Reassessment of Co-morbidities (includes updates in patient status) 1 10 X- 1 5 Reassessment of Adherence to Treatment Plan ASSESSMENTS - Wound and Skin Assessment / Reassessment X - Simple Wound Assessment / Reassessment -  one wound 1 5 []  - 0 Complex Wound Assessment / Reassessment - multiple wounds []  - 0 Dermatologic / Skin Assessment (not related to wound area) ASSESSMENTS - Focused Assessment []  - Circumferential Edema Measurements - multi extremities 0 []  - 0 Nutritional Assessment / Counseling / Intervention []  - 0 Lower Extremity Assessment (monofilament, tuning fork, pulses) []  - 0 Peripheral Arterial Disease Assessment (using hand held doppler) ASSESSMENTS - Ostomy and/or Continence Assessment and Care []  - Incontinence Assessment and Management 0 []  - 0 Ostomy Care Assessment and Management (repouching, etc.) PROCESS - Coordination of Care X - Simple Patient / Family Education for ongoing care 1 15 []  - 0 Complex (extensive) Patient / Family Education for ongoing care X- 1 10 Staff obtains , Records, Test Results / Process Orders []  - 0 Staff telephones HHA, Nursing Homes / Clarify orders / etc []  - 0 Routine Transfer to another Facility (non-emergent condition) []  - 0 Routine Hospital Admission (non-emergent condition) []  - 0 New Admissions / / Ordering NPWT, Apligraf, etc. []  - 0 Emergency Hospital Admission (emergent condition) X- 1 10 Simple Discharge Coordination []  - 0 Complex (extensive) Discharge Coordination PROCESS - Special Needs []  - Pediatric / Minor Patient Management 0 []  - 0 Isolation Patient Management []  - 0 Hearing / Language / Visual special needs []  - 0 Assessment of Community assistance (transportation, D/C planning, etc.) []  - 0 Additional assistance / Altered mentation []  - 0 Support Surface(s) Assessment (bed, cushion, seat, etc.) INTERVENTIONS - Wound Cleansing / Measurement [name], [name] ( ) X- 1 5 Simple Wound Cleansing - one wound []  - 0 Complex Wound Cleansing - multiple wounds X- 1 5 Wound Imaging (photographs - any number of wounds) []  - 0 Wound Tracing (instead of photographs) []  -  0 Simple Wound Measurement - one wound []  - 0 Complex Wound Measurement - multiple wounds INTERVENTIONS - Wound Dressings []  - Small Wound  Dressing one or multiple wounds 0 []  - 0 Medium Wound Dressing one or multiple wounds []  - 0 Large Wound Dressing one or multiple wounds []  - 0 Application of Medications - topical []  - 0 Application of Medications - injection INTERVENTIONS - Miscellaneous []  - External ear exam 0 []  - 0 Specimen Collection (cultures, biopsies, blood, body fluids, etc.) []  - 0 Specimen(s) / Culture(s) sent or taken to Lab for analysis []  - 0 Patient Transfer (multiple staff / / Similar devices) []  - 0 Simple Staple / Suture removal (25 or less) []  - 0 Complex Staple / Suture removal (26 or more) []  - 0 Hypo / Hyperglycemic Management (close monitor of Blood Glucose) []  - 0 Ankle / Brachial Index (ABI) - do not check if billed separately X- 1 5 Vital Signs Has the patient been seen at the hospital within the last three years: Yes Total Score: 70 Level Of Care: New/Established - Level 2 Electronic Signature(s) Signed: 12/13/2020 4:30:36 PM By: , BSN, RN, CWS, Kim RN, BSN Entered By: , BSN, RN, CWS, Kim on 12/13/2020 11:08:07 [name], ( ) -------------------------------------------------------------------------------- Encounter Discharge Information Details Patient Name: [name], [name] Date of Service: 12/13/2020 10:45 AM Medical Record Number: Nurse, adult Patient Account Number: Date of Birth/Sex: 04/01/1965 (55 y.o. F) Treating RN: Primary Care Sajjad Honea: SYSTEM, PCP Other Clinician: Referring Berlinda Farve: 12/15/2020 Treating Trevontae Lindahl/Extender: Elliot Gurney in Treatment: 12 Encounter Discharge Information Items Discharge Condition: Stable Ambulatory Status: Wheelchair Discharge Destination: Home Transportation: Private Auto Accompanied By: caregiveer Schedule Follow-up  Appointment: No Clinical Summary of Care: Electronic Signature(s) Signed: 12/13/2020 4:30:36 PM By: 12/15/2020, BSN, RN, CWS, Kim RN, BSN Entered By: [name], BSN, RN, CWS, Kim on 12/13/2020 11:11:39 Defranco, 12/15/2020 ([id number]) -------------------------------------------------------------------------------- Lower Extremity Assessment Details Patient Name: [name], [name] Date of Service: 12/13/2020 10:45 AM Medical Record Number: 02/06/1966 Patient Account Number: [id number] Date of Birth/Sex: [birth year] (55 y.o. F) Treating RN: [name] Primary Care Yanni Quiroa: SYSTEM, PCP Other Clinician: Referring Duard Spiewak: 12/15/2020 Treating Srikar Chiang/Extender: Elliot Gurney in Treatment: 12 Edema Assessment Assessed: [Left: Yes] [Right: No] Edema: [Left: Ye] [Right: s] Calf Left: Right: Point of Measurement: 29 cm From Medial Instep 34 cm Ankle Left: Right: Point of Measurement: 11 cm From Medial Instep 27 cm Vascular Assessment Pulses: Dorsalis Pedis Palpable: [Left:Yes] Electronic Signature(s) Signed: 12/13/2020 4:30:36 PM By: 12/15/2020, BSN, RN, CWS, Kim RN, BSN Entered By: [name], BSN, RN, CWS, Kim on 12/13/2020 11:06:20 Pilot, 12/15/2020 ([id number]) -------------------------------------------------------------------------------- Multi Wound Chart Details Patient Name: [name], [name] Date of Service: 12/13/2020 10:45 AM Medical Record Number: 02/06/1966 Patient Account Number: [id number] Date of Birth/Sex: [birth year] (55 y.o. F) Treating RN: [name] Primary Care Delayla Hoffmaster: SYSTEM, PCP Other Clinician: Referring Novah Nessel: 12/15/2020 Treating [place] Seaborn/Extender: Elliot Gurney in Treatment: 12 Vital Signs Height(in): 68 Pulse(bpm): 78 Weight(lbs): 216 Blood Pressure(mmHg): 127/74 Body Mass Index(BMI): 33 Temperature(F): 98.0 Respiratory Rate(breaths/min): 16 Photos: [2:No Photos] [N/A:N/A] Wound Location: [2:Left, Proximal, Lateral Lower Leg]  [N/A:N/A] Wounding Event: [2:Trauma] [N/A:N/A] Primary Etiology: [2:Diabetic Wound/Ulcer of the Lower Extremity] [N/A:N/A] Comorbid History: [2:Anemia, Asthma, Chronic Obstructive Pulmonary Disease (COPD), Type II Diabetes, Osteoarthritis] [N/A:N/A] Date Acquired: [2:11/12/2020] [N/A:N/A] Weeks of Treatment: [2:4] [N/A:N/A] Wound Status: [2:Healed - Epithelialized] [N/A:N/A] Measurements L x W x D (cm) [2:0x0x0] [N/A:N/A] Area (cm) : [2:0] [N/A:N/A] Volume (cm) : [2:0] [N/A:N/A] % Reduction in Area: [2:100.00%] [N/A:N/A] % Reduction in Volume: [2:100.00%] [N/A:N/A] Classification: [2:Grade 2] [N/A:N/A] Exudate Amount: [2:Medium] [N/A:N/A] Exudate  Type: [2:Sanguinous] [N/A:N/A] Exudate Color: [2:red] [N/A:N/A] Granulation Amount: [2:None Present (0%)] [N/A:N/A] Necrotic Amount: [2:None Present (0%)] [N/A:N/A] Exposed Structures: [2:Fascia: No Fat Layer (Subcutaneous Tissue): No Tendon: No Muscle: No Joint: No Bone: No Large (67-100%)] [N/A:N/A N/A] Treatment Notes Electronic Signature(s) Signed: 12/13/2020 4:30:36 PM By: Elliot Gurney, BSN, RN, CWS, Kim RN, BSN Entered By: Elliot Gurney, BSN, RN, CWS, Kim on 12/13/2020 11:06:51 Weidmann, [name] ([id number]) -------------------------------------------------------------------------------- Multi-Disciplinary Care Plan Details Patient Name: [name], [name] Date of Service: 12/13/2020 10:45 AM Medical Record Number: [id number] Patient Account Number: [id number] Date of Birth/Sex: Oct 11, 1965 (55 y.o. F) Treating RN: Huel Coventry Primary Care Jd Mccaster: SYSTEM, PCP Other Clinician: Referring Aydeen Blume: Willy Eddy Treating Persis Graffius/Extender: [name] in Treatment: 12 Active Inactive Electronic Signature(s) Signed: 12/13/2020 4:30:36 PM By: Elliot Gurney, BSN, RN, CWS, Kim RN, BSN Entered By: Elliot Gurney, BSN, RN, CWS, Kim on 12/13/2020 11:06:44 Gust, [name]  ([id number]) -------------------------------------------------------------------------------- Pain Assessment Details Patient Name: [name], [name] Date of Service: 12/13/2020 10:45 AM Medical Record Number: [id number] Patient Account Number: [id number] Date of Birth/Sex: 17-May-1965 (55 y.o. F) Treating RN: Huel Coventry Primary Care Pollie Poma: SYSTEM, PCP Other Clinician: Referring Colby Reels: Willy Eddy Treating Berman Grainger/Extender: [name] in Treatment: 12 Active Problems Location of Pain Severity and Description of Pain Patient Has Paino Yes Site Locations Pain Location: Pain in Ulcers Pain Management and Medication Current Pain Management: Electronic Signature(s) Signed: 12/13/2020 4:30:36 PM By: Elliot Gurney, BSN, RN, CWS, Kim RN, BSN Entered By: Elliot Gurney, BSN, RN, CWS, Kim on 12/13/2020 11:01:45 Mcroberts, [name] ([id number]) -------------------------------------------------------------------------------- Wound Assessment Details Patient Name: [name], [name] Date of Service: 12/13/2020 10:45 AM Medical Record Number: [id number] Patient Account Number: [id number] Date of Birth/Sex: 1965/12/27 (55 y.o. F) Treating RN: Huel Coventry Primary Care Trenell Moxey: SYSTEM, PCP Other Clinician: Referring Paiton Boultinghouse: Willy Eddy Treating Alaena Strader/Extender: [name] in Treatment: 12 Wound Status Wound Number: 2 Primary Diabetic Wound/Ulcer of the Lower Extremity Etiology: Wound Location: Left, Proximal, Lateral Lower Leg Wound Healed - Epithelialized Wounding Event: Trauma Status: Date Acquired: 11/12/2020 Comorbid Anemia, Asthma, Chronic Obstructive Pulmonary Disease Weeks Of Treatment: 4 History: (COPD), Type II Diabetes, Osteoarthritis Clustered Wound: No Wound Measurements Length: (cm) 0 Width: (cm) 0 Depth: (cm) 0 Area: (cm) 0 Volume: (cm) 0 % Reduction in Area: 100% % Reduction in Volume: 100% Epithelialization: Large (67-100%) Wound  Description Classification: Grade 2 Exudate Amount: Medium Exudate Type: Sanguinous Exudate Color: red Foul Odor After Cleansing: No Slough/Fibrino No Wound Bed Granulation Amount: None Present (0%) Exposed Structure Necrotic Amount: None Present (0%) Fascia Exposed: No Fat Layer (Subcutaneous Tissue) Exposed: No Tendon Exposed: No Muscle Exposed: No Joint Exposed: No Bone Exposed: No Treatment Notes Wound #2 (Lower Leg) Wound Laterality: Left, Lateral, Proximal Cleanser Peri-Wound Care Topical Primary Dressing Secondary Dressing Secured With Compression Wrap Compression Stockings Add-Ons Electronic Signature(s) Signed: 12/13/2020 4:30:36 PM By: Elliot Gurney, BSN, RN, CWS, Kim RN, BSN Entered By: Elliot Gurney, BSN, RN, CWS, Kim on 12/13/2020 11:04:41 Shakir, [name] ([id number]) -------------------------------------------------------------------------------- Vitals Details Patient Name: [name], [name] Date of Service: 12/13/2020 10:45 AM Medical Record Number: [id number] Patient Account Number: [id number] Date of Birth/Sex: [birth year] (55 y.o. F) Treating RN: Huel Coventry Primary Care Carlita Whitcomb: SYSTEM, PCP Other Clinician: Referring Liliah Dorian: Willy Eddy Treating Malani Lees/Extender: [name] in Treatment: 12 Vital Signs Time Taken: 11:01 Temperature (F): 98.0 Height (in): 68 Pulse (bpm): 78 Weight (lbs): 216 Respiratory Rate (breaths/min): 16 Body Mass Index (BMI): 32.8 Blood Pressure (mmHg): 127/74 Reference Range: 80 - 120 mg / dl Electronic  Signature(s) Signed: 12/13/2020 4:30:36 PM By: Elliot Gurney, BSN, RN, CWS, Kim RN, BSN Entered By: Elliot Gurney, BSN, RN, CWS, Kim on 12/13/2020 11:01:17

## 2020-12-13 NOTE — Progress Notes (Addendum)
JAZAYAH, ROETTGER ([id number]) Visit Report for 12/13/2020 Chief Complaint Document Details Patient Name: [name] [name], [name] [name]. Date of Service: 12/13/2020 10:45 AM Medical Record Number: [id number] Patient Account Number: [id number] Date of Birth/Sex: Nov 19, 1965 (55 y.o. F) Treating RN: Huel Coventry Primary Care Provider: SYSTEM, PCP Other Clinician: Referring Provider: Willy Eddy Treating Provider/Extender: [name] in Treatment: 12 Information Obtained from: Patient Chief Complaint Left leg traumatic ulcer Electronic Signature(s) Signed: 12/13/2020 10:54:45 AM By: Lenda Kelp PA-C Entered By: Lenda Kelp on 12/13/2020 10:54:45 Bechtol, [name] [name] ([id number]) -------------------------------------------------------------------------------- HPI Details Patient Name: [name] [name], [name] Date of Service: 12/13/2020 10:45 AM Medical Record Number: [id number] Patient Account Number: [id number] Date of Birth/Sex: 11-12-1965 (55 y.o. F) Treating RN: Huel Coventry Primary Care Provider: SYSTEM, PCP Other Clinician: Referring Provider: Willy Eddy Treating Provider/Extender: [name] in Treatment: 12 History of Present Illness HPI Description: 09/20/2020 upon evaluation today patient presents for initial inspection here in our clinic concerning issues that she has been having with a trauma wound with a skin tear on the anterior portion of her left leg after getting in a encounter with another resident at the facility at the [facility]. Subsequently she is having quite a bit of pain according to what she is telling me today. There does not appear to be any signs of active infection which is great news. With that being said I do see some signs of hypergranulation so I believe there is a little bit too much moisture going on I am not sure what dressing wound they are using as all she had on was basically just a protective dressing today nothing really more.  Either way I think that she would actually do well with [facility] which is probably the best option currently based on what I am seeing. The surface otherwise looks good just a little bit hypergranular. She does have a history of seemingly dementia along with some other psychiatric conditions in general. She does have diabetes mellitus type 2 as well. 10/11/2020 upon evaluation today patient appears to be doing decently well in regard to her wound. Fortunately there does not appear to be any signs of active infection which is great news. The biggest issue is that the [facility] was put on backwards. Unfortunately this was not the ready transfer and therefore has a back which does not allow any absorption this did not do too well for the patient to be perfectly honest. Nonetheless the wound does not look worse but there is still some hypergranulation I think that silver nitrate will be helpful today. 10/19/2020 upon evaluation today patient appears to be doing well with regard to her wound. Overall I feel like this is significantly improved which is great news. Nonetheless I feel like there is still some hypergranulation that I would like to try to trim down a little bit today with debridement and I discussed that with the patient she is in agreement with that. 11/01/2020 upon evaluation today patient appears to be doing well with regard to her wound on the leg. She has been tolerating the dressing changes without complication and fortunately there does not appear to be any signs of active infection systemically at this time which is great news. No fevers, chills, nausea, vomiting, or diarrhea. 9/26; patient has a new wound just lateral to the original wound on the upper left calf. His usual we have absolutely no history here probably an abrasion. She is supposed to have 3 layer compression and  she has home health although she did not come in with any form of wrap on. Primary dressing by our  records is [facility]. 10/10; 2-week follow-up. The original wound on the upper left lower leg is closed however she had a new wound 2 weeks ago presumably trauma. We ordered Hydrofera Blue however the patient comes in this morning without any dressing on the wound and just the compression and the cotton may have stuck to the wound surface 12/09/2020 upon evaluation today patient's leg ulcer actually showed signs of complete epithelization and appears to be healed other than some area where she scratched but this still has not an open wound at this point. Fortunately there does not appear to be any evidence of active infection at this time. Electronic Signature(s) Signed: 12/13/2020 2:13:00 PM By: Lenda Kelp PA-C Entered By: Lenda Kelp on 12/13/2020 14:13:00 Bilger, [name] [name] ([id number]) -------------------------------------------------------------------------------- Physical Exam Details Patient Name: [name] [name], [name] Date of Service: 12/13/2020 10:45 AM Medical Record Number: [id number] Patient Account Number: [id number] Date of Birth/Sex: 09-06-1965 (55 y.o. F) Treating RN: Huel Coventry Primary Care Provider: SYSTEM, PCP Other Clinician: Referring Provider: Willy Eddy Treating Provider/Extender: [name] in Treatment: 12 Constitutional Well-nourished and well-hydrated in no acute distress. Respiratory normal breathing without difficulty. Psychiatric this patient is able to make decisions and demonstrates good insight into disease process. Alert and Oriented x 3. pleasant and cooperative. Notes Patient's wound bed showed signs of complete epithelization and overall seems to be doing quite well. I do not see any evidence of infection currently which is great news. She still complains of pain but this is an ongoing and normal experience for her unfortunately. I am not sure if it is neuropathy or something else going on but nonetheless she has done well  with regard to healing and the wound is closed. Electronic Signature(s) Signed: 12/13/2020 2:13:25 PM By: Lenda Kelp PA-C Entered By: Lenda Kelp on 12/13/2020 14:13:25 Bunting, [name] [name] ([id number]) -------------------------------------------------------------------------------- Physician Orders Details Patient Name: [name] [name], [name] Date of Service: 12/13/2020 10:45 AM Medical Record Number: [id number] Patient Account Number: [id number] Date of Birth/Sex: 07-Feb-1966 (55 y.o. F) Treating RN: Huel Coventry Primary Care Provider: SYSTEM, PCP Other Clinician: Referring Provider: Willy Eddy Treating Provider/Extender: [name] in Treatment: 12 Verbal / Phone Orders: No Diagnosis Coding ICD-10 Coding Code Description [phone] Laceration without foreign body, left lower leg, initial encounter L97.822 Non-pressure chronic ulcer of other part of left lower leg with fat layer exposed E11.622 Type 2 diabetes mellitus with other skin ulcer F01.50 Vascular dementia without behavioral disturbance Discharge From [facility] Services o Discharge from Wound Care Center Treatment Complete o Moisturize legs daily after removing compression garments. o Elevate, Exercise Daily and Avoid Standing for Long Periods of Time. Electronic Signature(s) Signed: 12/13/2020 4:30:36 PM By: Elliot Gurney, BSN, RN, CWS, Kim RN, BSN Signed: 12/13/2020 4:31:19 PM By: Lenda Kelp PA-C Entered By: Elliot Gurney, BSN, RN, CWS, Kim on 12/13/2020 11:07:33 Cerra, [name] [name] ([id number]) -------------------------------------------------------------------------------- Problem List Details Patient Name: [name] [name], [name] Date of Service: 12/13/2020 10:45 AM Medical Record Number: [id number] Patient Account Number: [id number] Date of Birth/Sex: 1965-09-26 (55 y.o. F) Treating RN: Huel Coventry Primary Care Provider: SYSTEM, PCP Other Clinician: Referring Provider: Willy Eddy Treating Provider/Extender:  [name] in Treatment: 12 Active Problems ICD-10 Encounter Code Description Active Date MDM Diagnosis S81.812A Laceration without foreign body, left lower leg, initial encounter 09/20/2020 No Yes L97.822 Non-pressure chronic ulcer of other part of  left lower leg with fat layer 09/20/2020 No Yes exposed E11.622 Type 2 diabetes mellitus with other skin ulcer 09/20/2020 No Yes F01.50 Vascular dementia without behavioral disturbance 09/20/2020 No Yes Inactive Problems Resolved Problems Electronic Signature(s) Signed: 12/13/2020 10:54:40 AM By: Lenda Kelp PA-C Entered By: Lenda Kelp on 12/13/2020 10:54:39 [name] [name], [name] [name] ([id number]) -------------------------------------------------------------------------------- Progress Note Details Patient Name: [name] [name], [name] Date of Service: 12/13/2020 10:45 AM Medical Record Number: [id number] Patient Account Number: [id number] Date of Birth/Sex: 1965-12-13 (55 y.o. F) Treating RN: Huel Coventry Primary Care Provider: SYSTEM, PCP Other Clinician: Referring Provider: Willy Eddy Treating Provider/Extender: [name] in Treatment: 12 Subjective Chief Complaint Information obtained from Patient Left leg traumatic ulcer History of Present Illness (HPI) 09/20/2020 upon evaluation today patient presents for initial inspection here in our clinic concerning issues that she has been having with a trauma wound with a skin tear on the anterior portion of her left leg after getting in a encounter with another resident at the facility at the [facility]. Subsequently she is having quite a bit of pain according to what she is telling me today. There does not appear to be any signs of active infection which is great news. With that being said I do see some signs of hypergranulation so I believe there is a little bit too much moisture going on I am not sure what dressing wound they are using as all she had on was basically just  a protective dressing today nothing really more. Either way I think that she would actually do well with [facility] which is probably the best option currently based on what I am seeing. The surface otherwise looks good just a little bit hypergranular. She does have a history of seemingly dementia along with some other psychiatric conditions in general. She does have diabetes mellitus type 2 as well. 10/11/2020 upon evaluation today patient appears to be doing decently well in regard to her wound. Fortunately there does not appear to be any signs of active infection which is great news. The biggest issue is that the [facility] was put on backwards. Unfortunately this was not the ready transfer and therefore has a back which does not allow any absorption this did not do too well for the patient to be perfectly honest. Nonetheless the wound does not look worse but there is still some hypergranulation I think that silver nitrate will be helpful today. 10/19/2020 upon evaluation today patient appears to be doing well with regard to her wound. Overall I feel like this is significantly improved which is great news. Nonetheless I feel like there is still some hypergranulation that I would like to try to trim down a little bit today with debridement and I discussed that with the patient she is in agreement with that. 11/01/2020 upon evaluation today patient appears to be doing well with regard to her wound on the leg. She has been tolerating the dressing changes without complication and fortunately there does not appear to be any signs of active infection systemically at this time which is great news. No fevers, chills, nausea, vomiting, or diarrhea. 9/26; patient has a new wound just lateral to the original wound on the upper left calf. His usual we have absolutely no history here probably an abrasion. She is supposed to have 3 layer compression and she has home health although she did not come in with  any form of wrap on. Primary dressing by our records is [facility].  10/10; 2-week follow-up. The original wound on the upper left lower leg is closed however she had a new wound 2 weeks ago presumably trauma. We ordered Hydrofera Blue however the patient comes in this morning without any dressing on the wound and just the compression and the cotton may have stuck to the wound surface 12/09/2020 upon evaluation today patient's leg ulcer actually showed signs of complete epithelization and appears to be healed other than some area where she scratched but this still has not an open wound at this point. Fortunately there does not appear to be any evidence of active infection at this time. Objective Constitutional Well-nourished and well-hydrated in no acute distress. Vitals Time Taken: 11:01 AM, Height: 68 in, Weight: 216 lbs, BMI: 32.8, Temperature: 98.0 F, Pulse: 78 bpm, Respiratory Rate: 16 breaths/min, Blood Pressure: 127/74 mmHg. Respiratory normal breathing without difficulty. Psychiatric this patient is able to make decisions and demonstrates good insight into disease process. Alert and Oriented x 3. pleasant and cooperative. General Notes: Patient's wound bed showed signs of complete epithelization and overall seems to be doing quite well. I do not see any evidence of infection currently which is great news. She still complains of pain but this is an ongoing and normal experience for her unfortunately. I am not [name] [name], [name] ([id number]) sure if it is neuropathy or something else going on but nonetheless she has done well with regard to healing and the wound is closed. Integumentary (Hair, Skin) Wound #2 status is Healed - Epithelialized. Original cause of wound was Trauma. The date acquired was: 11/12/2020. The wound has been in treatment 4 weeks. The wound is located on the Left,Proximal,Lateral Lower Leg. The wound measures 0cm length x 0cm width x 0cm depth; 0cm^2 area and  0cm^3 volume. There is a medium amount of sanguinous drainage noted. There is no granulation within the wound bed. There is no necrotic tissue within the wound bed. Assessment Active Problems ICD-10 Laceration without foreign body, left lower leg, initial encounter Non-pressure chronic ulcer of other part of left lower leg with fat layer exposed Type 2 diabetes mellitus with other skin ulcer Vascular dementia without behavioral disturbance Plan Discharge From [facility] Services: Discharge from Wound Care Center Treatment Complete Moisturize legs daily after removing compression garments. Elevate, Exercise Daily and Avoid Standing for Long Periods of Time. 1. Would recommend that we discontinue wound care services at this time as the patient appears to be completely healed and this is great news. 2. I am also can recommend that the patient should have lotion applied to the leg at least twice a day to keep it nice and moist as far as the wound healing region is concerned. See patient back for follow-up visit as needed. Electronic Signature(s) Signed: 12/13/2020 2:14:37 PM By: Lenda Kelp PA-C Entered By: Lenda Kelp on 12/13/2020 14:14:37 [name] [name], [name] [name] ([id number]) -------------------------------------------------------------------------------- SuperBill Details Patient Name: [name] [name], [name] Date of Service: 12/13/2020 Medical Record Number: [id number] Patient Account Number: [id number] Date of Birth/Sex: 1965-08-05 (55 y.o. F) Treating RN: Huel Coventry Primary Care Provider: SYSTEM, PCP Other Clinician: Referring Provider: Willy Eddy Treating Provider/Extender: [name] in Treatment: 12 Diagnosis Coding ICD-10 Codes Code Description [phone] Laceration without foreign body, left lower leg, initial encounter L97.822 Non-pressure chronic ulcer of other part of left lower leg with fat layer exposed E11.622 Type 2 diabetes mellitus with other skin  ulcer F01.50 Vascular dementia without behavioral disturbance Facility Procedures CPT4 Code: [id number] Description: [phone] - WOUND CARE  VISIT-LEV 2 EST PT Modifier: Quantity: 1 Physician Procedures CPT4 Code: [id number] Description: 99213 - WC PHYS LEVEL 3 - EST PT Modifier: Quantity: 1 CPT4 Code: Description: ICD-10 Diagnosis Description S81.812A Laceration without foreign body, left lower leg, initial encounter L97.822 Non-pressure chronic ulcer of other part of left lower leg with fat lay E11.622 Type 2 diabetes mellitus with other skin ulcer  F01.50 Vascular dementia without behavioral disturbance Modifier: er exposed Quantity: Electronic Signature(s) Signed: 12/13/2020 2:14:51 PM By: Lenda Kelp PA-C Entered By: Lenda Kelp on 12/13/2020 14:14:51

## 2021-01-20 ENCOUNTER — Emergency Department: Payer: Medicaid Other

## 2021-01-20 ENCOUNTER — Other Ambulatory Visit: Payer: Self-pay

## 2021-01-20 ENCOUNTER — Emergency Department
Admission: EM | Admit: 2021-01-20 | Discharge: 2021-01-20 | Disposition: A | Discharge status: Home / Self Care | Payer: Medicaid Other | Attending: Emergency Medicine | Admitting: Emergency Medicine

## 2021-01-20 DIAGNOSIS — Z7982 Long term (current) use of aspirin: Secondary | ICD-10-CM | POA: Insufficient documentation

## 2021-01-20 DIAGNOSIS — Z79899 Other long term (current) drug therapy: Secondary | ICD-10-CM | POA: Diagnosis not present

## 2021-01-20 DIAGNOSIS — Z91040 Latex allergy status: Secondary | ICD-10-CM | POA: Diagnosis not present

## 2021-01-20 DIAGNOSIS — I129 Hypertensive chronic kidney disease with stage 1 through stage 4 chronic kidney disease, or unspecified chronic kidney disease: Secondary | ICD-10-CM | POA: Insufficient documentation

## 2021-01-20 DIAGNOSIS — F17210 Nicotine dependence, cigarettes, uncomplicated: Secondary | ICD-10-CM | POA: Insufficient documentation

## 2021-01-20 DIAGNOSIS — J449 Chronic obstructive pulmonary disease, unspecified: Secondary | ICD-10-CM | POA: Diagnosis not present

## 2021-01-20 DIAGNOSIS — W19XXXA Unspecified fall, initial encounter: Secondary | ICD-10-CM | POA: Diagnosis not present

## 2021-01-20 DIAGNOSIS — Z96651 Presence of right artificial knee joint: Secondary | ICD-10-CM | POA: Insufficient documentation

## 2021-01-20 DIAGNOSIS — Z7951 Long term (current) use of inhaled steroids: Secondary | ICD-10-CM | POA: Insufficient documentation

## 2021-01-20 DIAGNOSIS — Y92009 Unspecified place in unspecified non-institutional (private) residence as the place of occurrence of the external cause: Secondary | ICD-10-CM

## 2021-01-20 DIAGNOSIS — D631 Anemia in chronic kidney disease: Secondary | ICD-10-CM | POA: Insufficient documentation

## 2021-01-20 DIAGNOSIS — N189 Chronic kidney disease, unspecified: Secondary | ICD-10-CM | POA: Insufficient documentation

## 2021-01-20 DIAGNOSIS — E119 Type 2 diabetes mellitus without complications: Secondary | ICD-10-CM | POA: Diagnosis not present

## 2021-01-20 DIAGNOSIS — S42212A Unspecified displaced fracture of surgical neck of left humerus, initial encounter for closed fracture: Secondary | ICD-10-CM | POA: Insufficient documentation

## 2021-01-20 DIAGNOSIS — S4992XA Unspecified injury of left shoulder and upper arm, initial encounter: Secondary | ICD-10-CM | POA: Diagnosis present

## 2021-01-20 DIAGNOSIS — S42202A Unspecified fracture of upper end of left humerus, initial encounter for closed fracture: Secondary | ICD-10-CM

## 2021-01-20 NOTE — ED Notes (Signed)
Called [facility] for transport back to the [place]

## 2021-01-20 NOTE — ED Triage Notes (Signed)
Pt brought in by [facility] with C/O arm and leg pain, She has a witnessed fall. No LOC, No deformities. She told EMS she is having left arm pain. She is from [facility].

## 2021-01-20 NOTE — Discharge Instructions (Signed)
You have a fracture to the shoulder at the upper arm.  You will be placed in a sling for comfort until you are evaluated by the orthopedic specialist.  He will see in the office next week, and discuss treatment options with you.  You may take your home medications including your daily pain medicine as previously prescribed.  You should use your cane on your right side to assist with walking around.

## 2021-01-20 NOTE — ED Provider Notes (Signed)
[facility] Emergency Department Provider Note ____________________________________________  Time seen: 1800  I have reviewed the triage vital signs and the nursing notes.  HISTORY  Chief Complaint  Fall  HPI [name] [name] is a 55 y.o. female with the below medical history, including stroke, hypertension, and AKI presents to the ED via EMS from her assisted living facility, with complaints of arm and leg pain.  She apparently had a witnessed mechanical fall without any noted head injury or LOC.  She presents with primary complaint of left arm pain. She uses both a cane and a walker to ambulate.  She is on Plavix daily.  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   Acute lower UTI 01/04/2020   Pressure injury of skin 01/03/2020   Dysphagia 01/03/2020   Hypocalcemia 01/03/2020   Anemia in chronic kidney disease (CKD) 01/03/2020   Sepsis (HCC) 01/02/2020   Type 2 diabetes mellitus with hypoglycemia without coma (HCC) 01/02/2020   AKI (acute kidney injury) (HCC) 01/01/2020   Hyperlipidemia    Essential hypertension    Depression with anxiety    Chronic obstructive pulmonary disease (HCC)    Tobacco abuse 10/21/2019   Sensory disturbance 10/21/2019   TIA (transient ischemic attack) 10/20/2019   Altered mental status    Loss of consciousness (HCC) 09/08/2018   Encephalopathy 09/08/2018   S/P TKR (total knee replacement) using cement, 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   Anxiety 04/02/2017   Urinary incontinence 01/25/2016   Fecal incontinence 01/25/2016   Dysthymia 01/25/2016    Past Surgical  History:  Procedure Laterality Date   COLONOSCOPY WITH PROPOFOL N/A 11/27/2018   Procedure: COLONOSCOPY WITH PROPOFOL;  Surgeon: Toledo, Boykin Nearing, MD;  Location: [facility]COPY;  Service: Gastroenterology;  Laterality: N/A;   ESOPHAGOGASTRODUODENOSCOPY (EGD) WITH PROPOFOL N/A 11/27/2018   Procedure: ESOPHAGOGASTRODUODENOSCOPY (EGD) WITH PROPOFOL;  Surgeon: Toledo, Boykin Nearing, MD;  Location: [facility] ENDOSCOPY;  Service: Gastroenterology;  Laterality: N/A;   NO PAST SURGERIES     TOTAL KNEE ARTHROPLASTY Right 10/03/2017   Procedure: TOTAL KNEE ARTHROPLASTY;  Surgeon: Lyndle Herrlich, MD;  Location: [facility] ORS;  Service: Orthopedics;  Laterality: Right;    Prior to Admission medications   Medication Sig Start Date End Date Taking? Authorizing Provider  acetaminophen (TYLENOL) 650 MG CR tablet Take 650 mg by mouth every 8 (eight) hours as needed for pain.    [provider]  albuterol (PROVENTIL HFA;VENTOLIN HFA) 108 (90 Base) MCG/ACT inhaler Inhale 2 puffs into the lungs every 4 (four) hours as needed for wheezing or shortness of breath.    [provider]  amLODipine (NORVASC) 10 MG tablet Take 10 mg by mouth daily. 11/08/20   [provider]  amLODipine (NORVASC) 5 MG tablet Take 1 tablet (5 mg total) by mouth every morning. 01/08/20 02/07/20  Sherryll Burger, Pratik D, DO  aspirin EC 81 MG tablet Take 81 mg by mouth daily. Swallow whole.    [provider]  atorvastatin (LIPITOR) 40 MG tablet Take 40  mg by mouth at bedtime. (2000)    [provider]  busPIRone (BUSPAR) 15 MG tablet Take 1 tablet (15 mg total) by mouth 3 (three) times daily. Patient not taking: No sig reported 02/19/20   Aldean Baker, NP  cholecalciferol (VITAMIN D3) 25 MCG (1000 UT) tablet Take 1,000 Units by mouth every morning.    [provider]  clopidogrel (PLAVIX) 75 MG tablet Take 1 tablet (75 mg total) by mouth daily with breakfast. 10/23/19   Vassie Loll, MD  dexlansoprazole  (DEXILANT) 60 MG capsule Take 1 capsule by mouth daily. 11/08/20   [provider]  Dexlansoprazole 30 MG capsule Take 30 mg by mouth daily. Patient not taking: Reported on 11/18/2020    [provider]  diclofenac sodium (VOLTAREN) 1 % GEL Apply 2 g topically 4 (four) times daily as needed. Patient taking differently: Apply 2 g topically in the morning and at bedtime. 09/13/18   Briant Cedar, MD  docusate sodium (COLACE) 100 MG capsule Take 1 capsule (100 mg total) by mouth 2 (two) times daily. Patient not taking: No sig reported 10/22/19   Vassie Loll, MD  escitalopram (LEXAPRO) 10 MG tablet Take 10 mg by mouth daily.    [provider]  ferrous sulfate 325 (65 FE) MG tablet Take 1 tablet (325 mg total) by mouth 2 (two) times daily with a meal. Patient taking differently: Take 325 mg by mouth daily with breakfast. 09/13/18 11/12/20  Briant Cedar, MD  ferrous sulfate 325 (65 FE) MG tablet Take 1 tablet by mouth daily.    [provider]  Fluticasone-Salmeterol (ADVAIR) 100-50 MCG/DOSE AEPB Inhale 1 puff into the lungs 2 (two) times daily.    [provider]  folic acid (FOLVITE) 1 MG tablet Take 1 mg by mouth daily. Patient not taking: No sig reported    [provider]  furosemide (LASIX) 20 MG tablet Take 1 tablet (20 mg total) by mouth 2 (two) times daily. (0800) Patient taking differently: Take 20 mg by mouth daily. (0800) 10/22/19   Vassie Loll, MD  guaiFENesin (ROBITUSSIN) 100 MG/5ML SOLN Take 5 mLs by mouth 3 (three) times daily. Patient not taking: No sig reported    [provider]  HYDROcodone-acetaminophen (NORCO/VICODIN) 5-325 MG tablet Take 1 tablet by mouth every 4 (four) hours as needed for severe pain. Patient taking differently: Take 1 tablet by mouth 2 (two) times daily. 09/22/20 09/22/21  Shaune Pollack, MD  hydrOXYzine (VISTARIL) 25 MG capsule Take 25 mg by mouth at bedtime.    [provider]   LANTUS SOLOSTAR 100 UNIT/ML Solostar Pen Inject 12 Units into the skin at bedtime. 01/13/20   [provider]  LORazepam (ATIVAN) 1 MG tablet Take 1 mg by mouth 2 (two) times daily as needed for anxiety. 02/09/20   [provider]  megestrol (MEGACE) 40 MG tablet Take 40 mg by mouth daily.    [provider]  metoprolol tartrate (LOPRESSOR) 25 MG tablet Take 25 mg by mouth daily.    [provider]  Multiple Vitamins-Minerals (MULTIVITAMIN WITH MINERALS) tablet Take 1 tablet by mouth daily.    [provider]  nicotine (NICODERM CQ - DOSED IN MG/24 HOURS) 21 mg/24hr patch Place 1 patch (21 mg total) onto the skin daily. Patient not taking: No sig reported 10/23/19   Vassie Loll, MD  pantoprazole (PROTONIX) 40 MG tablet Take 1 tablet (40 mg total) by mouth daily. 09/14/18 10/20/19  Andreas Newport  J, MD  sodium bicarbonate 650 MG tablet Take 650 mg by mouth daily. Patient not taking: Reported on 11/18/2020 02/17/20   [provider]  sodium chloride 1 g tablet Take 1 tablet by mouth in the morning, at noon, and at bedtime. 11/08/20   [provider]  traMADol (ULTRAM) 50 MG tablet Take 50 mg by mouth 4 (four) times daily.  Patient not taking: No sig reported 09/30/19   [provider]  traZODone (DESYREL) 100 MG tablet Take 100 mg by mouth at bedtime. Patient not taking: No sig reported    [provider]  vitamin B-12 (CYANOCOBALAMIN) 1000 MCG tablet Take 1,000 mcg by mouth daily. Patient not taking: No sig reported    [provider]  zolpidem (AMBIEN) 10 MG tablet Take 10 mg by mouth at bedtime as needed. Patient not taking: No sig reported 02/09/20   [provider]    Allergies Latex  Family History  Problem Relation Age of Onset   Heart disease Mother    Heart disease Father     Social History Social History   Tobacco Use   Smoking status: Every Day    Types: Cigarettes    Smokeless tobacco: Never  Vaping Use   Vaping Use: Never used  Substance Use Topics   Alcohol use: No   Drug use: No    Review of Systems  Constitutional: Negative for fever. Eyes: Negative for visual changes. ENT: Negative for sore throat. Cardiovascular: Negative for chest pain. Respiratory: Negative for shortness of breath. Gastrointestinal: Negative for abdominal pain, vomiting and diarrhea. Genitourinary: Negative for dysuria. Musculoskeletal: Negative for back pain. Left upper arm pain and disability Skin: Negative for rash. Neurological: Negative for headaches, focal weakness or numbness. ____________________________________________  PHYSICAL EXAM:  VITAL SIGNS: ED Triage Vitals [01/20/21 1535]  Enc Vitals Group     BP (!) 138/100     Pulse Rate 73     Resp 18     Temp 98.1 F (36.7 C)     Temp Source Oral     SpO2 97 %     Weight 216 lb (98 kg)     Height 5\' 8"  (1.727 m)     Head Circumference      [place] Flow      Pain Score 9     Pain Loc      Pain Edu?      Excl. in GC?     Constitutional: Alert and oriented. Well appearing and in no distress. Head: Normocephalic and atraumatic. Eyes: Conjunctivae are normal. Normal extraocular movements Nose: No congestion/rhinorrhea/epistaxis. Mouth/Throat: Mucous membranes are moist. Neck: Supple. Normal ROM Cardiovascular: Normal rate, regular rhythm. Normal distal pulses. Respiratory: Normal respiratory effort. No wheezes/rales/rhonchi. Gastrointestinal: Soft and nontender. No distention. Musculoskeletal: Normal obvious deformity or dislocation.  Patient with normal composite fist distally.  Decreased active range of motion of left extremity secondary to pain and disability.  Nontender with normal range of motion in all extremities.  Neurologic:  Normal sensation.  Normal speech and language. No gross focal neurologic deficits are appreciated. Skin:  Skin is warm, dry and intact. No rash noted. Psychiatric: Mood  and affect are normal. Patient exhibits appropriate insight and judgment. ____________________________________________    {LABS (pertinent positives/negatives)  ____________________________________________  {EKG  ____________________________________________   RADIOLOGY Official radiology report(s): DG Forearm Left  Result Date: 01/20/2021 CLINICAL DATA:  Fall.  Left arm pain. EXAM: LEFT FOREARM - 2 VIEW COMPARISON:  None. FINDINGS: No fracture  or bone lesion. Elbow and wrist joints are normally aligned. Soft tissues are unremarkable. IMPRESSION: No fracture or dislocation. Electronically Signed   By: Amie Portland M.D.   On: 01/20/2021 16:26   DG Humerus Left  Result Date: 01/20/2021 CLINICAL DATA:  Fall.  Left arm pain. EXAM: LEFT HUMERUS - 2+ VIEW COMPARISON:  None. FINDINGS: Comminuted fracture of the proximal humerus. There is a transverse fracture across the surgical neck with additional comminuted fracture components along the greater tuberosity. No significant fracture displacement or angulation. No other fractures.  No bone lesions. Glenohumeral and elbow joints are normally aligned. IMPRESSION: 1. Comminuted fracture of the proximal left humerus as detailed without significant displacement or angulation. No dislocation. Electronically Signed   By: Amie Portland M.D.   On: 01/20/2021 16:25   ____________________________________________  PROCEDURES  Sling  Procedures ____________________________________________   INITIAL IMPRESSION / ASSESSMENT AND PLAN / ED COURSE  As part of my medical decision making, I reviewed the following data within the electronic MEDICAL RECORD NUMBER Radiograph reviewed as noted and Notes from prior ED visits  Patient ED evaluation of injury sustained following a witnessed mechanical fall, resulting in a closed, nondisplaced humeral head fracture.  No other injuries reported at this time.  She will be placed in appropriate sling and given follow-up  instructions for outpatient management by Ortho.  She will continue with her home meds including the daily pain medicines as prescribed.   ----------------------------------------- 6:15 PM on 01/20/2021 ----------------------------------------- S/w Dr. Rosita Kea: Recommend sling and he will follow the patient in the office next week.  [name] [name] was evaluated in Emergency Department on 01/20/2021 for the symptoms described in the history of present illness. She was evaluated in the context of the global COVID-19 pandemic, which necessitated consideration that the patient might be at risk for infection with the SARS-CoV-2 virus that causes COVID-19. Institutional protocols and algorithms that pertain to the evaluation of patients at risk for COVID-19 are in a state of rapid change based on information released by regulatory bodies including the CDC and federal and state organizations. These policies and algorithms were followed during the patient's care in the ED.  I reviewed the patient's prescription history over the last 12 months in the multi-state controlled substances database(s) that includes [place], [place], [place], [place], [place], [place], [place], [place], [place], [place], [place], [place], [place], and [place].  Results were notable for current RX noted.  ____________________________________________  FINAL CLINICAL IMPRESSION(S) / ED DIAGNOSES  Final diagnoses:  Fall in home, initial encounter  Closed fracture of proximal end of left humerus, unspecified fracture morphology, initial encounter      Lissa Hoard, PA-C 01/20/21 1859    Shaune Pollack, MD 01/20/21 2208

## 2021-01-23 ENCOUNTER — Emergency Department
Admission: EM | Admit: 2021-01-23 | Discharge: 2021-01-23 | Disposition: A | Discharge status: Skilled Nursing Facility | Payer: Medicaid Other | Attending: Emergency Medicine | Admitting: Emergency Medicine

## 2021-01-23 ENCOUNTER — Encounter: Payer: Self-pay | Admitting: Emergency Medicine

## 2021-01-23 ENCOUNTER — Other Ambulatory Visit: Payer: Self-pay

## 2021-01-23 DIAGNOSIS — S4992XD Unspecified injury of left shoulder and upper arm, subsequent encounter: Secondary | ICD-10-CM | POA: Diagnosis present

## 2021-01-23 DIAGNOSIS — X58XXXD Exposure to other specified factors, subsequent encounter: Secondary | ICD-10-CM | POA: Diagnosis not present

## 2021-01-23 DIAGNOSIS — I129 Hypertensive chronic kidney disease with stage 1 through stage 4 chronic kidney disease, or unspecified chronic kidney disease: Secondary | ICD-10-CM | POA: Diagnosis not present

## 2021-01-23 DIAGNOSIS — Z96651 Presence of right artificial knee joint: Secondary | ICD-10-CM | POA: Diagnosis not present

## 2021-01-23 DIAGNOSIS — F17210 Nicotine dependence, cigarettes, uncomplicated: Secondary | ICD-10-CM | POA: Diagnosis not present

## 2021-01-23 DIAGNOSIS — S42215D Unspecified nondisplaced fracture of surgical neck of left humerus, subsequent encounter for fracture with routine healing: Secondary | ICD-10-CM | POA: Insufficient documentation

## 2021-01-23 DIAGNOSIS — Z79899 Other long term (current) drug therapy: Secondary | ICD-10-CM | POA: Insufficient documentation

## 2021-01-23 DIAGNOSIS — E1122 Type 2 diabetes mellitus with diabetic chronic kidney disease: Secondary | ICD-10-CM | POA: Insufficient documentation

## 2021-01-23 DIAGNOSIS — D631 Anemia in chronic kidney disease: Secondary | ICD-10-CM | POA: Diagnosis not present

## 2021-01-23 DIAGNOSIS — N189 Chronic kidney disease, unspecified: Secondary | ICD-10-CM | POA: Diagnosis not present

## 2021-01-23 DIAGNOSIS — Z7982 Long term (current) use of aspirin: Secondary | ICD-10-CM | POA: Diagnosis not present

## 2021-01-23 MED ORDER — OXYCODONE HCL 5 MG PO TABS: 5.0000 mg | ORAL_TABLET | Freq: Three times a day (TID) | ORAL | 0 refills | Status: AC | PRN

## 2021-01-23 MED ORDER — OXYCODONE HCL 5 MG PO TABS
5.0000 mg | ORAL_TABLET | Freq: Three times a day (TID) | ORAL | 0 refills | Status: DC | PRN
Start: 2021-01-23 — End: 2021-01-23

## 2021-01-23 MED ORDER — OXYCODONE HCL 5 MG PO TABS
5.0000 mg | ORAL_TABLET | Freq: Once | ORAL | Status: AC
  Administered 2021-01-23: 08:00:00 5 mg via ORAL
  Filled 2021-01-23: qty 1

## 2021-01-23 NOTE — ED Provider Notes (Signed)
[facility] Emergency Department Provider Note ____________________________________________  Time seen: Approximately 8:09 AM  I have reviewed the triage vital signs and the nursing notes.   HISTORY  Chief Complaint Arm Pain    HPI [name] [name] is a 55 y.o. female who presents to the emergency department for evaluation and treatment of left arm pain.  Diagnosed with humeral fracture on 01/20/2021.  Staff from [facility] sent her back to the emergency department today due to pain not well controlled with Norco.  They also state that she is not wearing a sling or any orthopedic splinting and wonder if this is something that she needs.  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   Acute lower UTI 01/04/2020   Pressure injury of skin 01/03/2020   Dysphagia 01/03/2020   Hypocalcemia 01/03/2020   Anemia in chronic kidney disease (CKD) 01/03/2020   Sepsis (HCC) 01/02/2020   Type 2 diabetes mellitus with hypoglycemia without coma (HCC) 01/02/2020   AKI (acute kidney injury) (HCC) 01/01/2020   Hyperlipidemia    Essential hypertension    Depression with anxiety    Chronic obstructive pulmonary disease (HCC)    Tobacco abuse 10/21/2019   Sensory disturbance 10/21/2019   TIA (transient ischemic attack) 10/20/2019   Altered mental status    Loss of consciousness (HCC) 09/08/2018   Encephalopathy 09/08/2018   S/P TKR (total knee replacement) using cement, 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   Anxiety 04/02/2017   Urinary incontinence 01/25/2016   Fecal incontinence 01/25/2016   Dysthymia 01/25/2016     Past Surgical History:  Procedure Laterality Date   COLONOSCOPY WITH PROPOFOL N/A 11/27/2018   Procedure: COLONOSCOPY WITH PROPOFOL;  Surgeon: Toledo, Boykin Nearing, MD;  Location: [facility];  Service: Gastroenterology;  Laterality: N/A;   ESOPHAGOGASTRODUODENOSCOPY (EGD) WITH PROPOFOL N/A 11/27/2018   Procedure: ESOPHAGOGASTRODUODENOSCOPY (EGD) WITH PROPOFOL;  Surgeon: Toledo, Boykin Nearing, MD;  Location: [facility];  Service: Gastroenterology;  Laterality: N/A;   NO PAST SURGERIES     TOTAL KNEE ARTHROPLASTY Right 10/03/2017   Procedure: TOTAL KNEE ARTHROPLASTY;  Surgeon: Lyndle Herrlich, MD;  Location: [facility] ORS;  Service: Orthopedics;  Laterality: Right;    Prior to Admission medications   Medication Sig Start Date End Date Taking? Authorizing Provider  oxyCODONE (ROXICODONE) 5 MG immediate release tablet Take 1 tablet (5 mg total) by mouth every 8 (eight) hours as needed for up to 4 days for breakthrough pain. 01/23/21 01/27/21 Yes Vennie Salsbury B, FNP  acetaminophen (TYLENOL) 650 MG CR tablet Take 650 mg by mouth every 8 (eight) hours as needed for pain.    [provider]  albuterol (PROVENTIL HFA;VENTOLIN HFA) 108 (90 Base) MCG/ACT inhaler Inhale 2 puffs into the lungs every 4 (four) hours as needed for wheezing or shortness of breath.    [provider]  amLODipine (NORVASC) 10 MG tablet Take 10 mg by mouth daily. 11/08/20   [provider]  amLODipine (NORVASC) 5 MG tablet Take 1 tablet (5 mg  total) by mouth every morning. 01/08/20 02/07/20  Sherryll Burger, Pratik D, DO  aspirin EC 81 MG tablet Take 81 mg by mouth daily. Swallow whole.    [provider]  atorvastatin (LIPITOR) 40 MG tablet Take 40 mg by mouth at bedtime. (2000)    [provider]  busPIRone (BUSPAR) 15 MG tablet Take 1 tablet (15 mg total) by mouth 3 (three) times daily. Patient not taking: No sig reported 02/19/20   Aldean Baker, NP  cholecalciferol (VITAMIN D3) 25 MCG (1000  UT) tablet Take 1,000 Units by mouth every morning.    [provider]  clopidogrel (PLAVIX) 75 MG tablet Take 1 tablet (75 mg total) by mouth daily with breakfast. 10/23/19   Vassie Loll, MD  dexlansoprazole (DEXILANT) 60 MG capsule Take 1 capsule by mouth daily. 11/08/20   [provider]  Dexlansoprazole 30 MG capsule Take 30 mg by mouth daily. Patient not taking: Reported on 11/18/2020    [provider]  diclofenac sodium (VOLTAREN) 1 % GEL Apply 2 g topically 4 (four) times daily as needed. Patient taking differently: Apply 2 g topically in the morning and at bedtime. 09/13/18   Briant Cedar, MD  docusate sodium (COLACE) 100 MG capsule Take 1 capsule (100 mg total) by mouth 2 (two) times daily. Patient not taking: No sig reported 10/22/19   Vassie Loll, MD  escitalopram (LEXAPRO) 10 MG tablet Take 10 mg by mouth daily.    [provider]  ferrous sulfate 325 (65 FE) MG tablet Take 1 tablet (325 mg total) by mouth 2 (two) times daily with a meal. Patient taking differently: Take 325 mg by mouth daily with breakfast. 09/13/18 11/12/20  Briant Cedar, MD  ferrous sulfate 325 (65 FE) MG tablet Take 1 tablet by mouth daily.    [provider]  Fluticasone-Salmeterol (ADVAIR) 100-50 MCG/DOSE AEPB Inhale 1 puff into the lungs 2 (two) times daily.    [provider]  folic acid (FOLVITE) 1 MG tablet Take 1 mg by mouth daily. Patient not taking: No sig reported    [provider]  furosemide (LASIX) 20 MG tablet Take 1 tablet (20 mg total) by mouth 2 (two) times daily. (0800) Patient taking differently: Take 20 mg by mouth daily. (0800) 10/22/19   Vassie Loll, MD  guaiFENesin (ROBITUSSIN) 100 MG/5ML SOLN Take 5 mLs by mouth 3 (three) times daily. Patient not taking: No sig reported    [provider]  HYDROcodone-acetaminophen (NORCO/VICODIN) 5-325 MG tablet Take 1 tablet by mouth every 4 (four) hours as needed for  severe pain. Patient taking differently: Take 1 tablet by mouth 2 (two) times daily. 09/22/20 09/22/21  Shaune Pollack, MD  hydrOXYzine (VISTARIL) 25 MG capsule Take 25 mg by mouth at bedtime.    [provider]  LANTUS SOLOSTAR 100 UNIT/ML Solostar Pen Inject 12 Units into the skin at bedtime. 01/13/20   [provider]  LORazepam (ATIVAN) 1 MG tablet Take 1 mg by mouth 2 (two) times daily as needed for anxiety. 02/09/20   [provider]  megestrol (MEGACE) 40 MG tablet Take 40 mg by mouth daily.    [provider]  metoprolol tartrate (LOPRESSOR) 25 MG tablet Take 25 mg by mouth daily.    [provider]  Multiple Vitamins-Minerals (MULTIVITAMIN WITH MINERALS) tablet Take 1 tablet by mouth daily.    [provider]  nicotine (NICODERM CQ - DOSED IN MG/24 HOURS) 21 mg/24hr patch Place 1  patch (21 mg total) onto the skin daily. Patient not taking: No sig reported 10/23/19   Vassie Loll, MD  pantoprazole (PROTONIX) 40 MG tablet Take 1 tablet (40 mg total) by mouth daily. 09/14/18 10/20/19  Briant Cedar, MD  sodium bicarbonate 650 MG tablet Take 650 mg by mouth daily. Patient not taking: Reported on 11/18/2020 02/17/20   [provider]  sodium chloride 1 g tablet Take 1 tablet by mouth in the morning, at noon, and at bedtime. 11/08/20   [provider]  traMADol (ULTRAM) 50 MG tablet Take 50 mg by mouth 4 (four) times daily.  Patient not taking: No sig reported 09/30/19   [provider]  traZODone (DESYREL) 100 MG tablet Take 100 mg by mouth at bedtime. Patient not taking: No sig reported    [provider]  vitamin B-12 (CYANOCOBALAMIN) 1000 MCG tablet Take 1,000 mcg by mouth daily. Patient not taking: No sig reported    [provider]  zolpidem (AMBIEN) 10 MG tablet Take 10 mg by mouth at bedtime as needed. Patient not taking: No sig reported 02/09/20   [provider]     Allergies Latex  Family History  Problem Relation Age of Onset   Heart disease Mother    Heart disease Father     Social History Social History   Tobacco Use   Smoking status: Every Day    Types: Cigarettes   Smokeless tobacco: Never  Vaping Use   Vaping Use: Never used  Substance Use Topics   Alcohol use: No   Drug use: No    Review of Systems Constitutional: Negative for fever. Cardiovascular: Negative for chest pain. Respiratory: Negative for shortness of breath. Musculoskeletal: Positive for left arm pain. Skin: Negative for open wounds or lesions.  Neurological: Negative for decrease in sensation  ____________________________________________   PHYSICAL EXAM:  VITAL SIGNS: ED Triage Vitals  Enc Vitals Group     BP 01/23/21 0637 (!) 167/87     Pulse Rate 01/23/21 0637 84     Resp 01/23/21 0637 20     Temp 01/23/21 0637 98.4 F (36.9 C)     Temp Source 01/23/21 0637 Oral     SpO2 01/23/21 0637 100 %     Weight 01/23/21 0639 216 lb (98 kg)     Height 01/23/21 0639 5\' 8"  (1.727 m)     Head Circumference --      Peak Flow --      Pain Score 01/23/21 0639 9     Pain Loc --      Pain Edu? --      Excl. in GC? --     Constitutional: Alert and oriented. Well appearing and in no acute distress. Eyes: Conjunctivae are clear without discharge or drainage Head: Atraumatic Neck: Supple Respiratory: No cough. Respirations are even and unlabored. Musculoskeletal: Limited ROM of left upper extremity due to known fracture and pain. Neurologic: Motor and sensory function intact.  Skin: No open wounds associated with fracture of the humerus.  Psychiatric: Affect and behavior are appropriate.  ____________________________________________   LABS (all labs ordered are listed, but only abnormal results are displayed)  Labs Reviewed - No data to display ____________________________________________  RADIOLOGY  Not indicated.   I, 14/04/22, personally  viewed and evaluated these images (plain radiographs) as part of my medical decision making, as well as reviewing the written report by the radiologist.  No results found. ____________________________________________   PROCEDURES  Procedures  ____________________________________________   INITIAL IMPRESSION / ASSESSMENT AND PLAN / ED COURSE  EMMAROSE KLINKE is a 55 y.o. who presents to the emergency department for evaluation of pain and splinting for humeral fracture.  Patient denies additional injury to the arm since her last visit here.  Oxy IR 5 mg given with significant improvement of pain.  Shoulder immobilizer applied.  Prescription for Oxy IR for breakthrough pain every 8 hours sent to patient's pharmacy.  She will be transported back to her skilled nursing facility by EMS.  Patient instructed to follow-up with orthopedics this week and to wear shoulder immobilizer at all times.  She was also instructed to return to the emergency department for symptoms that change or worsen if unable schedule an appointment with orthopedics or primary care.  Facility requested Rx to be sent to different pharmacy. [facility] is closed, but message was left for pharmacy staff not to fill initial prescription.  Medications  oxyCODONE (Oxy IR/ROXICODONE) immediate release tablet 5 mg (5 mg Oral Given 01/23/21 1610)    Pertinent labs & imaging results that were available during my care of the patient were reviewed by me and considered in my medical decision making (see chart for details).   _________________________________________   FINAL CLINICAL IMPRESSION(S) / ED DIAGNOSES  Final diagnoses:  Closed nondisplaced fracture of surgical neck of left humerus with routine healing, unspecified fracture morphology, subsequent encounter    ED Discharge Orders          Ordered    oxyCODONE (ROXICODONE) 5 MG immediate release tablet  Every 8 hours PRN,   Status:  Discontinued         01/23/21 0929    oxyCODONE (ROXICODONE) 5 MG immediate release tablet  Every 8 hours PRN        01/23/21 1123             If controlled substance prescribed during this visit, 12 month history viewed on the [facility] prior to issuing an initial prescription for Schedule II or III opiod.    Chinita Pester, FNP 01/23/21 1125    Minna Antis, MD 01/23/21 1455

## 2021-01-23 NOTE — ED Triage Notes (Addendum)
Pt comes into the ED via [facility] from [facility] c/o left arm pain.  Pt had a fall on Thursday and was evaluated which shows a positive humerus fracture.  Per the facility they thought the patient had a fracture, but the patient presents in no sling, or orthopedic devices including casting or splinting.  Pt in NAD at this time with no obvious deformities.

## 2021-01-23 NOTE — ED Notes (Addendum)
contacted [facility] regarding pt being discharged and transported via  EMS this am (no known ETA but will call when OTW). Explained discharges over the phone and need to contact orthopedic surgeon.

## 2021-01-23 NOTE — ED Notes (Signed)
Pt provided with graham crackers and sprite at bedside.

## 2021-01-23 NOTE — ED Notes (Signed)
[facility] arrived for pickup. Brief replaced on patient and paper pants applied for transport. Belongings given- blue shoes and soiled pants.

## 2021-01-23 NOTE — ED Notes (Signed)
Contacted legal guardian Advice worker listed in chart, the adult manager at [facility] regarding patient being seen and discharged via EMS back to [facility] [place]. Had to leave voicemail.

## 2021-01-23 NOTE — Discharge Instructions (Addendum)
Call Monday to get a follow up appointment with Dr. Rosita Kea.  Keep the sling in place.  Pain medication sent to pharmacy for breakthrough pain if scheduled pain medication is not sufficient.

## 2021-01-23 NOTE — ED Notes (Signed)
After oxycodone, pt cleaned due to urination on bedding/clothing. Removed soiled pants. Bedding replaced. Replaced brief and applied purewick temporarily until EMS pickup.

## 2021-01-23 NOTE — ED Notes (Signed)
Med necessity form filled out for pt to transfer back to [facility] via [facility]

## 2021-01-27 ENCOUNTER — Emergency Department
Admission: EM | Admit: 2021-01-27 | Discharge: 2021-01-27 | Disposition: A | Discharge status: Home / Self Care | Payer: Medicaid Other | Attending: Student in an Organized Health Care Education/Training Program | Admitting: Student in an Organized Health Care Education/Training Program

## 2021-01-27 ENCOUNTER — Encounter: Payer: Self-pay | Admitting: Emergency Medicine

## 2021-01-27 ENCOUNTER — Other Ambulatory Visit: Payer: Self-pay

## 2021-01-27 DIAGNOSIS — N189 Chronic kidney disease, unspecified: Secondary | ICD-10-CM | POA: Insufficient documentation

## 2021-01-27 DIAGNOSIS — Z7902 Long term (current) use of antithrombotics/antiplatelets: Secondary | ICD-10-CM | POA: Insufficient documentation

## 2021-01-27 DIAGNOSIS — E1122 Type 2 diabetes mellitus with diabetic chronic kidney disease: Secondary | ICD-10-CM | POA: Diagnosis not present

## 2021-01-27 DIAGNOSIS — J449 Chronic obstructive pulmonary disease, unspecified: Secondary | ICD-10-CM | POA: Insufficient documentation

## 2021-01-27 DIAGNOSIS — Z91040 Latex allergy status: Secondary | ICD-10-CM | POA: Diagnosis not present

## 2021-01-27 DIAGNOSIS — Z7982 Long term (current) use of aspirin: Secondary | ICD-10-CM | POA: Diagnosis not present

## 2021-01-27 DIAGNOSIS — S42215D Unspecified nondisplaced fracture of surgical neck of left humerus, subsequent encounter for fracture with routine healing: Secondary | ICD-10-CM | POA: Diagnosis not present

## 2021-01-27 DIAGNOSIS — W1839XD Other fall on same level, subsequent encounter: Secondary | ICD-10-CM | POA: Diagnosis not present

## 2021-01-27 DIAGNOSIS — S4992XD Unspecified injury of left shoulder and upper arm, subsequent encounter: Secondary | ICD-10-CM | POA: Diagnosis present

## 2021-01-27 DIAGNOSIS — F17210 Nicotine dependence, cigarettes, uncomplicated: Secondary | ICD-10-CM | POA: Diagnosis not present

## 2021-01-27 DIAGNOSIS — Z79899 Other long term (current) drug therapy: Secondary | ICD-10-CM | POA: Diagnosis not present

## 2021-01-27 DIAGNOSIS — Z7952 Long term (current) use of systemic steroids: Secondary | ICD-10-CM | POA: Insufficient documentation

## 2021-01-27 DIAGNOSIS — I129 Hypertensive chronic kidney disease with stage 1 through stage 4 chronic kidney disease, or unspecified chronic kidney disease: Secondary | ICD-10-CM | POA: Insufficient documentation

## 2021-01-27 MED ORDER — ONDANSETRON 8 MG PO TBDP
8.0000 mg | ORAL_TABLET | Freq: Once | ORAL | Status: AC
Start: 2021-01-27 — End: 2021-01-27
  Administered 2021-01-27: 8 mg via ORAL
  Filled 2021-01-27: qty 1

## 2021-01-27 MED ORDER — FENTANYL CITRATE (PF) 250 MCG/5ML IJ SOLN
100.0000 ug | Freq: Once | INTRAMUSCULAR | Status: AC
Start: 2021-01-27 — End: 2021-01-27
  Administered 2021-01-27: 100 ug via INTRAMUSCULAR
  Filled 2021-01-27: qty 5

## 2021-01-27 NOTE — ED Notes (Signed)
Called [facility] for transport to [facility] [address]

## 2021-01-27 NOTE — ED Provider Notes (Signed)
[facility] Emergency Department Provider Note  ____________________________________________  Time seen: Approximately 5:49 PM  I have reviewed the triage vital signs and the nursing notes.   HISTORY  Chief Complaint Arm Pain    HPI [name] [name] is a 55 y.o. female who presents to the emergency department EMS for ongoing left arm pain.  Patient sustained a fall with a known fracture of the left humeral neck.  Patient has been seen twice subsequently for ongoing pain.  She was prescribed Percocet as her last visit 2 days ago, but the facility had not filled or administered the prescription.  Patient presents with worsening pain.  No new trauma.  Patient's arm is in a sling.         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   Acute lower UTI 01/04/2020   Pressure injury of skin 01/03/2020   Dysphagia 01/03/2020   Hypocalcemia 01/03/2020   Anemia in chronic kidney disease (CKD) 01/03/2020   Sepsis (HCC) 01/02/2020   Type 2 diabetes mellitus with hypoglycemia without coma (HCC) 01/02/2020   AKI (acute kidney injury) (HCC) 01/01/2020   Hyperlipidemia    Essential hypertension    Depression with anxiety    Chronic obstructive pulmonary disease (HCC)    Tobacco abuse 10/21/2019   Sensory disturbance 10/21/2019   TIA (transient ischemic attack) 10/20/2019   Altered mental status    Loss of consciousness (HCC) 09/08/2018   Encephalopathy 09/08/2018   S/P TKR (total knee replacement) using cement, 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   Anxiety 04/02/2017   Urinary incontinence 01/25/2016    Fecal incontinence 01/25/2016   Dysthymia 01/25/2016    Past Surgical History:  Procedure Laterality Date   COLONOSCOPY WITH PROPOFOL N/A 11/27/2018   Procedure: COLONOSCOPY WITH PROPOFOL;  Surgeon: Toledo, Boykin Nearing, MD;  Location: [facility] ENDOSCOPY;  Service: Gastroenterology;  Laterality: N/A;   ESOPHAGOGASTRODUODENOSCOPY (EGD) WITH PROPOFOL N/A 11/27/2018   Procedure: ESOPHAGOGASTRODUODENOSCOPY (EGD) WITH PROPOFOL;  Surgeon: Toledo, Boykin Nearing, MD;  Location: [facility] ENDOSCOPY;  Service: Gastroenterology;  Laterality: N/A;   NO PAST SURGERIES     TOTAL KNEE ARTHROPLASTY Right 10/03/2017   Procedure: TOTAL KNEE ARTHROPLASTY;  Surgeon: Lyndle Herrlich, MD;  Location: [facility] ORS;  Service: Orthopedics;  Laterality: Right;    Prior to Admission medications   Medication Sig Start Date End Date Taking? Authorizing Provider  acetaminophen (TYLENOL) 650 MG CR tablet Take 650 mg by mouth every 8 (eight) hours as needed for pain.    [provider]  albuterol (PROVENTIL HFA;VENTOLIN HFA) 108 (90 Base) MCG/ACT inhaler Inhale 2 puffs into the lungs every 4 (four) hours as needed for wheezing or shortness of breath.    [provider]  amLODipine (NORVASC) 10 MG tablet Take 10 mg by mouth daily. 11/08/20   [provider]  amLODipine (NORVASC) 5 MG tablet Take 1 tablet (5 mg total) by mouth every morning. 01/08/20 02/07/20  Sherryll Burger, Pratik D, DO  aspirin EC 81 MG tablet Take 81 mg  by mouth daily. Swallow whole.    [provider]  atorvastatin (LIPITOR) 40 MG tablet Take 40 mg by mouth at bedtime. (2000)    [provider]  busPIRone (BUSPAR) 15 MG tablet Take 1 tablet (15 mg total) by mouth 3 (three) times daily. Patient not taking: No sig reported 02/19/20   Aldean Baker, NP  cholecalciferol (VITAMIN D3) 25 MCG (1000 UT) tablet Take 1,000 Units by mouth every morning.    [provider]  clopidogrel (PLAVIX) 75 MG tablet Take 1 tablet (75 mg total) by mouth  daily with breakfast. 10/23/19   Vassie Loll, MD  dexlansoprazole (DEXILANT) 60 MG capsule Take 1 capsule by mouth daily. 11/08/20   [provider]  Dexlansoprazole 30 MG capsule Take 30 mg by mouth daily. Patient not taking: Reported on 11/18/2020    [provider]  diclofenac sodium (VOLTAREN) 1 % GEL Apply 2 g topically 4 (four) times daily as needed. Patient taking differently: Apply 2 g topically in the morning and at bedtime. 09/13/18   Briant Cedar, MD  docusate sodium (COLACE) 100 MG capsule Take 1 capsule (100 mg total) by mouth 2 (two) times daily. Patient not taking: No sig reported 10/22/19   Vassie Loll, MD  escitalopram (LEXAPRO) 10 MG tablet Take 10 mg by mouth daily.    [provider]  ferrous sulfate 325 (65 FE) MG tablet Take 1 tablet (325 mg total) by mouth 2 (two) times daily with a meal. Patient taking differently: Take 325 mg by mouth daily with breakfast. 09/13/18 11/12/20  Briant Cedar, MD  ferrous sulfate 325 (65 FE) MG tablet Take 1 tablet by mouth daily.    [provider]  Fluticasone-Salmeterol (ADVAIR) 100-50 MCG/DOSE AEPB Inhale 1 puff into the lungs 2 (two) times daily.    [provider]  folic acid (FOLVITE) 1 MG tablet Take 1 mg by mouth daily. Patient not taking: No sig reported    [provider]  furosemide (LASIX) 20 MG tablet Take 1 tablet (20 mg total) by mouth 2 (two) times daily. (0800) Patient taking differently: Take 20 mg by mouth daily. (0800) 10/22/19   Vassie Loll, MD  guaiFENesin (ROBITUSSIN) 100 MG/5ML SOLN Take 5 mLs by mouth 3 (three) times daily. Patient not taking: No sig reported    [provider]  HYDROcodone-acetaminophen (NORCO/VICODIN) 5-325 MG tablet Take 1 tablet by mouth every 4 (four) hours as needed for severe pain. Patient taking differently: Take 1 tablet by mouth 2 (two) times daily. 09/22/20 09/22/21  Shaune Pollack, MD  hydrOXYzine (VISTARIL) 25 MG  capsule Take 25 mg by mouth at bedtime.    [provider]  LANTUS SOLOSTAR 100 UNIT/ML Solostar Pen Inject 12 Units into the skin at bedtime. 01/13/20   [provider]  LORazepam (ATIVAN) 1 MG tablet Take 1 mg by mouth 2 (two) times daily as needed for anxiety. 02/09/20   [provider]  megestrol (MEGACE) 40 MG tablet Take 40 mg by mouth daily.    [provider]  metoprolol tartrate (LOPRESSOR) 25 MG tablet Take 25 mg by mouth daily.    [provider]  Multiple Vitamins-Minerals (MULTIVITAMIN WITH MINERALS) tablet Take 1 tablet by mouth daily.    [provider]  nicotine (NICODERM CQ - DOSED IN MG/24 HOURS) 21 mg/24hr patch Place 1 patch (21 mg total) onto the skin daily. Patient not taking: No sig reported 10/23/19   Vassie Loll, MD  oxyCODONE (ROXICODONE) 5 MG immediate release tablet Take 1 tablet (5 mg total) by mouth every 8 (eight) hours as needed for up to 4 days for breakthrough pain. 01/23/21 01/27/21  Triplett, Rulon Eisenmenger B, FNP  pantoprazole (PROTONIX) 40 MG tablet Take 1 tablet (40 mg total) by mouth daily. 09/14/18 10/20/19  Briant Cedar, MD  sodium bicarbonate 650 MG tablet Take 650 mg by mouth daily. Patient not taking: Reported on 11/18/2020 02/17/20   [provider]  sodium chloride 1 g tablet Take 1 tablet by mouth in the morning, at noon, and at bedtime. 11/08/20   [provider]  traMADol (ULTRAM) 50 MG tablet Take 50 mg by mouth 4 (four) times daily.  Patient not taking: No sig reported 09/30/19   [provider]  traZODone (DESYREL) 100 MG tablet Take 100 mg by mouth at bedtime. Patient not taking: No sig reported    [provider]  vitamin B-12 (CYANOCOBALAMIN) 1000 MCG tablet Take 1,000 mcg by mouth daily. Patient not taking: No sig reported    [provider]  zolpidem (AMBIEN) 10 MG tablet Take 10 mg by mouth at bedtime as needed. Patient not taking: No sig reported  02/09/20   [provider]    Allergies Latex  Family History  Problem Relation Age of Onset   Heart disease Mother    Heart disease Father     Social History Social History   Tobacco Use   Smoking status: Every Day    Types: Cigarettes   Smokeless tobacco: Never  Vaping Use   Vaping Use: Never used  Substance Use Topics   Alcohol use: No   Drug use: No     Review of Systems  Constitutional: No fever/chills Eyes: No visual changes. No discharge ENT: No upper respiratory complaints. Cardiovascular: no chest pain. Respiratory: no cough. No SOB. Gastrointestinal: No abdominal pain.  No nausea, no vomiting.  Musculoskeletal: Left shoulder pain, known fracture Skin: Negative for rash, abrasions, lacerations, ecchymosis. Neurological: Negative for headaches, focal weakness or numbness.  10 System ROS otherwise negative.  ____________________________________________   PHYSICAL EXAM:  VITAL SIGNS: ED Triage Vitals  Enc Vitals Group     BP 01/27/21 1558 (!) 155/85     Pulse Rate 01/27/21 1558 79     Resp 01/27/21 1558 19     Temp 01/27/21 1558 99.2 F (37.3 C)     Temp Source 01/27/21 1558 Oral     SpO2 01/27/21 1558 98 %     Weight 01/27/21 1557 215 lb 15.8 oz (98 kg)     Height 01/27/21 1557  (1.727 m)     Head Circumference --      Peak Flow --      Pain Score 01/27/21 1557 10     Pain Loc --      Pain Edu? --      Excl. in GC? --      Constitutional: Alert and oriented. Well appearing and in no acute distress. Eyes: Conjunctivae are normal. PERRL. EOMI. Head: Atraumatic. ENT:      Ears:       Nose: No congestion/rhinnorhea.      Mouth/Throat: Mucous membranes are moist.  Neck: No stridor.    Cardiovascular: Normal rate, regular rhythm. Normal S1 and S2.  Good peripheral circulation. Respiratory: Normal respiratory effort without tachypnea or retractions. Lungs CTAB. Good air entry to the bases with no decreased or absent breath  sounds. Musculoskeletal: Full range of motion  to all extremities. No gross deformities appreciated.  Visualization of the left arm reveals slight edema which would be expected with underlying fracture.  Compartments are soft.  No range of motion was performed as patient does have a known fracture.  No open wounds.  Radial pulse and sensation intact distally. Neurologic:  Normal speech and language. No gross focal neurologic deficits are appreciated.  Skin:  Skin is warm, dry and intact. No rash noted. Psychiatric: Mood and affect are normal. Speech and behavior are normal. Patient exhibits appropriate insight and judgement.   ____________________________________________   LABS (all labs ordered are listed, but only abnormal results are displayed)  Labs Reviewed - No data to display ____________________________________________  EKG   ____________________________________________  RADIOLOGY  Imaging from 01/20/2021 reviewed.  Left humeral neck fracture   No results found.  ____________________________________________    PROCEDURES  Procedure(s) performed:    Procedures    Medications  fentaNYL citrate (PF) (SUBLIMAZE) injection 100 mcg (100 mcg Intramuscular Given 01/27/21 1753)  ondansetron (ZOFRAN-ODT) disintegrating tablet 8 mg (8 mg Oral Given 01/27/21 1756)     ____________________________________________   INITIAL IMPRESSION / ASSESSMENT AND PLAN / ED COURSE  Pertinent labs & imaging results that were available during my care of the patient were reviewed by me and considered in my medical decision making (see chart for details).  Review of the [place] CSRS was performed in accordance of the [facility] prior to dispensing any controlled drugs.           Patient's diagnosis is consistent with humeral neck fracture.  Patient presents for pain relief.  Patient was seen after her initial injury, then subsequently for uncontrolled pain.  Patient had been placed on Percocet  but the facility had not picked up nor started her on Percocet.  She has not been receiving any narcotics since her last visit.  No new injury.  Patient was given fentanyl with good success of relief of pain..  Facility reportedly has picked up her prescription at this time and may continue to use this prescription for pain relief.  Follow-up with orthopedics as needed.. Patient is given ED precautions to return to the ED for any worsening or new symptoms.     ____________________________________________  FINAL CLINICAL IMPRESSION(S) / ED DIAGNOSES  Final diagnoses:  Closed nondisplaced fracture of surgical neck of left humerus with routine healing, unspecified fracture morphology, subsequent encounter      NEW MEDICATIONS STARTED DURING THIS VISIT:  ED Discharge Orders     None           This chart was dictated using voice recognition software/Dragon. Despite best efforts to proofread, errors can occur which can change the meaning. Any change was purely unintentional.    Racheal Patches, PA-C 01/27/21 1812    Willy Eddy, MD 01/27/21 2200

## 2021-01-27 NOTE — ED Notes (Addendum)
Resting in bed. 8/10 pain in L arm. Awake and alert. L arm in sling. CMS intact. Cap refill < 3 sec.

## 2021-01-27 NOTE — ED Triage Notes (Signed)
Pt comes into the ED via [facility] from [facility] [place] c/o left arm pain.  Pt has known fracture, was seen here the other day for a second fall since the fracture was diagnosed.  Patient was sent home with pain medication, but it just got filled and the patient has received any yet at this time.  Pt now c/o uncontrolled pain and increased swelling in the arm.  Pt presents in sling of the left arm.    80 HR 98% RA 159/85 282 CBG

## 2021-02-22 ENCOUNTER — Emergency Department: Payer: Medicaid Other

## 2021-02-22 ENCOUNTER — Other Ambulatory Visit: Payer: Self-pay

## 2021-02-22 ENCOUNTER — Inpatient Hospital Stay
Admission: EM | Admit: 2021-02-22 | Discharge: 2021-03-03 | DRG: 871 | Disposition: A | Discharge status: Skilled Nursing Facility | Payer: Medicaid Other | Attending: Internal Medicine | Admitting: Internal Medicine

## 2021-02-22 DIAGNOSIS — I130 Hypertensive heart and chronic kidney disease with heart failure and stage 1 through stage 4 chronic kidney disease, or unspecified chronic kidney disease: Secondary | ICD-10-CM | POA: Diagnosis present

## 2021-02-22 DIAGNOSIS — E872 Acidosis, unspecified: Secondary | ICD-10-CM | POA: Diagnosis present

## 2021-02-22 DIAGNOSIS — F418 Other specified anxiety disorders: Secondary | ICD-10-CM | POA: Diagnosis present

## 2021-02-22 DIAGNOSIS — F32A Depression, unspecified: Secondary | ICD-10-CM | POA: Diagnosis present

## 2021-02-22 DIAGNOSIS — Z7951 Long term (current) use of inhaled steroids: Secondary | ICD-10-CM

## 2021-02-22 DIAGNOSIS — Z8673 Personal history of transient ischemic attack (TIA), and cerebral infarction without residual deficits: Secondary | ICD-10-CM

## 2021-02-22 DIAGNOSIS — Z7902 Long term (current) use of antithrombotics/antiplatelets: Secondary | ICD-10-CM

## 2021-02-22 DIAGNOSIS — N1832 Chronic kidney disease, stage 3b: Secondary | ICD-10-CM | POA: Diagnosis present

## 2021-02-22 DIAGNOSIS — Z6833 Body mass index (BMI) 33.0-33.9, adult: Secondary | ICD-10-CM | POA: Diagnosis not present

## 2021-02-22 DIAGNOSIS — M1990 Unspecified osteoarthritis, unspecified site: Secondary | ICD-10-CM | POA: Diagnosis present

## 2021-02-22 DIAGNOSIS — Z8249 Family history of ischemic heart disease and other diseases of the circulatory system: Secondary | ICD-10-CM

## 2021-02-22 DIAGNOSIS — I5033 Acute on chronic diastolic (congestive) heart failure: Secondary | ICD-10-CM | POA: Diagnosis not present

## 2021-02-22 DIAGNOSIS — Z20822 Contact with and (suspected) exposure to covid-19: Secondary | ICD-10-CM | POA: Diagnosis present

## 2021-02-22 DIAGNOSIS — N183 Chronic kidney disease, stage 3 unspecified: Secondary | ICD-10-CM

## 2021-02-22 DIAGNOSIS — D75839 Thrombocytosis, unspecified: Secondary | ICD-10-CM | POA: Diagnosis present

## 2021-02-22 DIAGNOSIS — D509 Iron deficiency anemia, unspecified: Secondary | ICD-10-CM | POA: Diagnosis present

## 2021-02-22 DIAGNOSIS — L97929 Non-pressure chronic ulcer of unspecified part of left lower leg with unspecified severity: Secondary | ICD-10-CM | POA: Diagnosis present

## 2021-02-22 DIAGNOSIS — Z79899 Other long term (current) drug therapy: Secondary | ICD-10-CM

## 2021-02-22 DIAGNOSIS — N179 Acute kidney failure, unspecified: Secondary | ICD-10-CM | POA: Diagnosis present

## 2021-02-22 DIAGNOSIS — E1122 Type 2 diabetes mellitus with diabetic chronic kidney disease: Secondary | ICD-10-CM | POA: Diagnosis present

## 2021-02-22 DIAGNOSIS — L03116 Cellulitis of left lower limb: Secondary | ICD-10-CM | POA: Diagnosis present

## 2021-02-22 DIAGNOSIS — K219 Gastro-esophageal reflux disease without esophagitis: Secondary | ICD-10-CM | POA: Diagnosis present

## 2021-02-22 DIAGNOSIS — A419 Sepsis, unspecified organism: Secondary | ICD-10-CM | POA: Diagnosis present

## 2021-02-22 DIAGNOSIS — E785 Hyperlipidemia, unspecified: Secondary | ICD-10-CM | POA: Diagnosis present

## 2021-02-22 DIAGNOSIS — Z91040 Latex allergy status: Secondary | ICD-10-CM

## 2021-02-22 DIAGNOSIS — F419 Anxiety disorder, unspecified: Secondary | ICD-10-CM | POA: Diagnosis present

## 2021-02-22 DIAGNOSIS — I10 Essential (primary) hypertension: Secondary | ICD-10-CM | POA: Diagnosis not present

## 2021-02-22 DIAGNOSIS — L97529 Non-pressure chronic ulcer of other part of left foot with unspecified severity: Secondary | ICD-10-CM | POA: Diagnosis present

## 2021-02-22 DIAGNOSIS — D631 Anemia in chronic kidney disease: Secondary | ICD-10-CM | POA: Diagnosis present

## 2021-02-22 DIAGNOSIS — E871 Hypo-osmolality and hyponatremia: Secondary | ICD-10-CM | POA: Diagnosis present

## 2021-02-22 DIAGNOSIS — J449 Chronic obstructive pulmonary disease, unspecified: Secondary | ICD-10-CM | POA: Diagnosis present

## 2021-02-22 DIAGNOSIS — Z794 Long term (current) use of insulin: Secondary | ICD-10-CM

## 2021-02-22 DIAGNOSIS — R52 Pain, unspecified: Secondary | ICD-10-CM

## 2021-02-22 DIAGNOSIS — Z7982 Long term (current) use of aspirin: Secondary | ICD-10-CM

## 2021-02-22 DIAGNOSIS — N1831 Chronic kidney disease, stage 3a: Secondary | ICD-10-CM | POA: Diagnosis not present

## 2021-02-22 DIAGNOSIS — E669 Obesity, unspecified: Secondary | ICD-10-CM | POA: Diagnosis present

## 2021-02-22 DIAGNOSIS — Z96651 Presence of right artificial knee joint: Secondary | ICD-10-CM

## 2021-02-22 DIAGNOSIS — L0390 Cellulitis, unspecified: Secondary | ICD-10-CM

## 2021-02-22 DIAGNOSIS — F17210 Nicotine dependence, cigarettes, uncomplicated: Secondary | ICD-10-CM | POA: Diagnosis present

## 2021-02-22 LAB — TYPE AND SCREEN
ABO/RH(D): A POS
Antibody Screen: NEGATIVE

## 2021-02-22 LAB — LACTIC ACID, PLASMA
Lactic Acid, Venous: 0.8 mmol/L (ref 0.5–1.9)
Lactic Acid, Venous: 0.7 mmol/L (ref 0.5–1.9)
Lactic Acid, Venous: 1 mmol/L (ref 0.5–1.9)

## 2021-02-22 LAB — CBC WITH DIFFERENTIAL/PLATELET
Abs Immature Granulocytes: 0.11 10*3/uL — ABNORMAL HIGH (ref 0.00–0.07)
Basophils Absolute: 0 10*3/uL (ref 0.0–0.1)
Basophils Relative: 0 %
Eosinophils Absolute: 0.6 10*3/uL — ABNORMAL HIGH (ref 0.0–0.5)
Eosinophils Relative: 4 %
HCT: 22.4 % — ABNORMAL LOW (ref 36.0–46.0)
Hemoglobin: 7.2 g/dL — ABNORMAL LOW (ref 12.0–15.0)
Immature Granulocytes: 1 %
Lymphocytes Relative: 10 %
Lymphs Abs: 1.5 10*3/uL (ref 0.7–4.0)
MCH: 30.5 pg (ref 26.0–34.0)
MCHC: 32.1 g/dL (ref 30.0–36.0)
MCV: 94.9 fL (ref 80.0–100.0)
Monocytes Absolute: 0.8 10*3/uL (ref 0.1–1.0)
Monocytes Relative: 5 %
Neutro Abs: 12.2 10*3/uL — ABNORMAL HIGH (ref 1.7–7.7)
Neutrophils Relative %: 80 %
Platelets: 305 10*3/uL (ref 150–400)
RBC: 2.36 MIL/uL — ABNORMAL LOW (ref 3.87–5.11)
RDW: 13.9 % (ref 11.5–15.5)
WBC: 15.2 10*3/uL — ABNORMAL HIGH (ref 4.0–10.5)
nRBC: 0 % (ref 0.0–0.2)

## 2021-02-22 LAB — COMPREHENSIVE METABOLIC PANEL
ALT: 44 U/L (ref 0–44)
AST: 58 U/L — ABNORMAL HIGH (ref 15–41)
Albumin: 2.2 g/dL — ABNORMAL LOW (ref 3.5–5.0)
Alkaline Phosphatase: 82 U/L (ref 38–126)
Anion gap: 7 (ref 5–15)
BUN: 37 mg/dL — ABNORMAL HIGH (ref 6–20)
CO2: 19 mmol/L — ABNORMAL LOW (ref 22–32)
Calcium: 7.8 mg/dL — ABNORMAL LOW (ref 8.9–10.3)
Chloride: 111 mmol/L (ref 98–111)
Creatinine, Ser: 1.67 mg/dL — ABNORMAL HIGH (ref 0.44–1.00)
GFR, Estimated: 36 mL/min — ABNORMAL LOW (ref >60)
Glucose, Bld: 133 mg/dL — ABNORMAL HIGH (ref 70–99)
Potassium: 3.8 mmol/L (ref 3.5–5.1)
Sodium: 137 mmol/L (ref 135–145)
Total Bilirubin: 0.6 mg/dL (ref 0.3–1.2)
Total Protein: 6.7 g/dL (ref 6.5–8.1)

## 2021-02-22 LAB — CBC
HCT: 25.4 % — ABNORMAL LOW (ref 36.0–46.0)
Hemoglobin: 8.3 g/dL — ABNORMAL LOW (ref 12.0–15.0)
MCH: 30.4 pg (ref 26.0–34.0)
MCHC: 32.7 g/dL (ref 30.0–36.0)
MCV: 93 fL (ref 80.0–100.0)
Platelets: 357 10*3/uL (ref 150–400)
RBC: 2.73 MIL/uL — ABNORMAL LOW (ref 3.87–5.11)
RDW: 13.8 % (ref 11.5–15.5)
WBC: 19.9 10*3/uL — ABNORMAL HIGH (ref 4.0–10.5)
nRBC: 0 % (ref 0.0–0.2)

## 2021-02-22 LAB — FOLATE: Folate: 9.7 ng/mL (ref >5.9)

## 2021-02-22 LAB — SEDIMENTATION RATE: Sed Rate: 127 mm/hr — ABNORMAL HIGH (ref 0–30)

## 2021-02-22 LAB — HEMOGLOBIN A1C
Hgb A1c MFr Bld: 6.3 % — ABNORMAL HIGH (ref 4.8–5.6)
Mean Plasma Glucose: 134.11 mg/dL

## 2021-02-22 LAB — TROPONIN I (HIGH SENSITIVITY)
Troponin I (High Sensitivity): 7 ng/L (ref <18)
Troponin I (High Sensitivity): 6 ng/L (ref <18)

## 2021-02-22 LAB — VITAMIN B12: Vitamin B-12: 557 pg/mL (ref 180–914)

## 2021-02-22 LAB — IRON AND TIBC
Iron: 12 ug/dL — ABNORMAL LOW (ref 28–170)
Saturation Ratios: 7 % — ABNORMAL LOW (ref 10.4–31.8)
TIBC: 183 ug/dL — ABNORMAL LOW (ref 250–450)
UIBC: 171 ug/dL

## 2021-02-22 LAB — PREALBUMIN: Prealbumin: 10.3 mg/dL — ABNORMAL LOW (ref 18–38)

## 2021-02-22 LAB — HEMOGLOBIN AND HEMATOCRIT, BLOOD
HCT: 24.6 % — ABNORMAL LOW (ref 36.0–46.0)
Hemoglobin: 7.8 g/dL — ABNORMAL LOW (ref 12.0–15.0)

## 2021-02-22 LAB — C-REACTIVE PROTEIN: CRP: 26.4 mg/dL — ABNORMAL HIGH (ref <1.0)

## 2021-02-22 LAB — CBG MONITORING, ED: Glucose-Capillary: 135 mg/dL — ABNORMAL HIGH (ref 70–99)

## 2021-02-22 LAB — CREATININE, SERUM
Creatinine, Ser: 1.56 mg/dL — ABNORMAL HIGH (ref 0.44–1.00)
GFR, Estimated: 39 mL/min — ABNORMAL LOW (ref >60)

## 2021-02-22 LAB — RESP PANEL BY RT-PCR (FLU A&B, COVID) ARPGX2
Influenza A by PCR: NEGATIVE
Influenza B by PCR: NEGATIVE
SARS Coronavirus 2 by RT PCR: NEGATIVE

## 2021-02-22 LAB — VITAMIN D 25 HYDROXY (VIT D DEFICIENCY, FRACTURES): Vit D, 25-Hydroxy: 35.23 ng/mL (ref 30–100)

## 2021-02-22 LAB — GLUCOSE, CAPILLARY
Glucose-Capillary: 124 mg/dL — ABNORMAL HIGH (ref 70–99)
Glucose-Capillary: 144 mg/dL — ABNORMAL HIGH (ref 70–99)

## 2021-02-22 LAB — BRAIN NATRIURETIC PEPTIDE: B Natriuretic Peptide: 286.5 pg/mL — ABNORMAL HIGH (ref 0.0–100.0)

## 2021-02-22 LAB — HIV ANTIBODY (ROUTINE TESTING W REFLEX): HIV Screen 4th Generation wRfx: NONREACTIVE

## 2021-02-22 MED ORDER — ONDANSETRON HCL 4 MG PO TABS: 4.0000 mg | ORAL_TABLET | Freq: Four times a day (QID) | ORAL | Status: DC | PRN

## 2021-02-22 MED ORDER — CLOPIDOGREL BISULFATE 75 MG PO TABS
75.0000 mg | ORAL_TABLET | Freq: Every day | ORAL | Status: DC
  Administered 2021-02-23 – 2021-03-03 (×9): 75 mg via ORAL
  Filled 2021-02-22 (×9): qty 1

## 2021-02-22 MED ORDER — ESCITALOPRAM OXALATE 10 MG PO TABS
10.0000 mg | ORAL_TABLET | Freq: Every day | ORAL | Status: DC
  Administered 2021-02-22 – 2021-03-03 (×10): 10 mg via ORAL
  Filled 2021-02-22 (×10): qty 1

## 2021-02-22 MED ORDER — ADULT MULTIVITAMIN W/MINERALS CH
1.0000 | ORAL_TABLET | Freq: Every day | ORAL | Status: DC
  Administered 2021-02-22 – 2021-03-03 (×9): 1 via ORAL
  Filled 2021-02-22 (×9): qty 1

## 2021-02-22 MED ORDER — ACETAMINOPHEN 325 MG PO TABS
650.0000 mg | ORAL_TABLET | Freq: Four times a day (QID) | ORAL | Status: DC | PRN
  Administered 2021-02-23 – 2021-03-03 (×11): 650 mg via ORAL
  Filled 2021-02-22 (×5): qty 2

## 2021-02-22 MED ORDER — PANTOPRAZOLE SODIUM 40 MG PO TBEC
40.0000 mg | DELAYED_RELEASE_TABLET | Freq: Every day | ORAL | Status: DC
  Administered 2021-02-22 – 2021-03-03 (×10): 40 mg via ORAL
  Filled 2021-02-22 (×11): qty 1

## 2021-02-22 MED ORDER — SODIUM CHLORIDE 0.9 % IV SOLN
2.0000 g | Freq: Once | INTRAVENOUS | Status: AC
  Administered 2021-02-22: 2 g via INTRAVENOUS
  Filled 2021-02-22: qty 20

## 2021-02-22 MED ORDER — ONDANSETRON HCL 4 MG/2ML IJ SOLN: 4.0000 mg | Freq: Four times a day (QID) | INTRAMUSCULAR | Status: DC | PRN

## 2021-02-22 MED ORDER — FLUTICASONE FUROATE-VILANTEROL 100-25 MCG/ACT IN AEPB
1.0000 | INHALATION_SPRAY | Freq: Every day | RESPIRATORY_TRACT | Status: DC
  Administered 2021-02-23 – 2021-03-03 (×8): 1 via RESPIRATORY_TRACT
  Filled 2021-02-22: qty 28

## 2021-02-22 MED ORDER — METOPROLOL TARTRATE 25 MG PO TABS
25.0000 mg | ORAL_TABLET | Freq: Every day | ORAL | Status: DC
  Administered 2021-02-22 – 2021-03-03 (×10): 25 mg via ORAL
  Filled 2021-02-22 (×10): qty 1

## 2021-02-22 MED ORDER — ACETAMINOPHEN 325 MG PO TABS
650.0000 mg | ORAL_TABLET | Freq: Three times a day (TID) | ORAL | Status: DC | PRN
  Administered 2021-02-23 – 2021-02-27 (×3): 650 mg via ORAL
  Filled 2021-02-22 (×9): qty 2

## 2021-02-22 MED ORDER — SODIUM CHLORIDE 0.9 % IV SOLN: 250.0000 mL | INTRAVENOUS | Status: DC | PRN

## 2021-02-22 MED ORDER — VANCOMYCIN HCL 2000 MG/400ML IV SOLN
2000.0000 mg | INTRAVENOUS | Status: AC
  Administered 2021-02-22: 2000 mg via INTRAVENOUS
  Filled 2021-02-22: qty 400

## 2021-02-22 MED ORDER — FERROUS SULFATE 325 (65 FE) MG PO TABS: 325.0000 mg | ORAL_TABLET | Freq: Every day | ORAL | Status: DC

## 2021-02-22 MED ORDER — ENOXAPARIN SODIUM 40 MG/0.4ML IJ SOSY: 40.0000 mg | PREFILLED_SYRINGE | INTRAMUSCULAR | Status: DC

## 2021-02-22 MED ORDER — ENOXAPARIN SODIUM 60 MG/0.6ML IJ SOSY
0.5000 mg/kg | PREFILLED_SYRINGE | INTRAMUSCULAR | Status: DC
  Administered 2021-02-22 – 2021-03-03 (×10): 50 mg via SUBCUTANEOUS
  Filled 2021-02-22 (×10): qty 0.6

## 2021-02-22 MED ORDER — POLYETHYLENE GLYCOL 3350 17 G PO PACK: 17.0000 g | PACK | Freq: Every day | ORAL | Status: DC | PRN

## 2021-02-22 MED ORDER — ASCORBIC ACID 500 MG PO TABS
500.0000 mg | ORAL_TABLET | Freq: Every day | ORAL | Status: DC
  Administered 2021-02-22 – 2021-03-03 (×10): 500 mg via ORAL
  Filled 2021-02-22 (×10): qty 1

## 2021-02-22 MED ORDER — INSULIN GLARGINE-YFGN 100 UNIT/ML ~~LOC~~ SOLN
12.0000 [IU] | Freq: Every day | SUBCUTANEOUS | Status: DC
  Administered 2021-02-22 – 2021-03-03 (×10): 12 [IU] via SUBCUTANEOUS
  Filled 2021-02-22 (×11): qty 0.12

## 2021-02-22 MED ORDER — SODIUM CHLORIDE 0.9% FLUSH
3.0000 mL | Freq: Two times a day (BID) | INTRAVENOUS | Status: DC
  Administered 2021-02-23 – 2021-03-02 (×11): 3 mL via INTRAVENOUS

## 2021-02-22 MED ORDER — INSULIN ASPART 100 UNIT/ML IJ SOLN
0.0000 [IU] | Freq: Three times a day (TID) | INTRAMUSCULAR | Status: DC
  Administered 2021-02-22: 2 [IU] via SUBCUTANEOUS
  Administered 2021-02-23 – 2021-02-24 (×3): 3 [IU] via SUBCUTANEOUS
  Administered 2021-02-25: 2 [IU] via SUBCUTANEOUS
  Administered 2021-02-25 – 2021-02-26 (×2): 3 [IU] via SUBCUTANEOUS
  Administered 2021-02-26 – 2021-02-28 (×5): 2 [IU] via SUBCUTANEOUS
  Administered 2021-03-01: 3 [IU] via SUBCUTANEOUS
  Administered 2021-03-02 – 2021-03-03 (×2): 2 [IU] via SUBCUTANEOUS
  Filled 2021-02-22 (×15): qty 1

## 2021-02-22 MED ORDER — SODIUM CHLORIDE 0.9 % IV SOLN
2.0000 g | INTRAVENOUS | Status: DC
  Administered 2021-02-23 – 2021-02-26 (×4): 2 g via INTRAVENOUS
  Filled 2021-02-22 (×2): qty 20
  Filled 2021-02-22 (×2): qty 2
  Filled 2021-02-22 (×2): qty 20

## 2021-02-22 MED ORDER — LORAZEPAM 1 MG PO TABS
1.0000 mg | ORAL_TABLET | Freq: Two times a day (BID) | ORAL | Status: DC | PRN
  Administered 2021-02-24 – 2021-03-01 (×4): 1 mg via ORAL
  Filled 2021-02-22 (×4): qty 1

## 2021-02-22 MED ORDER — SODIUM CHLORIDE 0.9% FLUSH
3.0000 mL | Freq: Two times a day (BID) | INTRAVENOUS | Status: DC
  Administered 2021-02-22 – 2021-03-03 (×12): 3 mL via INTRAVENOUS

## 2021-02-22 MED ORDER — SODIUM CHLORIDE 0.9% FLUSH
3.0000 mL | INTRAVENOUS | Status: DC | PRN
  Administered 2021-02-23 – 2021-03-03 (×2): 3 mL via INTRAVENOUS

## 2021-02-22 MED ORDER — ALBUTEROL SULFATE (2.5 MG/3ML) 0.083% IN NEBU: 3.0000 mL | INHALATION_SOLUTION | RESPIRATORY_TRACT | Status: DC | PRN

## 2021-02-22 MED ORDER — FUROSEMIDE 10 MG/ML IJ SOLN: 40.0000 mg | Freq: Every day | INTRAMUSCULAR | Status: DC

## 2021-02-22 MED ORDER — FUROSEMIDE 10 MG/ML IJ SOLN
20.0000 mg | Freq: Two times a day (BID) | INTRAMUSCULAR | Status: DC
  Administered 2021-02-23 – 2021-02-26 (×7): 20 mg via INTRAVENOUS
  Filled 2021-02-22 (×8): qty 4

## 2021-02-22 MED ORDER — MORPHINE SULFATE (PF) 4 MG/ML IV SOLN
6.0000 mg | Freq: Once | INTRAVENOUS | Status: AC
Start: 2021-02-22 — End: 2021-02-22
  Administered 2021-02-22: 6 mg via INTRAVENOUS
  Filled 2021-02-22: qty 2

## 2021-02-22 MED ORDER — ACETAMINOPHEN 325 MG RE SUPP
650.0000 mg | Freq: Four times a day (QID) | RECTAL | Status: DC | PRN
  Filled 2021-02-22: qty 2

## 2021-02-22 MED ORDER — ONDANSETRON HCL 4 MG/2ML IJ SOLN
4.0000 mg | Freq: Once | INTRAMUSCULAR | Status: AC
  Administered 2021-02-22: 4 mg via INTRAVENOUS
  Filled 2021-02-22: qty 2

## 2021-02-22 MED ORDER — VITAMIN D 25 MCG (1000 UNIT) PO TABS
1000.0000 [IU] | ORAL_TABLET | Freq: Every morning | ORAL | Status: DC
  Administered 2021-02-23 – 2021-03-03 (×8): 1000 [IU] via ORAL
  Filled 2021-02-22 (×7): qty 1

## 2021-02-22 MED ORDER — ASPIRIN EC 81 MG PO TBEC
81.0000 mg | DELAYED_RELEASE_TABLET | Freq: Every day | ORAL | Status: DC
  Administered 2021-02-22 – 2021-03-03 (×10): 81 mg via ORAL
  Filled 2021-02-22 (×11): qty 1

## 2021-02-22 MED ORDER — POLYSACCHARIDE IRON COMPLEX 150 MG PO CAPS
150.0000 mg | ORAL_CAPSULE | Freq: Every day | ORAL | Status: DC
  Administered 2021-02-23 – 2021-03-03 (×9): 150 mg via ORAL
  Filled 2021-02-22 (×12): qty 1

## 2021-02-22 MED ORDER — ACETAMINOPHEN ER 650 MG PO TBCR: 650.0000 mg | EXTENDED_RELEASE_TABLET | Freq: Three times a day (TID) | ORAL | Status: DC | PRN

## 2021-02-22 MED ORDER — VANCOMYCIN HCL IN DEXTROSE 1-5 GM/200ML-% IV SOLN
1000.0000 mg | INTRAVENOUS | Status: DC
  Administered 2021-02-23 – 2021-02-26 (×4): 1000 mg via INTRAVENOUS
  Filled 2021-02-22 (×6): qty 200

## 2021-02-22 MED ORDER — ATORVASTATIN CALCIUM 20 MG PO TABS
40.0000 mg | ORAL_TABLET | Freq: Every day | ORAL | Status: DC
  Administered 2021-02-22 – 2021-03-03 (×10): 40 mg via ORAL
  Filled 2021-02-22 (×10): qty 2

## 2021-02-22 MED ORDER — MEGESTROL ACETATE 20 MG PO TABS
40.0000 mg | ORAL_TABLET | Freq: Every day | ORAL | Status: DC
  Administered 2021-02-23 – 2021-03-03 (×9): 40 mg via ORAL
  Filled 2021-02-22 (×10): qty 2

## 2021-02-22 MED ORDER — AMLODIPINE BESYLATE 10 MG PO TABS
10.0000 mg | ORAL_TABLET | Freq: Every day | ORAL | Status: DC
  Administered 2021-02-22 – 2021-03-03 (×10): 10 mg via ORAL
  Filled 2021-02-22 (×9): qty 1
  Filled 2021-02-22: qty 2

## 2021-02-22 NOTE — ED Triage Notes (Signed)
Pt from in from [facility] [place] for co left lower leg swelling x 3 days. Pt noted to have large amount of swelling to LLE with weeping noted. No shob or injury.

## 2021-02-22 NOTE — Progress Notes (Signed)
PHARMACIST - PHYSICIAN COMMUNICATION  CONCERNING:  Enoxaparin (Lovenox) for DVT Prophylaxis    RECOMMENDATION: Patient was prescribed enoxaprin 40mg  q24 hours for VTE prophylaxis.   Filed Weights   02/22/21 0133  Weight: 99.8 kg (220 lb)    Body mass index is 33.45 kg/m.  Estimated Creatinine Clearance: 47 mL/min (A) (by C-G formula based on SCr of 1.67 mg/dL (H)).   Based on [facility] policy patient is candidate for enoxaparin 0.5mg /kg TBW SQ every 24 hours based on BMI being >30.  Patient is candidate for enoxaparin 30mg  every 24 hours based on CrCl <78ml/min or Weight <45kg  DESCRIPTION: Pharmacy has adjusted enoxaparin dose per [facility] policy.  Patient is now receiving enoxaparin 50 mg every 24 hours    31m, PharmD Clinical Pharmacist  02/22/2021 5:58 PM

## 2021-02-22 NOTE — Consult Note (Signed)
WOC Nurse Consult Note: Patient receiving care in [facility] ED 54. Reason for Consult:chronic LLE ulcers Wound type: chronic ulcers and newly formed fluid filled bullae to LLE. Pressure Injury POA: Yes/No/NA Measurement: Wound bed: Drainage (amount, consistency, odor) serous Periwound: the entire leg from the thigh to the foot is erythematous, and significantly edematous. With just the very lightest finger tip touch to the LLE the patient flinched and guarded the leg. Dressing procedure/placement/frequency:  Place as many Xeroform gauzes over intact LLE fluid filled and ruptured open areas, as necessary to cover all of them. Then place ABD pads over all "blisters" and weeping areas. Beginning behind the toes and going to just below the knee, spiral wrap kerlix. Change daily and prn soilage/saturation. I have also ordered for the use of bilateral Prevalon heel lift boots.  Monitor the wound area(s) for worsening of condition such as: Signs/symptoms of infection,  Increase in size,  Development of or worsening of odor, Development of pain, or increased pain at the affected locations.  Notify the medical team if any of these develop.  WOC nurse will not follow at this time.  Please re-consult the WOC team if needed.  Helmut Muster, RN, MSN, CWOCN, CNS-BC, pager [phone]

## 2021-02-22 NOTE — Progress Notes (Signed)
Pharmacy Antibiotic Note  [name] [name] is a 56 y.o. female admitted on 02/22/2021. Pharmacy has been consulted for cellulitis dosing.  Plan: Vancomycin 2 g IV given in the ED. Follow with vancomycin 1000 mg IV q24h.  Goal AUC 400-550 Expected AUC: 520 SCr used: 1.56  Also on ceftriaxone 2 g IV q24h   Height: 5\' 8"  (172.7 cm) Weight: 99.8 kg (220 lb) IBW/kg (Calculated) : 63.9  Temp (24hrs), Avg:99.5 F (37.5 C), Min:99.2 F (37.3 C), Max:100.4 F (38 C)  Recent Labs  Lab 02/22/21 0111 02/22/21 0112 02/22/21 0516 02/22/21 0840 02/22/21 1855  WBC 15.2*  --   --   --  19.9*  CREATININE  --   --  1.67*  --  1.56*  LATICACIDVEN  --  0.8  --  0.7 1.0    Estimated Creatinine Clearance: 50.4 mL/min (A) (by C-G formula based on SCr of 1.56 mg/dL (H)).    Allergies  Allergen Reactions   Latex Rash    Severe itching.    Antimicrobials this admission: Ceftriaxone 1/3 >> Vancomycin 1/3 >>   Microbiology results: 1/3 BCx: NG pending   Thank you for allowing pharmacy to be a part of this patients care.  Tawnya Crook, PharmD, BCPS Clinical Pharmacist 02/22/2021 9:46 PM

## 2021-02-22 NOTE — ED Provider Notes (Signed)
[facility] Provider Note    Event Date/Time   First MD Initiated Contact with Patient 02/22/21 [id number]     (approximate)   History   Edema   HPI  [name] [name] is a 56 y.o. female here with left leg pain and swelling.  The patient states that over the last 3 to 4 days, she has had progressive worsening left leg swelling and pain.  She has history of recurrent swelling in this leg as well as recurrent cellulitis.  She states the pain began around her knee and has now spread throughout the left leg.  She said associated increased edema.  He said difficulty getting around to the pain and swelling.  Reports warmth, fever, and chills.  Denies any drainage.  Denies any trauma to the area.  No recent falls.  Denies any distal numbness or weakness.     Physical Exam   Triage Vital Signs: ED Triage Vitals  Enc Vitals Group     BP 02/22/21 0132 133/82     Pulse Rate 02/22/21 0132 94     Resp 02/22/21 0132 18     Temp 02/22/21 0132 (!) 100.4 F (38 C)     Temp Source 02/22/21 0132 Oral     SpO2 02/22/21 0132 98 %     Weight 02/22/21 0133 220 lb (99.8 kg)     Height 02/22/21 0133 5\' 8"  (1.727 m)     Head Circumference --      Peak Flow --      Pain Score 02/22/21 0133 0     Pain Loc --      Pain Edu? --      Excl. in [place]? --     Most recent vital signs: Vitals:   02/22/21 1400 02/22/21 1415  BP: (!) 163/85 (!) 169/85  Pulse: 93 95  Resp: 15   Temp:    SpO2: 97% 99%     General: Awake, no distress.  CV:  Good peripheral perfusion.  2+ pitting edema bilateral lower extremities, left more than right. Resp:  Normal effort.  Normal work of breathing.  Bibasilar Rales noted though no respiratory distress, speaking full sentences. Abd:  No distention.  No guarding or rebound.  No tenderness. Other:  Left lower extremity with 3+ pitting edema, diffuse erythema and warmth.  There is are superficial ulcerations involving the left distal foot, though no  open or draining wounds.  There is diffuse warmth, tenderness.  Marked tenderness with any palpation.  No crepitance.   ED Results / Procedures / Treatments   Labs (all labs ordered are listed, but only abnormal results are displayed) Labs Reviewed  COMPREHENSIVE METABOLIC PANEL - Abnormal; Notable for the following components:      Result Value   CO2 19 (*)    Glucose, Bld 133 (*)    BUN 37 (*)    Creatinine, Ser 1.67 (*)    Calcium 7.8 (*)    Albumin 2.2 (*)    AST 58 (*)    GFR, Estimated 36 (*)    All other components within normal limits  CBC WITH DIFFERENTIAL/PLATELET - Abnormal; Notable for the following components:   WBC 15.2 (*)    RBC 2.36 (*)    Hemoglobin 7.2 (*)    HCT 22.4 (*)    Neutro Abs 12.2 (*)    Eosinophils Absolute 0.6 (*)    Abs Immature Granulocytes 0.11 (*)    All other components within normal  limits  BRAIN NATRIURETIC PEPTIDE - Abnormal; Notable for the following components:   B Natriuretic Peptide 286.5 (*)    All other components within normal limits  HEMOGLOBIN AND HEMATOCRIT, BLOOD - Abnormal; Notable for the following components:   Hemoglobin 7.8 (*)    HCT 24.6 (*)    All other components within normal limits  IRON AND TIBC - Abnormal; Notable for the following components:   Iron 12 (*)    TIBC 183 (*)    Saturation Ratios 7 (*)    All other components within normal limits  CULTURE, BLOOD (ROUTINE X 2)  CULTURE, BLOOD (ROUTINE X 2)  RESP PANEL BY RT-PCR (FLU A&B, COVID) ARPGX2  LACTIC ACID, PLASMA  LACTIC ACID, PLASMA  FOLATE  VITAMIN B12  LACTIC ACID, PLASMA  VITAMIN D 25 HYDROXY (VIT D DEFICIENCY, FRACTURES)  HEMOGLOBIN A1C  TYPE AND SCREEN  TROPONIN I (HIGH SENSITIVITY)  TROPONIN I (HIGH SENSITIVITY)     EKG  Normal sinus rhythm, ventricular rate 94.  PR 130, QRS 90, QTc 465.  No acute ST elevations or depressions.  EKG evidence of acute ischemic infarct.   RADIOLOGY Ultrasound: No femoral-popliteal or other DVT.   Reviewed by me, agree with radiology assessment as well.  Left inguinal lymph node noted, likely reactive.    PROCEDURES:  Critical Care performed:   .Critical Care Performed by: Duffy Bruce, MD Authorized by: Duffy Bruce, MD   Critical care provider statement:    Critical care time (minutes):  30   Critical care time was exclusive of:  Separately billable procedures and treating other patients   Critical care was necessary to treat or prevent imminent or life-threatening deterioration of the following conditions:  Cardiac failure, circulatory failure, respiratory failure and sepsis   Critical care was time spent personally by me on the following activities:  Development of treatment plan with patient or surrogate, discussions with consultants, evaluation of patient's response to treatment, examination of patient, ordering and review of laboratory studies, ordering and review of radiographic studies, ordering and performing treatments and interventions, pulse oximetry, re-evaluation of patient's condition and review of old [place] ED: Medications  atorvastatin (LIPITOR) tablet 40 mg (has no administration in time range)  albuterol (PROVENTIL) (2.5 MG/3ML) 0.083% nebulizer solution 3 mL (has no administration in time range)  cholecalciferol (VITAMIN D3) tablet 1,000 Units (has no administration in time range)  clopidogrel (PLAVIX) tablet 75 mg (has no administration in time range)  megestrol (MEGACE) tablet 40 mg (has no administration in time range)  insulin glargine-yfgn (SEMGLEE) injection 12 Units (has no administration in time range)  LORazepam (ATIVAN) tablet 1 mg (has no administration in time range)  amLODipine (NORVASC) tablet 10 mg (has no administration in time range)  aspirin EC tablet 81 mg (has no administration in time range)  escitalopram (LEXAPRO) tablet 10 mg (has no administration in time range)  metoprolol tartrate (LOPRESSOR) tablet  25 mg (has no administration in time range)  multivitamin with minerals tablet 1 tablet (has no administration in time range)  pantoprazole (PROTONIX) EC tablet 40 mg (has no administration in time range)  fluticasone furoate-vilanterol (BREO ELLIPTA) 100-25 MCG/ACT 1 puff (has no administration in time range)  iron polysaccharides (NIFEREX) capsule 150 mg (has no administration in time range)  ascorbic acid (VITAMIN C) tablet 500 mg (has no administration in time range)  furosemide (LASIX) injection 20 mg (has no administration in time range)  acetaminophen (TYLENOL) tablet 650 mg (  has no administration in time range)  insulin aspart (novoLOG) injection 0-15 Units (has no administration in time range)  cefTRIAXone (ROCEPHIN) 2 g in sodium chloride 0.9 % 100 mL IVPB (0 g Intravenous Stopped 02/22/21 0927)  morphine 4 MG/ML injection 6 mg (6 mg Intravenous Given 02/22/21 0851)  ondansetron (ZOFRAN) injection 4 mg (4 mg Intravenous Given 02/22/21 0852)  vancomycin (VANCOREADY) IVPB 2000 mg/400 mL (0 mg Intravenous Stopped 02/22/21 1129)     IMPRESSION / MDM / ASSESSMENT AND PLAN / ED COURSE  I reviewed the triage vital signs and the nursing notes.                              Differential diagnosis includes, but is not limited to, cellulitis, dvt, CHF with asymmetric edema, venous stasis.   56 yo F here with left leg pain, swelling. On exam, pt has significant LLE cellulitis. No signs of abscess or open wound/drainage. Labs show leukocytosis, also likely hypervolemia with elevated BNP and pulm edema on CXR. U/S obtained, reviewed by me and is negative.  CBC shows leukocytosis of 15.2 with mild left shift.  CMP largely at baseline with baseline chronic kidney disease.  BNP elevated at 286 as mentioned.  Plain film shows effusion but no significant pneumonia.  Given leukocytosis, fever, tachycardia, left leg cellulitis, concern for sepsis.  Will start the patient on empiric, broad-spectrum antibiotics  with plan for admission.  She has no crepitance, does not appear toxic, and has no evidence to suggest necrotizing fasciitis.  I reviewed patient's previous ED visits and wound care visits with Dr. 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  Will admit for IV antibiotics, management of sepsis due to cellulitis.      FINAL CLINICAL IMPRESSION(S) / ED DIAGNOSES   Final diagnoses:  Sepsis due to cellulitis ([place])  Left leg cellulitis     Rx / DC Orders   ED Discharge Orders     None        Note:  This document was prepared using Dragon voice recognition software and may include unintentional dictation errors.   Duffy Bruce, MD 02/22/21 1544

## 2021-02-22 NOTE — Plan of Care (Signed)
  Problem: Education: Goal: Knowledge of General Education information will improve Description: Including pain rating scale, medication(s)/side effects and non-pharmacologic comfort measures Outcome: Progressing   Problem: Pain Managment: Goal: General experience of comfort will improve Outcome: Progressing   Problem: Safety: Goal: Ability to remain free from injury will improve Outcome: Progressing   Problem: Skin Integrity: Goal: Risk for impaired skin integrity will decrease Outcome: Progressing   Problem: Education: Goal: Knowledge of General Education information will improve Description: Including pain rating scale, medication(s)/side effects and non-pharmacologic comfort measures Outcome: Progressing   Problem: Pain Managment: Goal: General experience of comfort will improve Outcome: Progressing   Problem: Safety: Goal: Ability to remain free from injury will improve Outcome: Progressing   Problem: Skin Integrity: Goal: Risk for impaired skin integrity will decrease Outcome: Progressing   

## 2021-02-22 NOTE — H&P (Signed)
[facility] History and Physical   Patient: [name] [name] [id number]   PCP: [facility] DOB: [birth year]   DOA: 02/22/2021   DOS: 02/22/2021   DOS: the patient was seen and examined on 02/22/2021  Patient coming from: The patient is coming from Home  Chief Complaint: Left lower extremity cellulitis  HPI: [name] [name] is a 56 y.o. female with Past medical history of HTN, HLD, CVA, COPD, NIDDM T2, depression/anxiety, as reviewed from EMR, presented at [facility] ED with complaining of left lower extremity pain and swelling.  Patient stated that her swelling and pain started from the left knee progressed to the lower extremity over the past 3 to 4 days.  Patient does have history of recurrent cellulitis of the left lower extremity.  Patient has difficulty getting around due to the pain and swelling.  Patient reported fever and chills.  Patient denied any chest pain, no shortness of breath, no palpitations.   ED Course: Vital signs febrile T-max 100.4, hypertensive, and tachycardia Diet, mildly elevated creatinine 1.67, baseline creatinine 1.24--1.45 Low calcium could be secondary to low albumin level BNP 286 slightly elevated, troponin negative Iron deficiency iron saturation 7% Elevated WBC count 15.2, anemia hemoglobin 7.2--7.8 Flu and COVID-negative Venous duplex negative for DVT   Review of Systems: as mentioned in the history of present illness.  All other systems reviewed and are negative.  Past Medical History:  Diagnosis Date   Acute cystitis    Anxiety    Arthritis    CVA (cerebral vascular accident) ([place])    Depression    Diabetes mellitus without complication ([place])    Dyspnea    Fecal incontinence    Hypertension    Incontinence    Incontinence of urine    Stroke ([place]) 2004   x 2   Urinary incontinence    Past Surgical History:  Procedure Laterality Date   COLONOSCOPY WITH PROPOFOL N/A 11/27/2018   Procedure: COLONOSCOPY WITH PROPOFOL;   Surgeon: Toledo, Benay Pike, MD;  Location: [facility] ENDOSCOPY;  Service: Gastroenterology;  Laterality: N/A;   ESOPHAGOGASTRODUODENOSCOPY (EGD) WITH PROPOFOL N/A 11/27/2018   Procedure: ESOPHAGOGASTRODUODENOSCOPY (EGD) WITH PROPOFOL;  Surgeon: Toledo, Benay Pike, MD;  Location: [facility] ENDOSCOPY;  Service: Gastroenterology;  Laterality: N/A;   NO PAST SURGERIES     TOTAL KNEE ARTHROPLASTY Right 10/03/2017   Procedure: TOTAL KNEE ARTHROPLASTY;  Surgeon: Lovell Sheehan, MD;  Location: [facility] ORS;  Service: Orthopedics;  Laterality: Right;   Social History:  reports that she has been smoking cigarettes. She has never used smokeless tobacco. She reports that she does not drink alcohol and does not use drugs.  Allergies  Allergen Reactions   Latex Rash    Severe itching.     Family history reviewed and not pertinent Family History  Problem Relation Age of Onset   Heart disease Mother    Heart disease Father      Prior to Admission medications   Medication Sig Start Date End Date Taking? Authorizing Provider  acetaminophen (TYLENOL) 650 MG CR tablet Take 650 mg by mouth every 8 (eight) hours as needed for pain.    [provider]  albuterol (PROVENTIL HFA;VENTOLIN HFA) 108 (90 Base) MCG/ACT inhaler Inhale 2 puffs into the lungs every 4 (four) hours as needed for wheezing or shortness of breath.    [provider]  amLODipine (NORVASC) 10 MG tablet Take 10 mg by mouth daily. 11/08/20   [provider]  amLODipine (NORVASC) 5 MG  tablet Take 1 tablet (5 mg total) by mouth every morning. 01/08/20 02/07/20  Manuella Ghazi, Pratik D, DO  aspirin EC 81 MG tablet Take 81 mg by mouth daily. Swallow whole.    [provider]  atorvastatin (LIPITOR) 40 MG tablet Take 40 mg by mouth at bedtime. (2000)    [provider]  busPIRone (BUSPAR) 15 MG tablet Take 1 tablet (15 mg total) by mouth 3 (three) times daily. Patient not taking: No sig reported 02/19/20   Connye Burkitt, NP   cholecalciferol (VITAMIN D3) 25 MCG (1000 UT) tablet Take 1,000 Units by mouth every morning.    [provider]  clopidogrel (PLAVIX) 75 MG tablet Take 1 tablet (75 mg total) by mouth daily with breakfast. 10/23/19   Barton Dubois, MD  dexlansoprazole (DEXILANT) 60 MG capsule Take 1 capsule by mouth daily. 11/08/20   [provider]  Dexlansoprazole 30 MG capsule Take 30 mg by mouth daily. Patient not taking: Reported on 11/18/2020    [provider]  diclofenac sodium (VOLTAREN) 1 % GEL Apply 2 g topically 4 (four) times daily as needed. Patient taking differently: Apply 2 g topically in the morning and at bedtime. 09/13/18   Alma Friendly, MD  docusate sodium (COLACE) 100 MG capsule Take 1 capsule (100 mg total) by mouth 2 (two) times daily. Patient not taking: No sig reported 10/22/19   Barton Dubois, MD  escitalopram (LEXAPRO) 10 MG tablet Take 10 mg by mouth daily.    [provider]  ferrous sulfate 325 (65 FE) MG tablet Take 1 tablet (325 mg total) by mouth 2 (two) times daily with a meal. Patient taking differently: Take 325 mg by mouth daily with breakfast. 09/13/18 11/12/20  Alma Friendly, MD  ferrous sulfate 325 (65 FE) MG tablet Take 1 tablet by mouth daily.    [provider]  Fluticasone-Salmeterol (ADVAIR) 100-50 MCG/DOSE AEPB Inhale 1 puff into the lungs 2 (two) times daily.    [provider]  folic acid (FOLVITE) 1 MG tablet Take 1 mg by mouth daily. Patient not taking: No sig reported    [provider]  furosemide (LASIX) 20 MG tablet Take 1 tablet (20 mg total) by mouth 2 (two) times daily. (0800) Patient taking differently: Take 20 mg by mouth daily. (0800) 10/22/19   Barton Dubois, MD  guaiFENesin (ROBITUSSIN) 100 MG/5ML SOLN Take 5 mLs by mouth 3 (three) times daily. Patient not taking: No sig reported    [provider]  HYDROcodone-acetaminophen (NORCO/VICODIN) 5-325 MG tablet Take 1 tablet  by mouth every 4 (four) hours as needed for severe pain. Patient taking differently: Take 1 tablet by mouth 2 (two) times daily. 09/22/20 09/22/21  Duffy Bruce, MD  hydrOXYzine (VISTARIL) 25 MG capsule Take 25 mg by mouth at bedtime.    [provider]  LANTUS SOLOSTAR 100 UNIT/ML Solostar Pen Inject 12 Units into the skin at bedtime. 01/13/20   [provider]  LORazepam (ATIVAN) 1 MG tablet Take 1 mg by mouth 2 (two) times daily as needed for anxiety. 02/09/20   [provider]  megestrol (MEGACE) 40 MG tablet Take 40 mg by mouth daily.    [provider]  metoprolol tartrate (LOPRESSOR) 25 MG tablet Take 25 mg by mouth daily.    [provider]  Multiple Vitamins-Minerals (MULTIVITAMIN WITH MINERALS) tablet Take 1 tablet by mouth daily.    [provider]  nicotine (NICODERM CQ - DOSED IN MG/24  HOURS) 21 mg/24hr patch Place 1 patch (21 mg total) onto the skin daily. Patient not taking: No sig reported 10/23/19   Barton Dubois, MD  pantoprazole (PROTONIX) 40 MG tablet Take 1 tablet (40 mg total) by mouth daily. 09/14/18 10/20/19  Alma Friendly, MD  sodium bicarbonate 650 MG tablet Take 650 mg by mouth daily. Patient not taking: Reported on 11/18/2020 02/17/20   [provider]  sodium chloride 1 g tablet Take 1 tablet by mouth in the morning, at noon, and at bedtime. 11/08/20   [provider]  traMADol (ULTRAM) 50 MG tablet Take 50 mg by mouth 4 (four) times daily.  Patient not taking: No sig reported 09/30/19   [provider]  traZODone (DESYREL) 100 MG tablet Take 100 mg by mouth at bedtime. Patient not taking: No sig reported    [provider]  vitamin B-12 (CYANOCOBALAMIN) 1000 MCG tablet Take 1,000 mcg by mouth daily. Patient not taking: No sig reported    [provider]  zolpidem (AMBIEN) 10 MG tablet Take 10 mg by mouth at bedtime as needed. Patient not taking: No sig reported  02/09/20   [provider]    Physical Exam: Vitals:   02/22/21 1630 02/22/21 1645 02/22/21 1700 02/22/21 1753  BP: (!) 170/84  (!) 163/81 (!) 163/84  Pulse: 96 94 92 92  Resp:    16  Temp:    99.2 F (37.3 C)  TempSrc:      SpO2: 100% 97% 96% 99%  Weight:      Height:        General: alert and oriented to time, place, and person. Appear in mild distress, affect appropriate Eyes: PERRLA, Conjunctiva normal ENT: Oral Mucosa Clear, moist  Neck: no JVD, no Abnormal Mass Or lumps Cardiovascular: S1 and S2 Present, no Murmur, peripheral pulses symmetrical Respiratory: good respiratory effort, Bilateral Air entry equal and Decreased, no signs of accessory muscle use, Clear to Auscultation, no Crackles, no wheezes Abdomen: Bowel Sound present, Soft and no tenderness, no hernia Skin: no rashes, left lower extremity erythema, tenderness and swelling Extremities: b/l Pedal edema, left LLE edema. Swelling, warm and tender, calf tenderness Neurologic: without any new focal findings Gait not checked due to patient safety concerns  Data Reviewed: I have personally reviewed and interpreted labs, imaging as discussed below.  CBC: Recent Labs  Lab 02/22/21 0111 02/22/21 0840  WBC 15.2*  --   NEUTROABS 12.2*  --   HGB 7.2* 7.8*  HCT 22.4* 24.6*  MCV 94.9  --   PLT 305  --    Basic Metabolic Panel: Recent Labs  Lab 02/22/21 0516  NA 137  K 3.8  CL 111  CO2 19*  GLUCOSE 133*  BUN 37*  CREATININE 1.67*  CALCIUM 7.8*   GFR: Estimated Creatinine Clearance: 47 mL/min (A) (by C-G formula based on SCr of 1.67 mg/dL (H)). Liver Function Tests: Recent Labs  Lab 02/22/21 0516  AST 58*  ALT 44  ALKPHOS 82  BILITOT 0.6  PROT 6.7  ALBUMIN 2.2*   No results for input(s): LIPASE, AMYLASE in the last 168 hours. No results for input(s): AMMONIA in the last 168 hours. Coagulation Profile: No results for input(s): INR, PROTIME in the last 168 hours. Cardiac Enzymes: No  results for input(s): CKTOTAL, CKMB, CKMBINDEX, TROPONINI in the last 168 hours. BNP (last 3 results) No results for input(s): PROBNP in the last 8760 hours. HbA1C: No results for input(s): HGBA1C in the  last 72 hours. CBG: Recent Labs  Lab 02/22/21 1720 02/22/21 1802  GLUCAP 135* 124*   Lipid Profile: No results for input(s): CHOL, HDL, LDLCALC, TRIG, CHOLHDL, LDLDIRECT in the last 72 hours. Thyroid Function Tests: No results for input(s): TSH, T4TOTAL, FREET4, T3FREE, THYROIDAB in the last 72 hours. Anemia Panel: Recent Labs    02/22/21 0516 02/22/21 0906  VITAMINB12  --  557  FOLATE 9.7  --   TIBC 183*  --   IRON 12*  --    Urine analysis:    Component Value Date/Time   COLORURINE STRAW (A) 11/18/2020 0902   APPEARANCEUR CLEAR (A) 11/18/2020 0902   LABSPEC 1.009 11/18/2020 0902   PHURINE 6.0 11/18/2020 0902   GLUCOSEU NEGATIVE 11/18/2020 0902   HGBUR NEGATIVE 11/18/2020 0902   BILIRUBINUR NEGATIVE 11/18/2020 0902   KETONESUR NEGATIVE 11/18/2020 0902   PROTEINUR 30 (A) 11/18/2020 0902   NITRITE NEGATIVE 11/18/2020 0902   LEUKOCYTESUR NEGATIVE 11/18/2020 0902    Radiological Exams on Admission: DG Chest 1 View  Result Date: 02/22/2021 CLINICAL DATA:  Initial evaluation for acute bilateral lower extremity swelling. EXAM: CHEST  1 VIEW COMPARISON:  Prior radiograph from 11/18/2020. FINDINGS: Cardiomegaly, stable.  Mediastinal silhouette within normal limits. Lungs hypoinflated. Diffuse vascular and interstitial prominence most suggestive of pulmonary interstitial edema. Probable small bilateral pleural effusions. No definite focal infiltrates or consolidative airspace disease. No pneumothorax. Subacute fracture deformity involving the left humeral head/neck noted. No other acute osseous finding. IMPRESSION: 1. Cardiomegaly with diffuse pulmonary interstitial edema and probable small bilateral pleural effusions, suggesting CHF. 2. Subacute fracture deformity involving the  left humeral head/neck. Electronically Signed   By: Jeannine Boga M.D.   On: 02/22/2021 02:26   DG Knee 2 Views Left  Result Date: 02/22/2021 CLINICAL DATA:  56 year old female with left knee pain and swelling. No known injury. EXAM: LEFT KNEE - 1-2 VIEW COMPARISON:  Left knee series 09/10/2018. FINDINGS: Chronic severe lateral and patellofemoral compartment joint space loss with bulky osteophytosis. Small superimposed suprapatellar joint effusion suspected, but not significantly changed. Bulky medial compartment, and also posterior joint space osteophytosis also although medial compartment joint space better preserved. No acute osseous abnormality identified. Calcified peripheral vascular disease. 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 No acute osseous abnormality identified. Electronically Signed   By: Genevie Ann M.D.   On: 02/22/2021 09:20   US Venous Img Lower Bilateral (DVT)  Result Date: 02/22/2021 CLINICAL DATA:  Bilateral lower extremity swelling EXAM: BILATERAL LOWER EXTREMITY VENOUS DOPPLER ULTRASOUND TECHNIQUE: Gray-scale sonography with compression, as well as color and duplex ultrasound, were performed to evaluate the deep venous system(s) from the level of the common femoral vein through the popliteal and proximal calf veins. COMPARISON:  None. FINDINGS: VENOUS Normal compressibility of the common femoral, superficial femoral, and popliteal veins, as well as the visualized calf veins. Visualized portions of profunda femoral vein and great saphenous vein unremarkable. No filling defects to suggest DVT on grayscale or color Doppler imaging. Doppler waveforms show normal direction of venous flow, normal respiratory plasticity and response to augmentation. OTHER A prominent left inguinal lymph node is identified measuring 4.5 x 2.1 x 2.0 cm demonstrating prominent vascularity. The lymph node,  however, demonstrates a uniformly hypoechoic cortex, no perinodal edema, and a preserved fatty hilum. Altogether, this may represent a reactive lymph node, but is nonspecific. Extensive infrapopliteal subcutaneous edema is noted within the lower extremities bilaterally. Limitations: none IMPRESSION:  No femoropopliteal or visualized infrapopliteal DVT. Prominent left inguinal lymph node, possibly reactive. Electronically Signed   By: Fidela Salisbury M.D.   On: 02/22/2021 04:01   I reviewed all nursing notes, pharmacy notes, vitals, pertinent old records.  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 Baseline creatinine 1.24-1.45 Creatinine 1.67 on admission Continue to monitor renal function, monitor intake and output Use renally dose medications and Avoid nephrotoxic medications   HTN, HLD Resumed amlodipine, metoprolol and statin We will continue monitor BP and titrate medications accordingly  Iron deficiency anemia, started oral iron supplement with vitamin C  History of CVA, on Plavix and statin Depression, continued Zoloft GERD on PPI  Nutrition: Cardiac and Carb modified diet DVT Prophylaxis: Subcutaneous Lovenox  Advance goals of care discussion: Full code   Consults: None   Family Communication: family was present at bedside, at the time of interview.  Opportunity was given to ask question and all questions were answered satisfactorily.  Disposition: Admitted as inpatient, telemetry unit. Likely to be discharged Home with HHPT vs SNF, in 2-3 days.  I have discussed plan of care as described above with RN  and patient/family.  Severity of Illness: The appropriate patient status for this patient is INPATIENT. Inpatient status is judged to be reasonable and necessary in order to provide the required intensity of service to ensure the patient's safety. The patient's presenting symptoms, physical exam findings, and initial radiographic and laboratory data in the context of their chronic comorbidities is felt to place them at high risk for further clinical deterioration. Furthermore, it is not anticipated that the patient will be medically stable for discharge from the hospital within 2 midnights of admission.   * I certify that at the point of admission it is my clinical judgment that the patient will require inpatient hospital care spanning beyond 2 midnights from the point of admission due to high intensity of service, high risk for further deterioration and high frequency of surveillance required.*   Author: Val Riles, MD [facility] 02/22/2021 6:08 PM   To reach On-call, see care teams to locate the attending and reach out to them via [email or web address]. If 7PM-7AM, please contact night-coverage If you still have difficulty reaching the attending provider, please page the [facility] (Director on Call) for [facility] on amion for assistance.

## 2021-02-22 NOTE — Progress Notes (Signed)
PHARMACY -  BRIEF ANTIBIOTIC NOTE   Pharmacy has received consult(s) for Vancomycin from an ED provider.  The patient's profile has been reviewed for ht/wt/allergies/indication/available labs.    One time order(s) placed for Vancomycin 2000mg  Weight= 99.8kg -pt also ordered ceftriaxone  Further antibiotics/pharmacy consults should be ordered by admitting physician if indicated.                       Thank you, Christinia Lambeth A 02/22/2021  8:19 AM

## 2021-02-22 NOTE — Progress Notes (Signed)
Elink following code sepsis °

## 2021-02-22 NOTE — ED Notes (Signed)
Fsbs 135

## 2021-02-22 NOTE — Progress Notes (Addendum)
CODE SEPSIS - PHARMACY COMMUNICATION  **Broad Spectrum Antibiotics should be administered within 1 hour of Sepsis diagnosis**  Time Code Sepsis Called/Page Received: 0757  Antibiotics Ordered: CTX, Vanc  Time of 1st antibiotic administration:  [id number]  Additional action taken by pharmacy: called RN at [phone]- having trouble getting IV- IV team is in pt room currently pe rRN  If necessary, Name of Provider/Nurse Contacted:      Angelique Blonder ,PharmD Clinical Pharmacist  02/22/2021  8:52 AM

## 2021-02-22 NOTE — ED Provider Notes (Signed)
Emergency Medicine Provider Triage Evaluation Note  [name] [name] , a 56 y.o. female  was evaluated in triage.  Pt complains of BLE swelling.  Review of Systems  Positive: BLE swelling Negative: Chest pain, shortness of breath  Physical Exam  BP 133/82 (BP Location: Left Arm)    Pulse 94    Temp (!) 100.4 F (38 C) (Oral)    Resp 18    Ht 5\' 8"  (1.727 m)    Wt 99.8 kg    SpO2 98%    BMI 33.45 kg/m  Gen:   Awake, no distress   Resp:  Normal effort  MSK:   BLE lymphedema with weeping Other:    Medical Decision Making  Medically screening exam initiated at 1:33 AM.  Appropriate orders placed.  [name] [name] was informed that the remainder of the evaluation will be completed by another provider, this initial triage assessment does not replace that evaluation, and the importance of remaining in the ED until their evaluation is complete.  56 year old female presenting with BLE swelling.  Will obtain lab work, imaging studies while patient awaits treatment room.   53, MD 02/22/21 [phone]

## 2021-02-23 DIAGNOSIS — N183 Chronic kidney disease, stage 3 unspecified: Secondary | ICD-10-CM

## 2021-02-23 DIAGNOSIS — L03116 Cellulitis of left lower limb: Secondary | ICD-10-CM | POA: Diagnosis not present

## 2021-02-23 LAB — CBC
HCT: 22.8 % — ABNORMAL LOW (ref 36.0–46.0)
Hemoglobin: 7.4 g/dL — ABNORMAL LOW (ref 12.0–15.0)
MCH: 30.2 pg (ref 26.0–34.0)
MCHC: 32.5 g/dL (ref 30.0–36.0)
MCV: 93.1 fL (ref 80.0–100.0)
Platelets: 331 10*3/uL (ref 150–400)
RBC: 2.45 MIL/uL — ABNORMAL LOW (ref 3.87–5.11)
RDW: 13.7 % (ref 11.5–15.5)
WBC: 15.7 10*3/uL — ABNORMAL HIGH (ref 4.0–10.5)
nRBC: 0 % (ref 0.0–0.2)

## 2021-02-23 LAB — GLUCOSE, CAPILLARY
Glucose-Capillary: 93 mg/dL (ref 70–99)
Glucose-Capillary: 162 mg/dL — ABNORMAL HIGH (ref 70–99)
Glucose-Capillary: 169 mg/dL — ABNORMAL HIGH (ref 70–99)
Glucose-Capillary: 231 mg/dL — ABNORMAL HIGH (ref 70–99)

## 2021-02-23 LAB — BASIC METABOLIC PANEL
Anion gap: 5 (ref 5–15)
BUN: 34 mg/dL — ABNORMAL HIGH (ref 6–20)
CO2: 20 mmol/L — ABNORMAL LOW (ref 22–32)
Calcium: 7.8 mg/dL — ABNORMAL LOW (ref 8.9–10.3)
Chloride: 111 mmol/L (ref 98–111)
Creatinine, Ser: 1.67 mg/dL — ABNORMAL HIGH (ref 0.44–1.00)
GFR, Estimated: 36 mL/min — ABNORMAL LOW (ref >60)
Glucose, Bld: 103 mg/dL — ABNORMAL HIGH (ref 70–99)
Potassium: 3.7 mmol/L (ref 3.5–5.1)
Sodium: 136 mmol/L (ref 135–145)

## 2021-02-23 LAB — MAGNESIUM: Magnesium: 2.1 mg/dL (ref 1.7–2.4)

## 2021-02-23 LAB — PHOSPHORUS: Phosphorus: 3.6 mg/dL (ref 2.5–4.6)

## 2021-02-23 MED ORDER — ENSURE MAX PROTEIN PO LIQD
11.0000 [oz_av] | Freq: Every day | ORAL | Status: DC
  Administered 2021-02-23 – 2021-03-03 (×9): 11 [oz_av] via ORAL

## 2021-02-23 MED ORDER — JUVEN PO PACK
1.0000 | PACK | Freq: Two times a day (BID) | ORAL | Status: DC
  Administered 2021-02-24 – 2021-03-03 (×12): 1 via ORAL

## 2021-02-23 NOTE — TOC Initial Note (Signed)
Transition of Care [facility]) - Initial/Assessment Note    Patient Details  Name: [name] [name] MRN: [id number] Date of Birth: 06-12-1965  Transition of Care [facility]) CM/SW Contact:    Candie Chroman, LCSW Phone Number: 02/23/2021, 12:33 PM  Clinical Narrative:  Readmission prevention screen complete. CSW met with patient. No supports at bedside. CSW introduced role and explained that discharge planning would be discussed. Patient lives at [facility]. PCP is [facility]. She does not see a specific provider there. The ALF transports her to appointments. Pharmacy [facility] No issues obtaining medications. No home health or DME use prior to admission. No further concerns. CSW encouraged patient to contact CSW as needed. CSW will continue to follow patient for support and facilitate return to ALF once medically stable.                Expected Discharge Plan: Assisted Living Barriers to Discharge: Continued Medical Work up   Patient Goals and CMS Choice        Expected Discharge Plan and Services Expected Discharge Plan: Assisted Living     Post Acute Care Choice: NA Living arrangements for the past 2 months: [place]                                      Prior Living Arrangements/Services Living arrangements for the past 2 months: [place] Lives with:: Facility Resident Patient language and need for interpreter reviewed:: Yes Do you feel safe going back to the place where you live?: Yes      Need for Family Participation in Patient Care: Yes (Comment) Care giver support system in place?: Yes (comment)   Criminal Activity/Legal Involvement Pertinent to Current Situation/Hospitalization: No - Comment as needed  Activities of Daily Living Home Assistive Devices/Equipment: Gilford Rile (specify type) ADL Screening (condition at time of admission) Patient's cognitive ability adequate to safely complete daily activities?: Yes Is the  patient deaf or have difficulty hearing?: No Does the patient have difficulty seeing, even when wearing glasses/contacts?: No Does the patient have difficulty concentrating, remembering, or making decisions?: No Patient able to express need for assistance with ADLs?: Yes Does the patient have difficulty dressing or bathing?: No Independently performs ADLs?: Yes (appropriate for developmental age) Does the patient have difficulty walking or climbing stairs?: No Weakness of Legs: None Weakness of Arms/Hands: None  Permission Sought/Granted Permission sought to share information with : Facility Art therapist granted to share information with : Yes, Verbal Permission Granted     Permission granted to share info w AGENCY: [facility]        Emotional Assessment Appearance:: Appears stated age Attitude/Demeanor/Rapport: Engaged Affect (typically observed): Accepting, Calm Orientation: : Oriented to Self, Oriented to Place, Oriented to  Time, Oriented to Situation Alcohol / Substance Use: Not Applicable Psych Involvement: No (comment)  Admission diagnosis:  Cellulitis of left lower extremity [L03.116] Left leg cellulitis [G89.169] Sepsis due to cellulitis ([place]) [L03.90, A41.9] Patient Active Problem List   Diagnosis Date Noted   CKD (chronic kidney disease), stage III ([place]) 02/23/2021   Cellulitis of left lower extremity 45/04/8880   Complicated grief 80/04/4915   Acute lower UTI 01/04/2020   Pressure injury of skin 01/03/2020   Dysphagia 01/03/2020   Hypocalcemia 01/03/2020   Anemia in chronic kidney disease (CKD) 01/03/2020   Sepsis ([place]) 01/02/2020   Type 2  diabetes mellitus with hypoglycemia without coma ([place]) 01/02/2020   AKI (acute kidney injury) ([place]) 01/01/2020   Hyperlipidemia    Essential hypertension    Depression with anxiety    Chronic obstructive pulmonary disease (HCC)    Tobacco abuse 10/21/2019   Sensory disturbance 10/21/2019   TIA  (transient ischemic attack) 10/20/2019   Altered mental status    Loss of consciousness ([place]) 09/08/2018   Encephalopathy 09/08/2018   S/P TKR (total knee replacement) using cement, 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   Anxiety 04/02/2017   Urinary incontinence 01/25/2016   Fecal incontinence 01/25/2016   Dysthymia 01/25/2016   PCP:  [facility]:   [place], [place] - [address] [place] [address] Phone: [phone] Fax: [phone]  [facility] - [place], [place] [address] Phone: [phone] Fax: [phone]     Social Determinants of Health (SDOH) Interventions    Readmission Risk Interventions Readmission Risk Prevention Plan 02/23/2021 01/02/2020  Transportation Screening Complete Complete  HRI or [place] - Complete  Social Work Consult for [place] Planning/Counseling - Complete  Palliative Care Screening - Not Applicable  Medication Review Press photographer) Complete Complete  PCP or Specialist appointment within 3-5 days of discharge Complete -  SW Recovery Care/Counseling Consult Complete -  Palliative Care Screening Not Applicable -  [place] Not Applicable -  Some recent data might be hidden

## 2021-02-23 NOTE — Progress Notes (Signed)
PROGRESS NOTE    [name] HohJennifer R Gladman  ZYS:[id number] DOB: 1966/01/16 DOA: 02/22/2021 [place]: [place], [facility]   Chief Complaint  Patient presents with   Edema  Brief Narrative/Hospital Course: [name] [name], 56 y.o. female with PMH of  HTN, HLD, CVA, COPD, NIDDM T2, depression/anxiety, history of recurrent cellulitis of left lower extremity P/W  Lt LE pain and swelling, started from the left knee progressed to the lower extremity x 3 to 4 days. In ED-t-max 100.4, hypertensive, and tachycardiC, Creat 1.67, baseline creatinine 1.24--1.45, leukocytosis COVID-19 negative duplex negative for DVT Admitted for IV antibiotics  Subjective: Seen and examined this morning.  She reports left leg feels better she has multiple areas of blisters on the left leg-mild tenderness no erythema. No fever overnight Blood pressure 130s to 140s Wbc improving.  Assessment & Plan:  LLE cellulitis with blisters History of recurrent LLE cellulitis: On vancomycin and ceftriaxone.  We will continue the same, WBC count downtrending.  Blood cultures sent 1/3 pending.  Management discussed with ID  Essential hypertension: Well-controlled on amlodipine and metoprolol  HLD History of CVA: On Plavix and statin, continue the same  Gerd ON PPI  Anemia multifactorial mild deficiency and chronic disease: started  on iron supplement.  Started outpatient follow-up with PCP for routine cancer screening/colonoscopy if not done previously-her hemoglobin has been ranging from 7-10 gm past 2-3 yrs Recent Labs  Lab 02/22/21 0111 02/22/21 0840 02/22/21 1855 02/23/21 0408  HGB 7.2* 7.8* 8.3* 7.4*  HCT 22.4* 24.6* 25.4* 22.8*    Depression with anxiety: Her mood is stable continue Lexapro CKD IIIb:Baseline creatinine 1.4-1.9 in 2022: Overall creatinine at 1.6 range which appears to be chronic and baseline.  Currently no AKI Recent Labs  Lab 02/22/21 0516 02/22/21 1855 02/23/21 0408  BUN 37*  --  34*   CREATININE 1.67* 1.56* 1.67*    S/P TKR Rt-  Class I Obesity:Patient's Body mass index is 33.45 kg/m. : Will benefit with PCP follow-up, weight loss  healthy lifestyle and outpatient sleep evaluation.  DVT prophylaxis:   Lovenox Code Status:   Code Status: Full Code Family Communication: plan of care discussed with patient at bedside. Status is: Inpatient Remains inpatient appropriate because: For IV antibiotics Disposition: Currently no medically stable for discharge. Anticipated Disposition: TBD  Objective: Vitals last 24 hrs: Vitals:   02/22/21 2007 02/23/21 0027 02/23/21 0442 02/23/21 0736  BP: 138/75 132/70 (!) 148/76 (!) 158/82  Pulse: 88 94 95 99  Resp: 18 20 20 20   Temp: 99.3 F (37.4 C) 99.9 F (37.7 C) 99.6 F (37.6 C) 99.3 F (37.4 C)  TempSrc:    Oral  SpO2: 98% 95% 95% 98%  Weight:      Height:       Weight change:   Intake/Output Summary (Last 24 hours) at 02/23/2021 1125 Last data filed at 02/23/2021 0900 Gross per 24 hour  Intake 467.21 ml  Output --  Net 467.21 ml   Net IO Since Admission: 467.21 mL [02/23/21 1125]   Physical Examination: General exam: AA oriented x3, obese,, weak,older than stated age. HEENT:Oral mucosa moist, Ear/Nose WNL grossly,dentition normal. Respiratory system: B/l clear BS, no use of accessory muscle, non tender. Cardiovascular system: S1 & S2 +,No JVD. Gastrointestinal system: Abdomen soft, NT,ND, BS+. Nervous System:Alert, awake, moving extremities. Extremities: edema on left lower extremity with blisters, distal peripheral pulses palpable.  Skin: No rashes, no icterus. MSK: Normal muscle bulk, tone, power.   Medications reviewed:  Scheduled  Meds:  amLODipine  10 mg Oral Daily   vitamin C  500 mg Oral Daily   aspirin EC  81 mg Oral Daily   atorvastatin  40 mg Oral QHS   cholecalciferol  1,000 Units Oral q morning   clopidogrel  75 mg Oral Q breakfast   enoxaparin (LOVENOX) injection  0.5 mg/kg Subcutaneous Q24H    escitalopram  10 mg Oral Daily   fluticasone furoate-vilanterol  1 puff Inhalation Daily   furosemide  20 mg Intravenous BID   insulin aspart  0-15 Units Subcutaneous TID WC   insulin glargine-yfgn  12 Units Subcutaneous QHS   iron polysaccharides  150 mg Oral Daily   megestrol  40 mg Oral Daily   metoprolol tartrate  25 mg Oral Daily   multivitamin with minerals  1 tablet Oral Daily   pantoprazole  40 mg Oral Daily   sodium chloride flush  3 mL Intravenous Q12H   sodium chloride flush  3 mL Intravenous Q12H   Continuous Infusions:  sodium chloride     cefTRIAXone (ROCEPHIN)  IV 2 g (02/23/21 0926)   vancomycin 1,000 mg (02/23/21 1111)   Diet Order             Diet heart healthy/carb modified Room service appropriate? Yes; Fluid consistency: Thin  Diet effective now                   Weight change:   Wt Readings from Last 3 Encounters:  02/22/21 99.8 kg  01/27/21 98 kg  01/23/21 98 kg   Consultants:see note  Procedures:see note Antimicrobials: Anti-infectives (From admission, onward)    Start     Dose/Rate Route Frequency Ordered Stop   02/23/21 1000  vancomycin (VANCOCIN) IVPB 1000 mg/200 mL premix        1,000 mg 200 mL/hr over 60 Minutes Intravenous Every 24 hours 02/22/21 2142     02/23/21 0800  cefTRIAXone (ROCEPHIN) 2 g in sodium chloride 0.9 % 100 mL IVPB        2 g 200 mL/hr over 30 Minutes Intravenous Every 24 hours 02/22/21 2138     02/22/21 0830  vancomycin (VANCOREADY) IVPB 2000 mg/400 mL        2,000 mg 200 mL/hr over 120 Minutes Intravenous STAT 02/22/21 0819 02/22/21 1129   02/22/21 0800  cefTRIAXone (ROCEPHIN) 2 g in sodium chloride 0.9 % 100 mL IVPB        2 g 200 mL/hr over 30 Minutes Intravenous  Once 02/22/21 0759 02/22/21 0927      Culture/Microbiology    Component Value Date/Time   SDES BLOOD BLOOD RIGHT WRIST 02/22/2021 0516   SPECREQUEST  02/22/2021 0516    BOTTLES DRAWN AEROBIC AND ANAEROBIC Blood Culture adequate volume    CULT  02/22/2021 0516    NO GROWTH 1 DAY Performed at [facility], [address] [place] [place], [place] [address]    REPTSTATUS PENDING 02/22/2021 8588    Other culture-see note  Unresulted Labs (From admission, onward)     Start     Ordered   03/01/21 0500  Creatinine, serum  (enoxaparin (LOVENOX)    CrCl >/= 30 ml/min)  Weekly,   STAT     Comments: while on enoxaparin therapy    02/22/21 1754   02/23/21 0500  Basic metabolic panel  Daily,   STAT      02/22/21 0909   02/23/21 0500  CBC  Daily,   STAT  02/22/21 0909   02/23/21 0500  Magnesium  Daily,   STAT      02/22/21 0909   02/23/21 0500  Phosphorus  Daily,   STAT      02/22/21 0909          Data Reviewed: I have personally reviewed following labs and imaging studies CBC: Recent Labs  Lab 02/22/21 0111 02/22/21 0840 02/22/21 1855 02/23/21 0408  WBC 15.2*  --  19.9* 15.7*  NEUTROABS 12.2*  --   --   --   HGB 7.2* 7.8* 8.3* 7.4*  HCT 22.4* 24.6* 25.4* 22.8*  MCV 94.9  --  93.0 93.1  PLT 305  --  357 331   Basic Metabolic Panel: Recent Labs  Lab 02/22/21 0516 02/22/21 1855 02/23/21 0408  NA 137  --  136  K 3.8  --  3.7  CL 111  --  111  CO2 19*  --  20*  GLUCOSE 133*  --  103*  BUN 37*  --  34*  CREATININE 1.67* 1.56* 1.67*  CALCIUM 7.8*  --  7.8*  MG  --   --  2.1  PHOS  --   --  3.6   GFR: Estimated Creatinine Clearance: 47 mL/min (A) (by C-G formula based on SCr of 1.67 mg/dL (H)). Liver Function Tests: Recent Labs  Lab 02/22/21 0516  AST 58*  ALT 44  ALKPHOS 82  BILITOT 0.6  PROT 6.7  ALBUMIN 2.2*   No results for input(s): LIPASE, AMYLASE in the last 168 hours. No results for input(s): AMMONIA in the last 168 hours. Coagulation Profile: No results for input(s): INR, PROTIME in the last 168 hours. Cardiac Enzymes: No results for input(s): CKTOTAL, CKMB, CKMBINDEX, TROPONINI in the last 168 hours. BNP (last 3 results) No results for input(s): PROBNP in the last 8760  hours. HbA1C: Recent Labs    02/22/21 1855  HGBA1C 6.3*   CBG: Recent Labs  Lab 02/22/21 1720 02/22/21 1802 02/22/21 2110 02/23/21 0738  GLUCAP 135* 124* 144* 93   Lipid Profile: No results for input(s): CHOL, HDL, LDLCALC, TRIG, CHOLHDL, LDLDIRECT in the last 72 hours. Thyroid Function Tests: No results for input(s): TSH, T4TOTAL, FREET4, T3FREE, THYROIDAB in the last 72 hours. Anemia Panel: Recent Labs    02/22/21 0516 02/22/21 0906  VITAMINB12  --  557  FOLATE 9.7  --   TIBC 183*  --   IRON 12*  --    Sepsis Labs: Recent Labs  Lab 02/22/21 0112 02/22/21 0840 02/22/21 1855  LATICACIDVEN 0.8 0.7 1.0    Recent Results (from the past 240 hour(s))  Blood culture (routine x 2)     Status: None (Preliminary result)   Collection Time: 02/22/21  1:33 AM   Specimen: BLOOD  Result Value Ref Range Status   Specimen Description BLOOD LEFT FOREARM  Final   Special Requests   Final    BOTTLES DRAWN AEROBIC AND ANAEROBIC Blood Culture results may not be optimal due to an inadequate volume of blood received in culture bottles   Culture   Final    NO GROWTH 1 DAY Performed at [facility], [address] [place], [place] [address]    Report Status PENDING  Incomplete  Blood culture (routine x 2)     Status: None (Preliminary result)   Collection Time: 02/22/21  5:16 AM   Specimen: BLOOD  Result Value Ref Range Status   Specimen Description BLOOD BLOOD RIGHT WRIST  Final  Special Requests   Final    BOTTLES DRAWN AEROBIC AND ANAEROBIC Blood Culture adequate volume   Culture   Final    NO GROWTH 1 DAY Performed at [facility], [address] [place], [place] [address]    Report Status PENDING  Incomplete  Resp Panel by RT-PCR (Flu A&B, Covid) Nasopharyngeal Swab     Status: None   Collection Time: 02/22/21  8:40 AM   Specimen: Nasopharyngeal Swab; Nasopharyngeal(NP) swabs in vial transport medium  Result Value Ref Range Status   SARS  Coronavirus 2 by RT PCR NEGATIVE NEGATIVE Final    Comment: (NOTE) SARS-CoV-2 target nucleic acids are NOT DETECTED.  The SARS-CoV-2 RNA is generally detectable in upper respiratory specimens during the acute phase of infection. The lowest concentration of SARS-CoV-2 viral copies this assay can detect is 138 copies/mL. A negative result does not preclude SARS-Cov-2 infection and should not be used as the sole basis for treatment or other patient management decisions. A negative result may occur with  improper specimen collection/handling, submission of specimen other than nasopharyngeal swab, presence of viral mutation(s) within the areas targeted by this assay, and inadequate number of viral copies(<138 copies/mL). A negative result must be combined with clinical observations, patient history, and epidemiological information. The expected result is Negative.  Fact Sheet for Patients:  BloggerCourse.com  Fact Sheet for Healthcare Providers:  SeriousBroker.it  This test is no t yet approved or cleared by the [place] FDA and  has been authorized for detection and/or diagnosis of SARS-CoV-2 by FDA under an Emergency Use Authorization (EUA). This EUA will remain  in effect (meaning this test can be used) for the duration of the COVID-19 declaration under Section 564(b)(1) of the Act, 21 U.S.C.section 360bbb-3(b)(1), unless the authorization is terminated  or revoked sooner.       Influenza A by PCR NEGATIVE NEGATIVE Final   Influenza B by PCR NEGATIVE NEGATIVE Final    Comment: (NOTE) The Xpert Xpress SARS-CoV-2/FLU/RSV plus assay is intended as an aid in the diagnosis of influenza from Nasopharyngeal swab specimens and should not be used as a sole basis for treatment. Nasal washings and aspirates are unacceptable for Xpert Xpress SARS-CoV-2/FLU/RSV testing.  Fact Sheet for  Patients: BloggerCourse.com  Fact Sheet for Healthcare Providers: SeriousBroker.it  This test is not yet approved or cleared by the [place] FDA and has been authorized for detection and/or diagnosis of SARS-CoV-2 by FDA under an Emergency Use Authorization (EUA). This EUA will remain in effect (meaning this test can be used) for the duration of the COVID-19 declaration under Section 564(b)(1) of the Act, 21 U.S.C. section 360bbb-3(b)(1), unless the authorization is terminated or revoked.  Performed at [facility], [address] [place], [place] [address]      Radiology Studies: DG Chest 1 View  Result Date: 02/22/2021 CLINICAL DATA:  Initial evaluation for acute bilateral lower extremity swelling. EXAM: CHEST  1 VIEW COMPARISON:  Prior radiograph from 11/18/2020. FINDINGS: Cardiomegaly, stable.  Mediastinal silhouette within normal limits. Lungs hypoinflated. Diffuse vascular and interstitial prominence most suggestive of pulmonary interstitial edema. Probable small bilateral pleural effusions. No definite focal infiltrates or consolidative airspace disease. No pneumothorax. Subacute fracture deformity involving the left humeral head/neck noted. No other acute osseous finding. IMPRESSION: 1. Cardiomegaly with diffuse pulmonary interstitial edema and probable small bilateral pleural effusions, suggesting CHF. 2. Subacute fracture deformity involving the left humeral head/neck. Electronically Signed   By: Rise Mu M.D.   On:  02/22/2021 02:26   DG Knee 2 Views Left  Result Date: 02/22/2021 CLINICAL DATA:  56 year old female with left knee pain and swelling. No known injury. EXAM: LEFT KNEE - 1-2 VIEW COMPARISON:  Left knee series 09/10/2018. FINDINGS: Chronic severe lateral and patellofemoral compartment joint space loss with bulky osteophytosis. Small superimposed suprapatellar joint effusion suspected, but not  significantly changed. Bulky medial compartment, and also posterior joint space osteophytosis also although medial compartment joint space better preserved. No acute osseous abnormality identified. Calcified peripheral vascular disease. 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 No acute osseous abnormality identified. Electronically Signed   By: Odessa Fleming M.D.   On: 02/22/2021 09:20   US Venous Img Lower Bilateral (DVT)  Result Date: 02/22/2021 CLINICAL DATA:  Bilateral lower extremity swelling EXAM: BILATERAL LOWER EXTREMITY VENOUS DOPPLER ULTRASOUND TECHNIQUE: Gray-scale sonography with compression, as well as color and duplex ultrasound, were performed to evaluate the deep venous system(s) from the level of the common femoral vein through the popliteal and proximal calf veins. COMPARISON:  None. FINDINGS: VENOUS Normal compressibility of the common femoral, superficial femoral, and popliteal veins, as well as the visualized calf veins. Visualized portions of profunda femoral vein and great saphenous vein unremarkable. No filling defects to suggest DVT on grayscale or color Doppler imaging. Doppler waveforms show normal direction of venous flow, normal respiratory plasticity and response to augmentation. OTHER A prominent left inguinal lymph node is identified measuring 4.5 x 2.1 x 2.0 cm demonstrating prominent vascularity. The lymph node, however, demonstrates a uniformly hypoechoic cortex, no perinodal edema, and a preserved fatty hilum. Altogether, this may represent a reactive lymph node, but is nonspecific. Extensive infrapopliteal subcutaneous edema is noted within the lower extremities bilaterally. Limitations: none IMPRESSION: No femoropopliteal or visualized infrapopliteal DVT. Prominent left inguinal lymph node, possibly reactive. Electronically Signed   By: Helyn Numbers M.D.   On: 02/22/2021 04:01      LOS: 1 day   Lanae Boast, MD [facility]  02/23/2021, 11:25 AM

## 2021-02-23 NOTE — Progress Notes (Signed)
Initial Nutrition Assessment  DOCUMENTATION CODES:   Obesity unspecified  INTERVENTION:   -1 packet Juven BID, each packet provides 95 calories, 2.5 grams of protein (collagen), and 9.8 grams of carbohydrate (3 grams sugar); also contains 7 grams of L-arginine and L-glutamine, 300 mg vitamin C, 15 mg vitamin E, 1.2 mcg vitamin B-12, 9.5 mg zinc, 200 mg calcium, and 1.5 g  Calcium Beta-hydroxy-Beta-methylbutyrate to support wound healing  -Ensure Max po daily, each supplement provides 150 kcal and 30 grams of protein -MVI with minerals daily   NUTRITION DIAGNOSIS:   Increased nutrient needs related to wound healing as evidenced by estimated needs.  GOAL:   Patient will meet greater than or equal to 90% of their needs  MONITOR:   PO intake, Supplement acceptance, Labs, Weight trends, Skin, I & O's  REASON FOR ASSESSMENT:   Malnutrition Screening Tool    ASSESSMENT:   [name] [name] is a 56 y.o. female with Past medical history of HTN, HLD, CVA, COPD, NIDDM T2, depression/anxiety, as reviewed from EMR, presented at [facility] ED with complaining of left lower extremity pain and swelling.  Patient stated that her swelling and pain started from the left knee progressed to the lower extremity over the past 3 to 4 days.  Patient does have history of recurrent cellulitis of the left lower extremity.  Patient has difficulty getting around due to the pain and swelling.  Patient reported fever and chills.  Patient denied any chest pain, no shortness of breath, no palpitations.  Pt admitted with lt lower extremity cellulitis.    Reviewed I/O's: +347 ml x 24 hours  Spoke with pt at bedside, who reports feeling a little better today, but complains of leg pain. She reports fair appetite- estimates she consumed about 80% of breakfast. Documented meal completions 100%. Per pt, she was consuming 2 meals per day (which consisted of a protein) over the past two months due to decreased appetite.    Per pt, she estimates she has lost 6-8# over the past 2 months. Reviewed wt hx; wt has been stable over the past 6 months.   Discussed importance of good meal and supplement intake to promote healing. Pt amenable to oral nutrition supplements.   Medications reviewed and include vitamin C, vitamin D3, lasix, and megace.   Lab Results  Component Value Date   HGBA1C 6.3 (H) 02/22/2021   PTA DM medications are none.   Labs reviewed: CBGS: 93-162 (inpatient orders for glycemic control are 0-15 units insulin aspart TID with meals and 12 units insulin glargine-yfgn daily).    NUTRITION - FOCUSED PHYSICAL EXAM:  Flowsheet Row Most Recent Value  Orbital Region No depletion  Upper Arm Region Mild depletion  Thoracic and Lumbar Region No depletion  Buccal Region No depletion  Temple Region No depletion  Clavicle Bone Region No depletion  Clavicle and Acromion Bone Region No depletion  Scapular Bone Region No depletion  Dorsal Hand No depletion  Patellar Region Mild depletion  Anterior Thigh Region Mild depletion  Posterior Calf Region Mild depletion  Edema (RD Assessment) None  Hair Reviewed  Eyes Reviewed  Mouth Reviewed  Skin Reviewed  Nails Reviewed       Diet Order:   Diet Order             Diet heart healthy/carb modified Room service appropriate? Yes; Fluid consistency: Thin  Diet effective now  EDUCATION NEEDS:   Education needs have been addressed  Skin:  Skin Assessment: Skin Integrity Issues: Skin Integrity Issues:: Other (Comment) Other: open wound to lt pretibial  Last BM:  02/22/21  Height:   Ht Readings from Last 1 Encounters:  02/22/21 5\' 8"  (1.727 m)    Weight:   Wt Readings from Last 1 Encounters:  02/22/21 99.8 kg    Ideal Body Weight:  63.6 kg  BMI:  Body mass index is 33.45 kg/m.  Estimated Nutritional Needs:   Kcal:  1900-2100  Protein:  110-125 grams  Fluid:  > 1.9 L    04/22/21, RD, LDN,  CDCES Registered Dietitian II Certified Diabetes Care and Education Specialist Please refer to [facility] for RD and/or RD on-call/weekend/after hours pager

## 2021-02-24 ENCOUNTER — Encounter: Payer: Self-pay | Admitting: Student

## 2021-02-24 DIAGNOSIS — L03116 Cellulitis of left lower limb: Secondary | ICD-10-CM | POA: Diagnosis not present

## 2021-02-24 LAB — BASIC METABOLIC PANEL
Anion gap: 6 (ref 5–15)
BUN: 38 mg/dL — ABNORMAL HIGH (ref 6–20)
CO2: 21 mmol/L — ABNORMAL LOW (ref 22–32)
Calcium: 7.8 mg/dL — ABNORMAL LOW (ref 8.9–10.3)
Chloride: 111 mmol/L (ref 98–111)
Creatinine, Ser: 1.56 mg/dL — ABNORMAL HIGH (ref 0.44–1.00)
GFR, Estimated: 39 mL/min — ABNORMAL LOW (ref >60)
Glucose, Bld: 104 mg/dL — ABNORMAL HIGH (ref 70–99)
Potassium: 3.9 mmol/L (ref 3.5–5.1)
Sodium: 138 mmol/L (ref 135–145)

## 2021-02-24 LAB — GLUCOSE, CAPILLARY
Glucose-Capillary: 102 mg/dL — ABNORMAL HIGH (ref 70–99)
Glucose-Capillary: 172 mg/dL — ABNORMAL HIGH (ref 70–99)
Glucose-Capillary: 103 mg/dL — ABNORMAL HIGH (ref 70–99)
Glucose-Capillary: 158 mg/dL — ABNORMAL HIGH (ref 70–99)
Glucose-Capillary: 163 mg/dL — ABNORMAL HIGH (ref 70–99)

## 2021-02-24 LAB — CBC
HCT: 22.2 % — ABNORMAL LOW (ref 36.0–46.0)
Hemoglobin: 7.2 g/dL — ABNORMAL LOW (ref 12.0–15.0)
MCH: 29.6 pg (ref 26.0–34.0)
MCHC: 32.4 g/dL (ref 30.0–36.0)
MCV: 91.4 fL (ref 80.0–100.0)
Platelets: 343 10*3/uL (ref 150–400)
RBC: 2.43 MIL/uL — ABNORMAL LOW (ref 3.87–5.11)
RDW: 13.4 % (ref 11.5–15.5)
WBC: 13.7 10*3/uL — ABNORMAL HIGH (ref 4.0–10.5)
nRBC: 0 % (ref 0.0–0.2)

## 2021-02-24 NOTE — Evaluation (Addendum)
Occupational Therapy Evaluation Patient Details Name: [name] [name] MRN: [id number] DOB: 04-20-1965 Today's Date: 02/24/2021   History of Present Illness Pt is a 56 year old female admitted with LLE pain and swelling started from the L knee progressed to LLE x3 to 4 days. Admitted on IV antibiotics. PMH include HTN, HLD, CVA, COPD, NIDDM T2, depression/anxiety, history of recurrent cellulitis   Clinical Impression   Chart reviewed, pt greeted in bed agreeable to OT evaluation. Co-tx completed with PT for pt/ therapist safety. Pt required MOD A for supine>sit at edge of bed. STS with MOD A x2, sustained standing for peri care with heavy reliance on RW for support. TOTAL assist for LB dressing and peri care following BM. Pt completes SPT with MOD A.Per pt report she appears to be functioning below PLOF for ADL completion. Pt is left in chair, NAD, all needs met. OT recommends discharge to STR to address functional deficits, will continue to follow while admitted.      Recommendations for follow up therapy are one component of a multi-disciplinary discharge planning process, led by the attending physician.  Recommendations may be updated based on patient status, additional functional criteria and insurance authorization.   Follow Up Recommendations  Skilled nursing-short term rehab (<3 hours/day)    Assistance Recommended at Discharge Frequent or constant Supervision/Assistance  Patient can return home with the following A lot of help with bathing/dressing/bathroom;A lot of help with walking and/or transfers    Functional Status Assessment  Patient has had a recent decline in their functional status and demonstrates the ability to make significant improvements in function in a reasonable and predictable amount of time.  Equipment Recommendations   (per next venue of care)    Recommendations for Other Services       Precautions / Restrictions Precautions Precautions: Fall       Mobility Bed Mobility Overal bed mobility: Needs Assistance Bed Mobility: Supine to Sit     Supine to sit: Mod assist          Transfers Overall transfer level: Needs assistance Equipment used: Rolling walker (2 wheels) Transfers: Sit to/from Stand;Bed to chair/wheelchair/BSC Sit to Stand: Mod assist;+2 physical assistance Stand pivot transfers: +2 physical assistance;Mod assist                Balance Overall balance assessment: Needs assistance Sitting-balance support: Feet supported Sitting balance-Leahy Scale: Good     Standing balance support: Bilateral upper extremity supported;During functional activity;Reliant on assistive device for balance Standing balance-Leahy Scale: Poor                             ADL either performed or assessed with clinical judgement   ADL Overall ADL's : Needs assistance/impaired Eating/Feeding: Set up   Grooming: Wash/dry hands;Wash/dry face;Sitting;Set up       Lower Body Bathing: Total assistance       Lower Body Dressing: Total assistance Lower Body Dressing Details (indicate cue type and reason): socks Toilet Transfer: Moderate assistance;+2 for physical assistance Toilet Transfer Details (indicate cue type and reason): simulated to bedside chair Toileting- Clothing Manipulation and Hygiene: Total assistance;+2 for physical assistance Toileting - Clothing Manipulation Details (indicate cue type and reason): for peri care following BM     Functional mobility during ADLs: Moderate assistance;+2 for physical assistance;+2 for safety/equipment        Pertinent Vitals/Pain Pain Assessment: Faces Faces Pain Scale: Hurts even more Pain Location: LLE  Pain Descriptors / Indicators: Discomfort;Moaning Pain Intervention(s): Monitored during session;Limited activity within patient's tolerance;Repositioned        Extremity/Trunk Assessment Upper Extremity Assessment Upper Extremity Assessment: Overall WFL  for tasks assessed   Lower Extremity Assessment Lower Extremity Assessment: Generalized weakness       Communication     Cognition Arousal/Alertness: Awake/alert Behavior During Therapy: Flat affect Overall Cognitive Status: No family/caregiver present to determine baseline cognitive functioning Area of Impairment: Orientation;Following commands                 Orientation Level: Person;Place;Time;Situation     Following Commands: Follows one step commands with increased time;Follows multi-step commands inconsistently       General Comments: pt is alert and oriented x4, however fair biographical informant and increased time required for direction following; fair insight     General Comments  LLE wound wrapped in bandage; IV noted to be leaking prior to session starting, RN removed    Exercises     Shoulder Instructions      Home Living Family/patient expects to be discharged to:: Assisted living                                 Additional Comments: pt reports she lives at [facility]      Prior Functioning/Environment Prior Level of Function : Needs assist       Physical Assist : ADLs (physical)   ADLs (physical): IADLs Mobility Comments: pt reports she uses a rollator ADLs Comments: Pt reports she is MOD I with ADL, assist with IADL; information will need to be clarified        OT Problem List: Decreased strength;Impaired balance (sitting and/or standing);Pain;Decreased safety awareness;Decreased activity tolerance;Decreased knowledge of use of DME or AE      OT Treatment/Interventions: Self-care/ADL training;DME and/or AE instruction;Therapeutic activities;Balance training;Therapeutic exercise;Modalities;Patient/family education    OT Goals(Current goals can be found in the care plan section) Acute Rehab OT Goals Patient Stated Goal: to be in less pain OT Goal Formulation: With patient Time For Goal Achievement: 03/10/21 Potential to  Achieve Goals: Good ADL Goals Pt Will Perform Grooming: with supervision;sitting;standing Pt Will Perform Lower Body Dressing: sit to/from stand;with min assist Pt Will Transfer to Toilet: with supervision Pt Will Perform Toileting - Clothing Manipulation and hygiene: with min assist  OT Frequency: Min 2X/week    Co-evaluation PT/OT/SLP Co-Evaluation/Treatment: Yes Reason for Co-Treatment: Necessary to address cognition/behavior during functional activity;For patient/therapist safety;To address functional/ADL transfers   OT goals addressed during session: ADL's and self-care      AM-PAC OT "6 Clicks" Daily Activity     Outcome Measure Help from another person eating meals?: None Help from another person taking care of personal grooming?: A Little Help from another person toileting, which includes using toliet, bedpan, or urinal?: Total Help from another person bathing (including washing, rinsing, drying)?: A Lot Help from another person to put on and taking off regular upper body clothing?: A Little Help from another person to put on and taking off regular lower body clothing?: Total 6 Click Score: 14   End of Session Equipment Utilized During Treatment: Gait belt;Rolling walker (2 wheels) Nurse Communication: Mobility status  Activity Tolerance: Patient limited by pain Patient left: in chair;with call bell/phone within reach;with chair alarm set  OT Visit Diagnosis: Unsteadiness on feet (R26.81);Other abnormalities of gait and mobility (R26.89)  Time: 1415-1447 °OT Time Calculation (min): 32 min °Charges:  OT General Charges °$OT Visit: 1 Visit °OT Evaluation °$OT Eval High Complexity: 1 High °OT Treatments °$Self Care/Home Management : 8-22 mins ° ° , OTD OTR/L  °02/24/21, 3:10 PM  °

## 2021-02-24 NOTE — Evaluation (Signed)
Physical Therapy Evaluation Patient Details Name: [name] [name] MRN: [id number] DOB: May 28, 1965 Today's Date: 02/24/2021  History of Present Illness  Pt is a 56 year old female admitted with LLE pain and swelling started from the L knee progressed to LLE x3 to 4 days. Admitted on IV antibiotics. PMH include HTN, HLD, CVA, COPD, NIDDM T2, depression/anxiety, history of recurrent cellulitis   Clinical Impression  Pt received supine in bed, agreeable to therapy. Pt lives in an ALF and is able to ambulate w/ Rollator and perform ADLs with MOD I. Upon sitting up, therapist noted BM - pt unaware. Using RW, pt stood and remained standing as PT steadied and OT performed pericare. Pt encouraged to weight-shift to the RLE due to pain in the left. Pt unable to attain full hip extension/ MAX VC encouragement provide for pt participation to continue standing. Pt took a seated rest break before transferring to recliner. Pt required MOD A +2 for STS and stand-pivot transfer. Pt demo deficits in strength, balance, functional mobility such as standing and ambulation. Pt is not at her baseline and would benefit from rehab prior to returning to her ALF.       Recommendations for follow up therapy are one component of a multi-disciplinary discharge planning process, led by the attending physician.  Recommendations may be updated based on patient status, additional functional criteria and insurance authorization.  Follow Up Recommendations Skilled nursing-short term rehab (<3 hours/day)    Assistance Recommended at Discharge Frequent or constant Supervision/Assistance  Patient can return home with the following  Two people to help with walking and/or transfers;Two people to help with bathing/dressing/bathroom    Equipment Recommendations Other (comment) (TBD at next venue of care)  Recommendations for Other Services       Functional Status Assessment Patient has had a recent decline in their functional  status and demonstrates the ability to make significant improvements in function in a reasonable and predictable amount of time.     Precautions / Restrictions Precautions Precautions: Fall Restrictions Weight Bearing Restrictions: No Other Position/Activity Restrictions: No WB restrictions however LLE cellulitis, pt limited WB due to pain.      Mobility  Bed Mobility Overal bed mobility: Needs Assistance Bed Mobility: Supine to Sit     Supine to sit: Mod assist     General bed mobility comments: for LLE management and some assist at trunk    Transfers Overall transfer level: Needs assistance Equipment used: Rolling walker (2 wheels) Transfers: Sit to/from Stand;Bed to chair/wheelchair/BSC Sit to Stand: Mod assist;+2 physical assistance Stand pivot transfers: +2 physical assistance;Mod assist         General transfer comment: MOD A +2 for lift and to maintain standing position; +2 to manage RW and provide steadying/lifting assist throughout pivot    Ambulation/Gait               General Gait Details: deferred  Stairs            Wheelchair Mobility    Modified Rankin (Stroke Patients Only)       Balance Overall balance assessment: Needs assistance Sitting-balance support: Feet supported Sitting balance-Leahy Scale: Good     Standing balance support: Bilateral upper extremity supported;During functional activity;Reliant on assistive device for balance Standing balance-Leahy Scale: Poor Standing balance comment: RW and assist of 2 people to steady during transition to standing, to remain standing and during pivot transfer  Pertinent Vitals/Pain Pain Assessment: Faces Faces Pain Scale: Hurts even more Pain Location: LLE during mobility/WB Pain Descriptors / Indicators: Discomfort;Moaning Pain Intervention(s): Monitored during session;Limited activity within patient's tolerance;Repositioned    Home Living  Family/patient expects to be discharged to:: Assisted living                   Additional Comments: pt reports she lives at [facility]    Prior Function Prior Level of Function : Needs assist       Physical Assist : ADLs (physical)   ADLs (physical): IADLs Mobility Comments: pt reports she uses a rollator, is able to ambulate/transfer MOD I at baseline ADLs Comments: Pt reports she is MOD I with ADL, assist with IADL; information will need to be clarified     Hand Dominance        Extremity/Trunk Assessment   Upper Extremity Assessment Upper Extremity Assessment: Overall WFL for tasks assessed    Lower Extremity Assessment Lower Extremity Assessment: Generalized weakness       Communication      Cognition Arousal/Alertness: Awake/alert Behavior During Therapy: Flat affect Overall Cognitive Status: No family/caregiver present to determine baseline cognitive functioning Area of Impairment: Orientation;Following commands                 Orientation Level: Person;Place;Time;Situation     Following Commands: Follows one step commands with increased time;Follows multi-step commands inconsistently       General Comments: pt is alert and oriented x4, however fair biographical informant and increased time required for direction following; fair insight        General Comments General comments (skin integrity, edema, etc.): LLE wound wrapped in bandage; IV noted to be leaking prior to session; RN removed IV.    Exercises     Assessment/Plan    PT Assessment Patient needs continued PT services  PT Problem List Decreased strength;Decreased mobility;Decreased activity tolerance;Decreased balance;Decreased safety awareness       PT Treatment Interventions DME instruction;Therapeutic activities;Gait training;Therapeutic exercise;Balance training;Patient/family education;Functional mobility training    PT Goals (Current goals can be found in the Care Plan  section)  Acute Rehab PT Goals PT Goal Formulation: Patient unable to participate in goal setting    Frequency Min 2X/week     Co-evaluation PT/OT/SLP Co-Evaluation/Treatment: Yes Reason for Co-Treatment: Necessary to address cognition/behavior during functional activity;For patient/therapist safety;To address functional/ADL transfers PT goals addressed during session: Mobility/safety with mobility;Strengthening/ROM;Balance OT goals addressed during session: ADL's and self-care       AM-PAC PT "6 Clicks" Mobility  Outcome Measure Help needed turning from your back to your side while in a flat bed without using bedrails?: A Little Help needed moving from lying on your back to sitting on the side of a flat bed without using bedrails?: A Little Help needed moving to and from a bed to a chair (including a wheelchair)?: A Lot Help needed standing up from a chair using your arms (e.g., wheelchair or bedside chair)?: A Lot Help needed to walk in hospital room?: Total Help needed climbing 3-5 steps with a railing? : Total 6 Click Score: 12    End of Session Equipment Utilized During Treatment: Gait belt Activity Tolerance: Patient limited by pain Patient left: in chair;with call bell/phone within reach;with chair alarm set Nurse Communication: Mobility status;Precautions PT Visit Diagnosis: Unsteadiness on feet (R26.81);Muscle weakness (generalized) (M62.81);Difficulty in walking, not elsewhere classified (R26.2);Other abnormalities of gait and mobility (R26.89)    Time: [id number] PT Time Calculation (  min) (ACUTE ONLY): 32 min   Charges:   PT Evaluation $PT Eval Moderate Complexity: 1 Mod PT Treatments $Therapeutic Activity: 8-22 mins        Patrina Levering PT, DPT 02/24/21 4:41 PM [phone]

## 2021-02-24 NOTE — Plan of Care (Signed)

## 2021-02-24 NOTE — Progress Notes (Signed)
PROGRESS NOTE    [name] [name]  [id number] DOB: 08/11/1965 DOA: 02/22/2021 PCP: [name]   Chief Complaint  Patient presents with   Edema  Brief Narrative/Hospital Course: [name] [name], 56 y.o. female with PMH of  HTN, HLD, CVA, COPD, NIDDM T2, depression/anxiety, history of recurrent cellulitis of left lower extremity P/W  Lt LE pain and swelling, started from the left knee progressed to the lower extremity x 3 to 4 days. In ED-t-max 100.4, hypertensive, and tachycardiC, Creat 1.67, baseline creatinine 1.24--1.45, leukocytosis COVID-19 negative duplex negative for DVT Admitted on IV antibiotics  Subjective: Seen and examined this morning.  Nursing at the bedside Leg dressing changed earlier in the shift-no changes. Leukocytosis improving, hb also down Afebrile overnight  Assessment & Plan:  LLE cellulitis with blisters History of recurrent LLE cellulitis: Leukocytosis improving continue vancomycin ceftriaxone.Blood cultures 1/3-no growth so far Continue supportive care PT OT evaluation  Essential hypertension: Fairly -controlled on amlodipine and metoprolol  HLD History of CVA: Continue home Plavix and statin.  Gerd ON PPI  Anemia multifactorial iron deficiency and chronic disease: started  on iron supplement.  Will need follow-up with PCP for routine cancer screening/colonoscopy if not done previously-her hemoglobin has been ranging from 7-10 gm past 2-3 yrs.  Transfuse if less than 7 g Recent Labs  Lab 02/22/21 0111 02/22/21 0840 02/22/21 1855 02/23/21 0408 02/24/21 0411  HGB 7.2* 7.8* 8.3* 7.4* 7.2*  HCT 22.4* 24.6* 25.4* 22.8* 22.2*    Depression with anxiety: Mood stable continue Lexapro  CKD IIIb:Baseline creatinine 1.4-1.9 in 2022: Stable renal function,monitor while on IV antibiotics.  Recent Labs  Lab 02/22/21 0516 02/22/21 1855 02/23/21 0408 02/24/21 0411  BUN 37*  --  34* 38*  CREATININE 1.67* 1.56* 1.67* 1.56*    S/P  TKR Rt  Class I Obesity:Patient's Body mass index is 33.45 kg/m. : Will benefit with PCP follow-up, weight loss  healthy lifestyle and outpatient sleep evaluation.  DVT prophylaxis:   Lovenox Code Status:   Code Status: Full Code Family Communication: plan of care discussed with patient at bedside. Status is: Inpatient Remains inpatient appropriate because: For IV antibiotics Disposition: Currently no medically stable for discharge. Anticipated Disposition: TBD  Objective: Vitals last 24 hrs: Vitals:   02/23/21 2035 02/24/21 0044 02/24/21 0423 02/24/21 0730  BP: (!) 155/79 (!) 156/78 (!) 160/77 (!) 157/79  Pulse: 87 95 92 96  Resp: 18 20 20 15   Temp: 99.4 F (37.4 C) 99.9 F (37.7 C) 99.7 F (37.6 C) 99.7 F (37.6 C)  TempSrc:      SpO2: 98% 100% 100% 100%  Weight:      Height:       Weight change:   Intake/Output Summary (Last 24 hours) at 02/24/2021 1057 Last data filed at 02/24/2021 1026 Gross per 24 hour  Intake 1026 ml  Output 401 ml  Net 625 ml   Net IO Since Admission: 1,092.21 mL [02/24/21 1057]   Physical Examination: General exam: AAOx 3, older than stated age, weak appearing. HEENT:Oral mucosa moist, Ear/Nose WNL grossly, dentition normal. Respiratory system: bilaterally clear, no use of accessory muscle Cardiovascular system: S1 & S2 +, No JVD,. Gastrointestinal system: Abdomen soft, NT,ND, BS+ Nervous System:Alert, awake, moving extremities and grossly nonfocal Extremities: LLE edema w/ blisters on LLE- dressind in place Skin: No rashes,no icterus. MSK: Normal muscle bulk,tone, power    Medications reviewed:  Scheduled Meds:  amLODipine  10 mg Oral Daily   vitamin C  500 mg Oral Daily   aspirin EC  81 mg Oral Daily   atorvastatin  40 mg Oral QHS   cholecalciferol  1,000 Units Oral q morning   clopidogrel  75 mg Oral Q breakfast   enoxaparin (LOVENOX) injection  0.5 mg/kg Subcutaneous Q24H   escitalopram  10 mg Oral Daily   fluticasone  furoate-vilanterol  1 puff Inhalation Daily   furosemide  20 mg Intravenous BID   insulin aspart  0-15 Units Subcutaneous TID WC   insulin glargine-yfgn  12 Units Subcutaneous QHS   iron polysaccharides  150 mg Oral Daily   megestrol  40 mg Oral Daily   metoprolol tartrate  25 mg Oral Daily   multivitamin with minerals  1 tablet Oral Daily   nutrition supplement (JUVEN)  1 packet Oral BID BM   pantoprazole  40 mg Oral Daily   Ensure Max Protein  11 oz Oral QHS   sodium chloride flush  3 mL Intravenous Q12H   sodium chloride flush  3 mL Intravenous Q12H   Continuous Infusions:  sodium chloride     cefTRIAXone (ROCEPHIN)  IV 2 g (02/24/21 0757)   vancomycin 1,000 mg (02/24/21 1004)   Diet Order             Diet heart healthy/carb modified Room service appropriate? Yes; Fluid consistency: Thin  Diet effective now                   Weight change:   Wt Readings from Last 3 Encounters:  02/22/21 99.8 kg  01/27/21 98 kg  01/23/21 98 kg   Consultants:see note  Procedures:see note Antimicrobials: Anti-infectives (From admission, onward)    Start     Dose/Rate Route Frequency Ordered Stop   02/23/21 1000  vancomycin (VANCOCIN) IVPB 1000 mg/200 mL premix        1,000 mg 200 mL/hr over 60 Minutes Intravenous Every 24 hours 02/22/21 2142     02/23/21 0800  cefTRIAXone (ROCEPHIN) 2 g in sodium chloride 0.9 % 100 mL IVPB        2 g 200 mL/hr over 30 Minutes Intravenous Every 24 hours 02/22/21 2138     02/22/21 0830  vancomycin (VANCOREADY) IVPB 2000 mg/400 mL        2,000 mg 200 mL/hr over 120 Minutes Intravenous STAT 02/22/21 0819 02/22/21 1129   02/22/21 0800  cefTRIAXone (ROCEPHIN) 2 g in sodium chloride 0.9 % 100 mL IVPB        2 g 200 mL/hr over 30 Minutes Intravenous  Once 02/22/21 0759 02/22/21 0927      Culture/Microbiology    Component Value Date/Time   SDES BLOOD BLOOD RIGHT WRIST 02/22/2021 0516   SPECREQUEST  02/22/2021 0516    BOTTLES DRAWN AEROBIC AND  ANAEROBIC Blood Culture adequate volume   CULT  02/22/2021 0516    NO GROWTH 2 DAYS Performed at [facility], [address] [place], [place] [address]    REPTSTATUS PENDING 02/22/2021 [id number]    Other culture-see note  Unresulted Labs (From admission, onward)     Start     Ordered   03/01/21 0500  Creatinine, serum  (enoxaparin (LOVENOX)    CrCl >/= 30 ml/min)  Weekly,   STAT     Comments: while on enoxaparin therapy    02/22/21 1754   02/23/21 [id number]  Basic metabolic panel  Daily,   STAT      02/22/21 0909   02/23/21 0500  CBC  Daily,   STAT      02/22/21 0909          Data Reviewed: I have personally reviewed following labs and imaging studies CBC: Recent Labs  Lab 02/22/21 0111 02/22/21 0840 02/22/21 1855 02/23/21 0408 02/24/21 0411  WBC 15.2*  --  19.9* 15.7* 13.7*  NEUTROABS 12.2*  --   --   --   --   HGB 7.2* 7.8* 8.3* 7.4* 7.2*  HCT 22.4* 24.6* 25.4* 22.8* 22.2*  MCV 94.9  --  93.0 93.1 91.4  PLT 305  --  357 331 [id number]   Basic Metabolic Panel: Recent Labs  Lab 02/22/21 0516 02/22/21 1855 02/23/21 0408 02/24/21 0411  NA 137  --  136 138  K 3.8  --  3.7 3.9  CL 111  --  111 111  CO2 19*  --  20* 21*  GLUCOSE 133*  --  103* 104*  BUN 37*  --  34* 38*  CREATININE 1.67* 1.56* 1.67* 1.56*  CALCIUM 7.8*  --  7.8* 7.8*  MG  --   --  2.1  --   PHOS  --   --  3.6  --    GFR: Estimated Creatinine Clearance: 50.4 mL/min (A) (by C-G formula based on SCr of 1.56 mg/dL (H)). Liver Function Tests: Recent Labs  Lab 02/22/21 0516  AST 58*  ALT 44  ALKPHOS 82  BILITOT 0.6  PROT 6.7  ALBUMIN 2.2*   No results for input(s): LIPASE, AMYLASE in the last 168 hours. No results for input(s): AMMONIA in the last 168 hours. Coagulation Profile: No results for input(s): INR, PROTIME in the last 168 hours. Cardiac Enzymes: No results for input(s): CKTOTAL, CKMB, CKMBINDEX, TROPONINI in the last 168 hours. BNP (last 3 results) No results for input(s):  PROBNP in the last 8760 hours. HbA1C: Recent Labs    02/22/21 1855  HGBA1C 6.3*   CBG: Recent Labs  Lab 02/23/21 0738 02/23/21 1157 02/23/21 1637 02/23/21 2044 02/24/21 0731  GLUCAP 93 162* 169* 231* 102*   Lipid Profile: No results for input(s): CHOL, HDL, LDLCALC, TRIG, CHOLHDL, LDLDIRECT in the last 72 hours. Thyroid Function Tests: No results for input(s): TSH, T4TOTAL, FREET4, T3FREE, THYROIDAB in the last 72 hours. Anemia Panel: Recent Labs    02/22/21 0516 02/22/21 0906  VITAMINB12  --  557  FOLATE 9.7  --   TIBC 183*  --   IRON 12*  --    Sepsis Labs: Recent Labs  Lab 02/22/21 0112 02/22/21 0840 02/22/21 1855  LATICACIDVEN 0.8 0.7 1.0    Recent Results (from the past 240 hour(s))  Blood culture (routine x 2)     Status: None (Preliminary result)   Collection Time: 02/22/21  1:33 AM   Specimen: BLOOD  Result Value Ref Range Status   Specimen Description BLOOD LEFT FOREARM  Final   Special Requests   Final    BOTTLES DRAWN AEROBIC AND ANAEROBIC Blood Culture results may not be optimal due to an inadequate volume of blood received in culture bottles   Culture   Final    NO GROWTH 2 DAYS Performed at [facility], [address] [place], [place] [address]    Report Status PENDING  Incomplete  Blood culture (routine x 2)     Status: None (Preliminary result)   Collection Time: 02/22/21  5:16 AM   Specimen: BLOOD  Result Value Ref Range Status   Specimen Description BLOOD BLOOD RIGHT WRIST  Final   Special Requests   Final    BOTTLES DRAWN AEROBIC AND ANAEROBIC Blood Culture adequate volume   Culture   Final    NO GROWTH 2 DAYS Performed at [facility], [address] [place], [place] [address]    Report Status PENDING  Incomplete  Resp Panel by RT-PCR (Flu A&B, Covid) Nasopharyngeal Swab     Status: None   Collection Time: 02/22/21  8:40 AM   Specimen: Nasopharyngeal Swab; Nasopharyngeal(NP) swabs in vial transport medium   Result Value Ref Range Status   SARS Coronavirus 2 by RT PCR NEGATIVE NEGATIVE Final    Comment: (NOTE) SARS-CoV-2 target nucleic acids are NOT DETECTED.  The SARS-CoV-2 RNA is generally detectable in upper respiratory specimens during the acute phase of infection. The lowest concentration of SARS-CoV-2 viral copies this assay can detect is 138 copies/mL. A negative result does not preclude SARS-Cov-2 infection and should not be used as the sole basis for treatment or other patient management decisions. A negative result may occur with  improper specimen collection/handling, submission of specimen other than nasopharyngeal swab, presence of viral mutation(s) within the areas targeted by this assay, and inadequate number of viral copies(<138 copies/mL). A negative result must be combined with clinical observations, patient history, and epidemiological information. The expected result is Negative.  Fact Sheet for Patients:  EntrepreneurPulse.com.au  Fact Sheet for Healthcare Providers:  IncredibleEmployment.be  This test is no t yet approved or cleared by the [place] FDA and  has been authorized for detection and/or diagnosis of SARS-CoV-2 by FDA under an Emergency Use Authorization (EUA). This EUA will remain  in effect (meaning this test can be used) for the duration of the COVID-19 declaration under Section 564(b)(1) of the Act, 21 U.S.C.section 360bbb-3(b)(1), unless the authorization is terminated  or revoked sooner.       Influenza A by PCR NEGATIVE NEGATIVE Final   Influenza B by PCR NEGATIVE NEGATIVE Final    Comment: (NOTE) The Xpert Xpress SARS-CoV-2/FLU/RSV plus assay is intended as an aid in the diagnosis of influenza from Nasopharyngeal swab specimens and should not be used as a sole basis for treatment. Nasal washings and aspirates are unacceptable for Xpert Xpress SARS-CoV-2/FLU/RSV testing.  Fact Sheet for  Patients: EntrepreneurPulse.com.au  Fact Sheet for Healthcare Providers: IncredibleEmployment.be  This test is not yet approved or cleared by the [place] FDA and has been authorized for detection and/or diagnosis of SARS-CoV-2 by FDA under an Emergency Use Authorization (EUA). This EUA will remain in effect (meaning this test can be used) for the duration of the COVID-19 declaration under Section 564(b)(1) of the Act, 21 U.S.C. section 360bbb-3(b)(1), unless the authorization is terminated or revoked.  Performed at [facility], [address] [place], [place] [address]      Radiology Studies: No results found.   LOS: 2 days   Antonieta Pert, MD [facility]  02/24/2021, 10:57 AM

## 2021-02-25 DIAGNOSIS — L03116 Cellulitis of left lower limb: Secondary | ICD-10-CM | POA: Diagnosis not present

## 2021-02-25 LAB — CBC
HCT: 22.2 % — ABNORMAL LOW (ref 36.0–46.0)
Hemoglobin: 7.3 g/dL — ABNORMAL LOW (ref 12.0–15.0)
MCH: 30.3 pg (ref 26.0–34.0)
MCHC: 32.9 g/dL (ref 30.0–36.0)
MCV: 92.1 fL (ref 80.0–100.0)
Platelets: 399 10*3/uL (ref 150–400)
RBC: 2.41 MIL/uL — ABNORMAL LOW (ref 3.87–5.11)
RDW: 13.4 % (ref 11.5–15.5)
WBC: 12.5 10*3/uL — ABNORMAL HIGH (ref 4.0–10.5)
nRBC: 0 % (ref 0.0–0.2)

## 2021-02-25 LAB — GLUCOSE, CAPILLARY
Glucose-Capillary: 110 mg/dL — ABNORMAL HIGH (ref 70–99)
Glucose-Capillary: 105 mg/dL — ABNORMAL HIGH (ref 70–99)
Glucose-Capillary: 149 mg/dL — ABNORMAL HIGH (ref 70–99)
Glucose-Capillary: 156 mg/dL — ABNORMAL HIGH (ref 70–99)
Glucose-Capillary: 184 mg/dL — ABNORMAL HIGH (ref 70–99)
Glucose-Capillary: 179 mg/dL — ABNORMAL HIGH (ref 70–99)

## 2021-02-25 LAB — BASIC METABOLIC PANEL
Anion gap: 8 (ref 5–15)
BUN: 39 mg/dL — ABNORMAL HIGH (ref 6–20)
CO2: 20 mmol/L — ABNORMAL LOW (ref 22–32)
Calcium: 7.9 mg/dL — ABNORMAL LOW (ref 8.9–10.3)
Chloride: 108 mmol/L (ref 98–111)
Creatinine, Ser: 1.54 mg/dL — ABNORMAL HIGH (ref 0.44–1.00)
GFR, Estimated: 40 mL/min — ABNORMAL LOW (ref >60)
Glucose, Bld: 112 mg/dL — ABNORMAL HIGH (ref 70–99)
Potassium: 3.8 mmol/L (ref 3.5–5.1)
Sodium: 136 mmol/L (ref 135–145)

## 2021-02-25 LAB — OCCULT BLOOD X 1 CARD TO LAB, STOOL: Fecal Occult Bld: NEGATIVE

## 2021-02-25 LAB — PREPARE RBC (CROSSMATCH)

## 2021-02-25 MED ORDER — SODIUM CHLORIDE 0.9% IV SOLUTION: Freq: Once | INTRAVENOUS | Status: AC

## 2021-02-25 NOTE — Progress Notes (Signed)
PROGRESS NOTE    [name] [name]  [id number] DOB: 08-Apr-1965 DOA: 02/22/2021 PCP: [facility]   Chief Complaint  Patient presents with   Edema  Brief Narrative/Hospital Course: [name] [name], 56 y.o. female with PMH of  HTN, HLD, CVA, COPD, NIDDM T2, depression/anxiety, history of recurrent cellulitis of left lower extremity P/W  Lt LE pain and swelling, started from the left knee progressed to the lower extremity x 3 to 4 days. In ED-t-max 100.4, hypertensive, and tachycardiC, Creat 1.67, baseline creatinine 1.24--1.45, leukocytosis COVID-19 negative duplex negative for DVT Admitted on IV antibiotics for management of cellulitis  Subjective: Seen examined this morning.  SHE IS on the bedside chair Overnight no fever, WBC count improving. Blood pressure stable No new complaints.  Assessment & Plan:  LLE cellulitis with blisters History of recurrent LLE cellulitis: Leukocytosis improving , overall clinically improving, continue with vancomycin ceftriaxone.Blood cultures 1/3-no growth so far. Wound consult. Continue PT OT, supportive care wound care  Essential hypertension: BP controlled, continue on amlodipine and metoprolol  HLD History of CVA: Continue home Plavix and statin.  GERD: Cont PPI  Anemia multifactorial iron deficiency and chronic disease: Anemia panel w/ iron 12 low saturation 7, b12  55, Folate nl. started  on iron supplement.  Will need follow-up with PCP for routine cancer screening/colonoscopy if not done previously-her hemoglobin has been ranging from 7-10 gm past 2-3 yrs-once as low as 6.9.  She is symptomatic with weakness hemoglobin at 7.3 g this morning discussed risks benefits alternatives with transfusion and agrees for 1 unit blood transfusion, Hemoccult was negative. Recent Labs  Lab 02/22/21 0840 02/22/21 1855 02/23/21 0408 02/24/21 0411 02/25/21 0651  HGB 7.8* 8.3* 7.4* 7.2* 7.3*  HCT 24.6* 25.4* 22.8* 22.2* 22.2*     Depression with anxiety: Stable on Lexapro  CKD IIIb:Baseline creatinine 1.4-1.9 in 2022: Stable renal function, monitor.  Recent Labs  Lab 02/22/21 0516 02/22/21 1855 02/23/21 0408 02/24/21 0411 02/25/21 0651  BUN 37*  --  34* 38* 39*  CREATININE 1.67* 1.56* 1.67* 1.56* 1.54*    S/P TKR Rt  Class I Obesity:Patient's Body mass index is 33.45 kg/m. : Will benefit with PCP follow-up, weight loss  healthy lifestyle and outpatient sleep evaluation.  Deconditioning/debility: Seen by PT OT Will benefit with a skilled nursing facility  DVT prophylaxis:   Lovenox sq Code Status:   Code Status: Full Code Family Communication: plan of care discussed with patient at bedside. Status is: Inpatient Remains inpatient appropriate because: For IV antibiotics Disposition: Currently no medically stable for discharge. Anticipated Disposition: SNF over the weekend.  Objective: Vitals last 24 hrs: Vitals:   02/24/21 2025 02/24/21 2314 02/25/21 0507 02/25/21 0730  BP: (!) 150/78 (!) 162/81 (!) 157/82 (!) 158/80  Pulse: 89 96 (!) 105 99  Resp: (!) 21 20 20 18   Temp: 98.7 F (37.1 C) 97.8 F (36.6 C) 98.9 F (37.2 C) 99.1 F (37.3 C)  TempSrc:      SpO2: 99% 100% 100% 98%  Weight:      Height:       Weight change:   Intake/Output Summary (Last 24 hours) at 02/25/2021 1106 Last data filed at 02/25/2021 0945 Gross per 24 hour  Intake 360 ml  Output 1700 ml  Net -1340 ml   Net IO Since Admission: -247.79 mL [02/25/21 1106]   Physical Examination: General exam: AAOx 3, older than stated age, weak appearing. HEENT:Oral mucosa moist, Ear/Nose WNL grossly, dentition normal. Respiratory  system: bilaterally clear, no use of accessory muscle Cardiovascular system: S1 & S2 +, No JVD,. Gastrointestinal system: Abdomen soft,NT,ND, BS+ Nervous System:Alert, awake, moving extremities and grossly nonfocal Extremities: Left lower extremity with blisters tenderness and mild edema, distal peripheral  pulses palpable.  Skin: No rashes,no icterus. MSK: Normal muscle bulk,tone, power       Medications reviewed:  Scheduled Meds:  amLODipine  10 mg Oral Daily   vitamin C  500 mg Oral Daily   aspirin EC  81 mg Oral Daily   atorvastatin  40 mg Oral QHS   cholecalciferol  1,000 Units Oral q morning   clopidogrel  75 mg Oral Q breakfast   enoxaparin (LOVENOX) injection  0.5 mg/kg Subcutaneous Q24H   escitalopram  10 mg Oral Daily   fluticasone furoate-vilanterol  1 puff Inhalation Daily   furosemide  20 mg Intravenous BID   insulin aspart  0-15 Units Subcutaneous TID WC   insulin glargine-yfgn  12 Units Subcutaneous QHS   iron polysaccharides  150 mg Oral Daily   megestrol  40 mg Oral Daily   metoprolol tartrate  25 mg Oral Daily   multivitamin with minerals  1 tablet Oral Daily   nutrition supplement (JUVEN)  1 packet Oral BID BM   pantoprazole  40 mg Oral Daily   Ensure Max Protein  11 oz Oral QHS   sodium chloride flush  3 mL Intravenous Q12H   sodium chloride flush  3 mL Intravenous Q12H   Continuous Infusions:  sodium chloride     cefTRIAXone (ROCEPHIN)  IV 2 g (02/25/21 1101)   vancomycin 1,000 mg (02/24/21 1004)   Diet Order             Diet heart healthy/carb modified Room service appropriate? Yes; Fluid consistency: Thin  Diet effective now                   Weight change:   Wt Readings from Last 3 Encounters:  02/22/21 99.8 kg  01/27/21 98 kg  01/23/21 98 kg   Consultants:see note  Procedures:see note Antimicrobials: Anti-infectives (From admission, onward)    Start     Dose/Rate Route Frequency Ordered Stop   02/23/21 1000  vancomycin (VANCOCIN) IVPB 1000 mg/200 mL premix        1,000 mg 200 mL/hr over 60 Minutes Intravenous Every 24 hours 02/22/21 2142     02/23/21 0800  cefTRIAXone (ROCEPHIN) 2 g in sodium chloride 0.9 % 100 mL IVPB        2 g 200 mL/hr over 30 Minutes Intravenous Every 24 hours 02/22/21 2138     02/22/21 0830  vancomycin  (VANCOREADY) IVPB 2000 mg/400 mL        2,000 mg 200 mL/hr over 120 Minutes Intravenous STAT 02/22/21 0819 02/22/21 1129   02/22/21 0800  cefTRIAXone (ROCEPHIN) 2 g in sodium chloride 0.9 % 100 mL IVPB        2 g 200 mL/hr over 30 Minutes Intravenous  Once 02/22/21 0759 02/22/21 0927      Culture/Microbiology    Component Value Date/Time   SDES BLOOD BLOOD RIGHT WRIST 02/22/2021 0516   SPECREQUEST  02/22/2021 0516    BOTTLES DRAWN AEROBIC AND ANAEROBIC Blood Culture adequate volume   CULT  02/22/2021 0516    NO GROWTH 3 DAYS Performed at [facility], [address] [place], [place] [address]    REPTSTATUS PENDING 02/22/2021 [id number]    Other culture-see note  Unresulted Labs (  From admission, onward)     Start     Ordered   03/01/21 0500  Creatinine, serum  (enoxaparin (LOVENOX)    CrCl >/= 30 ml/min)  Weekly,   STAT     Comments: while on enoxaparin therapy    02/22/21 1754   02/23/21 [id number]  Basic metabolic panel  Daily,   STAT      02/22/21 0909   02/23/21 0500  CBC  Daily,   STAT      02/22/21 0909          Data Reviewed: I have personally reviewed following labs and imaging studies CBC: Recent Labs  Lab 02/22/21 0111 02/22/21 0840 02/22/21 1855 02/23/21 0408 02/24/21 0411 02/25/21 0651  WBC 15.2*  --  19.9* 15.7* 13.7* 12.5*  NEUTROABS 12.2*  --   --   --   --   --   HGB 7.2* 7.8* 8.3* 7.4* 7.2* 7.3*  HCT 22.4* 24.6* 25.4* 22.8* 22.2* 22.2*  MCV 94.9  --  93.0 93.1 91.4 92.1  PLT 305  --  357 331 343 [id number]   Basic Metabolic Panel: Recent Labs  Lab 02/22/21 0516 02/22/21 1855 02/23/21 0408 02/24/21 0411 02/25/21 0651  NA 137  --  136 138 136  K 3.8  --  3.7 3.9 3.8  CL 111  --  111 111 108  CO2 19*  --  20* 21* 20*  GLUCOSE 133*  --  103* 104* 112*  BUN 37*  --  34* 38* 39*  CREATININE 1.67* 1.56* 1.67* 1.56* 1.54*  CALCIUM 7.8*  --  7.8* 7.8* 7.9*  MG  --   --  2.1  --   --   PHOS  --   --  3.6  --   --    GFR: Estimated Creatinine  Clearance: 51 mL/min (A) (by C-G formula based on SCr of 1.54 mg/dL (H)). Liver Function Tests: Recent Labs  Lab 02/22/21 0516  AST 58*  ALT 44  ALKPHOS 82  BILITOT 0.6  PROT 6.7  ALBUMIN 2.2*   No results for input(s): LIPASE, AMYLASE in the last 168 hours. No results for input(s): AMMONIA in the last 168 hours. Coagulation Profile: No results for input(s): INR, PROTIME in the last 168 hours. Cardiac Enzymes: No results for input(s): CKTOTAL, CKMB, CKMBINDEX, TROPONINI in the last 168 hours. BNP (last 3 results) No results for input(s): PROBNP in the last 8760 hours. HbA1C: Recent Labs    02/22/21 1855  HGBA1C 6.3*   CBG: Recent Labs  Lab 02/24/21 1624 02/24/21 2023 02/24/21 2107 02/25/21 0545 02/25/21 0755  GLUCAP 103* 158* 163* 110* 105*   Lipid Profile: No results for input(s): CHOL, HDL, LDLCALC, TRIG, CHOLHDL, LDLDIRECT in the last 72 hours. Thyroid Function Tests: No results for input(s): TSH, T4TOTAL, FREET4, T3FREE, THYROIDAB in the last 72 hours. Anemia Panel: No results for input(s): VITAMINB12, FOLATE, FERRITIN, TIBC, IRON, RETICCTPCT in the last 72 hours. Sepsis Labs: Recent Labs  Lab 02/22/21 0112 02/22/21 0840 02/22/21 1855  LATICACIDVEN 0.8 0.7 1.0    Recent Results (from the past 240 hour(s))  Blood culture (routine x 2)     Status: None (Preliminary result)   Collection Time: 02/22/21  1:33 AM   Specimen: BLOOD  Result Value Ref Range Status   Specimen Description BLOOD LEFT FOREARM  Final   Special Requests   Final    BOTTLES DRAWN AEROBIC AND ANAEROBIC Blood Culture results may not be optimal due to an  inadequate volume of blood received in culture bottles   Culture   Final    NO GROWTH 3 DAYS Performed at [facility], [address] [place], [place] [address]    Report Status PENDING  Incomplete  Blood culture (routine x 2)     Status: None (Preliminary result)   Collection Time: 02/22/21  5:16 AM   Specimen: BLOOD   Result Value Ref Range Status   Specimen Description BLOOD BLOOD RIGHT WRIST  Final   Special Requests   Final    BOTTLES DRAWN AEROBIC AND ANAEROBIC Blood Culture adequate volume   Culture   Final    NO GROWTH 3 DAYS Performed at [facility] [address] [place], [place] [address]    Report Status PENDING  Incomplete  Resp Panel by RT-PCR (Flu A&B, Covid) Nasopharyngeal Swab     Status: None   Collection Time: 02/22/21  8:40 AM   Specimen: Nasopharyngeal Swab; Nasopharyngeal(NP) swabs in vial transport medium  Result Value Ref Range Status   SARS Coronavirus 2 by RT PCR NEGATIVE NEGATIVE Final    Comment: (NOTE) SARS-CoV-2 target nucleic acids are NOT DETECTED.  The SARS-CoV-2 RNA is generally detectable in upper respiratory specimens during the acute phase of infection. The lowest concentration of SARS-CoV-2 viral copies this assay can detect is 138 copies/mL. A negative result does not preclude SARS-Cov-2 infection and should not be used as the sole basis for treatment or other patient management decisions. A negative result may occur with  improper specimen collection/handling, submission of specimen other than nasopharyngeal swab, presence of viral mutation(s) within the areas targeted by this assay, and inadequate number of viral copies(<138 copies/mL). A negative result must be combined with clinical observations, patient history, and epidemiological information. The expected result is Negative.  Fact Sheet for Patients:  EntrepreneurPulse.com.au  Fact Sheet for Healthcare Providers:  IncredibleEmployment.be  This test is no t yet approved or cleared by the [place] FDA and  has been authorized for detection and/or diagnosis of SARS-CoV-2 by FDA under an Emergency Use Authorization (EUA). This EUA will remain  in effect (meaning this test can be used) for the duration of the COVID-19 declaration under Section  564(b)(1) of the Act, 21 U.S.C.section 360bbb-3(b)(1), unless the authorization is terminated  or revoked sooner.       Influenza A by PCR NEGATIVE NEGATIVE Final   Influenza B by PCR NEGATIVE NEGATIVE Final    Comment: (NOTE) The Xpert Xpress SARS-CoV-2/FLU/RSV plus assay is intended as an aid in the diagnosis of influenza from Nasopharyngeal swab specimens and should not be used as a sole basis for treatment. Nasal washings and aspirates are unacceptable for Xpert Xpress SARS-CoV-2/FLU/RSV testing.  Fact Sheet for Patients: EntrepreneurPulse.com.au  Fact Sheet for Healthcare Providers: IncredibleEmployment.be  This test is not yet approved or cleared by the [place] FDA and has been authorized for detection and/or diagnosis of SARS-CoV-2 by FDA under an Emergency Use Authorization (EUA). This EUA will remain in effect (meaning this test can be used) for the duration of the COVID-19 declaration under Section 564(b)(1) of the Act, 21 U.S.C. section 360bbb-3(b)(1), unless the authorization is terminated or revoked.  Performed at [facility], [address] [place], [place] [address]      Radiology Studies: No results found.   LOS: 3 days   Antonieta Pert, MD [facility]  02/25/2021, 11:06 AM

## 2021-02-25 NOTE — Progress Notes (Signed)
Pharmacy Antibiotic Note  [name] [name] is a 56 y.o. female admitted on 02/22/2021. Pharmacy has been consulted for cellulitis dosing.  Day 4 of antibiotics  Plan: Will continue with Vancomycin 1000 mg IV q24h.  Goal AUC 400-550 Expected AUC: 517.3 SCr used: 1.54  Also on ceftriaxone 2 g IV q24h   Height: 5\' 8"  (172.7 cm) Weight: 99.8 kg (220 lb) IBW/kg (Calculated) : 63.9  Temp (24hrs), Avg:98.7 F (37.1 C), Min:97.8 F (36.6 C), Max:99.1 F (37.3 C)  Recent Labs  Lab 02/22/21 0111 02/22/21 0112 02/22/21 0516 02/22/21 0840 02/22/21 1855 02/23/21 0408 02/24/21 0411 02/25/21 0651  WBC 15.2*  --   --   --  19.9* 15.7* 13.7* 12.5*  CREATININE  --   --  1.67*  --  1.56* 1.67* 1.56* 1.54*  LATICACIDVEN  --  0.8  --  0.7 1.0  --   --   --      Estimated Creatinine Clearance: 51 mL/min (A) (by C-G formula based on SCr of 1.54 mg/dL (H)).    Allergies  Allergen Reactions   Latex Rash    Severe itching.    Antimicrobials this admission: Ceftriaxone 1/3 >> Vancomycin 1/3 >>   Microbiology results: 1/3 BCx: NGTD   Thank you for allowing pharmacy to be a part of this patients care.  04/25/21, PharmD Clinical Pharmacist 02/25/2021 1:14 PM

## 2021-02-25 NOTE — Consult Note (Signed)
WOC Nurse Consult Note: Patient receiving care in [facility]. Reason for Consult: LLE wound I saw the patient for this same issue on 02/22/21. My note is in timed 12:06 on that date, as well as orders for the care of the leg/wounds. Please follow those. No new needs identified. WOC did not see today and will not follow. Helmut Muster, RN, MSN, CWOCN, CNS-BC, pager [phone]

## 2021-02-25 NOTE — TOC Progression Note (Addendum)
Transition of Care [facility]) - Progression Note    Patient Details  Name: BRUNETTE LAVALLE MRN: [id number] Date of Birth: May 11, 1965  Transition of [facility] CM/SW Contact  Margarito Liner, LCSW Phone Number: 02/25/2021, 12:21 PM  Clinical Narrative:   Left voicemail for patient's legal guardian. Will discuss SNF recommendation when she calls back. Patient voiced that she does not want to go to SNF prior to CSW seeing that she had a legal guardian.  Expected Discharge Plan: Assisted Living Barriers to Discharge: Continued Medical Work up  Expected Discharge Plan and Services Expected Discharge Plan: Assisted Living     Post Acute Care Choice: NA Living arrangements for the past 2 months: Assisted Living Facility                                       Social Determinants of Health (SDOH) Interventions    Readmission Risk Interventions Readmission Risk Prevention Plan 02/23/2021 01/02/2020  Transportation Screening Complete Complete  HRI or Home Care Consult - Complete  Social Work Consult for Recovery Care Planning/Counseling - Complete  Palliative Care Screening - Not Applicable  Medication Review Oceanographer) Complete Complete  PCP or Specialist appointment within 3-5 days of discharge Complete -  SW Recovery Care/Counseling Consult Complete -  Palliative Care Screening Not Applicable -  Skilled Nursing Facility Not Applicable -  Some recent data might be hidden

## 2021-02-26 DIAGNOSIS — F418 Other specified anxiety disorders: Secondary | ICD-10-CM

## 2021-02-26 DIAGNOSIS — N1831 Chronic kidney disease, stage 3a: Secondary | ICD-10-CM

## 2021-02-26 DIAGNOSIS — I10 Essential (primary) hypertension: Secondary | ICD-10-CM

## 2021-02-26 DIAGNOSIS — L03116 Cellulitis of left lower limb: Secondary | ICD-10-CM | POA: Diagnosis not present

## 2021-02-26 DIAGNOSIS — F419 Anxiety disorder, unspecified: Secondary | ICD-10-CM | POA: Diagnosis not present

## 2021-02-26 LAB — GLUCOSE, CAPILLARY
Glucose-Capillary: 109 mg/dL — ABNORMAL HIGH (ref 70–99)
Glucose-Capillary: 100 mg/dL — ABNORMAL HIGH (ref 70–99)
Glucose-Capillary: 188 mg/dL — ABNORMAL HIGH (ref 70–99)
Glucose-Capillary: 127 mg/dL — ABNORMAL HIGH (ref 70–99)
Glucose-Capillary: 148 mg/dL — ABNORMAL HIGH (ref 70–99)

## 2021-02-26 LAB — TYPE AND SCREEN
ABO/RH(D): A POS
Antibody Screen: NEGATIVE
Unit division: 0

## 2021-02-26 LAB — BPAM RBC
Blood Product Expiration Date: 202301202359
ISSUE DATE / TIME: 202301061733
Unit Type and Rh: 6200

## 2021-02-26 LAB — BASIC METABOLIC PANEL
Anion gap: 6 (ref 5–15)
BUN: 40 mg/dL — ABNORMAL HIGH (ref 6–20)
CO2: 19 mmol/L — ABNORMAL LOW (ref 22–32)
Calcium: 7.7 mg/dL — ABNORMAL LOW (ref 8.9–10.3)
Chloride: 109 mmol/L (ref 98–111)
Creatinine, Ser: 1.46 mg/dL — ABNORMAL HIGH (ref 0.44–1.00)
GFR, Estimated: 42 mL/min — ABNORMAL LOW (ref >60)
Glucose, Bld: 112 mg/dL — ABNORMAL HIGH (ref 70–99)
Potassium: 3.7 mmol/L (ref 3.5–5.1)
Sodium: 134 mmol/L — ABNORMAL LOW (ref 135–145)

## 2021-02-26 LAB — CBC
HCT: 24.6 % — ABNORMAL LOW (ref 36.0–46.0)
Hemoglobin: 8.3 g/dL — ABNORMAL LOW (ref 12.0–15.0)
MCH: 29.7 pg (ref 26.0–34.0)
MCHC: 33.7 g/dL (ref 30.0–36.0)
MCV: 88.2 fL (ref 80.0–100.0)
Platelets: 412 10*3/uL — ABNORMAL HIGH (ref 150–400)
RBC: 2.79 MIL/uL — ABNORMAL LOW (ref 3.87–5.11)
RDW: 15 % (ref 11.5–15.5)
WBC: 13.2 10*3/uL — ABNORMAL HIGH (ref 4.0–10.5)
nRBC: 0 % (ref 0.0–0.2)

## 2021-02-26 LAB — VANCOMYCIN, PEAK: Vancomycin Pk: 32 ug/mL (ref 30–40)

## 2021-02-26 MED ORDER — FUROSEMIDE 20 MG PO TABS
20.0000 mg | ORAL_TABLET | Freq: Every day | ORAL | Status: DC
Start: 2021-02-26 — End: 2021-02-26

## 2021-02-26 MED ORDER — GUAIFENESIN ER 600 MG PO TB12
600.0000 mg | ORAL_TABLET | Freq: Two times a day (BID) | ORAL | Status: DC | PRN
  Administered 2021-02-26 – 2021-02-27 (×2): 600 mg via ORAL
  Filled 2021-02-26 (×2): qty 1

## 2021-02-26 MED ORDER — FUROSEMIDE 20 MG PO TABS
20.0000 mg | ORAL_TABLET | Freq: Every day | ORAL | Status: DC
  Administered 2021-02-27 – 2021-03-03 (×5): 20 mg via ORAL
  Filled 2021-02-26 (×5): qty 1

## 2021-02-26 NOTE — Plan of Care (Signed)

## 2021-02-26 NOTE — Progress Notes (Addendum)
PROGRESS NOTE    [name] [name]  AST:[id number] DOB: [birth year] DOA: 02/22/2021 PCP: [facility]    Brief Narrative:  Mrs. Sides was admitted to the hospital with the working diagnosis of left lower extremity cellulitis.   56 yo female resident of a group home with the past medical history of HTN, dyslipidemia, CVA, COPD, T2DM, depression and anxiety who presented with left lower extremity rash, pain and edema. Reported worsening rash from the left knee down to her lower extremity for the last 3 to 4 days leading into her hospitalization, associated with decreased ambulation due to pain and edema. Positive fever and chills. On her initial physical examination her temp was 100,4, blood pressure 170/84, HR 92-103, RR 16-22 and oxygen saturation 99%. Lungs were clear to auscultation, heart with S1 and S2 present, abdomen soft, left lower extremity edema, with erythema and tenderness.   Sodium 137, potassium 3.8, chloride 111, bicarb 19, glucose 133, BUN 37, creatinine 1.67, white count 19.9, hemoglobin 8.3, hematocrit 25.4, platelets 357 SARS COVID-19 negative.  Chest radiograph with cardiomegaly, bilateral interstitial infiltrates at bases.  EKG 94 bpm, normal axis, normal intervals, sinus rhythm, no significant ST segment or T wave changes.  Patient was placed on IV antibiotic therapy with good toleration.  Worsening anemia, received 1 unit PRBC transfusion.   PT and OT recommended further therapy at SNF   Now pending transfer to SNF  Assessment & Plan:   Principal Problem:   Cellulitis of left lower extremity Active Problems:   Anxiety   S/P TKR (total knee replacement) using cement, right   Essential hypertension   Depression with anxiety   CKD (chronic kidney disease), stage III (HCC)   Left lower extremity cellulitis, complicated with sepsis present on admission  Left lower extremity with dressing in place, pain is controlled, continue to have edema.  Wbc  is 13,2, she has been afebrile and cultures continue with no growth   Plan to continue broad spectrum IV antibiotic therapy Continue local wound care As needed analgesics Follow up on cell count and temperature curve.  2. HTN. Diastolic heart failure (acute on chronic) Blood pressure has been elevated 165-170 systolic, continue blood pressure control with amlodipine.  Continue with statin therapy.  Change furosemide to po   3. CVA old. Patient is living in an group home. Continue pt and ot, blood pressure control.  Continue with clopidogrel.   4. CKD stage 3b, hyponatremia.  Renal function with serum cr at 1,46, K is 3,7 and serum bicarbonate at 19.  Na 134 Positive anion gap metabolic acidosis.   5. Anemia of chronic disease combined with iron deficiency.  Cell count stable with hgb at 8,3 and hct at 24,6  Thrombocytosis with plt 412.   6. Depression and anxiety Continue with as needed lorazepam.  On escitalopram.  7. Obesity class 1  Calculated BMI is 33,4  8. T2DM/ dyslipidemia. Continue glucose cover and monitoring with insulin sliding scale, continue basal insulin.  Continue with statin therapy  Patient continue to be at high risk for worsening cellulitis   Status is: Inpatient  Remains inpatient appropriate because: IV antibiotic therapy   DVT prophylaxis: Enoxaparin   Code Status:    full  Family Communication:   No family at the bedside      Nutrition Status: Nutrition Problem: Increased nutrient needs Etiology: wound healing Signs/Symptoms: estimated needs Interventions: MVI, Juven, Premier Protein     Antimicrobials:  Ceftriaxone     Subjective:  Patient with positive pain on left lower extremity, improved compared to admission but not back to baseline, no nausea or vomitimg   Objective: Vitals:   02/25/21 1937 02/25/21 2317 02/26/21 0801 02/26/21 1114  BP: (!) 149/75 (!) 143/74 (!) 165/80 (!) 170/83  Pulse: 85 87 93 99  Resp: 17 15 20 16    Temp: 98.5 F (36.9 C) 97.6 F (36.4 C) 98.9 F (37.2 C) 98.9 F (37.2 C)  TempSrc:      SpO2: 100% 97% 100% 100%  Weight:      Height:        Intake/Output Summary (Last 24 hours) at 02/26/2021 1325 Last data filed at 02/26/2021 1215 Gross per 24 hour  Intake 1046.05 ml  Output 1600 ml  Net -553.95 ml   Filed Weights   02/22/21 0133  Weight: 99.8 kg    Examination:   General: Not in pain or dyspnea, deconditioned  Neurology: Awake and alert, non focal  E ENT: mild pallor, no icterus, oral mucosa moist Cardiovascular: No JVD. S1-S2 present, rhythmic, no gallops, rubs, or murmurs. Left lower extremity edema. Pulmonary: positive breath sounds bilaterally, with no wheezing, rhonchi or rales. Gastrointestinal. Abdomen soft and non tender Skin. Left leg with dressing in place, positive edema.  Musculoskeletal: no joint deformities     Data Reviewed: I have personally reviewed following labs and imaging studies  CBC: Recent Labs  Lab 02/22/21 0111 02/22/21 0840 02/22/21 1855 02/23/21 0408 02/24/21 0411 02/25/21 0651 02/26/21 0144  WBC 15.2*  --  19.9* 15.7* 13.7* 12.5* 13.2*  NEUTROABS 12.2*  --   --   --   --   --   --   HGB 7.2*   < > 8.3* 7.4* 7.2* 7.3* 8.3*  HCT 22.4*   < > 25.4* 22.8* 22.2* 22.2* 24.6*  MCV 94.9  --  93.0 93.1 91.4 92.1 88.2  PLT 305  --  357 331 343 399 412*   < > = values in this interval not displayed.   Basic Metabolic Panel: Recent Labs  Lab 02/22/21 0516 02/22/21 1855 02/23/21 0408 02/24/21 0411 02/25/21 0651 02/26/21 0144  NA 137  --  136 138 136 134*  K 3.8  --  3.7 3.9 3.8 3.7  CL 111  --  111 111 108 109  CO2 19*  --  20* 21* 20* 19*  GLUCOSE 133*  --  103* 104* 112* 112*  BUN 37*  --  34* 38* 39* 40*  CREATININE 1.67* 1.56* 1.67* 1.56* 1.54* 1.46*  CALCIUM 7.8*  --  7.8* 7.8* 7.9* 7.7*  MG  --   --  2.1  --   --   --   PHOS  --   --  3.6  --   --   --    GFR: Estimated Creatinine Clearance: 53.8 mL/min (A) (by C-G  formula based on SCr of 1.46 mg/dL (H)). Liver Function Tests: Recent Labs  Lab 02/22/21 0516  AST 58*  ALT 44  ALKPHOS 82  BILITOT 0.6  PROT 6.7  ALBUMIN 2.2*   No results for input(s): LIPASE, AMYLASE in the last 168 hours. No results for input(s): AMMONIA in the last 168 hours. Coagulation Profile: No results for input(s): INR, PROTIME in the last 168 hours. Cardiac Enzymes: No results for input(s): CKTOTAL, CKMB, CKMBINDEX, TROPONINI in the last 168 hours. BNP (last 3 results) No results for input(s): PROBNP in the last 8760 hours. HbA1C: No results for input(s): HGBA1C in the  last 72 hours. CBG: Recent Labs  Lab 02/25/21 1855 02/25/21 2139 02/26/21 0803 02/26/21 1020 02/26/21 1154  GLUCAP 184* 179* 109* 100* 188*   Lipid Profile: No results for input(s): CHOL, HDL, LDLCALC, TRIG, CHOLHDL, LDLDIRECT in the last 72 hours. Thyroid Function Tests: No results for input(s): TSH, T4TOTAL, FREET4, T3FREE, THYROIDAB in the last 72 hours. Anemia Panel: No results for input(s): VITAMINB12, FOLATE, FERRITIN, TIBC, IRON, RETICCTPCT in the last 72 hours.    Radiology Studies: I have reviewed all of the imaging during this hospital visit personally     Scheduled Meds:  amLODipine  10 mg Oral Daily   vitamin C  500 mg Oral Daily   aspirin EC  81 mg Oral Daily   atorvastatin  40 mg Oral QHS   cholecalciferol  1,000 Units Oral q morning   clopidogrel  75 mg Oral Q breakfast   enoxaparin (LOVENOX) injection  0.5 mg/kg Subcutaneous Q24H   escitalopram  10 mg Oral Daily   fluticasone furoate-vilanterol  1 puff Inhalation Daily   furosemide  20 mg Intravenous BID   insulin aspart  0-15 Units Subcutaneous TID WC   insulin glargine-yfgn  12 Units Subcutaneous QHS   iron polysaccharides  150 mg Oral Daily   megestrol  40 mg Oral Daily   metoprolol tartrate  25 mg Oral Daily   multivitamin with minerals  1 tablet Oral Daily   nutrition supplement (JUVEN)  1 packet Oral BID  BM   pantoprazole  40 mg Oral Daily   Ensure Max Protein  11 oz Oral QHS   sodium chloride flush  3 mL Intravenous Q12H   sodium chloride flush  3 mL Intravenous Q12H   Continuous Infusions:  sodium chloride     cefTRIAXone (ROCEPHIN)  IV 2 g (02/26/21 1212)   vancomycin 1,000 mg (02/26/21 1047)     LOS: 4 days        Mats Jeanlouis Gerome Apley, MD

## 2021-02-26 NOTE — TOC Progression Note (Signed)
Transition of Care [facility]) - Progression Note    Patient Details  Name: [name] [name] MRN: [id number] Date of Birth: 1965/04/08  Transition [facility]) CM/SW Contact  Ashley Royalty Lutricia Feil, RN Phone Number:[phone] 02/26/2021, 12:18 PM  Clinical Narrative:    Spoke with legal guardian [name] [name] [phone]) concerning recommendations for SNF placement for ongoing rehab. Guardian approved SNF search for placement within the [facility] area. Will present bed-search for SNF placement.  Obtained PASRR #[id number] A 02/26/2021  and submitted bed search today and currently offers. Please follow up with legal guardian with all offers.  Expected Discharge Plan: Assisted Living Barriers to Discharge: Continued Medical Work up  Expected Discharge Plan and Services Expected Discharge Plan: Assisted Living     Post Acute Care Choice: NA Living arrangements for the past 2 months: Assisted Living Facility                                       Social Determinants of Health (SDOH) Interventions    Readmission Risk Interventions Readmission Risk Prevention Plan 02/23/2021 01/02/2020  Transportation Screening Complete Complete  HRI or Home Care Consult - Complete  Social Work Consult for Recovery Care Planning/Counseling - Complete  Palliative Care Screening - Not Applicable  Medication Review Oceanographer) Complete Complete  PCP or Specialist appointment within 3-5 days of discharge Complete -  SW Recovery Care/Counseling Consult Complete -  Palliative Care Screening Not Applicable -  Skilled Nursing Facility Not Applicable -  Some recent data might be hidden

## 2021-02-26 NOTE — NC FL2 (Signed)
[place] LEVEL OF CARE SCREENING TOOL     IDENTIFICATION  Patient Name: [name] [name] Birthdate: 1965-04-14 Sex: female Admission Date (Current Location): 02/22/2021  [place] and [place] Number:  Selena Lesser [id number] [place] and Address:  [facility], [address], [place], [place] [address]      Provider Number: [id number]  Attending Physician Name and Address:  Tawni Millers,*  Relative Name and Phone Number:  Doristine Church [phone]    Current Level of Care: Hospital Recommended Level of Care: [place] Prior Approval Number:    Date Approved/Denied: 02/26/21 PASRR Number: [id number]  Discharge Plan: SNF    Current Diagnoses: Patient Active Problem List   Diagnosis Date Noted   CKD (chronic kidney disease), stage III ([place]) 02/23/2021   Cellulitis of left lower extremity 0000000   Complicated grief [id number]   Acute lower UTI 01/04/2020   Pressure injury of skin 01/03/2020   Dysphagia 01/03/2020   Hypocalcemia 01/03/2020   Anemia in chronic kidney disease (CKD) 01/03/2020   Sepsis ([place]) 01/02/2020   Type 2 diabetes mellitus with hypoglycemia without coma ([place]) 01/02/2020   AKI (acute kidney injury) ([place]) 01/01/2020   Hyperlipidemia    Essential hypertension    Depression with anxiety    Chronic obstructive pulmonary disease ([place])    Tobacco abuse 10/21/2019   Sensory disturbance 10/21/2019   TIA (transient ischemic attack) 10/20/2019   Altered mental status    Loss of consciousness ([place]) 09/08/2018   Encephalopathy 09/08/2018   S/P TKR (total knee replacement) using cement, 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   Anxiety 04/02/2017   Urinary incontinence 01/25/2016   Fecal incontinence 01/25/2016   Dysthymia 01/25/2016    Orientation RESPIRATION BLADDER Height & Weight     Self, Time, Situation, Place  Normal Incontinent Weight: 99.8 kg Height:  5\' 8"  (172.7 cm)  BEHAVIORAL  SYMPTOMS/MOOD NEUROLOGICAL BOWEL NUTRITION STATUS      Incontinent Diet  AMBULATORY STATUS COMMUNICATION OF NEEDS Skin   Limited Assist Verbally Other (Comment) (Left leg cellulitis)                       Personal Care Assistance Level of Assistance  Bathing, Dressing Bathing Assistance: Limited assistance   Dressing Assistance: Limited assistance     Functional Limitations Info             SPECIAL CARE FACTORS FREQUENCY  PT (By licensed PT), OT (By licensed OT)     PT Frequency: 5 X weekly OT Frequency: 5 X weekly            Contractures Contractures Info: Not present    Additional Factors Info                  Current Medications (02/26/2021):  This is the current hospital active medication list Current Facility-Administered Medications  Medication Dose Route Frequency Provider Last Rate Last Admin   0.9 %  sodium chloride infusion  250 mL Intravenous PRN Val Riles, MD       acetaminophen (TYLENOL) tablet 650 mg  650 mg Oral Q6H PRN Val Riles, MD   650 mg at 02/25/21 1012   Or   acetaminophen (TYLENOL) suppository 650 mg  650 mg Rectal Q6H PRN Val Riles, MD       acetaminophen (TYLENOL) tablet 650 mg  650 mg Oral Q8H PRN Benita Gutter, RPH   650 mg at 02/24/21 0600   albuterol (PROVENTIL) (2.5 MG/3ML) 0.083% nebulizer solution  3 mL  3 mL Nebulization Q4H PRN Val Riles, MD       amLODipine (NORVASC) tablet 10 mg  10 mg Oral Daily Val Riles, MD   10 mg at 02/26/21 1029   ascorbic acid (VITAMIN C) tablet 500 mg  500 mg Oral Daily Val Riles, MD   500 mg at 02/26/21 1029   aspirin EC tablet 81 mg  81 mg Oral Daily Val Riles, MD   81 mg at 02/26/21 1037   atorvastatin (LIPITOR) tablet 40 mg  40 mg Oral QHS Val Riles, MD   40 mg at 02/25/21 2215   cefTRIAXone (ROCEPHIN) 2 g in sodium chloride 0.9 % 100 mL IVPB  2 g Intravenous Q24H Rise Patience, MD   Stopped at 02/25/21 1131   cholecalciferol (VITAMIN D3) tablet 1,000  Units  1,000 Units Oral q morning Val Riles, MD   1,000 Units at 02/26/21 1029   clopidogrel (PLAVIX) tablet 75 mg  75 mg Oral Q breakfast Val Riles, MD   75 mg at 02/26/21 1028   enoxaparin (LOVENOX) injection 50 mg  0.5 mg/kg Subcutaneous Q24H Val Riles, MD   50 mg at 02/25/21 2216   escitalopram (LEXAPRO) tablet 10 mg  10 mg Oral Daily Val Riles, MD   10 mg at 02/26/21 1029   fluticasone furoate-vilanterol (BREO ELLIPTA) 100-25 MCG/ACT 1 puff  1 puff Inhalation Daily Val Riles, MD   1 puff at 02/26/21 1030   furosemide (LASIX) injection 20 mg  20 mg Intravenous BID Val Riles, MD   20 mg at 02/26/21 1030   guaiFENesin (MUCINEX) 12 hr tablet 600 mg  600 mg Oral BID PRN Arrien, Jimmy Picket, MD       insulin aspart (novoLOG) injection 0-15 Units  0-15 Units Subcutaneous TID WC Val Riles, MD   3 Units at 02/25/21 1858   insulin glargine-yfgn (SEMGLEE) injection 12 Units  12 Units Subcutaneous QHS Val Riles, MD   12 Units at 02/25/21 2216   iron polysaccharides (NIFEREX) capsule 150 mg  150 mg Oral Daily Val Riles, MD   150 mg at 02/26/21 1029   LORazepam (ATIVAN) tablet 1 mg  1 mg Oral BID PRN Val Riles, MD   1 mg at 02/25/21 2215   megestrol (MEGACE) tablet 40 mg  40 mg Oral Daily Val Riles, MD   40 mg at 02/26/21 1028   metoprolol tartrate (LOPRESSOR) tablet 25 mg  25 mg Oral Daily Val Riles, MD   25 mg at 02/26/21 1029   multivitamin with minerals tablet 1 tablet  1 tablet Oral Daily Val Riles, MD   1 tablet at 02/26/21 1028   nutrition supplement (JUVEN) (JUVEN) powder packet 1 packet  1 packet Oral BID BM Kc, Ramesh, MD   1 packet at 02/26/21 1031   ondansetron (ZOFRAN) tablet 4 mg  4 mg Oral Q6H PRN Val Riles, MD       Or   ondansetron [facility]) injection 4 mg  4 mg Intravenous Q6H PRN Val Riles, MD       pantoprazole (PROTONIX) EC tablet 40 mg  40 mg Oral Daily Val Riles, MD   40 mg at 02/26/21 1028   polyethylene glycol  (MIRALAX / GLYCOLAX) packet 17 g  17 g Oral Daily PRN Val Riles, MD       protein supplement (ENSURE MAX) liquid  11 oz Oral QHS Antonieta Pert, MD   11 oz at 02/25/21 2216   sodium chloride flush (  NS) 0.9 % injection 3 mL  3 mL Intravenous Q12H Val Riles, MD   3 mL at 02/26/21 1031   sodium chloride flush (NS) 0.9 % injection 3 mL  3 mL Intravenous Q12H Val Riles, MD   3 mL at 02/25/21 1014   sodium chloride flush (NS) 0.9 % injection 3 mL  3 mL Intravenous PRN Val Riles, MD   3 mL at 02/23/21 0920   vancomycin (VANCOCIN) IVPB 1000 mg/200 mL premix  1,000 mg Intravenous Q24H Tawnya Crook, RPH 200 mL/hr at 02/26/21 1047 1,000 mg at 02/26/21 1047     Discharge Medications: Please see discharge summary for a list of discharge medications.  Relevant Imaging Results:  Relevant Lab Results:   Additional Information    Zigmund Daniel, Dorian Pod, RN

## 2021-02-27 DIAGNOSIS — F419 Anxiety disorder, unspecified: Secondary | ICD-10-CM | POA: Diagnosis not present

## 2021-02-27 DIAGNOSIS — N1831 Chronic kidney disease, stage 3a: Secondary | ICD-10-CM | POA: Diagnosis not present

## 2021-02-27 DIAGNOSIS — F418 Other specified anxiety disorders: Secondary | ICD-10-CM | POA: Diagnosis not present

## 2021-02-27 DIAGNOSIS — L03116 Cellulitis of left lower limb: Secondary | ICD-10-CM | POA: Diagnosis not present

## 2021-02-27 LAB — BASIC METABOLIC PANEL
Anion gap: 7 (ref 5–15)
BUN: 46 mg/dL — ABNORMAL HIGH (ref 6–20)
CO2: 20 mmol/L — ABNORMAL LOW (ref 22–32)
Calcium: 8.2 mg/dL — ABNORMAL LOW (ref 8.9–10.3)
Chloride: 108 mmol/L (ref 98–111)
Creatinine, Ser: 1.4 mg/dL — ABNORMAL HIGH (ref 0.44–1.00)
GFR, Estimated: 44 mL/min — ABNORMAL LOW (ref >60)
Glucose, Bld: 96 mg/dL (ref 70–99)
Potassium: 4 mmol/L (ref 3.5–5.1)
Sodium: 135 mmol/L (ref 135–145)

## 2021-02-27 LAB — GLUCOSE, CAPILLARY
Glucose-Capillary: 92 mg/dL (ref 70–99)
Glucose-Capillary: 143 mg/dL — ABNORMAL HIGH (ref 70–99)
Glucose-Capillary: 141 mg/dL — ABNORMAL HIGH (ref 70–99)
Glucose-Capillary: 147 mg/dL — ABNORMAL HIGH (ref 70–99)
Glucose-Capillary: 129 mg/dL — ABNORMAL HIGH (ref 70–99)

## 2021-02-27 LAB — CULTURE, BLOOD (ROUTINE X 2)
Culture: NO GROWTH
Culture: NO GROWTH
Special Requests: ADEQUATE

## 2021-02-27 LAB — CBC
HCT: 25.3 % — ABNORMAL LOW (ref 36.0–46.0)
Hemoglobin: 8.3 g/dL — ABNORMAL LOW (ref 12.0–15.0)
MCH: 29 pg (ref 26.0–34.0)
MCHC: 32.8 g/dL (ref 30.0–36.0)
MCV: 88.5 fL (ref 80.0–100.0)
Platelets: 451 10*3/uL — ABNORMAL HIGH (ref 150–400)
RBC: 2.86 MIL/uL — ABNORMAL LOW (ref 3.87–5.11)
RDW: 15.1 % (ref 11.5–15.5)
WBC: 12.7 10*3/uL — ABNORMAL HIGH (ref 4.0–10.5)
nRBC: 0 % (ref 0.0–0.2)

## 2021-02-27 LAB — VANCOMYCIN, TROUGH: Vancomycin Tr: 20 ug/mL (ref 15–20)

## 2021-02-27 MED ORDER — VANCOMYCIN HCL 750 MG/150ML IV SOLN
750.0000 mg | INTRAVENOUS | Status: DC
  Filled 2021-02-27: qty 150

## 2021-02-27 MED ORDER — DOXYCYCLINE HYCLATE 100 MG PO TABS
100.0000 mg | ORAL_TABLET | Freq: Two times a day (BID) | ORAL | Status: DC
  Administered 2021-02-27 – 2021-03-01 (×5): 100 mg via ORAL
  Filled 2021-02-27 (×5): qty 1

## 2021-02-27 NOTE — Progress Notes (Signed)
Pharmacy Antibiotic Note  [name] [name] is a 56 y.o. female admitted on 02/22/2021. Pharmacy has been consulted for cellulitis dosing.  Day 5 of antibiotics  Plan: Vancomycin peak 32 and vancomycin trough 20. Using 2-level vancomycin AUC calculations. Will adjust to vancomycin 750 mg q24H. Calculated AUC 465.    continue ceftriaxone 2 g IV q24H   Height: 5\' 8"  (172.7 cm) Weight: 99.8 kg (220 lb) IBW/kg (Calculated) : 63.9  Temp (24hrs), Avg:98.5 F (36.9 C), Min:97.6 F (36.4 C), Max:98.9 F (37.2 C)  Recent Labs  Lab 02/22/21 0112 02/22/21 0516 02/22/21 0840 02/22/21 1855 02/23/21 0408 02/24/21 0411 02/25/21 0651 02/26/21 0144 02/26/21 1357 02/27/21 0331 02/27/21 0917  WBC  --   --   --  19.9* 15.7* 13.7* 12.5* 13.2*  --  12.7*  --   CREATININE  --    < >  --  1.56* 1.67* 1.56* 1.54* 1.46*  --  1.40*  --   LATICACIDVEN 0.8  --  0.7 1.0  --   --   --   --   --   --   --   VANCOTROUGH  --   --   --   --   --   --   --   --   --   --  20  VANCOPEAK  --   --   --   --   --   --   --   --  32  --   --    < > = values in this interval not displayed.     Estimated Creatinine Clearance: 56.1 mL/min (A) (by C-G formula based on SCr of 1.4 mg/dL (H)).    Allergies  Allergen Reactions   Latex Rash    Severe itching.    Antimicrobials this admission: Ceftriaxone 1/3 >> Vancomycin 1/3 >>   Microbiology results: 1/3 BCx: NGTD   Thank you for allowing pharmacy to be a part of this patients care.  04/27/21, PharmD Clinical Pharmacist 02/27/2021 9:57 AM

## 2021-02-27 NOTE — Plan of Care (Signed)
No new changes in assessment ° °PLAN OF CARE ONGOING °Problem: Education: °Goal: Knowledge of General Education information will improve °Description: Including pain rating scale, medication(s)/side effects and non-pharmacologic comfort measures °Outcome: Progressing °  °Problem: Health Behavior/Discharge Planning: °Goal: Ability to manage health-related needs will improve °Outcome: Progressing °  °Problem: Clinical Measurements: °Goal: Ability to maintain clinical measurements within normal limits will improve °Outcome: Progressing °Goal: Will remain free from infection °Outcome: Progressing °Goal: Diagnostic test results will improve °Outcome: Progressing °Goal: Respiratory complications will improve °Outcome: Progressing °Goal: Cardiovascular complication will be avoided °Outcome: Progressing °  °Problem: Activity: °Goal: Risk for activity intolerance will decrease °Outcome: Progressing °  °Problem: Nutrition: °Goal: Adequate nutrition will be maintained °Outcome: Progressing °  °Problem: Coping: °Goal: Level of anxiety will decrease °Outcome: Progressing °  °Problem: Elimination: °Goal: Will not experience complications related to bowel motility °Outcome: Progressing °Goal: Will not experience complications related to urinary retention °Outcome: Progressing °  °Problem: Pain Managment: °Goal: General experience of comfort will improve °Outcome: Progressing °  °Problem: Safety: °Goal: Ability to remain free from injury will improve °Outcome: Progressing °  °Problem: Skin Integrity: °Goal: Risk for impaired skin integrity will decrease °Outcome: Progressing °  °

## 2021-02-27 NOTE — Progress Notes (Signed)
PROGRESS NOTE    Kerria Friederichs Nazaire  [id number] DOB: 29-Dec-1965 DOA: 02/22/2021 PCP: [facility]    Brief Narrative:  Mrs. Stuard was admitted to the hospital with the working diagnosis of left lower extremity cellulitis.    56 yo female resident of a group home with the past medical history of HTN, dyslipidemia, CVA, COPD, T2DM, depression and anxiety who presented with left lower extremity rash, pain and edema. Reported worsening rash from the left knee down to her lower extremity for the last 3 to 4 days leading into her hospitalization, associated with decreased ambulation due to pain and edema. Positive fever and chills. On her initial physical examination her temp was 100,4, blood pressure 170/84, HR 92-103, RR 16-22 and oxygen saturation 99%. Lungs were clear to auscultation, heart with S1 and S2 present, abdomen soft, left lower extremity edema, with erythema and tenderness.    Sodium 137, potassium 3.8, chloride 111, bicarb 19, glucose 133, BUN 37, creatinine 1.67, white count 19.9, hemoglobin 8.3, hematocrit 25.4, platelets 357 SARS COVID-19 negative.   Chest radiograph with cardiomegaly, bilateral interstitial infiltrates at bases.   EKG 94 bpm, normal axis, normal intervals, sinus rhythm, no significant ST segment or T wave changes.   Patient was placed on IV antibiotic therapy with good toleration.  Worsening anemia, received 1 unit PRBC transfusion.    PT and OT recommended further therapy at SNF    Now pending transfer to SNF   Assessment & Plan:   Principal Problem:   Cellulitis of left lower extremity Active Problems:   Anxiety   S/P TKR (total knee replacement) using cement, right   Essential hypertension   Depression with anxiety   CKD (chronic kidney disease), stage III (HCC)     Left lower extremity cellulitis, complicated with sepsis present on admission  Clinically improving edema, erythema and pain.  Wbc is 12.7 and she has been  afebrile.  Cultures with no growth.  Left lower extremity US with no DVT, positive reactive prominent left inguinal lymph node.    Will transition antibiotic therapy to doxycycline and continue close monitoring. Patient will need SNF to continue physical and occupational therapy.    2. HTN. Diastolic heart failure (acute on chronic) Continue blood pressure control with amlodipine and metoprolol.  No clinical signs of heart failure decompensation today, she has left unilateral lower extremity edema.   3. CVA old. Patient is living in an group home.  On asa and clopidogrel. Continue with PT and OT.    4. CKD stage 3b, hyponatremia.  Stable renal function with serum cr at 1,40, K is 4,0 and serum bicarbonate at 20. Na is 135, patient is tolerating po well.  Continue close follow up on renal function and electrolytes in 48 hrs   5. Anemia of chronic disease combined with iron deficiency.  Follow up cell count in 48 hrs. Hgb has been stable.    6. Depression and anxiety PRN lorazepam.  Scheduled escitalopram.   7. Obesity class 1  Calculated BMI is 33,4 Follow up as outpatient.    8. T2DM/ dyslipidemia.  Fasting glucose this am is 96, continue close glucose monitoring, patient is tolerating po well.  Basal insulin 12 units.  Continue with atorvastatin.   Status is: Inpatient  Remains inpatient appropriate because: pending transfer to SNF     DVT prophylaxis: Enoxaparin   Code Status:    Full   Family Communication:   No family at the bedside. Yesterday I  spoke with her son at the bedside and all questions were addressed.      Nutrition Status: Nutrition Problem: Increased nutrient needs Etiology: wound healing Signs/Symptoms: estimated needs Interventions: MVI, Juven, Premier Protein     Skin Documentation: Pressure Injury 02/18/20 Rib Right;Lateral Stage 2 -  Partial thickness loss of dermis presenting as a shallow open injury with a red, pink wound bed without  slough. (Active)  02/18/20 1718  Location: Rib  Location Orientation: Right;Lateral  Staging: Stage 2 -  Partial thickness loss of dermis presenting as a shallow open injury with a red, pink wound bed without slough.  Wound Description (Comments):   Present on Admission:      Pressure Injury 02/18/20 Buttocks Right;Lateral Stage 2 -  Partial thickness loss of dermis presenting as a shallow open injury with a red, pink wound bed without slough. (Active)  02/18/20 1719  Location: Buttocks  Location Orientation: Right;Lateral  Staging: Stage 2 -  Partial thickness loss of dermis presenting as a shallow open injury with a red, pink wound bed without slough.  Wound Description (Comments):   Present on Admission: Yes     Antimicrobials:  Ceftriaxone and vancomycin dc 01/08 Doxycycline started 01/08    Subjective: Patient is feeling better, her left leg pain and edema are improving but not yet back to baseline, no nausea or vomiting, no chest pain or dyspnea.   Objective: Vitals:   02/26/21 1547 02/26/21 1956 02/27/21 0354 02/27/21 0729  BP: (!) 141/78 (!) 158/92 (!) 169/85 (!) 167/81  Pulse: 82 87 86 88  Resp: 18 16 16 18   Temp: 97.6 F (36.4 C) 98.2 F (36.8 C) 98.7 F (37.1 C) 98.9 F (37.2 C)  TempSrc:      SpO2: 100% 100% 100% 100%  Weight:      Height:        Intake/Output Summary (Last 24 hours) at 02/27/2021 0948 Last data filed at 02/27/2021 0200 Gross per 24 hour  Intake 3 ml  Output 1000 ml  Net -997 ml   Filed Weights   02/22/21 0133  Weight: 99.8 kg    Examination:   General: Not in pain or dyspnea  Neurology: Awake and alert, non focal  E ENT: no pallor, no icterus, oral mucosa moist Cardiovascular: No JVD. S1-S2 present, rhythmic, no gallops, rubs, or murmurs. Positive left lower extremity edema. Pulmonary: positive breath sounds bilaterally, adequate air movement, no wheezing, rhonchi or rales. Gastrointestinal. Abdomen soft and non tender Skin.  Left leg erythema is improving.  Musculoskeletal: no joint deformities, bilateral knee hypertrophy         Data Reviewed: I have personally reviewed following labs and imaging studies  CBC: Recent Labs  Lab 02/22/21 0111 02/22/21 0840 02/23/21 0408 02/24/21 0411 02/25/21 0651 02/26/21 0144 02/27/21 0331  WBC 15.2*   < > 15.7* 13.7* 12.5* 13.2* 12.7*  NEUTROABS 12.2*  --   --   --   --   --   --   HGB 7.2*   < > 7.4* 7.2* 7.3* 8.3* 8.3*  HCT 22.4*   < > 22.8* 22.2* 22.2* 24.6* 25.3*  MCV 94.9   < > 93.1 91.4 92.1 88.2 88.5  PLT 305   < > 331 343 399 412* 451*   < > = values in this interval not displayed.   Basic Metabolic Panel: Recent Labs  Lab 02/23/21 0408 02/24/21 0411 02/25/21 0651 02/26/21 0144 02/27/21 0331  NA 136 138 136 134* 135  K  3.7 3.9 3.8 3.7 4.0  CL 111 111 108 109 108  CO2 20* 21* 20* 19* 20*  GLUCOSE 103* 104* 112* 112* 96  BUN 34* 38* 39* 40* 46*  CREATININE 1.67* 1.56* 1.54* 1.46* 1.40*  CALCIUM 7.8* 7.8* 7.9* 7.7* 8.2*  MG 2.1  --   --   --   --   PHOS 3.6  --   --   --   --    GFR: Estimated Creatinine Clearance: 56.1 mL/min (A) (by C-G formula based on SCr of 1.4 mg/dL (H)). Liver Function Tests: Recent Labs  Lab 02/22/21 0516  AST 58*  ALT 44  ALKPHOS 82  BILITOT 0.6  PROT 6.7  ALBUMIN 2.2*   No results for input(s): LIPASE, AMYLASE in the last 168 hours. No results for input(s): AMMONIA in the last 168 hours. Coagulation Profile: No results for input(s): INR, PROTIME in the last 168 hours. Cardiac Enzymes: No results for input(s): CKTOTAL, CKMB, CKMBINDEX, TROPONINI in the last 168 hours. BNP (last 3 results) No results for input(s): PROBNP in the last 8760 hours. HbA1C: No results for input(s): HGBA1C in the last 72 hours. CBG: Recent Labs  Lab 02/26/21 1020 02/26/21 1154 02/26/21 1648 02/26/21 2122 02/27/21 0729  GLUCAP 100* 188* 127* 148* 92   Lipid Profile: No results for input(s): CHOL, HDL, LDLCALC, TRIG,  CHOLHDL, LDLDIRECT in the last 72 hours. Thyroid Function Tests: No results for input(s): TSH, T4TOTAL, FREET4, T3FREE, THYROIDAB in the last 72 hours. Anemia Panel: No results for input(s): VITAMINB12, FOLATE, FERRITIN, TIBC, IRON, RETICCTPCT in the last 72 hours.    Radiology Studies: I have reviewed all of the imaging during this hospital visit personally     Scheduled Meds:  amLODipine  10 mg Oral Daily   vitamin C  500 mg Oral Daily   aspirin EC  81 mg Oral Daily   atorvastatin  40 mg Oral QHS   cholecalciferol  1,000 Units Oral q morning   clopidogrel  75 mg Oral Q breakfast   enoxaparin (LOVENOX) injection  0.5 mg/kg Subcutaneous Q24H   escitalopram  10 mg Oral Daily   fluticasone furoate-vilanterol  1 puff Inhalation Daily   furosemide  20 mg Oral Daily   insulin aspart  0-15 Units Subcutaneous TID WC   insulin glargine-yfgn  12 Units Subcutaneous QHS   iron polysaccharides  150 mg Oral Daily   megestrol  40 mg Oral Daily   metoprolol tartrate  25 mg Oral Daily   multivitamin with minerals  1 tablet Oral Daily   nutrition supplement (JUVEN)  1 packet Oral BID BM   pantoprazole  40 mg Oral Daily   Ensure Max Protein  11 oz Oral QHS   sodium chloride flush  3 mL Intravenous Q12H   sodium chloride flush  3 mL Intravenous Q12H   Continuous Infusions:  sodium chloride     cefTRIAXone (ROCEPHIN)  IV 2 g (02/26/21 1212)   vancomycin Stopped (02/27/21 0930)     LOS: 5 days        Zarya Lasseigne Gerome Apley, MD

## 2021-02-27 NOTE — TOC Progression Note (Addendum)
Transition of Care [facility]) - Progression Note    Patient Details  Name: [name] [name] MRN: [id number] Date of Birth: 16-Apr-1965  Transition of Care [facility]) CM/SW Contact  Margarito Liner, LCSW Phone Number: 02/27/2021, 8:07 AM  Clinical Narrative:   No bed offers this morning.  3:08 pm: No bed offers.  Expected Discharge Plan: Assisted Living Barriers to Discharge: Continued Medical Work up  Expected Discharge Plan and Services Expected Discharge Plan: Assisted Living     Post Acute Care Choice: NA Living arrangements for the past 2 months: Assisted Living Facility                                       Social Determinants of Health (SDOH) Interventions    Readmission Risk Interventions Readmission Risk Prevention Plan 02/23/2021 01/02/2020  Transportation Screening Complete Complete  HRI or Home Care Consult - Complete  Social Work Consult for Recovery Care Planning/Counseling - Complete  Palliative Care Screening - Not Applicable  Medication Review Oceanographer) Complete Complete  PCP or Specialist appointment within 3-5 days of discharge Complete -  SW Recovery Care/Counseling Consult Complete -  Palliative Care Screening Not Applicable -  Skilled Nursing Facility Not Applicable -  Some recent data might be hidden

## 2021-02-28 DIAGNOSIS — L03116 Cellulitis of left lower limb: Secondary | ICD-10-CM | POA: Diagnosis not present

## 2021-02-28 LAB — GLUCOSE, CAPILLARY
Glucose-Capillary: 98 mg/dL (ref 70–99)
Glucose-Capillary: 128 mg/dL — ABNORMAL HIGH (ref 70–99)
Glucose-Capillary: 149 mg/dL — ABNORMAL HIGH (ref 70–99)
Glucose-Capillary: 153 mg/dL — ABNORMAL HIGH (ref 70–99)

## 2021-02-28 LAB — CBC WITH DIFFERENTIAL/PLATELET
Abs Immature Granulocytes: 0.08 10*3/uL — ABNORMAL HIGH (ref 0.00–0.07)
Basophils Absolute: 0 10*3/uL (ref 0.0–0.1)
Basophils Relative: 0 %
Eosinophils Absolute: 0.4 10*3/uL (ref 0.0–0.5)
Eosinophils Relative: 4 %
HCT: 26 % — ABNORMAL LOW (ref 36.0–46.0)
Hemoglobin: 8.4 g/dL — ABNORMAL LOW (ref 12.0–15.0)
Immature Granulocytes: 1 %
Lymphocytes Relative: 18 %
Lymphs Abs: 2 10*3/uL (ref 0.7–4.0)
MCH: 28.8 pg (ref 26.0–34.0)
MCHC: 32.3 g/dL (ref 30.0–36.0)
MCV: 89 fL (ref 80.0–100.0)
Monocytes Absolute: 0.9 10*3/uL (ref 0.1–1.0)
Monocytes Relative: 8 %
Neutro Abs: 7.7 10*3/uL (ref 1.7–7.7)
Neutrophils Relative %: 69 %
Platelets: 495 10*3/uL — ABNORMAL HIGH (ref 150–400)
RBC: 2.92 MIL/uL — ABNORMAL LOW (ref 3.87–5.11)
RDW: 14.9 % (ref 11.5–15.5)
WBC: 11.1 10*3/uL — ABNORMAL HIGH (ref 4.0–10.5)
nRBC: 0 % (ref 0.0–0.2)

## 2021-02-28 LAB — BASIC METABOLIC PANEL
Anion gap: 7 (ref 5–15)
BUN: 47 mg/dL — ABNORMAL HIGH (ref 6–20)
CO2: 22 mmol/L (ref 22–32)
Calcium: 8 mg/dL — ABNORMAL LOW (ref 8.9–10.3)
Chloride: 107 mmol/L (ref 98–111)
Creatinine, Ser: 1.3 mg/dL — ABNORMAL HIGH (ref 0.44–1.00)
GFR, Estimated: 49 mL/min — ABNORMAL LOW (ref >60)
Glucose, Bld: 99 mg/dL (ref 70–99)
Potassium: 4 mmol/L (ref 3.5–5.1)
Sodium: 136 mmol/L (ref 135–145)

## 2021-02-28 LAB — MRSA NEXT GEN BY PCR, NASAL: MRSA by PCR Next Gen: NOT DETECTED

## 2021-02-28 LAB — MAGNESIUM: Magnesium: 2 mg/dL (ref 1.7–2.4)

## 2021-02-28 NOTE — Progress Notes (Signed)
Physical Therapy Treatment Patient Details Name: [name] [name] MRN: [id number] DOB: October 18, 1965 Today's Date: 02/28/2021   History of Present Illness Pt is a 56 year old female admitted with LLE pain and swelling started from the L knee progressed to LLE x3 to 4 days. Admitted on IV antibiotics. PMH include HTN, HLD, CVA, COPD, NIDDM T2, depression/anxiety, history of recurrent cellulitis    PT Comments    Pt received supine in bed, eager to transfer to the chair. She remains limited by pain in the LLE. She has improved as she requires assist of only 1 person today. MOD A provided during STS and stand-pivot transfer; RW used. Pt crying out in pain each time she WB through LLE. Max VC for encouragement and technique. Pt follows safety instructions well. PT assisted pt with changing into a clean gown. RN notified pt is in recliner with chair alarm and is asking for pain meds. Continue to recommend SNF as pt is below her baseline and would benefit from skilled PT to return to PLOF prior to returning to ALF.    Recommendations for follow up therapy are one component of a multi-disciplinary discharge planning process, led by the attending physician.  Recommendations may be updated based on patient status, additional functional criteria and insurance authorization.  Follow Up Recommendations  Skilled nursing-short term rehab (<3 hours/day)     Assistance Recommended at Discharge Frequent or constant Supervision/Assistance  Patient can return home with the following A lot of help with walking and/or transfers;A lot of help with bathing/dressing/bathroom;Help with stairs or ramp for entrance;Assist for transportation   Equipment Recommendations  Other (comment) (TBD at next venue of care)    Recommendations for Other Services       Precautions / Restrictions Precautions Precautions: Fall Restrictions Weight Bearing Restrictions: No Other Position/Activity Restrictions: No WB  restrictions/orders however LLE cellulitis, pt limited WB due to pain.     Mobility  Bed Mobility Overal bed mobility: Needs Assistance Bed Mobility: Supine to Sit     Supine to sit: Mod assist     General bed mobility comments: MOD A to manage trunk to upright    Transfers Overall transfer level: Needs assistance Equipment used: Rolling walker (2 wheels) Transfers: Sit to/from Stand;Bed to chair/wheelchair/BSC Sit to Stand: Mod assist;From elevated surface Stand pivot transfers: Mod assist;From elevated surface         General transfer comment: MOD A to lift for STS and steady throughout stand pivot. Max verbal encouragement for pt to complete transfer.    Ambulation/Gait               General Gait Details: deferred - due to standing tolerance and pain   Stairs             Wheelchair Mobility    Modified Rankin (Stroke Patients Only)       Balance Overall balance assessment: Needs assistance Sitting-balance support: Feet supported Sitting balance-Leahy Scale: Good     Standing balance support: Bilateral upper extremity supported;During functional activity;Reliant on assistive device for balance Standing balance-Leahy Scale: Poor Standing balance comment: RW and MOD A to steady during transition to standing, to remain standing and during pivot transfer                            Cognition Arousal/Alertness: Awake/alert Behavior During Therapy: Flat affect Overall Cognitive Status: No family/caregiver present to determine baseline cognitive functioning Area of Impairment: Following commands  Following Commands: Follows one step commands with increased time;Follows multi-step commands inconsistently                Exercises      General Comments        Pertinent Vitals/Pain Pain Assessment: Faces Faces Pain Scale: Hurts whole lot Pain Location: LLE during mobility/WB Pain Descriptors /  Indicators: Discomfort;Moaning;Crying Pain Intervention(s): Limited activity within patient's tolerance;Repositioned;Monitored during session;Patient requesting pain meds-RN notified    Home Living                          Prior Function            PT Goals (current goals can now be found in the care plan section) Acute Rehab PT Goals PT Goal Formulation: Patient unable to participate in goal setting    Frequency    Min 2X/week      PT Plan      Co-evaluation              AM-PAC PT "6 Clicks" Mobility   Outcome Measure  Help needed turning from your back to your side while in a flat bed without using bedrails?: A Little Help needed moving from lying on your back to sitting on the side of a flat bed without using bedrails?: A Little Help needed moving to and from a bed to a chair (including a wheelchair)?: A Little Help needed standing up from a chair using your arms (e.g., wheelchair or bedside chair)?: A Little Help needed to walk in hospital room?: Total Help needed climbing 3-5 steps with a railing? : Total 6 Click Score: 14    End of Session Equipment Utilized During Treatment: Gait belt Activity Tolerance: Patient limited by pain Patient left: in chair;with call bell/phone within reach;with chair alarm set Nurse Communication: Mobility status;Precautions;Patient requests pain meds PT Visit Diagnosis: Unsteadiness on feet (R26.81);Muscle weakness (generalized) (M62.81);Difficulty in walking, not elsewhere classified (R26.2);Other abnormalities of gait and mobility (R26.89)     Time: 0240-9735 PT Time Calculation (min) (ACUTE ONLY): 17 min  Charges:  $Therapeutic Activity: 8-22 mins                     Basilia Jumbo PT, DPT 02/28/21 5:08 PM [phone]

## 2021-02-28 NOTE — TOC Progression Note (Addendum)
Transition of Care [facility]) - Progression Note    Patient Details  Name: [name] [name] MRN: [id number] Date of Birth: [birth year]  [facility]) CM/SW Contact  Margarito Liner, LCSW Phone Number: 02/28/2021, 9:38 AM  Clinical Narrative:   No bed offers this morning. Expanded search to surrounding counties.  1:55 pm: Still no bed offers. Asked [facility], [facility], and [facility] to review. Spoke to [employer] at [facility]. He said they've had issues with finding SNF placement/HH in the past because of her insurance.  Expected Discharge Plan: Assisted Living Barriers to Discharge: Continued Medical Work up  Expected Discharge Plan and Services Expected Discharge Plan: Assisted Living     Post Acute Care Choice: NA Living arrangements for the past 2 months: Assisted Living Facility                                       Social Determinants of Health (SDOH) Interventions    Readmission Risk Interventions Readmission Risk Prevention Plan 02/23/2021 01/02/2020  Transportation Screening Complete Complete  HRI or Home Care Consult - Complete  Social Work Consult for Recovery Care Planning/Counseling - Complete  Palliative Care Screening - Not Applicable  Medication Review Oceanographer) Complete Complete  PCP or Specialist appointment within 3-5 days of discharge Complete -  SW Recovery Care/Counseling Consult Complete -  Palliative Care Screening Not Applicable -  Skilled Nursing Facility Not Applicable -  Some recent data might be hidden

## 2021-02-28 NOTE — Progress Notes (Signed)
PROGRESS NOTE    [name] [name]  [id number] DOB: 10-Mar-1965 DOA: 02/22/2021 PCP: [facility]    Brief Narrative:  56 yo female resident of a group home with the past medical history of HTN, dyslipidemia, CVA, COPD, T2DM, depression and anxiety who presented with left lower extremity rash, pain and edema. Reported worsening rash from the left knee down to her lower extremity for the last 3 to 4 days leading into her hospitalization, associated with decreased ambulation due to pain and edema. Positive fever and chills. On her initial physical examination her temp was 100,4, blood pressure 170/84, HR 92-103, RR 16-22 and oxygen saturation 99%. Lungs were clear to auscultation, heart with S1 and S2 present, abdomen soft, left lower extremity edema, with erythema and tenderness.  Physical exam improved with IV antibiotics.  Did have worsening anemia requiring transfusion 1 unit PRBC.  No clear blood loss.  Therapy recommending skilled nursing facility.  Patient medically stable for discharge as of 1/8.  As of 1/9 we are currently pending bed offers.   Assessment & Plan:   Principal Problem:   Cellulitis of left lower extremity Active Problems:   Anxiety   S/P TKR (total knee replacement) using cement, right   Essential hypertension   Depression with anxiety   CKD (chronic kidney disease), stage III (HCC)    Left lower extremity cellulitis, complicated with sepsis present on admission  Clinically improving edema, erythema and pain.  Wbc is 12.7 and she has been afebrile.  Cultures with no growth.  Left lower extremity US with no DVT, positive reactive prominent left inguinal lymph node.  Plan: Continue doxycycline p.o. Vitals per unit protocol, monitor fever curve Stable for discharge to skilled nursing facility Pending bed   2. HTN. Diastolic heart failure (acute on chronic) Continue blood pressure control with amlodipine and metoprolol.  No clinical signs of  heart failure decompensation today, she has left unilateral lower extremity edema.    3. CVA old. Patient is living in an group home.  On asa and clopidogrel. Continue with PT and OT.    4. CKD stage 3b, hyponatremia.  Stable renal function with serum cr at 1,40, K is 4,0 and serum bicarbonate at 20. Na is 135, patient is tolerating po well.  Continue close follow up on renal function and electrolytes in 48 hrs   5. Anemia of chronic disease combined with iron deficiency.  Follow up cell count in 48 hrs. Hgb has been stable.    6. Depression and anxiety PRN lorazepam.  Scheduled escitalopram.   7. Obesity class 1  Calculated BMI is 33,4 Follow up as outpatient.    8. T2DM/ dyslipidemia.  Reasonable glucose control over interval Basal insulin 12 units.  Continue with atorvastatin.      DVT prophylaxis: SQ Lovenox Code Status: Full Family Communication: None today Disposition Plan: Status is: Inpatient  Remains inpatient appropriate because: Unsafe discharge plan.  Pending bed offers.  Patient stable for discharge to skilled nursing facility when bed is found.       Level of care: Med-Surg  Consultants:  None  Procedures:  None   Antimicrobials: Doxycycline   Subjective: Patient seen and examined for resting comfortably in bed.  No visible distress.  No pain complaints.  Objective: Vitals:   02/27/21 1601 02/27/21 2030 02/28/21 0401 02/28/21 0853  BP: 138/89 (!) 154/84 (!) 169/84 (!) 169/84  Pulse: 85 81 81 87  Resp: 18 16 16 18   Temp: 98.3 F (  36.8 C) 99.5 F (37.5 C) 98 F (36.7 C) 98.7 F (37.1 C)  TempSrc: Oral Oral  Oral  SpO2: 99% 100% 100% 100%  Weight:      Height:        Intake/Output Summary (Last 24 hours) at 02/28/2021 1324 Last data filed at 02/28/2021 1022 Gross per 24 hour  Intake 10 ml  Output 1750 ml  Net -1740 ml   Filed Weights   02/22/21 0133  Weight: 99.8 kg    Examination:  General exam: No acute  distress Respiratory system: Clear to auscultation. Respiratory effort normal. Cardiovascular system: S1 & S2 heard, RRR. No JVD, murmurs, rubs, gallops or clicks.  LLE edema Gastrointestinal system: Abdomen is nondistended, soft and nontender. No organomegaly or masses felt. Normal bowel sounds heard. Central nervous system: Alert and oriented. No focal neurological deficits. Extremities: Symmetric 5 x 5 power. Skin: LLE erythematous, improving over interval, cellulitic changes resolving Psychiatry: Judgement and insight appear normal. Mood & affect appropriate.     Data Reviewed: I have personally reviewed following labs and imaging studies  CBC: Recent Labs  Lab 02/22/21 0111 02/22/21 0840 02/24/21 0411 02/25/21 0651 02/26/21 0144 02/27/21 0331 02/28/21 0759  WBC 15.2*   < > 13.7* 12.5* 13.2* 12.7* 11.1*  NEUTROABS 12.2*  --   --   --   --   --  7.7  HGB 7.2*   < > 7.2* 7.3* 8.3* 8.3* 8.4*  HCT 22.4*   < > 22.2* 22.2* 24.6* 25.3* 26.0*  MCV 94.9   < > 91.4 92.1 88.2 88.5 89.0  PLT 305   < > 343 399 412* 451* 495*   < > = values in this interval not displayed.   Basic Metabolic Panel: Recent Labs  Lab 02/23/21 0408 02/24/21 0411 02/25/21 0651 02/26/21 0144 02/27/21 0331 02/28/21 0759  NA 136 138 136 134* 135 136  K 3.7 3.9 3.8 3.7 4.0 4.0  CL 111 111 108 109 108 107  CO2 20* 21* 20* 19* 20* 22  GLUCOSE 103* 104* 112* 112* 96 99  BUN 34* 38* 39* 40* 46* 47*  CREATININE 1.67* 1.56* 1.54* 1.46* 1.40* 1.30*  CALCIUM 7.8* 7.8* 7.9* 7.7* 8.2* 8.0*  MG 2.1  --   --   --   --  2.0  PHOS 3.6  --   --   --   --   --    GFR: Estimated Creatinine Clearance: 60.4 mL/min (A) (by C-G formula based on SCr of 1.3 mg/dL (H)). Liver Function Tests: Recent Labs  Lab 02/22/21 0516  AST 58*  ALT 44  ALKPHOS 82  BILITOT 0.6  PROT 6.7  ALBUMIN 2.2*   No results for input(s): LIPASE, AMYLASE in the last 168 hours. No results for input(s): AMMONIA in the last 168  hours. Coagulation Profile: No results for input(s): INR, PROTIME in the last 168 hours. Cardiac Enzymes: No results for input(s): CKTOTAL, CKMB, CKMBINDEX, TROPONINI in the last 168 hours. BNP (last 3 results) No results for input(s): PROBNP in the last 8760 hours. HbA1C: No results for input(s): HGBA1C in the last 72 hours. CBG: Recent Labs  Lab 02/27/21 1602 02/27/21 1634 02/27/21 2048 02/28/21 0755 02/28/21 1210  GLUCAP 141* 147* 129* 98 128*   Lipid Profile: No results for input(s): CHOL, HDL, LDLCALC, TRIG, CHOLHDL, LDLDIRECT in the last 72 hours. Thyroid Function Tests: No results for input(s): TSH, T4TOTAL, FREET4, T3FREE, THYROIDAB in the last 72 hours. Anemia Panel: No results  for input(s): VITAMINB12, FOLATE, FERRITIN, TIBC, IRON, RETICCTPCT in the last 72 hours. Sepsis Labs: Recent Labs  Lab 02/22/21 0112 02/22/21 0840 02/22/21 1855  LATICACIDVEN 0.8 0.7 1.0    Recent Results (from the past 240 hour(s))  Blood culture (routine x 2)     Status: None   Collection Time: 02/22/21  1:33 AM   Specimen: BLOOD  Result Value Ref Range Status   Specimen Description BLOOD LEFT FOREARM  Final   Special Requests   Final    BOTTLES DRAWN AEROBIC AND ANAEROBIC Blood Culture results may not be optimal due to an inadequate volume of blood received in culture bottles   Culture   Final    NO GROWTH 5 DAYS Performed at [facility], [address], [place], [place] [address]    Report Status 02/27/2021 FINAL  Final  Blood culture (routine x 2)     Status: None   Collection Time: 02/22/21  5:16 AM   Specimen: BLOOD  Result Value Ref Range Status   Specimen Description BLOOD BLOOD RIGHT WRIST  Final   Special Requests   Final    BOTTLES DRAWN AEROBIC AND ANAEROBIC Blood Culture adequate volume   Culture   Final    NO GROWTH 5 DAYS Performed at [facility], [address] [place], [place] [address]    Report Status 02/27/2021 FINAL  Final  Resp  Panel by RT-PCR (Flu A&B, Covid) Nasopharyngeal Swab     Status: None   Collection Time: 02/22/21  8:40 AM   Specimen: Nasopharyngeal Swab; Nasopharyngeal(NP) swabs in vial transport medium  Result Value Ref Range Status   SARS Coronavirus 2 by RT PCR NEGATIVE NEGATIVE Final    Comment: (NOTE) SARS-CoV-2 target nucleic acids are NOT DETECTED.  The SARS-CoV-2 RNA is generally detectable in upper respiratory specimens during the acute phase of infection. The lowest concentration of SARS-CoV-2 viral copies this assay can detect is 138 copies/mL. A negative result does not preclude SARS-Cov-2 infection and should not be used as the sole basis for treatment or other patient management decisions. A negative result may occur with  improper specimen collection/handling, submission of specimen other than nasopharyngeal swab, presence of viral mutation(s) within the areas targeted by this assay, and inadequate number of viral copies(<138 copies/mL). A negative result must be combined with clinical observations, patient history, and epidemiological information. The expected result is Negative.  Fact Sheet for Patients:  EntrepreneurPulse.com.au  Fact Sheet for Healthcare Providers:  IncredibleEmployment.be  This test is no t yet approved or cleared by the [place] FDA and  has been authorized for detection and/or diagnosis of SARS-CoV-2 by FDA under an Emergency Use Authorization (EUA). This EUA will remain  in effect (meaning this test can be used) for the duration of the COVID-19 declaration under Section 564(b)(1) of the Act, 21 U.S.C.section 360bbb-3(b)(1), unless the authorization is terminated  or revoked sooner.       Influenza A by PCR NEGATIVE NEGATIVE Final   Influenza B by PCR NEGATIVE NEGATIVE Final    Comment: (NOTE) The Xpert Xpress SARS-CoV-2/FLU/RSV plus assay is intended as an aid in the diagnosis of influenza from Nasopharyngeal  swab specimens and should not be used as a sole basis for treatment. Nasal washings and aspirates are unacceptable for Xpert Xpress SARS-CoV-2/FLU/RSV testing.  Fact Sheet for Patients: EntrepreneurPulse.com.au  Fact Sheet for Healthcare Providers: IncredibleEmployment.be  This test is not yet approved or cleared by the [place] FDA and has been  authorized for detection and/or diagnosis of SARS-CoV-2 by FDA under an Emergency Use Authorization (EUA). This EUA will remain in effect (meaning this test can be used) for the duration of the COVID-19 declaration under Section 564(b)(1) of the Act, 21 U.S.C. section 360bbb-3(b)(1), unless the authorization is terminated or revoked.  Performed at [facility], [address], [place], [place] [address]          Radiology Studies: No results found.      Scheduled Meds:  amLODipine  10 mg Oral Daily   vitamin C  500 mg Oral Daily   aspirin EC  81 mg Oral Daily   atorvastatin  40 mg Oral QHS   cholecalciferol  1,000 Units Oral q morning   clopidogrel  75 mg Oral Q breakfast   doxycycline  100 mg Oral Q12H   enoxaparin (LOVENOX) injection  0.5 mg/kg Subcutaneous Q24H   escitalopram  10 mg Oral Daily   fluticasone furoate-vilanterol  1 puff Inhalation Daily   furosemide  20 mg Oral Daily   insulin aspart  0-15 Units Subcutaneous TID WC   insulin glargine-yfgn  12 Units Subcutaneous QHS   iron polysaccharides  150 mg Oral Daily   megestrol  40 mg Oral Daily   metoprolol tartrate  25 mg Oral Daily   multivitamin with minerals  1 tablet Oral Daily   nutrition supplement (JUVEN)  1 packet Oral BID BM   pantoprazole  40 mg Oral Daily   Ensure Max Protein  11 oz Oral QHS   sodium chloride flush  3 mL Intravenous Q12H   sodium chloride flush  3 mL Intravenous Q12H   Continuous Infusions:  sodium chloride       LOS: 6 days    Time spent: 25 minutes    Sidney Ace, MD [facility]   If 7PM-7AM, please contact night-coverage  02/28/2021, 1:24 PM

## 2021-02-28 NOTE — Progress Notes (Signed)
Nutrition Follow-up  DOCUMENTATION CODES:   Obesity unspecified  INTERVENTION:   -Continue 1 packet Juven BID, each packet provides 95 calories, 2.5 grams of protein (collagen), and 9.8 grams of carbohydrate (3 grams sugar); also contains 7 grams of L-arginine and L-glutamine, 300 mg vitamin C, 15 mg vitamin E, 1.2 mcg vitamin B-12, 9.5 mg zinc, 200 mg calcium, and 1.5 g  Calcium Beta-hydroxy-Beta-methylbutyrate to support wound healing  -Continue Ensure Max po daily, each supplement provides 150 kcal and 30 grams of protein -Continue MVI with minerals daily   NUTRITION DIAGNOSIS:   Increased nutrient needs related to wound healing as evidenced by estimated needs.  Ongoing  GOAL:   Patient will meet greater than or equal to 90% of their needs  Progressing   MONITOR:   PO intake, Supplement acceptance, Labs, Weight trends, Skin, I & O's  REASON FOR ASSESSMENT:   Consult Assessment of nutrition requirement/status  ASSESSMENT:   [name] [name] is a 56 y.o. female with Past medical history of HTN, HLD, CVA, COPD, NIDDM T2, depression/anxiety, as reviewed from EMR, presented at [facility] ED with complaining of left lower extremity pain and swelling.  Patient stated that her swelling and pain started from the left knee progressed to the lower extremity over the past 3 to 4 days.  Patient does have history of recurrent cellulitis of the left lower extremity.  Patient has difficulty getting around due to the pain and swelling.  Patient reported fever and chills.  Patient denied any chest pain, no shortness of breath, no palpitations.  Reviewed I/O's: -940 ml x 24 hours and -1.5 L since admission  Pt unavailable at time of visit. Attempted to speak with pt via call to hospital room phone, however, unable to reach.   Pt remains with good oral intake. Noted meal completions 95-100%. Pt is taking supplements per MAR.   Per TOC notes, plan for SNF placement at discharge.     Medications reviewed and include vitamin C, vitamin D3 and lasix.   Obesity is a complex, chronic medical condition that is optimally managed by a multidisciplinary care team. Weight loss is not an ideal goal for an acute inpatient hospitalization. However, if further work-up for obesity is warranted, consider outpatient referral to outpatient bariatric service and/or [place].    Labs reviewed: CBGS: 92-147 (inpatient orders for glycemic control are 0-15 units insulin aspart TID with meals and 12 units insulin glargine-yfgn daily).    Diet Order:   Diet Order             Diet Carb Modified Fluid consistency: Thin; Room service appropriate? Yes  Diet effective now                   EDUCATION NEEDS:   Education needs have been addressed  Skin:  Skin Assessment: Skin Integrity Issues: Skin Integrity Issues:: Other (Comment) Other: open wound to lt pretibial  Last BM:  02/27/21  Height:   Ht Readings from Last 1 Encounters:  02/22/21 5\' 8"  (1.727 m)    Weight:   Wt Readings from Last 1 Encounters:  02/22/21 99.8 kg    Ideal Body Weight:  63.6 kg  BMI:  Body mass index is 33.45 kg/m.  Estimated Nutritional Needs:   Kcal:  1900-2100  Protein:  110-125 grams  Fluid:  > 1.9 L    Loistine Chance, RD, LDN, [place] Registered Dietitian II Certified Diabetes Care and Education Specialist Please refer to  AMION for RD and/or RD on-call/weekend/after hours pager

## 2021-03-01 ENCOUNTER — Inpatient Hospital Stay: Payer: Medicaid Other

## 2021-03-01 DIAGNOSIS — L03116 Cellulitis of left lower limb: Secondary | ICD-10-CM | POA: Diagnosis not present

## 2021-03-01 LAB — CREATININE, SERUM
Creatinine, Ser: 1.38 mg/dL — ABNORMAL HIGH (ref 0.44–1.00)
GFR, Estimated: 45 mL/min — ABNORMAL LOW (ref >60)

## 2021-03-01 LAB — GLUCOSE, CAPILLARY
Glucose-Capillary: 93 mg/dL (ref 70–99)
Glucose-Capillary: 105 mg/dL — ABNORMAL HIGH (ref 70–99)
Glucose-Capillary: 197 mg/dL — ABNORMAL HIGH (ref 70–99)
Glucose-Capillary: 185 mg/dL — ABNORMAL HIGH (ref 70–99)

## 2021-03-01 MED ORDER — DOXYCYCLINE HYCLATE 100 MG PO TABS
100.0000 mg | ORAL_TABLET | Freq: Two times a day (BID) | ORAL | Status: DC
  Administered 2021-03-01 – 2021-03-03 (×5): 100 mg via ORAL
  Filled 2021-03-01 (×5): qty 1

## 2021-03-01 NOTE — TOC Progression Note (Addendum)
Transition of Care [facility]) - Progression Note    Patient Details  Name: [name] [name] MRN: [id number] Date of Birth: Apr 12, 1965  [facility]) CM/SW Contact  Margarito Liner, LCSW Phone Number: 03/01/2021, 9:44 AM  Clinical Narrative:  Left messages for [facility] and [facility] admissions coordinators to see if they will be able to offer a bed.   11:08 am: [facility] unable to offer a bed. No response from [facility] yet. Only bed offer is [facility] in [place]. Left voicemail for legal guardian to notify.  Expected Discharge Plan: Assisted Living Barriers to Discharge: Continued Medical Work up  Expected Discharge Plan and Services Expected Discharge Plan: Assisted Living     Post Acute Care Choice: NA Living arrangements for the past 2 months: Assisted Living Facility                                       Social Determinants of Health (SDOH) Interventions    Readmission Risk Interventions Readmission Risk Prevention Plan 02/23/2021 01/02/2020  Transportation Screening Complete Complete  HRI or Home Care Consult - Complete  Social Work Consult for Recovery Care Planning/Counseling - Complete  Palliative Care Screening - Not Applicable  Medication Review Oceanographer) Complete Complete  PCP or Specialist appointment within 3-5 days of discharge Complete -  SW Recovery Care/Counseling Consult Complete -  Palliative Care Screening Not Applicable -  Skilled Nursing Facility Not Applicable -  Some recent data might be hidden

## 2021-03-01 NOTE — Progress Notes (Signed)
[facility] NAME: [name] [name]    MR#:  [id number]  DATE OF BIRTH:  [birth year]  SUBJECTIVE:   sitting in the chair. Answers yes to all questions. No family at bedside.  REVIEW OF SYSTEMS:   Review of Systems  Unable to perform ROS: Psychiatric disorder  Tolerating Diet:yes Tolerating PT: rehab  DRUG ALLERGIES:   Allergies  Allergen Reactions   Latex Rash    Severe itching.    VITALS:  Blood pressure (!) 161/82, pulse 85, temperature 98.4 F (36.9 C), resp. rate 16, height 5\' 8"  (1.727 m), weight 99.8 kg, SpO2 100 %.  PHYSICAL EXAMINATION:   Physical Exam  GENERAL:  56 y.o.-year-old patient lying in the bed with no acute distress.  HEENT: Head atraumatic, normocephalic. Oropharynx and nasopharynx clear.  LUNGS: Normal breath sounds bilaterally, no wheezing, rales, rhonchi. No use of accessory muscles of respiration.  CARDIOVASCULAR: S1, S2 normal. No murmurs, rubs, or gallops.  ABDOMEN: Soft, nontender, nondistended. Bowel sounds present. No organomegaly or mass.  EXTREMITIES: left lower extremity dressing present. Bilateral lower extremity mild edema. NEUROLOGIC: nonfocal PSYCHIATRIC:  patient is alert  SKIN:  Pressure Injury 02/18/20 Rib Right;Lateral Stage 2 -  Partial thickness loss of dermis presenting as a shallow open injury with a red, pink wound bed without slough. (Active)  02/18/20 1718  Location: Rib  Location Orientation: Right;Lateral  Staging: Stage 2 -  Partial thickness loss of dermis presenting as a shallow open injury with a red, pink wound bed without slough.  Wound Description (Comments):   Present on Admission:      Pressure Injury 02/18/20 Buttocks Right;Lateral Stage 2 -  Partial thickness loss of dermis presenting as a shallow open injury with a red, pink wound bed without slough. (Active)  02/18/20 1719  Location: Buttocks  Location Orientation: Right;Lateral  Staging: Stage 2 -   Partial thickness loss of dermis presenting as a shallow open injury with a red, pink wound bed without slough.  Wound Description (Comments):   Present on Admission: Yes        LABORATORY PANEL:  CBC Recent Labs  Lab 02/28/21 0759  WBC 11.1*  HGB 8.4*  HCT 26.0*  PLT 495*    Chemistries  Recent Labs  Lab 02/28/21 0759 03/01/21 0457  NA 136  --   K 4.0  --   CL 107  --   CO2 22  --   GLUCOSE 99  --   BUN 47*  --   CREATININE 1.30* 1.38*  CALCIUM 8.0*  --   MG 2.0  --    Cardiac Enzymes No results for input(s): TROPONINI in the last 168 hours. RADIOLOGY:  DG Elbow 2 Views Left  Result Date: 03/01/2021 CLINICAL DATA:  Elbow pain without known injury. EXAM: LEFT ELBOW - 2 VIEW COMPARISON:  None. FINDINGS: There is no evidence of fracture, dislocation, or joint effusion. There is no evidence of arthropathy or other focal bone abnormality. Soft tissues are unremarkable. IMPRESSION: Negative radiographs. Electronically Signed   By: Nelson Chimes M.D.   On: 03/01/2021 11:05   ASSESSMENT AND PLAN:  56 yo female resident of a group home with the past medical history of HTN, dyslipidemia, CVA, COPD, T2DM, depression and anxiety who presented with left lower extremity rash, pain and edema. Reported worsening rash from the left knee down to her lower extremity for the last 3 to 4 days leading into her hospitalization, associated with  decreased ambulation due to pain and edema.   Left lower extremity cellulitis, complicated with sepsis present on admission  --Clinically improving edema, erythema and pain.  --Wbc is 12.7 and she has been afebrile.  --Cultures with no growth.  --Left lower extremity US with no DVT, positive reactive prominent left inguinal lymph node.  --Continue doxycycline p.o.--total 10 days --Stable for discharge to skilled nursing facility Pending bed   2. HTN. Diastolic heart failure (acute on chronic) --Continue blood pressure control with amlodipine and  metoprolol.  --No clinical signs of heart failure decompensation today, she has left unilateral lower extremity edema.    3. CVA old. Patient is living in an group home.  --On asa and clopidogrel.  --Continue with PT and OT.    4. CKD stage 3b, hyponatremia.  --Stable renal function     5. Anemia of chronic disease combined with iron deficiency.  --Hgb has been stable.    6. Depression and anxiety --PRN lorazepam.  --Scheduled escitalopram.   7. Obesity class 1  --Calculated BMI is 33,4   8. T2DM/ dyslipidemia.  --Reasonable glucose control over interval Basal insulin 12 units.  Continue with atorvastatin.        DVT prophylaxis: SQ Lovenox Code Status: Full Family Communication: None today Disposition Plan: Status is: Inpatient   Remains inpatient appropriate because: Unsafe discharge plan.   Patient stable for discharge to skilled nursing facility According to [facility] she has left message for legal guardian regarding that offer         TOTAL TIME TAKING CARE OF THIS PATIENT: 25 minutes.  >50% time spent on counselling and coordination of care  Note: This dictation was prepared with Dragon dictation along with smaller phrase technology. Any transcriptional errors that result from this process are unintentional.  Fritzi Mandes M.D    [facility]   CC: Primary care physician; [facility] Patient ID: [name] [name], female   DOB: September 07, 1965, 56 y.o.   MRN: [id number]

## 2021-03-01 NOTE — Progress Notes (Signed)
Occupational Therapy Treatment Patient Details Name: [name] [name] MRN: [id number] DOB: 07-07-1965 Today's Date: 03/01/2021   History of present illness Pt is a 56 year old female admitted with LLE pain and swelling started from the L knee progressed to LLE x3 to 4 days. Admitted on IV antibiotics. PMH include HTN, HLD, CVA, COPD, NIDDM T2, depression/anxiety, history of recurrent cellulitis   OT comments  Upon entering the room,pt supine in bed and reports having just returned to bed. Pt declined OOB activities and all self care tasks. Pt was agreeable to bed level exercises with use of towel and UEs against gravity. Pt needing assistance to move L UE through planes of movement but no c/o pain. Pt excessively itching UEs and grinding teeth during session. She was able to apply lotion to UEs to decrease need to itch with set up a to obtain needed item. Pt declined any further assistance for bed mobility and reports having all needed items. Bed alarm activated. Pt continues to benefit from OT intervention.    Recommendations for follow up therapy are one component of a multi-disciplinary discharge planning process, led by the attending physician.  Recommendations may be updated based on patient status, additional functional criteria and insurance authorization.    Follow Up Recommendations  Skilled nursing-short term rehab (<3 hours/day)    Assistance Recommended at Discharge Frequent or constant Supervision/Assistance  Patient can return home with the following  A lot of help with bathing/dressing/bathroom;A lot of help with walking and/or transfers   Equipment Recommendations  Other (comment) (defer to next venue of care)       Precautions / Restrictions Precautions Precautions: Fall Restrictions Weight Bearing Restrictions: No LLE Weight Bearing: Weight bearing as tolerated Other Position/Activity Restrictions: No WB restrictions/orders however LLE cellulitis, pt limited WB due to  pain.              ADL either performed or assessed with clinical judgement    Extremity/Trunk Assessment Upper Extremity Assessment Upper Extremity Assessment: Generalized weakness            Vision Patient Visual Report: No change from baseline            Cognition Arousal/Alertness: Awake/alert Behavior During Therapy: Flat affect Overall Cognitive Status: No family/caregiver present to determine baseline cognitive functioning Area of Impairment: Following commands                 Orientation Level: Person;Place;Time;Situation     Following Commands: Follows one step commands with increased time;Follows multi-step commands inconsistently                             Pertinent Vitals/ Pain       Pain Assessment: No/denies pain  Home Living Family/patient expects to be discharged to:: Assisted living Living Arrangements: Children                               Additional Comments: pt reports she lives at [place]          Frequency  Min 2X/week        Progress Toward Goals  OT Goals(current goals can now be found in the care plan section)  Progress towards OT goals: Progressing toward goals  Acute Rehab OT Goals Patient Stated Goal: "to go outside and smoke" OT Goal Formulation: With patient Time For Goal Achievement: 03/10/21 Potential to Achieve Goals:  Good  Plan Discharge plan remains appropriate;Frequency remains appropriate       AM-PAC OT "6 Clicks" Daily Activity     Outcome Measure   Help from another person eating meals?: A Little Help from another person taking care of personal grooming?: A Little Help from another person toileting, which includes using toliet, bedpan, or urinal?: A Lot Help from another person bathing (including washing, rinsing, drying)?: A Lot Help from another person to put on and taking off regular upper body clothing?: A Lot Help from another person to put on and taking off  regular lower body clothing?: A Lot 6 Click Score: 14    End of Session    OT Visit Diagnosis: Unsteadiness on feet (R26.81);Other abnormalities of gait and mobility (R26.89)   Activity Tolerance Patient limited by fatigue   Patient Left with call bell/phone within reach;in bed;with bed alarm set   Nurse Communication Mobility status        Time: 1400-1419 OT Time Calculation (min): 19 min  Charges: OT General Charges $OT Visit: 1 Visit OT Treatments $Therapeutic Activity: 8-22 mins  Jackquline Denmark, MS, OTR/L , CBIS ascom [phone]  03/01/21, 2:25 PM

## 2021-03-01 NOTE — Progress Notes (Signed)
Physical Therapy Treatment Patient Details Name: [name] [name] MRN: [id number] DOB: January 30, 1966 Today's Date: 03/01/2021   History of Present Illness Pt is a 56 year old female admitted with LLE pain and swelling started from the L knee progressed to LLE x3 to 4 days. Admitted on IV antibiotics. PMH include HTN, HLD, CVA, COPD, NIDDM T2, depression/anxiety, history of recurrent cellulitis    PT Comments    Pt received supine in bed. She declines OOB mobility however is agreeable to supine therex. AAROM was provided for all LLE exercises. Pt completed RLE actively with no assist. 20 reps of each. Pt did cry out in pain a couple times throughout session, typically when repositioning in bed. Pt rolled bilaterally for replacement of chuck pad. Due to decreased active mobility of L extremities, she had increased difficulty rolling right. She continues to present with weakness, pain and difficulty with functional mobility. Would benefit from skilled PT to address above deficits and promote optimal return to PLOF.   Recommendations for follow up therapy are one component of a multi-disciplinary discharge planning process, led by the attending physician.  Recommendations may be updated based on patient status, additional functional criteria and insurance authorization.  Follow Up Recommendations  Skilled nursing-short term rehab (<3 hours/day)     Assistance Recommended at Discharge Frequent or constant Supervision/Assistance  Patient can return home with the following A lot of help with walking and/or transfers;A lot of help with bathing/dressing/bathroom;Help with stairs or ramp for entrance;Assist for transportation   Equipment Recommendations  Other (comment) (TBD at next venue of care)    Recommendations for Other Services       Precautions / Restrictions Precautions Precautions: Fall Restrictions Weight Bearing Restrictions: No LLE Weight Bearing: Weight bearing as tolerated Other  Position/Activity Restrictions: No WB restrictions/orders however LLE cellulitis, pt limited WB due to pain.     Mobility  Bed Mobility Overal bed mobility: Needs Assistance Bed Mobility: Rolling Rolling: Mod assist         General bed mobility comments: MOD A to roll, increased difficulty rolling to the left    Transfers                        Ambulation/Gait                   Stairs             Wheelchair Mobility    Modified Rankin (Stroke Patients Only)       Balance                                            Cognition Arousal/Alertness: Awake/alert Behavior During Therapy: Flat affect Overall Cognitive Status: No family/caregiver present to determine baseline cognitive functioning Area of Impairment: Following commands                 Orientation Level: Person;Place;Time;Situation     Following Commands: Follows one step commands with increased time;Follows multi-step commands inconsistently                Exercises General Exercises - Lower Extremity Ankle Circles/Pumps: AROM;Strengthening;Both;20 reps;Supine (F/E of toes on LLE) Short Arc Quad: AROM;Strengthening;Both;20 reps;Supine (AAROM LLE) Heel Slides: AROM;Strengthening;Both;20 reps;Supine (AAROM LLE) Hip ABduction/ADduction: AROM;Strengthening;Both;20 reps;Supine (AAROM LLE) Straight Leg Raises: AROM;Strengthening;Both;20 reps;Supine (AAROM LLE)    General Comments  Pertinent Vitals/Pain Pain Assessment: Faces Faces Pain Scale: Hurts even more Pain Location: LLE Pain Descriptors / Indicators: Discomfort;Moaning;Crying Pain Intervention(s): Limited activity within patient's tolerance;Monitored during session;Repositioned    Home Living Family/patient expects to be discharged to:: Assisted living Living Arrangements: Children                 Additional Comments: pt reports she lives at [facility]    Prior Function             PT Goals (current goals can now be found in the care plan section) Acute Rehab PT Goals PT Goal Formulation: Patient unable to participate in goal setting    Frequency    Min 2X/week      PT Plan      Co-evaluation              AM-PAC PT "6 Clicks" Mobility   Outcome Measure  Help needed turning from your back to your side while in a flat bed without using bedrails?: A Little Help needed moving from lying on your back to sitting on the side of a flat bed without using bedrails?: A Little Help needed moving to and from a bed to a chair (including a wheelchair)?: A Little Help needed standing up from a chair using your arms (e.g., wheelchair or bedside chair)?: A Little Help needed to walk in hospital room?: Total Help needed climbing 3-5 steps with a railing? : Total 6 Click Score: 14    End of Session   Activity Tolerance: Patient limited by pain;Patient tolerated treatment well Patient left: with call bell/phone within reach;in bed;with bed alarm set Nurse Communication: Mobility status PT Visit Diagnosis: Unsteadiness on feet (R26.81);Muscle weakness (generalized) (M62.81);Difficulty in walking, not elsewhere classified (R26.2);Other abnormalities of gait and mobility (R26.89)     Time: [id number] PT Time Calculation (min) (ACUTE ONLY): 19 min  Charges:  $Therapeutic Exercise: 8-22 mins                     Patrina Levering PT, DPT 03/01/21 4:15 PM [phone]

## 2021-03-01 NOTE — Progress Notes (Signed)
PT Cancellation Note  Patient Details Name: [name] MRN: [id number] DOB: [birth year]   Cancelled Treatment:    Reason Eval/Treat Not Completed: Patient declined, no reason specified. Pt in recliner, leaning forward elbows on knees. Pt does not look up while PT is in room. Pt declines participation with PT, no reason provided. PT will return to attempt at later date/time.     Basilia Jumbo PT, DPT 03/01/21 10:55 AM [phone]

## 2021-03-01 NOTE — Plan of Care (Signed)

## 2021-03-01 NOTE — Plan of Care (Signed)
°  Problem: Clinical Measurements: Goal: Will remain free from infection Outcome: Progressing   Problem: Clinical Measurements: Goal: Respiratory complications will improve Outcome: Progressing   Problem: Clinical Measurements: Goal: Cardiovascular complication will be avoided Outcome: Progressing   Problem: Activity: Goal: Risk for activity intolerance will decrease Outcome: Progressing   Problem: Pain Managment: Goal: General experience of comfort will improve Outcome: Progressing   Problem: Safety: Goal: Ability to remain free from injury will improve Outcome: Progressing

## 2021-03-02 DIAGNOSIS — I10 Essential (primary) hypertension: Secondary | ICD-10-CM | POA: Diagnosis not present

## 2021-03-02 DIAGNOSIS — L03116 Cellulitis of left lower limb: Secondary | ICD-10-CM | POA: Diagnosis not present

## 2021-03-02 DIAGNOSIS — N1831 Chronic kidney disease, stage 3a: Secondary | ICD-10-CM | POA: Diagnosis not present

## 2021-03-02 LAB — GLUCOSE, CAPILLARY
Glucose-Capillary: 99 mg/dL (ref 70–99)
Glucose-Capillary: 125 mg/dL — ABNORMAL HIGH (ref 70–99)
Glucose-Capillary: 84 mg/dL (ref 70–99)
Glucose-Capillary: 175 mg/dL — ABNORMAL HIGH (ref 70–99)

## 2021-03-02 NOTE — Progress Notes (Signed)
Physical Therapy Treatment Patient Details Name: [name] [name] MRN: [id number] DOB: [birth year] Today's Date: 03/02/2021   History of Present Illness Pt is a 56 year old female admitted with LLE pain and swelling started from the L knee progressed to LLE x3 to 4 days. Admitted on IV antibiotics. PMH include HTN, HLD, CVA, COPD, NIDDM T2, depression/anxiety, history of recurrent cellulitis    PT Comments    Pt received sitting in recliner and stating she would like to get back into bed. Pt required heavy lifting assist to stand from recliner. MOD A for STS and stand pivot transfer. Steadying provided throughout with heavy VC and encouragement. Pt crying out with pain during WB through LLE. PT encouraged pt to utilize UE on RW for decreased WB; pt limited in WB through LUE due to previous stroke. After transfer, pt declining further exercise due to pain.  Would benefit from skilled PT to address above deficits and promote optimal return to PLOF.   Recommendations for follow up therapy are one component of a multi-disciplinary discharge planning process, led by the attending physician.  Recommendations may be updated based on patient status, additional functional criteria and insurance authorization.  Follow Up Recommendations  Skilled nursing-short term rehab (<3 hours/day)     Assistance Recommended at Discharge Frequent or constant Supervision/Assistance  Patient can return home with the following A lot of help with walking and/or transfers;A lot of help with bathing/dressing/bathroom;Help with stairs or ramp for entrance;Assist for transportation   Equipment Recommendations  Other (comment) (TBD at next venue of care)    Recommendations for Other Services       Precautions / Restrictions Precautions Precautions: Fall Restrictions Weight Bearing Restrictions: No Other Position/Activity Restrictions: No WB restrictions/orders however LLE cellulitis, pt limited WB due to pain.      Mobility  Bed Mobility Overal bed mobility: Needs Assistance Bed Mobility: Sit to Supine       Sit to supine: Mod assist   General bed mobility comments: MOD A to manage BLE back to bed    Transfers Overall transfer level: Needs assistance Equipment used: Rolling walker (2 wheels) Transfers: Sit to/from Stand;Bed to chair/wheelchair/BSC Sit to Stand: Mod assist Stand pivot transfers: Mod assist         General transfer comment: MOD A to lift from recliner and assist with management of RW during pivot. Steadying providing throughout with max VC and encouragement.    Ambulation/Gait               General Gait Details: deferred - due to standing tolerance and pain   Stairs             Wheelchair Mobility    Modified Rankin (Stroke Patients Only)       Balance Overall balance assessment: Needs assistance Sitting-balance support: Feet supported Sitting balance-Leahy Scale: Good     Standing balance support: Bilateral upper extremity supported;During functional activity;Reliant on assistive device for balance Standing balance-Leahy Scale: Poor Standing balance comment: RW and MOD A to steady during transition to standing, to remain standing and during pivot transfer                            Cognition Arousal/Alertness: Awake/alert Behavior During Therapy: Flat affect Overall Cognitive Status: No family/caregiver present to determine baseline cognitive functioning Area of Impairment: Following commands                 Orientation Level:  Person;Place;Time;Situation     Following Commands: Follows one step commands with increased time;Follows multi-step commands inconsistently                Exercises      General Comments        Pertinent Vitals/Pain Pain Assessment: Faces Faces Pain Scale: Hurts whole lot Pain Location: LLE Pain Descriptors / Indicators: Discomfort;Moaning;Crying Pain Intervention(s): Limited  activity within patient's tolerance;Monitored during session;Repositioned    Home Living                          Prior Function            PT Goals (current goals can now be found in the care plan section) Acute Rehab PT Goals PT Goal Formulation: Patient unable to participate in goal setting    Frequency    Min 2X/week      PT Plan      Co-evaluation              AM-PAC PT "6 Clicks" Mobility   Outcome Measure  Help needed turning from your back to your side while in a flat bed without using bedrails?: A Little Help needed moving from lying on your back to sitting on the side of a flat bed without using bedrails?: A Little Help needed moving to and from a bed to a chair (including a wheelchair)?: A Little Help needed standing up from a chair using your arms (e.g., wheelchair or bedside chair)?: A Little Help needed to walk in hospital room?: Total Help needed climbing 3-5 steps with a railing? : Total 6 Click Score: 14    End of Session Equipment Utilized During Treatment: Gait belt Activity Tolerance: Patient limited by pain;Patient tolerated treatment well Patient left: with call bell/phone within reach;in bed;with bed alarm set Nurse Communication: Mobility status PT Visit Diagnosis: Unsteadiness on feet (R26.81);Muscle weakness (generalized) (M62.81);Difficulty in walking, not elsewhere classified (R26.2);Other abnormalities of gait and mobility (R26.89)     Time: 9390-3009 PT Time Calculation (min) (ACUTE ONLY): 12 min  Charges:  $Therapeutic Activity: 8-22 mins                     Basilia Jumbo PT, DPT 03/02/21 12:27 PM [phone]

## 2021-03-02 NOTE — TOC Progression Note (Addendum)
Transition of Care [facility]) - Progression Note    Patient Details  Name: [name] [name] MRN: [id number] Date of Birth: 1965/09/28  Transition of Care [facility] CM/SW Contact  Margarito Liner, LCSW Phone Number: 03/02/2021, 1:00 PM  Clinical Narrative:   [facility] offered a bed which legal guardian accepted. Admissions coordinator will have to confirm with business office because they thought she would be coming in under LTC.  2:52 pm: Left voicemail for [facility] admissions coordinator to see if they will be able to accept her for rehab.  4:11 pm: [facility] is only able to accept patient for LTC. [facility] can accept for short-term rehab but therapy would be pro-bono and would be at the discretion of their therapists. Left voicemail for guardian to notify and see what she wanted to do.  Expected Discharge Plan: Assisted Living Barriers to Discharge: Continued Medical Work up  Expected Discharge Plan and Services Expected Discharge Plan: Assisted Living     Post Acute Care Choice: NA Living arrangements for the past 2 months: Assisted Living Facility                                       Social Determinants of Health (SDOH) Interventions    Readmission Risk Interventions Readmission Risk Prevention Plan 02/23/2021 01/02/2020  Transportation Screening Complete Complete  HRI or Home Care Consult - Complete  Social Work Consult for Recovery Care Planning/Counseling - Complete  Palliative Care Screening - Not Applicable  Medication Review Oceanographer) Complete Complete  PCP or Specialist appointment within 3-5 days of discharge Complete -  SW Recovery Care/Counseling Consult Complete -  Palliative Care Screening Not Applicable -  Skilled Nursing Facility Not Applicable -  Some recent data might be hidden

## 2021-03-02 NOTE — Plan of Care (Signed)

## 2021-03-02 NOTE — Progress Notes (Signed)
[facility] [place] NAME: [name] [name]    MR#:  [id number]  DATE OF BIRTH:  Jun 17, 1965  SUBJECTIVE:   sitting in the chair. Answers yes to all questions. No family at bedside.  Eating BF No new issues per RN REVIEW OF SYSTEMS:   Review of Systems  Unable to perform ROS: Psychiatric disorder  Tolerating Diet:yes Tolerating PT: rehab  DRUG ALLERGIES:   Allergies  Allergen Reactions   Latex Rash    Severe itching.    VITALS:  Blood pressure (!) 181/85, pulse 89, temperature 98.1 F (36.7 C), resp. rate 14, height 5\' 8"  (1.727 m), weight 99.8 kg, SpO2 100 %.  PHYSICAL EXAMINATION:   Physical Exam  GENERAL:  56 y.o.-year-old patient lying in the bed with no acute distress.  HEENT: Head atraumatic, normocephalic. Oropharynx and nasopharynx clear.  LUNGS: Normal breath sounds bilaterally, no wheezing, rales, rhonchi. No use of accessory muscles of respiration.  CARDIOVASCULAR: S1, S2 normal. No murmurs, rubs, or gallops.  ABDOMEN: Soft, nontender, nondistended. Bowel sounds present. No organomegaly or mass.  EXTREMITIES: left lower extremity dressing present. Bilateral lower extremity mild edema. NEUROLOGIC: nonfocal PSYCHIATRIC:  patient is alert  SKIN:  Pressure Injury 02/18/20 Rib Right;Lateral Stage 2 -  Partial thickness loss of dermis presenting as a shallow open injury with a red, pink wound bed without slough. (Active)  02/18/20 1718  Location: Rib  Location Orientation: Right;Lateral  Staging: Stage 2 -  Partial thickness loss of dermis presenting as a shallow open injury with a red, pink wound bed without slough.  Wound Description (Comments):   Present on Admission:      Pressure Injury 02/18/20 Buttocks Right;Lateral Stage 2 -  Partial thickness loss of dermis presenting as a shallow open injury with a red, pink wound bed without slough. (Active)  02/18/20 1719  Location: Buttocks  Location Orientation:  Right;Lateral  Staging: Stage 2 -  Partial thickness loss of dermis presenting as a shallow open injury with a red, pink wound bed without slough.  Wound Description (Comments):   Present on Admission: Yes     LABORATORY PANEL:  CBC Recent Labs  Lab 02/28/21 0759  WBC 11.1*  HGB 8.4*  HCT 26.0*  PLT 495*     Chemistries  Recent Labs  Lab 02/28/21 0759 03/01/21 0457  NA 136  --   K 4.0  --   CL 107  --   CO2 22  --   GLUCOSE 99  --   BUN 47*  --   CREATININE 1.30* 1.38*  CALCIUM 8.0*  --   MG 2.0  --     Cardiac Enzymes No results for input(s): TROPONINI in the last 168 hours. RADIOLOGY:  DG Elbow 2 Views Left  Result Date: 03/01/2021 CLINICAL DATA:  Elbow pain without known injury. EXAM: LEFT ELBOW - 2 VIEW COMPARISON:  None. FINDINGS: There is no evidence of fracture, dislocation, or joint effusion. There is no evidence of arthropathy or other focal bone abnormality. Soft tissues are unremarkable. IMPRESSION: Negative radiographs. Electronically Signed   By: [name] [name] M.D.   On: 03/01/2021 11:05   ASSESSMENT AND PLAN:  56 yo female resident of a group home with the past medical history of HTN, dyslipidemia, CVA, COPD, T2DM, depression and anxiety who presented with left lower extremity rash, pain and edema. Reported worsening rash from the left knee down to her lower extremity for the last 3 to 4 days  leading into her hospitalization, associated with decreased ambulation due to pain and edema.   Left lower extremity cellulitis, complicated with sepsis present on admission  --Clinically improving edema, erythema and pain.  --Wbc is 12.7 and she has been afebrile.  --Cultures with no growth.  --Left lower extremity US with no DVT, positive reactive prominent left inguinal lymph node.  --Continue doxycycline p.o.--total 10 days --Stable for discharge to skilled nursing facility--awaiting final word from legal guardian   HTN Diastolic heart failure (acute on  chronic) --Continue b with amlodipine and metoprolol.  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 Patient is living in an group home.  --On asa and clopidogrel.  --Continue with PT and OT.    CKD stage 3b, hyponatremia.  --Stable renal function     Anemia of chronic disease combined with iron deficiency.  --Hgb has been stable.    Depression and anxiety --PRN lorazepam.  --Scheduled escitalopram.   Obesity class 1  --Calculated BMI is 33,4   T2DM/ dyslipidemia.  --Reasonable glucose control over interval Basal insulin 12 units.  Continue with atorvastatin.        DVT prophylaxis: SQ Lovenox Code Status: Full Family Communication: left msg for legal guardian [name] [name] to call me and spoke with [name] [name] as [place] DSS Disposition Plan: Status is: Inpatient   Remains inpatient appropriate because: Unsafe discharge plan.    Patient stable for discharge to skilled nursing facility According to [facility] she has left message for legal guardian regarding bed offer 1/11- as above waiting for legal guardian   TOTAL TIME TAKING CARE OF THIS PATIENT: 25 minutes.  >50% time spent on counselling and coordination of care  Note: This dictation was prepared with Dragon dictation along with smaller phrase technology. Any transcriptional errors that result from this process are unintentional.  [name] [name] M.D    [facility]   CC: Primary care physician; [name] Patient ID: [name] [name], female   DOB: 04-30-1965, 56 y.o.   MRN: [id number]

## 2021-03-03 LAB — RESP PANEL BY RT-PCR (FLU A&B, COVID) ARPGX2
Influenza A by PCR: NEGATIVE
Influenza B by PCR: NEGATIVE
SARS Coronavirus 2 by RT PCR: NEGATIVE

## 2021-03-03 LAB — GLUCOSE, CAPILLARY
Glucose-Capillary: 105 mg/dL — ABNORMAL HIGH (ref 70–99)
Glucose-Capillary: 136 mg/dL — ABNORMAL HIGH (ref 70–99)
Glucose-Capillary: 148 mg/dL — ABNORMAL HIGH (ref 70–99)

## 2021-03-03 MED ORDER — LORAZEPAM 1 MG PO TABS: 1.0000 mg | ORAL_TABLET | Freq: Two times a day (BID) | ORAL | 0 refills | Status: AC | PRN

## 2021-03-03 MED ORDER — POLYETHYLENE GLYCOL 3350 17 G PO PACK: 17.0000 g | PACK | Freq: Every day | ORAL | 0 refills | Status: AC | PRN

## 2021-03-03 MED ORDER — POLYSACCHARIDE IRON COMPLEX 150 MG PO CAPS: 150.0000 mg | ORAL_CAPSULE | Freq: Every day | ORAL | 0 refills | Status: AC

## 2021-03-03 MED ORDER — DOXYCYCLINE HYCLATE 100 MG PO TABS: 100.0000 mg | ORAL_TABLET | Freq: Two times a day (BID) | ORAL | 0 refills | Status: AC

## 2021-03-03 NOTE — Progress Notes (Signed)
Patient ID: LOA IDLER, female   DOB: [birth year], 56 y.o.   MRN: [id number] Attempted to call report to [facility] x 3. No answer after transferred.  Lidia Collum, RN

## 2021-03-03 NOTE — TOC Progression Note (Addendum)
Transition of Care [facility]) - Progression Note    Patient Details  Name: NNEKA BLANDA MRN: [id number] Date of Birth: 12/19/1965  [facility] CM/SW Contact  Margarito Liner, LCSW Phone Number: 03/03/2021, 8:30 AM  Clinical Narrative: Legal guardian has accepted bed offer from [facility] for long-term care. Left message for admissions coordinator to notify.    9:07 am: [facility] admissions coordinator is checking on bed availability for today.  10:10 am: Admissions coordinator is going to contact [place] regarding paperwork.  Expected Discharge Plan: Assisted Living Barriers to Discharge: Continued Medical Work up  Expected Discharge Plan and Services Expected Discharge Plan: Assisted Living     Post Acute Care Choice: NA Living arrangements for the past 2 months: Assisted Living Facility                                       Social Determinants of Health (SDOH) Interventions    Readmission Risk Interventions Readmission Risk Prevention Plan 02/23/2021 01/02/2020  Transportation Screening Complete Complete  HRI or Home Care Consult - Complete  Social Work Consult for Recovery Care Planning/Counseling - Complete  Palliative Care Screening - Not Applicable  Medication Review Oceanographer) Complete Complete  PCP or Specialist appointment within 3-5 days of discharge Complete -  SW Recovery Care/Counseling Consult Complete -  Palliative Care Screening Not Applicable -  Skilled Nursing Facility Not Applicable -  Some recent data might be hidden

## 2021-03-03 NOTE — Plan of Care (Signed)
Patient ID: [name] [name], female   DOB: 20-Jan-1966, 56 y.o.   MRN: [id number]  Problem: Education: Goal: Knowledge of General Education information will improve Description: Including pain rating scale, medication(s)/side effects and non-pharmacologic comfort measures Outcome: Adequate for Discharge   Problem: Health Behavior/Discharge Planning: Goal: Ability to manage health-related needs will improve Outcome: Adequate for Discharge   Problem: Clinical Measurements: Goal: Ability to maintain clinical measurements within normal limits will improve Outcome: Adequate for Discharge Goal: Will remain free from infection Outcome: Adequate for Discharge Goal: Diagnostic test results will improve Outcome: Adequate for Discharge Goal: Respiratory complications will improve Outcome: Adequate for Discharge Goal: Cardiovascular complication will be avoided Outcome: Adequate for Discharge   Problem: Activity: Goal: Risk for activity intolerance will decrease Outcome: Adequate for Discharge   Problem: Nutrition: Goal: Adequate nutrition will be maintained Outcome: Adequate for Discharge   Problem: Coping: Goal: Level of anxiety will decrease Outcome: Adequate for Discharge   Problem: Elimination: Goal: Will not experience complications related to bowel motility Outcome: Adequate for Discharge Goal: Will not experience complications related to urinary retention Outcome: Adequate for Discharge   Problem: Pain Managment: Goal: General experience of comfort will improve Outcome: Adequate for Discharge   Problem: Safety: Goal: Ability to remain free from injury will improve Outcome: Adequate for Discharge   Problem: Skin Integrity: Goal: Risk for impaired skin integrity will decrease Outcome: Adequate for Discharge   Problem: Increased Nutrient Needs (NI-5.1) Goal: Food and/or nutrient delivery Description: Individualized approach for food/nutrient provision. Outcome:  Adequate for Discharge   Problem: Acute Rehab OT Goals (only OT should resolve) Goal: Pt. Will Perform Grooming Outcome: Adequate for Discharge Goal: Pt. Will Perform Lower Body Dressing Outcome: Adequate for Discharge Goal: Pt. Will Transfer To Toilet Outcome: Adequate for Discharge Goal: Pt. Will Perform Toileting-Clothing Manipulation Outcome: Adequate for Discharge   Problem: Acute Rehab PT Goals(only PT should resolve) Goal: Pt Will Go Supine/Side To Sit Outcome: Adequate for Discharge Goal: Patient Will Transfer Sit To/From Stand Outcome: Adequate for Discharge Goal: Pt Will Ambulate Outcome: Adequate for Discharge    Lidia Collum, RN

## 2021-03-03 NOTE — TOC Transition Note (Signed)
Transition of Care [facility]) - CM/SW Discharge Note   Patient Details  Name: [name] [name] MRN: [id number] Date of Birth: 10/21/1965  Transition of Care [facility]) CM/SW Contact:  Margarito Liner, LCSW Phone Number: 03/03/2021, 2:52 PM   Clinical Narrative:   Patient has orders to discharge to [facility] SNF today. RN will call report to [phone] (Room 203B). EMS transport has been arranged and she is 5th on the list. Patient is aware and agreeable to plan. No further concerns. CSW signing off.  Final next level of care: Skilled Nursing Facility Barriers to Discharge: Barriers Resolved   Patient Goals and CMS Choice     Choice offered to / list presented to : [facility] POA / Guardian  Discharge Placement PASRR number recieved: 02/26/21            [facility] Patient to be transferred to facility by: EMS Name of family member notified: [place]cha McCollugh Patient and family notified of of transfer: 03/03/21  Discharge Plan and Services     Post Acute Care Choice: NA                               Social Determinants of Health (SDOH) Interventions     Readmission Risk Interventions Readmission Risk Prevention Plan 02/23/2021 01/02/2020  Transportation Screening Complete Complete  HRI or Home Care Consult - Complete  Social Work Consult for Recovery Care Planning/Counseling - Complete  Palliative Care Screening - Not Applicable  Medication Review Oceanographer) Complete Complete  PCP or Specialist appointment within 3-5 days of discharge Complete -  SW Recovery Care/Counseling Consult Complete -  Palliative Care Screening Not Applicable -  Skilled Nursing Facility Not Applicable -  Some recent data might be hidden

## 2021-03-03 NOTE — Progress Notes (Signed)
Patient ID: [name] [name], female   DOB: Jul 11, 1965, 56 y.o.   MRN: [id number] Report called to [facility] and given to [employer].   Lidia Collum, RN

## 2021-03-03 NOTE — Progress Notes (Addendum)
[place] [facility] [place] NAME: [name] [name]    MR#:  [id number]  DATE OF BIRTH:  [birth year]  SUBJECTIVE:   Resting comfortably. No new issues per RN Eating well REVIEW OF SYSTEMS:   Review of Systems  Unable to perform ROS: Psychiatric disorder  Tolerating Diet:yes Tolerating PT: rehab  DRUG ALLERGIES:   Allergies  Allergen Reactions   Latex Rash    Severe itching.    VITALS:  Blood pressure (!) 170/80, pulse 85, temperature 98.4 F (36.9 C), temperature source Oral, resp. rate 18, height 5\' 8"  (1.727 m), weight 99.8 kg, SpO2 100 %.  PHYSICAL EXAMINATION:   Physical Exam  GENERAL:  56 y.o.-year-old patient lying in the bed with no acute distress.  LUNGS: Normal breath sounds bilaterally, no wheezing, rales, rhonchi. No use of accessory muscles of respiration.  CARDIOVASCULAR: S1, S2 normal. No murmurs, rubs, or gallops.  ABDOMEN: Soft, nontender, nondistended. Bowel sounds present.  EXTREMITIES: left lower extremity dressing present. Bilateral lower extremity mild edema. NEUROLOGIC: nonfocal PSYCHIATRIC:  patient is alert  SKIN:  Pressure Injury 02/18/20 Rib Right;Lateral Stage 2 -  Partial thickness loss of dermis presenting as a shallow open injury with a red, pink wound bed without slough. (Active)  02/18/20 1718  Location: Rib  Location Orientation: Right;Lateral  Staging: Stage 2 -  Partial thickness loss of dermis presenting as a shallow open injury with a red, pink wound bed without slough.  Wound Description (Comments):   Present on Admission:      Pressure Injury 02/18/20 Buttocks Right;Lateral Stage 2 -  Partial thickness loss of dermis presenting as a shallow open injury with a red, pink wound bed without slough. (Active)  02/18/20 1719  Location: Buttocks  Location Orientation: Right;Lateral  Staging: Stage 2 -  Partial thickness loss of dermis presenting as a shallow open injury with a red, pink wound bed  without slough.  Wound Description (Comments):   Present on Admission: Yes     LABORATORY PANEL:  CBC Recent Labs  Lab 02/28/21 0759  WBC 11.1*  HGB 8.4*  HCT 26.0*  PLT 495*     Chemistries  Recent Labs  Lab 02/28/21 0759 03/01/21 0457  NA 136  --   K 4.0  --   CL 107  --   CO2 22  --   GLUCOSE 99  --   BUN 47*  --   CREATININE 1.30* 1.38*  CALCIUM 8.0*  --   MG 2.0  --     Cardiac Enzymes No results for input(s): TROPONINI in the last 168 hours. RADIOLOGY:  DG Elbow 2 Views Left  Result Date: 03/01/2021 CLINICAL DATA:  Elbow pain without known injury. EXAM: LEFT ELBOW - 2 VIEW COMPARISON:  None. FINDINGS: There is no evidence of fracture, dislocation, or joint effusion. There is no evidence of arthropathy or other focal bone abnormality. Soft tissues are unremarkable. IMPRESSION: Negative radiographs. Electronically Signed   By: Nelson Chimes [name].   On: 03/01/2021 11:05   ASSESSMENT AND PLAN:  56 yo female resident of a group home with the past medical history of HTN, dyslipidemia, CVA, COPD, T2DM, depression and anxiety who presented with left lower extremity rash, pain and edema. Reported worsening rash from the left knee down to her lower extremity for the last 3 to 4 days leading into her hospitalization, associated with decreased ambulation due to pain and edema.   Left lower extremity cellulitis, complicated with sepsis  present on admission  --Clinically improving edema, erythema and pain.  --Wbc is 12.7 and she has been afebrile.  --Cultures with no growth.  --Left lower extremity US with no DVT, positive reactive prominent left inguinal lymph node.  --Continue doxycycline p.o.--total 10 days --Stable for discharge to skilled nursing facility--legal guardian guardian has [facility] for L[facility] per TOC   HTN Diastolic heart failure (acute on chronic) --Continue b with amlodipine and metoprolol.  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 Patient is living in an group home.  --On asa and clopidogrel.  --Continue with PT and OT.    CKD stage 3b, hyponatremia.  --Stable renal function     Anemia of chronic disease combined with iron deficiency.  --Hgb has been stable.    Depression and anxiety --PRN lorazepam.  --Scheduled escitalopram.   Obesity class 1  --Calculated BMI is 33,4   T2DM/ dyslipidemia.  --Reasonable glucose control over interval Basal insulin 12 units.  Continue with atorvastatin.        DVT prophylaxis: SQ Lovenox Code Status: Full Family Communication: spoke with legal guardian--accepted bed at [facility] Disposition Plan: Status is: Inpatient   Remains inpatient appropriate because: Unsafe discharge plan.    Patient stable for discharge to skilled nursing facility According to [facility] she has left message for legal guardian regarding bed offer 1/11- as above waiting for legal guardian 1/12--accepted bed at [place]: 25 minutes.  >50% time spent on counselling and coordination of care  Note: This dictation was prepared with Dragon dictation along with smaller phrase technology. Any transcriptional errors that result from this process are unintentional.  [name] [name]    [facility]   CC: Primary care physician; [facility] Patient ID: [name] [name], female   DOB: March 04, 1965, 56 y.o.   MRN: [id number]

## 2021-03-03 NOTE — Discharge Summary (Signed)
[facility] NAME: [name] [name]    MR#:  [id number]  DATE OF BIRTH:  1966-01-07  DATE OF ADMISSION:  02/22/2021 ADMITTING PHYSICIAN: Val Riles, MD  DATE OF DISCHARGE: 03/03/2021  PRIMARY CARE PHYSICIAN: [place], [facility]    ADMISSION DIAGNOSIS:  Cellulitis of left lower extremity [L03.116] Left leg cellulitis [L03.116] Sepsis due to cellulitis (HCC) [L03.90, A41.9]  DISCHARGE DIAGNOSIS:  sepsis secondary left lower extremity cellulitis improved  SECONDARY DIAGNOSIS:   Past Medical History:  Diagnosis Date   Acute cystitis    Anxiety    Arthritis    CVA (cerebral vascular accident) ([place])    Depression    Diabetes mellitus without complication ([place])    Dyspnea    Fecal incontinence    Hypertension    Incontinence    Incontinence of urine    Stroke ([place]) 2004   x 2   Urinary incontinence     HOSPITAL COURSE:   56 yo female resident of a group home with the past medical history of HTN, dyslipidemia, CVA, COPD, T2DM, depression and anxiety who presented with left lower extremity rash, pain and edema. Reported worsening rash from the left knee down to her lower extremity for the last 3 to 4 days leading into her hospitalization, associated with decreased ambulation due to pain and edema.    Left lower extremity cellulitis, complicated with sepsis present on admission  --Clinically improving edema, erythema and pain.  --Wbc is 12.7 and she has been afebrile.  --Cultures with no growth.  --Left lower extremity US with no DVT, positive reactive prominent left inguinal lymph node.  --Continue doxycycline p.o.--total 10 days --Stable for discharge to skilled nursing facility--legal guardian guardian has accepted bed at [facility] for LTC per TOC   HTN Diastolic heart failure (acute on chronic) --Continue b with amlodipine and metoprolol.  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 Patient is  living in an group home.  --On asa and clopidogrel.  --Continue with PT and OT.    CKD stage 3b, hyponatremia.  --Stable renal function     Anemia of chronic disease combined with iron deficiency.  --Hgb has been stable.    Depression and anxiety --PRN lorazepam.  --Scheduled escitalopram.   Obesity class 1  --Calculated BMI is 33,4   T2DM/ dyslipidemia.  --Reasonable glucose control over interval Basal insulin 12 units.  Continue with atorvastatin.        DVT prophylaxis: SQ Lovenox Code Status: Full Family Communication: spoke with legal guardian--accepted bed at [facility] Disposition Plan: Status is: Inpatient  discharge patient to [facility]   CONSULTS OBTAINED:    DRUG ALLERGIES:   Allergies  Allergen Reactions   Latex Rash    Severe itching.    DISCHARGE MEDICATIONS:   Allergies as of 03/03/2021       Reactions   Latex Rash   Severe itching.        Medication List     STOP taking these medications    pantoprazole 40 MG tablet Commonly known as: PROTONIX   sodium chloride 1 g tablet       TAKE these medications    acetaminophen 650 MG CR tablet Commonly known as: TYLENOL Take 650 mg by mouth every 8 (eight) hours as needed for pain.   albuterol 108 (90 Base) MCG/ACT inhaler Commonly known as: VENTOLIN HFA Inhale 2 puffs into the lungs every 4 (four)  hours as needed for wheezing or shortness of breath.   amLODipine 10 MG tablet Commonly known as: NORVASC Take 10 mg by mouth daily. What changed: Another medication with the same name was removed. Continue taking this medication, and follow the directions you see here.   aspirin EC 81 MG tablet Take 81 mg by mouth daily. Swallow whole.   atorvastatin 40 MG tablet Commonly known as: LIPITOR Take 40 mg by mouth at bedtime. (2000)   cholecalciferol 25 MCG (1000 UNIT) tablet Commonly known as: VITAMIN D3 Take 1,000 Units by mouth every morning.   clopidogrel 75 MG tablet Commonly  known as: PLAVIX Take 1 tablet (75 mg total) by mouth daily with breakfast.   dexlansoprazole 60 MG capsule Commonly known as: DEXILANT Take 1 capsule by mouth daily.   diclofenac sodium 1 % Gel Commonly known as: VOLTAREN Apply 2 g topically 4 (four) times daily as needed. What changed: when to take this   doxycycline 100 MG tablet Commonly known as: VIBRA-TABS Take 1 tablet (100 mg total) by mouth every 12 (twelve) hours for 2 days.   escitalopram 10 MG tablet Commonly known as: LEXAPRO Take 10 mg by mouth daily.   ferrous sulfate 325 (65 FE) MG tablet Take 1 tablet (325 mg total) by mouth 2 (two) times daily with a meal. What changed: when to take this   Fluticasone-Salmeterol 100-50 MCG/DOSE Aepb Commonly known as: ADVAIR Inhale 1 puff into the lungs 2 (two) times daily.   furosemide 20 MG tablet Commonly known as: LASIX Take 1 tablet (20 mg total) by mouth 2 (two) times daily. (0800) What changed: when to take this   iron polysaccharides 150 MG capsule Commonly known as: NIFEREX Take 1 capsule (150 mg total) by mouth daily. Start taking on: March 04, 2021   Lantus SoloStar 100 UNIT/ML Solostar Pen Generic drug: insulin glargine Inject 12 Units into the skin at bedtime.   LORazepam 1 MG tablet Commonly known as: ATIVAN Take 1 tablet (1 mg total) by mouth 2 (two) times daily as needed for anxiety.   megestrol 40 MG tablet Commonly known as: MEGACE Take 40 mg by mouth daily.   metoprolol tartrate 25 MG tablet Commonly known as: LOPRESSOR Take 25 mg by mouth daily.   multivitamin with minerals tablet Take 1 tablet by mouth daily.   polyethylene glycol 17 g packet Commonly known as: MIRALAX / GLYCOLAX Take 17 g by mouth daily as needed for mild constipation.               Discharge Care Instructions  (From admission, onward)           Start     Ordered   03/03/21 0000  Discharge wound care:       Comments: Wound care  Daily       Comments: Place as many Xeroform gauzes over intact LLE fluid filled and ruptured open areas, as necessary to cover all of them. Then place ABD pads over all "blisters" and weeping areas. Beginning behind the toes and going to just below the knee, spiral wrap kerlix. Change daily and prn soilage/saturation.  02/22/21 1206   03/03/21 1336            If you experience worsening of your admission symptoms, develop shortness of breath, life threatening emergency, suicidal or homicidal thoughts you must seek medical attention immediately by calling 911 or calling your MD immediately  if symptoms less severe.  You Must read complete instructions/literature  along with all the possible adverse reactions/side effects for all the Medicines you take and that have been prescribed to you. Take any new Medicines after you have completely understood and accept all the possible adverse reactions/side effects.   Please note  You were cared for by a hospitalist during your hospital stay. If you have any questions about your discharge medications or the care you received while you were in the hospital after you are discharged, you can call the unit and asked to speak with the hospitalist on call if the hospitalist that took care of you is not available. Once you are discharged, your primary care physician will handle any further medical issues. Please note that NO REFILLS for any discharge medications will be authorized once you are discharged, as it is imperative that you return to your primary care physician (or establish a relationship with a primary care physician if you do not have one) for your aftercare needs so that they can reassess your need for medications and monitor your lab values.  DATA REVIEW:   CBC  Recent Labs  Lab 02/28/21 0759  WBC 11.1*  HGB 8.4*  HCT 26.0*  PLT 495*    Chemistries  Recent Labs  Lab 02/28/21 0759 03/01/21 0457  NA 136  --   K 4.0  --   CL 107  --   CO2 22   --   GLUCOSE 99  --   BUN 47*  --   CREATININE 1.30* 1.38*  CALCIUM 8.0*  --   MG 2.0  --     Microbiology Results   Recent Results (from the past 240 hour(s))  Blood culture (routine x 2)     Status: None   Collection Time: 02/22/21  1:33 AM   Specimen: BLOOD  Result Value Ref Range Status   Specimen Description BLOOD LEFT FOREARM  Final   Special Requests   Final    BOTTLES DRAWN AEROBIC AND ANAEROBIC Blood Culture results may not be optimal due to an inadequate volume of blood received in culture bottles   Culture   Final    NO GROWTH 5 DAYS Performed at [facility], [address] [place], [place] [address]    Report Status 02/27/2021 FINAL  Final  Blood culture (routine x 2)     Status: None   Collection Time: 02/22/21  5:16 AM   Specimen: BLOOD  Result Value Ref Range Status   Specimen Description BLOOD BLOOD RIGHT WRIST  Final   Special Requests   Final    BOTTLES DRAWN AEROBIC AND ANAEROBIC Blood Culture adequate volume   Culture   Final    NO GROWTH 5 DAYS Performed at [facility], [address] [place], [place] [address]    Report Status 02/27/2021 FINAL  Final  Resp Panel by RT-PCR (Flu A&B, Covid) Nasopharyngeal Swab     Status: None   Collection Time: 02/22/21  8:40 AM   Specimen: Nasopharyngeal Swab; Nasopharyngeal(NP) swabs in vial transport medium  Result Value Ref Range Status   SARS Coronavirus 2 by RT PCR NEGATIVE NEGATIVE Final    Comment: (NOTE) SARS-CoV-2 target nucleic acids are NOT DETECTED.  The SARS-CoV-2 RNA is generally detectable in upper respiratory specimens during the acute phase of infection. The lowest concentration of SARS-CoV-2 viral copies this assay can detect is 138 copies/mL. A negative result does not preclude SARS-Cov-2 infection and should not be used as the sole basis for treatment or other patient management  decisions. A negative result may occur with  improper specimen collection/handling,  submission of specimen other than nasopharyngeal swab, presence of viral mutation(s) within the areas targeted by this assay, and inadequate number of viral copies(<138 copies/mL). A negative result must be combined with clinical observations, patient history, and epidemiological information. The expected result is Negative.  Fact Sheet for Patients:  BloggerCourse.com  Fact Sheet for Healthcare Providers:  SeriousBroker.it  This test is no t yet approved or cleared by the [place] FDA and  has been authorized for detection and/or diagnosis of SARS-CoV-2 by FDA under an Emergency Use Authorization (EUA). This EUA will remain  in effect (meaning this test can be used) for the duration of the COVID-19 declaration under Section 564(b)(1) of the Act, 21 U.S.C.section 360bbb-3(b)(1), unless the authorization is terminated  or revoked sooner.       Influenza A by PCR NEGATIVE NEGATIVE Final   Influenza B by PCR NEGATIVE NEGATIVE Final    Comment: (NOTE) The Xpert Xpress SARS-CoV-2/FLU/RSV plus assay is intended as an aid in the diagnosis of influenza from Nasopharyngeal swab specimens and should not be used as a sole basis for treatment. Nasal washings and aspirates are unacceptable for Xpert Xpress SARS-CoV-2/FLU/RSV testing.  Fact Sheet for Patients: BloggerCourse.com  Fact Sheet for Healthcare Providers: SeriousBroker.it  This test is not yet approved or cleared by the [place] FDA and has been authorized for detection and/or diagnosis of SARS-CoV-2 by FDA under an Emergency Use Authorization (EUA). This EUA will remain in effect (meaning this test can be used) for the duration of the COVID-19 declaration under Section 564(b)(1) of the Act, 21 U.S.C. section 360bbb-3(b)(1), unless the authorization is terminated or revoked.  Performed at [facility], [address] [place], [place] [address]   MRSA Next Gen by PCR, Nasal     Status: None   Collection Time: 02/28/21  5:55 PM   Specimen: Nasal Mucosa; Nasal Swab  Result Value Ref Range Status   MRSA by PCR Next Gen NOT DETECTED NOT DETECTED Final    Comment: (NOTE) The GeneXpert MRSA Assay (FDA approved for NASAL specimens only), is one component of a comprehensive MRSA colonization surveillance program. It is not intended to diagnose MRSA infection nor to guide or monitor treatment for MRSA infections. Test performance is not FDA approved in patients less than 67 years old. Performed at [facility], [address] [place], [place] [address]   Resp Panel by RT-PCR (Flu A&B, Covid) Nasopharyngeal Swab     Status: None   Collection Time: 03/03/21 12:20 PM   Specimen: Nasopharyngeal Swab; Nasopharyngeal(NP) swabs in vial transport medium  Result Value Ref Range Status   SARS Coronavirus 2 by RT PCR NEGATIVE NEGATIVE Final    Comment: (NOTE) SARS-CoV-2 target nucleic acids are NOT DETECTED.  The SARS-CoV-2 RNA is generally detectable in upper respiratory specimens during the acute phase of infection. The lowest concentration of SARS-CoV-2 viral copies this assay can detect is 138 copies/mL. A negative result does not preclude SARS-Cov-2 infection and should not be used as the sole basis for treatment or other patient management decisions. A negative result may occur with  improper specimen collection/handling, submission of specimen other than nasopharyngeal swab, presence of viral mutation(s) within the areas targeted by this assay, and inadequate number of viral copies(<138 copies/mL). A negative result must be combined with clinical observations, patient history, and epidemiological information. The expected result is Negative.  Fact Sheet for Patients:  EntrepreneurPulse.com.au  Fact Sheet for Healthcare Providers:   IncredibleEmployment.be  This test is no t yet approved or cleared by the [place] FDA and  has been authorized for detection and/or diagnosis of SARS-CoV-2 by FDA under an Emergency Use Authorization (EUA). This EUA will remain  in effect (meaning this test can be used) for the duration of the COVID-19 declaration under Section 564(b)(1) of the Act, 21 U.S.C.section 360bbb-3(b)(1), unless the authorization is terminated  or revoked sooner.       Influenza A by PCR NEGATIVE NEGATIVE Final   Influenza B by PCR NEGATIVE NEGATIVE Final    Comment: (NOTE) The Xpert Xpress SARS-CoV-2/FLU/RSV plus assay is intended as an aid in the diagnosis of influenza from Nasopharyngeal swab specimens and should not be used as a sole basis for treatment. Nasal washings and aspirates are unacceptable for Xpert Xpress SARS-CoV-2/FLU/RSV testing.  Fact Sheet for Patients: EntrepreneurPulse.com.au  Fact Sheet for Healthcare Providers: IncredibleEmployment.be  This test is not yet approved or cleared by the [place] FDA and has been authorized for detection and/or diagnosis of SARS-CoV-2 by FDA under an Emergency Use Authorization (EUA). This EUA will remain in effect (meaning this test can be used) for the duration of the COVID-19 declaration under Section 564(b)(1) of the Act, 21 U.S.C. section 360bbb-3(b)(1), unless the authorization is terminated or revoked.  Performed at [facility], [address], [place], [place] [address]     RADIOLOGY:  No results found.   CODE STATUS:     Code Status Orders  (From admission, onward)           Start     Ordered   02/22/21 1754  Full code  Continuous        02/22/21 1754           Code Status History     Date Active Date Inactive Code Status Order ID Comments User Context   02/18/2020 2031 02/20/2020 0011 Full Code [id number]  Varney Biles, MD ED    01/01/2020 2318 01/07/2020 1936 Full Code [id number]  [place], [place], DO ED   10/20/2019 2039 10/22/2019 2023 Full Code [id number]  [place], Warnell Bureau, MD ED   09/08/2018 0426 09/13/2018 2035 Full Code [id number]  Tyna Jaksch, MD Inpatient   10/03/2017 1756 10/05/2017 1911 Full Code [id number]  Lovell Sheehan, MD Inpatient        TOTAL TIME TAKING CARE OF THIS PATIENT: 40 minutes.    Fritzi Mandes M.D  [facility]    CC: Primary care physician; [facility]

## 2021-04-26 ENCOUNTER — Other Ambulatory Visit: Payer: Self-pay | Admitting: Family Medicine

## 2021-04-26 ENCOUNTER — Other Ambulatory Visit (HOSPITAL_COMMUNITY): Payer: Self-pay | Admitting: Family Medicine

## 2021-04-26 DIAGNOSIS — M542 Cervicalgia: Secondary | ICD-10-CM

## 2021-04-29 ENCOUNTER — Ambulatory Visit
Admission: RE | Admit: 2021-04-29 | Discharge: 2021-04-29 | Disposition: A | Discharge status: Home / Self Care | Payer: Medicaid Other | Source: Ambulatory Visit | Attending: Family Medicine | Admitting: Family Medicine

## 2021-04-29 ENCOUNTER — Ambulatory Visit: Payer: Medicaid Other

## 2021-04-29 ENCOUNTER — Other Ambulatory Visit: Payer: Self-pay

## 2021-04-29 DIAGNOSIS — M542 Cervicalgia: Secondary | ICD-10-CM

## 2021-07-04 ENCOUNTER — Other Ambulatory Visit: Payer: Self-pay | Admitting: Nephrology

## 2021-07-04 DIAGNOSIS — N1831 Chronic kidney disease, stage 3a: Secondary | ICD-10-CM

## 2021-07-04 DIAGNOSIS — E1122 Type 2 diabetes mellitus with diabetic chronic kidney disease: Secondary | ICD-10-CM

## 2021-11-16 IMAGING — DX DG TIBIA/FIBULA 2V*L*
4 series · 4 of 4 positions shown · non-contrast
Comparison: X-ray dated September 22, 2020

CLINICAL DATA: Fall

EXAM:
LEFT TIBIA AND FIBULA - 2 VIEW

[tibia ap (1 of 2)]
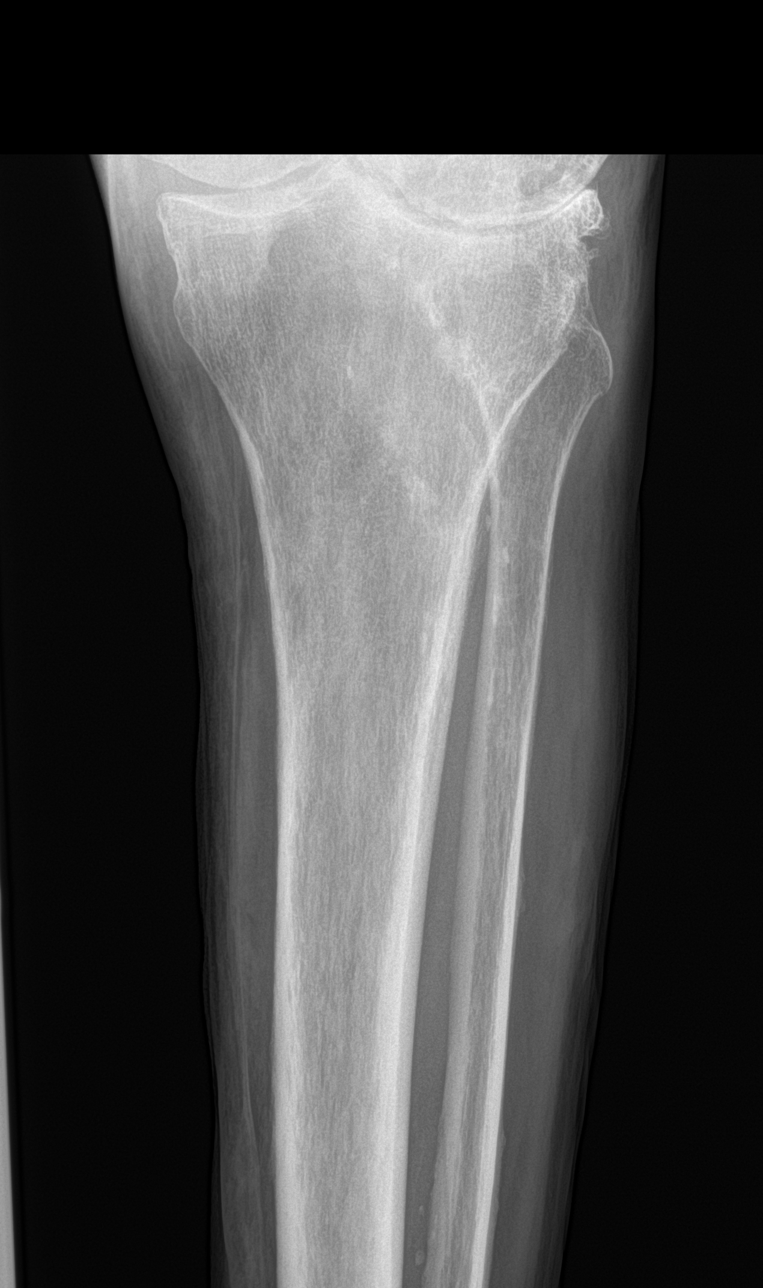

[tibia ap (2 of 2)]
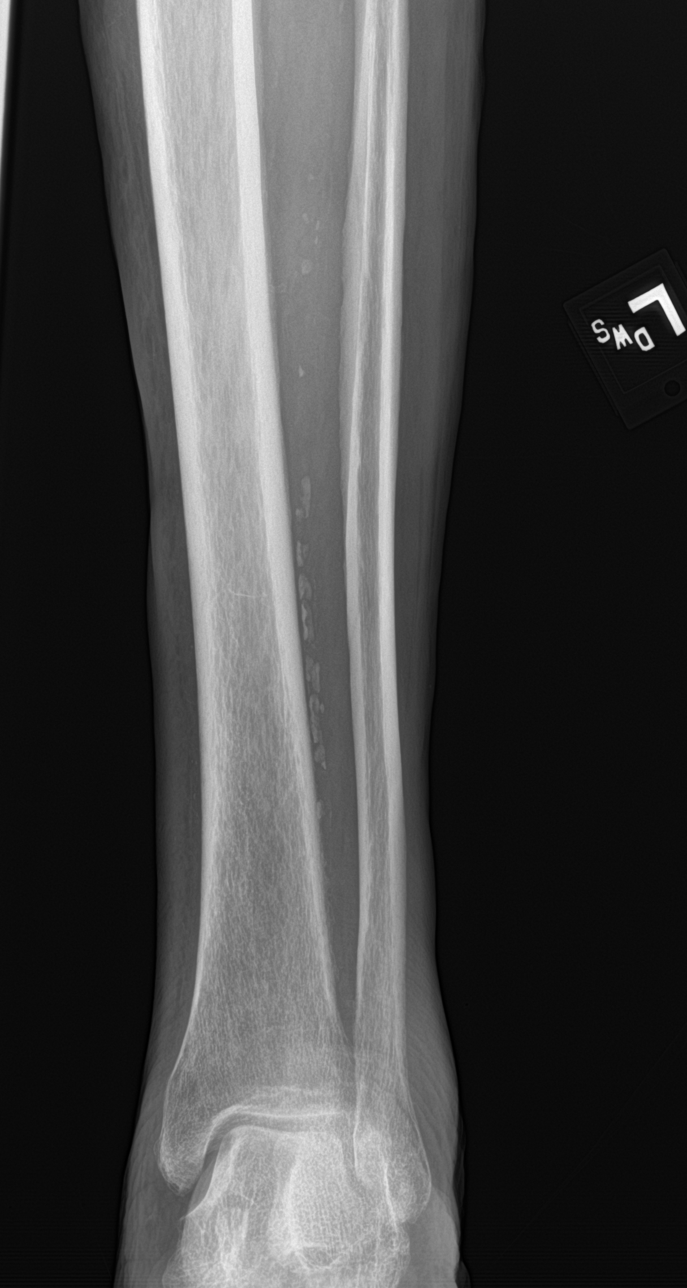

[tibia lat (1 of 2)]
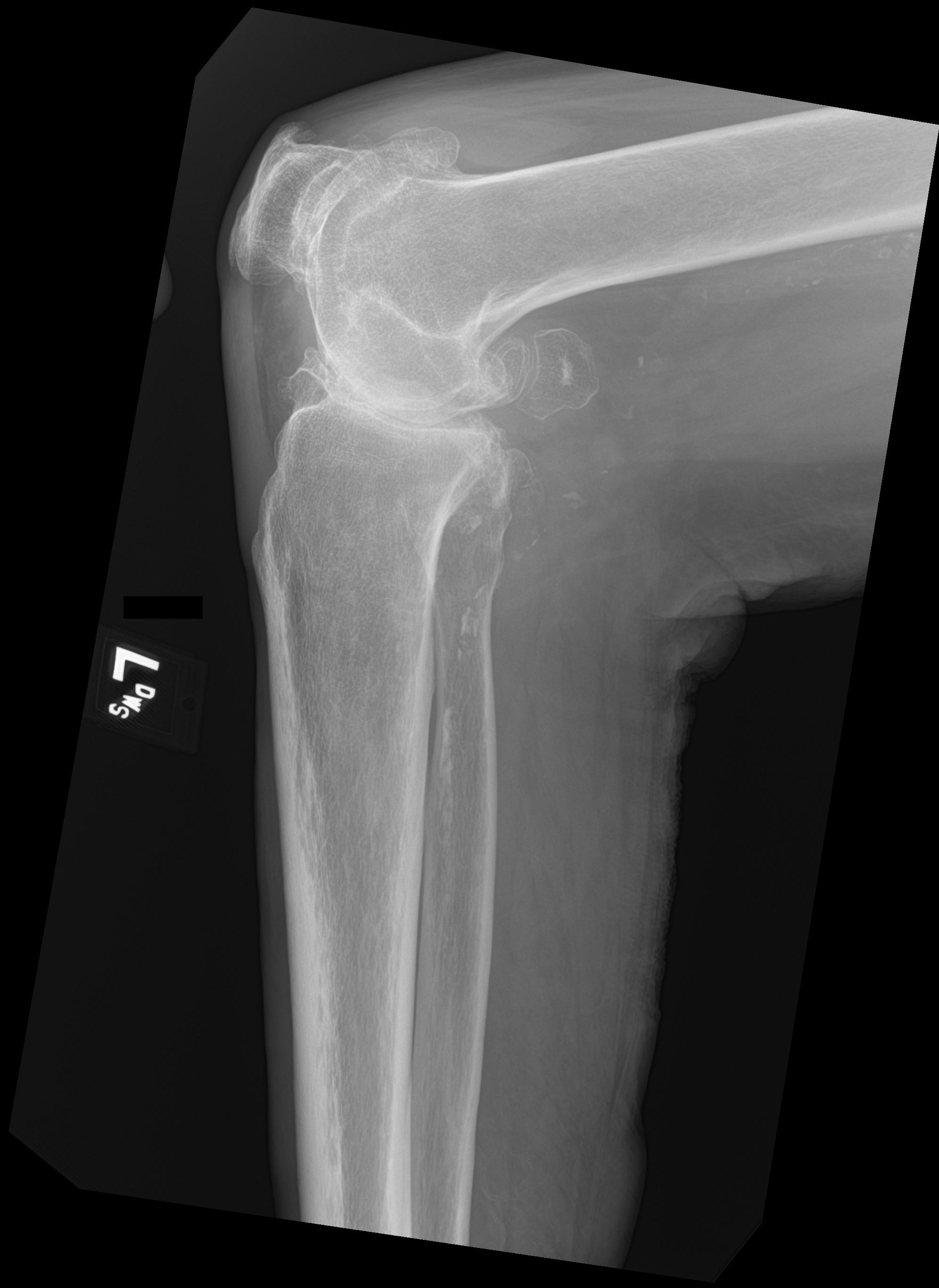

[tibia lat (2 of 2)]
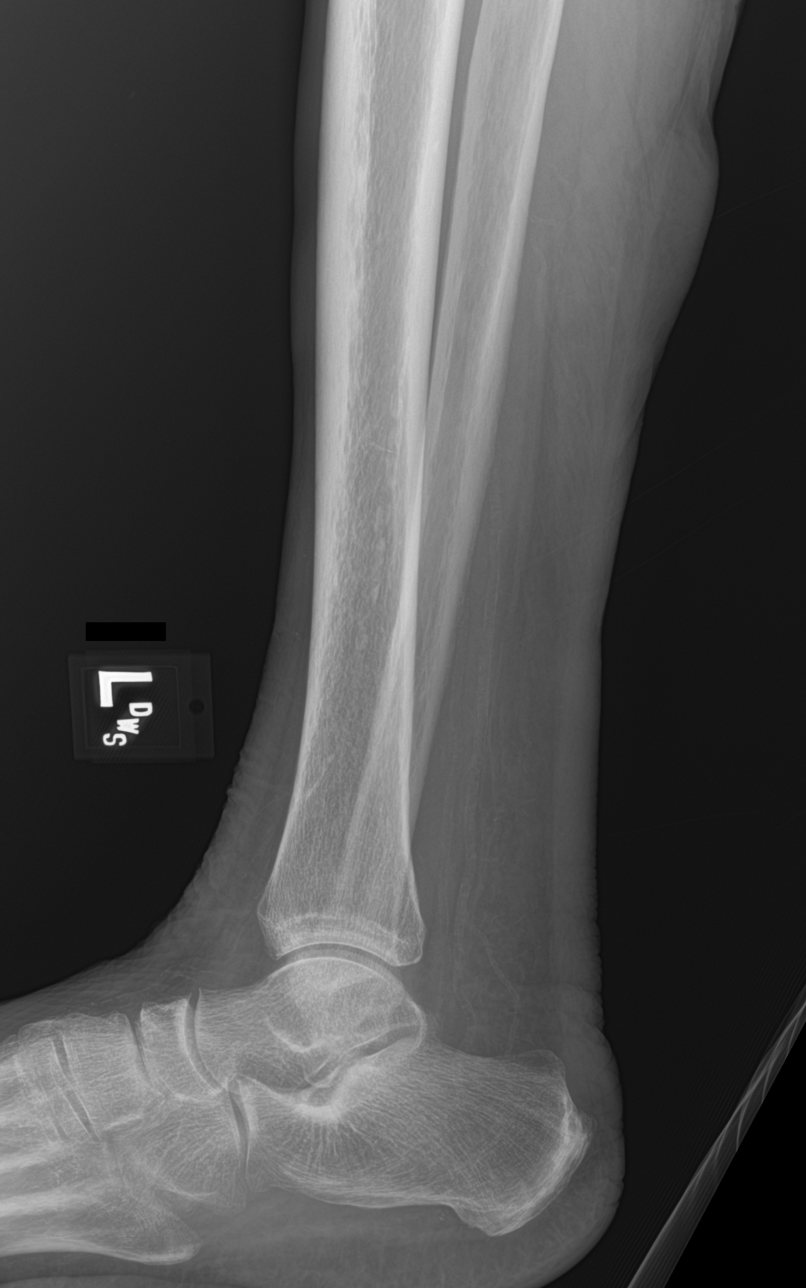

[4 of 4 positions shown; findings below may reference images not displayed]

FINDINGS: No acute fracture or dislocation. Advanced degenerative changes of
the knee, most pronounced at the medial and patellofemoral
compartment. Trace left knee joint effusion. Vascular
calcifications. Soft tissues are otherwise unremarkable.
IMPRESSION: No acute osseous abnormality.

## 2022-07-01 ENCOUNTER — Other Ambulatory Visit: Payer: Self-pay | Admitting: Nephrology

## 2022-07-01 DIAGNOSIS — N1831 Chronic kidney disease, stage 3a: Secondary | ICD-10-CM

## 2022-07-01 DIAGNOSIS — E1122 Type 2 diabetes mellitus with diabetic chronic kidney disease: Secondary | ICD-10-CM
